# Patient Record
Sex: Male | Born: 1960
Health system: Southern US, Community
[De-identification: ages and names within clinical notes are randomized; demographics above are authoritative.]

## PROBLEM LIST (undated history)

## (undated) DIAGNOSIS — I509 Heart failure, unspecified: Secondary | ICD-10-CM

## (undated) DIAGNOSIS — K219 Gastro-esophageal reflux disease without esophagitis: Secondary | ICD-10-CM

## (undated) DIAGNOSIS — F17200 Nicotine dependence, unspecified, uncomplicated: Secondary | ICD-10-CM

## (undated) DIAGNOSIS — E8881 Metabolic syndrome: Secondary | ICD-10-CM

## (undated) DIAGNOSIS — G4730 Sleep apnea, unspecified: Secondary | ICD-10-CM

## (undated) DIAGNOSIS — Z87442 Personal history of urinary calculi: Secondary | ICD-10-CM

## (undated) DIAGNOSIS — I219 Acute myocardial infarction, unspecified: Secondary | ICD-10-CM

## (undated) DIAGNOSIS — E785 Hyperlipidemia, unspecified: Secondary | ICD-10-CM

## (undated) DIAGNOSIS — Z95810 Presence of automatic (implantable) cardiac defibrillator: Secondary | ICD-10-CM

## (undated) DIAGNOSIS — I2510 Atherosclerotic heart disease of native coronary artery without angina pectoris: Secondary | ICD-10-CM

## (undated) DIAGNOSIS — Z972 Presence of dental prosthetic device (complete) (partial): Secondary | ICD-10-CM

## (undated) HISTORY — PX: CARDIAC CATHETERIZATION: SHX172

---

## 2013-12-28 ENCOUNTER — Emergency Department (HOSPITAL_COMMUNITY): Payer: BC Managed Care – PPO

## 2013-12-28 ENCOUNTER — Inpatient Hospital Stay (HOSPITAL_COMMUNITY)
Admission: EM | Admit: 2013-12-28 | Discharge: 2013-12-30 | DRG: 247 | Disposition: A | Discharge status: Home / Self Care | Payer: BC Managed Care – PPO | Attending: Internal Medicine | Admitting: Internal Medicine

## 2013-12-28 ENCOUNTER — Encounter (HOSPITAL_COMMUNITY): Payer: Self-pay | Admitting: Emergency Medicine

## 2013-12-28 DIAGNOSIS — K219 Gastro-esophageal reflux disease without esophagitis: Secondary | ICD-10-CM | POA: Diagnosis present

## 2013-12-28 DIAGNOSIS — R9431 Abnormal electrocardiogram [ECG] [EKG]: Secondary | ICD-10-CM | POA: Diagnosis present

## 2013-12-28 DIAGNOSIS — R079 Chest pain, unspecified: Secondary | ICD-10-CM

## 2013-12-28 DIAGNOSIS — Z955 Presence of coronary angioplasty implant and graft: Secondary | ICD-10-CM

## 2013-12-28 DIAGNOSIS — I200 Unstable angina: Secondary | ICD-10-CM | POA: Diagnosis present

## 2013-12-28 DIAGNOSIS — Z833 Family history of diabetes mellitus: Secondary | ICD-10-CM

## 2013-12-28 DIAGNOSIS — F17200 Nicotine dependence, unspecified, uncomplicated: Secondary | ICD-10-CM | POA: Diagnosis present

## 2013-12-28 DIAGNOSIS — I2510 Atherosclerotic heart disease of native coronary artery without angina pectoris: Principal | ICD-10-CM | POA: Diagnosis not present

## 2013-12-28 DIAGNOSIS — I519 Heart disease, unspecified: Secondary | ICD-10-CM | POA: Diagnosis present

## 2013-12-28 DIAGNOSIS — E8881 Metabolic syndrome: Secondary | ICD-10-CM | POA: Diagnosis present

## 2013-12-28 DIAGNOSIS — Z7982 Long term (current) use of aspirin: Secondary | ICD-10-CM

## 2013-12-28 DIAGNOSIS — Z9861 Coronary angioplasty status: Secondary | ICD-10-CM | POA: Diagnosis not present

## 2013-12-28 DIAGNOSIS — D72829 Elevated white blood cell count, unspecified: Secondary | ICD-10-CM | POA: Diagnosis present

## 2013-12-28 DIAGNOSIS — Z8249 Family history of ischemic heart disease and other diseases of the circulatory system: Secondary | ICD-10-CM

## 2013-12-28 DIAGNOSIS — Z720 Tobacco use: Secondary | ICD-10-CM | POA: Diagnosis present

## 2013-12-28 DIAGNOSIS — Z823 Family history of stroke: Secondary | ICD-10-CM

## 2013-12-28 DIAGNOSIS — E785 Hyperlipidemia, unspecified: Secondary | ICD-10-CM | POA: Diagnosis present

## 2013-12-28 HISTORY — DX: Gastro-esophageal reflux disease without esophagitis: K21.9

## 2013-12-28 HISTORY — DX: Metabolic syndrome: E88.81

## 2013-12-28 HISTORY — DX: Atherosclerotic heart disease of native coronary artery without angina pectoris: I25.10

## 2013-12-28 HISTORY — DX: Hyperlipidemia, unspecified: E78.5

## 2013-12-28 LAB — BASIC METABOLIC PANEL
BUN: 11 mg/dL (ref 6–23)
CALCIUM: 9.2 mg/dL (ref 8.4–10.5)
CO2: 23 mEq/L (ref 19–32)
Chloride: 105 mEq/L (ref 96–112)
Creatinine, Ser: 0.97 mg/dL (ref 0.50–1.35)
GFR calc Af Amer: >90 mL/min (ref >90)
Glucose, Bld: 104 mg/dL — ABNORMAL HIGH (ref 70–99)
POTASSIUM: 3.7 meq/L (ref 3.7–5.3)
SODIUM: 142 meq/L (ref 137–147)

## 2013-12-28 LAB — TROPONIN I
Troponin I: <0.3 ng/mL (ref <0.30)
Troponin I: <0.3 ng/mL (ref <0.30)

## 2013-12-28 LAB — DIFFERENTIAL
BASOS ABS: 0 10*3/uL (ref 0.0–0.1)
BASOS PCT: 0 % (ref 0–1)
EOS ABS: 0.3 10*3/uL (ref 0.0–0.7)
EOS PCT: 2 % (ref 0–5)
Lymphocytes Relative: 38 % (ref 12–46)
Lymphs Abs: 5 10*3/uL — ABNORMAL HIGH (ref 0.7–4.0)
MONO ABS: 0.9 10*3/uL (ref 0.1–1.0)
MONOS PCT: 7 % (ref 3–12)
Neutro Abs: 6.8 10*3/uL (ref 1.7–7.7)
Neutrophils Relative %: 53 % (ref 43–77)

## 2013-12-28 LAB — CK TOTAL AND CKMB (NOT AT ARMC)
CK TOTAL: 43 U/L (ref 7–232)
CK, MB: 1.7 ng/mL (ref 0.3–4.0)
Relative Index: INVALID (ref 0.0–2.5)

## 2013-12-28 LAB — I-STAT TROPONIN, ED: TROPONIN I, POC: 0.11 ng/mL — AB (ref 0.00–0.08)

## 2013-12-28 LAB — CBC
HCT: 45.5 % (ref 39.0–52.0)
Hemoglobin: 15.9 g/dL (ref 13.0–17.0)
MCH: 31.5 pg (ref 26.0–34.0)
MCHC: 34.9 g/dL (ref 30.0–36.0)
MCV: 90.1 fL (ref 78.0–100.0)
Platelets: 224 10*3/uL (ref 150–400)
RBC: 5.05 MIL/uL (ref 4.22–5.81)
RDW: 13.7 % (ref 11.5–15.5)
WBC: 11.9 10*3/uL — ABNORMAL HIGH (ref 4.0–10.5)

## 2013-12-28 LAB — PRO B NATRIURETIC PEPTIDE: Pro B Natriuretic peptide (BNP): 961.1 pg/mL — ABNORMAL HIGH (ref 0–125)

## 2013-12-28 LAB — APTT: APTT: 31 s (ref 24–37)

## 2013-12-28 LAB — HEPARIN LEVEL (UNFRACTIONATED): HEPARIN UNFRACTIONATED: 0.28 [IU]/mL — AB (ref 0.30–0.70)

## 2013-12-28 LAB — PROTIME-INR
INR: 1.02 (ref 0.00–1.49)
PROTHROMBIN TIME: 13.2 s (ref 11.6–15.2)

## 2013-12-28 MED ORDER — ASPIRIN EC 325 MG PO TBEC
325.0000 mg | DELAYED_RELEASE_TABLET | Freq: Once | ORAL | Status: AC
  Administered 2013-12-28: 325 mg via ORAL
  Filled 2013-12-28: qty 1

## 2013-12-28 MED ORDER — ACETAMINOPHEN 325 MG PO TABS: 650.0000 mg | ORAL_TABLET | ORAL | Status: DC | PRN

## 2013-12-28 MED ORDER — ALPRAZOLAM 0.25 MG PO TABS: 0.2500 mg | ORAL_TABLET | Freq: Two times a day (BID) | ORAL | Status: DC | PRN

## 2013-12-28 MED ORDER — SODIUM CHLORIDE 0.9 % IJ SOLN: 3.0000 mL | Freq: Two times a day (BID) | INTRAMUSCULAR | Status: DC

## 2013-12-28 MED ORDER — HEPARIN SODIUM (PORCINE) 5000 UNIT/ML IJ SOLN
4000.0000 [IU] | Freq: Once | INTRAMUSCULAR | Status: DC
  Filled 2013-12-28: qty 1

## 2013-12-28 MED ORDER — NITROGLYCERIN 0.4 MG SL SUBL
0.4000 mg | SUBLINGUAL_TABLET | SUBLINGUAL | Status: DC | PRN
Start: 2013-12-28 — End: 2013-12-30

## 2013-12-28 MED ORDER — NITROGLYCERIN 0.4 MG SL SUBL
0.4000 mg | SUBLINGUAL_TABLET | SUBLINGUAL | Status: DC | PRN
  Administered 2013-12-28 (×2): 0.4 mg via SUBLINGUAL
  Filled 2013-12-28: qty 1

## 2013-12-28 MED ORDER — SODIUM CHLORIDE 0.9 % IJ SOLN: 3.0000 mL | INTRAMUSCULAR | Status: DC | PRN

## 2013-12-28 MED ORDER — ONDANSETRON HCL 4 MG/2ML IJ SOLN: 4.0000 mg | Freq: Four times a day (QID) | INTRAMUSCULAR | Status: DC | PRN

## 2013-12-28 MED ORDER — PANTOPRAZOLE SODIUM 40 MG PO TBEC
80.0000 mg | DELAYED_RELEASE_TABLET | Freq: Two times a day (BID) | ORAL | Status: DC
  Administered 2013-12-28 – 2013-12-30 (×4): 80 mg via ORAL
  Filled 2013-12-28 (×4): qty 2

## 2013-12-28 MED ORDER — ZOLPIDEM TARTRATE 5 MG PO TABS: 5.0000 mg | ORAL_TABLET | Freq: Every evening | ORAL | Status: DC | PRN

## 2013-12-28 MED ORDER — HYDROCODONE-ACETAMINOPHEN 5-325 MG PO TABS
1.0000 | ORAL_TABLET | ORAL | Status: DC | PRN
  Administered 2013-12-28: 1 via ORAL
  Filled 2013-12-28: qty 1

## 2013-12-28 MED ORDER — HEPARIN (PORCINE) IN NACL 100-0.45 UNIT/ML-% IJ SOLN
1400.0000 [IU]/h | INTRAMUSCULAR | Status: DC
  Administered 2013-12-28: 1200 [IU]/h via INTRAVENOUS
  Administered 2013-12-29: 1400 [IU]/h via INTRAVENOUS
  Filled 2013-12-28 (×4): qty 250

## 2013-12-28 MED ORDER — SODIUM CHLORIDE 0.9 % IV SOLN: 250.0000 mL | INTRAVENOUS | Status: DC | PRN

## 2013-12-28 MED ORDER — CARVEDILOL 3.125 MG PO TABS
3.1250 mg | ORAL_TABLET | Freq: Two times a day (BID) | ORAL | Status: DC
  Administered 2013-12-29 – 2013-12-30 (×2): 3.125 mg via ORAL
  Filled 2013-12-28 (×5): qty 1

## 2013-12-28 MED ORDER — NICOTINE 14 MG/24HR TD PT24
14.0000 mg | MEDICATED_PATCH | Freq: Every day | TRANSDERMAL | Status: DC
  Administered 2013-12-28 – 2013-12-30 (×3): 14 mg via TRANSDERMAL
  Filled 2013-12-28 (×3): qty 1

## 2013-12-28 MED ORDER — ATORVASTATIN CALCIUM 40 MG PO TABS
40.0000 mg | ORAL_TABLET | Freq: Every day | ORAL | Status: DC
  Filled 2013-12-28 (×2): qty 1

## 2013-12-28 MED ORDER — HEPARIN BOLUS VIA INFUSION
4000.0000 [IU] | Freq: Once | INTRAVENOUS | Status: AC
  Administered 2013-12-28: 4000 [IU] via INTRAVENOUS
  Filled 2013-12-28: qty 4000

## 2013-12-28 MED ORDER — ASPIRIN EC 81 MG PO TBEC
81.0000 mg | DELAYED_RELEASE_TABLET | Freq: Every day | ORAL | Status: DC
  Filled 2013-12-28: qty 1

## 2013-12-28 MED ORDER — HEPARIN (PORCINE) IN NACL 100-0.45 UNIT/ML-% IJ SOLN: 12.0000 [IU]/kg/h | INTRAMUSCULAR | Status: DC

## 2013-12-28 NOTE — ED Provider Notes (Signed)
CSN: [id number]     Arrival date & time 12/28/13  1250 History   None    Chief Complaint  Patient presents with  . Chest Pain     (Consider location/radiation/quality/duration/timing/severity/associated sxs/prior Treatment) Patient is a 53 y.o. male presenting with chest pain. The history is provided by the patient.  Chest Pain Pain location:  Substernal area Pain quality: pressure   Pain radiates to:  Does not radiate Pain radiates to the back: no   Pain severity:  Mild Onset quality:  Gradual Duration:  3 days Timing:  Intermittent Progression:  Worsening Chronicity:  New Context: movement and at rest   Context: not breathing, no stress and no trauma   Relieved by:  Nothing Worsened by:  Exertion Ineffective treatments:  None tried Associated symptoms: shortness of breath   Associated symptoms: no abdominal pain, no back pain, no cough, no dizziness, no fever, no lower extremity edema, no nausea, no numbness, no syncope, not vomiting and no weakness   Risk factors: male sex, obesity and smoking   Risk factors: no aortic disease, no coronary artery disease, no diabetes mellitus and no prior DVT/PE     Past Medical History  Diagnosis Date  . GERD (gastroesophageal reflux disease)    Past Surgical History  Procedure Laterality Date  . None     Family History  Problem Relation Age of Onset  . Heart attack Brother     Deceased  . Heart attack Brother   . Stroke Sister   . Diabetes Father   . Diabetes Mother   . Cancer Father     Deceased  . Cancer Mother     Deceased   History  Substance Use Topics  . Smoking status: Current Every Day Smoker -- 1.00 packs/day for 40 years    Types: Cigarettes  . Smokeless tobacco: Never Used  . Alcohol Use: Yes     Comment: "Once in a blue moon"    Review of Systems  Constitutional: Negative for fever, activity change and appetite change.  HENT: Negative for congestion and rhinorrhea.   Eyes: Negative for discharge and  itching.  Respiratory: Positive for shortness of breath. Negative for cough and wheezing.   Cardiovascular: Positive for chest pain. Negative for syncope.  Gastrointestinal: Negative for nausea, vomiting, abdominal pain, diarrhea and constipation.  Genitourinary: Negative for hematuria, decreased urine volume and difficulty urinating.  Musculoskeletal: Negative for back pain.  Skin: Negative for rash and wound.  Neurological: Negative for dizziness, syncope, weakness and numbness.  All other systems reviewed and are negative.      Allergies  Review of patient's allergies indicates no known allergies.  Home Medications   Current Outpatient Rx  Name  Route  Sig  Dispense  Refill  . aspirin EC 325 MG tablet   Oral   Take 325 mg by mouth daily.         Marland Kitchen esomeprazole (NEXIUM) 20 MG capsule   Oral   Take 20 mg by mouth daily at 12 noon.         Marland Kitchen ibuprofen (ADVIL,MOTRIN) 200 MG tablet   Oral   Take 400 mg by mouth every 6 (six) hours as needed for mild pain.          BP 122/77  Pulse 76  Temp(Src) 98.3 F (36.8 C) (Oral)  Resp 15  Ht 6' (1.829 m)  Wt 212 lb (96.163 kg)  BMI 28.75 kg/m2  SpO2 99% Physical Exam  Vitals reviewed. Constitutional:  He is oriented to person, place, and time. He appears well-developed and well-nourished. No distress.  HENT:  Head: Normocephalic and atraumatic.  Mouth/Throat: Oropharynx is clear and moist. No oropharyngeal exudate.  Eyes: Conjunctivae and EOM are normal. Pupils are equal, round, and reactive to light. Right eye exhibits no discharge. Left eye exhibits no discharge. No scleral icterus.  Neck: Normal range of motion. Neck supple.  Cardiovascular: Normal rate, regular rhythm, normal heart sounds and intact distal pulses.  Exam reveals no gallop and no friction rub.   No murmur heard. Pulmonary/Chest: Effort normal and breath sounds normal. No respiratory distress. He has no wheezes. He has no rales.  Abdominal: Soft. He  exhibits no distension and no mass. There is no tenderness.  Musculoskeletal: Normal range of motion.  Neurological: He is alert and oriented to person, place, and time. No cranial nerve deficit. He exhibits normal muscle tone. Coordination normal.  Skin: Skin is warm. No rash noted. He is not diaphoretic.    ED Course  Procedures (including critical care time) Labs Review Labs Reviewed  CBC - Abnormal; Notable for the following:    WBC 11.9 (*)    All other components within normal limits  BASIC METABOLIC PANEL - Abnormal; Notable for the following:    Glucose, Bld 104 (*)    All other components within normal limits  PRO B NATRIURETIC PEPTIDE - Abnormal; Notable for the following:    Pro B Natriuretic peptide (BNP) 961.1 (*)    All other components within normal limits  DIFFERENTIAL - Abnormal; Notable for the following:    Lymphs Abs 5.0 (*)    All other components within normal limits  I-STAT TROPOININ, ED - Abnormal; Notable for the following:    Troponin i, poc 0.11 (*)    All other components within normal limits  TROPONIN I  APTT  PROTIME-INR  HEPARIN LEVEL (UNFRACTIONATED)  HEPARIN LEVEL (UNFRACTIONATED)  CBC  CK TOTAL AND CKMB  SEDIMENTATION RATE   Imaging Review No results found.   EKG Interpretation   Date/Time:  Monday December 28 2013 13:02:29 EDT Ventricular Rate:  79 PR Interval:  184 QRS Duration: 98 QT Interval:  386 QTC Calculation: 442 R Axis:   68 Text Interpretation:  Normal sinus rhythm ST \\T \ T wave abnormality,  consider anterolateral ischemia Abnormal ECG Sinus rhythm ST-t wave  abnormality Abnormal ekg Confirmed by Gerhard MunchLOCKWOOD, ROBERT  MD [id number]) on  12/28/2013 3:41:10 PM      MDM   MDM: 53 y.o. WM w/ no PMHx, but doesn't see doctor with chest pain. 4 days off and on, substernal pressure. SOB, no n/v or diaphoresis. States worse with a ctivity. No hx of similar. AFVSS, well appearing, having pain. No signs or sxs of DVT, no hx of PE, no  hemoptysis. EKG with ST depressions in V2-V4 and TWI. Trop elevated on Istat. NSTEMI. Will give ASA, NTG, and heparin. Admit to cards. Pt remained HDS while in ED. Care of case d/w my attending.  Final diagnoses:  Chest pain    Admit to Cardiology  Pilar Jarvisoug Sussan Meter, MD 12/28/13 2032

## 2013-12-28 NOTE — Progress Notes (Signed)
ANTICOAGULATION CONSULT NOTE - Initial Consult  Pharmacy Consult for heparin Indication: chest pain/ACS  No Known Allergies  Patient Measurements: Height: 6' (182.9 cm) Weight: 212 lb (96.163 kg) IBW/kg (Calculated) : 77.6 Heparin Dosing Weight: 96kg  Vital Signs: Temp: 98.3 F (36.8 C) (03/30 1536) Temp src: Oral (03/30 1536) BP: 117/87 mmHg (03/30 1536) Pulse Rate: 74 (03/30 1536)  Labs:  Recent Labs  12/28/13 1302  HGB 15.9  HCT 45.5  PLT 224  CREATININE 0.97    Estimated Creatinine Clearance: 104.7 ml/min (by C-G formula based on Cr of 0.97).   Medical History: History reviewed. No pertinent past medical history.  Assessment: 68 YOM who has been having midsternal chest pain and heaviness in his arms since Friday. No cardiac history. Patient has never been on an anticoagulant. No bleeding or bruising recently. Baseline Hgb 15.9, plts 224.  Goal of Therapy:  Heparin level 0.3-0.7 units/ml Monitor platelets by anticoagulation protocol: Yes   Plan:  1. Stat aPTT and INR for baseline values per protocol 2. Heparin bolus with 4000 units IV x1 3. Start heparin drip at 1200 units/hr 4. Heparin level in 6 hours 5. Daily heparin level and CBC 6. Follow for s/s bleeding, cath plans  Stasia Somero D. Ty Buntrock, PharmD, BCPS Clinical Pharmacist Pager: [phone] 12/28/2013 4:02 PM

## 2013-12-28 NOTE — ED Notes (Signed)
Pt reports that he started having midsternal chest pain and heaviness in his arms on Friday. States that he has feel SOB, denies any n/v. Denies any cardiac hx.

## 2013-12-28 NOTE — H&P (Signed)
Pt. Seen and examined. Agree with the NP/PA-C note as written.  Pleasant 53 yo male with significant family history of premature CAD and long-standing smoking history. He does not have a primary care doctor.  He has a poor diet and works 7 days a week in a fairly sedentary job. He presents with several days of intermittent chest pressure, band-like across the chest, heaviness in both arms, fatigue and lack of energy. He also describes DOE. His symptoms are worse when laying down and improve when sitting up.  He was given aspirin a few days ago which helped his pain, but it recurred. He took the day off and asked his wife to take him to the ER.  He was found to have mild anterolateral ST depression and TWI's. Troponins have been negative. He also reports chest pain on palpation, that is somewhat worse with deep breathing. He said that when examining his cardiac apex, lifting the left breast up relieved some of his pain. I do not appreciate mass or infection in the left breast on exam.  He does have subcutaneous mass on his right back, under the scapula, which is likely a lipoma and has been there for a while. He did receive 2 sublingual nitro in the ER with some improvement in his chest pressure.  CXR show hyperinflation with no acute findings. BNP is elevated at 961 and there is a mild leukocytosis.  Impression: 1.  Chest pain - typical and atypical features, DDX includes: unstable angina, musculoskeletal chest pain, pericarditis, pleurisy, chostochondritis 2.  Abnormal EKG, suggestive of ischemia  Recommend: 1.  I would start with a 2D echo to evaluate for structural abnormality or pericardial fluid - heart size is normal, therefore less suspicious of a large effusion.  Would give aspirin to see if he has some symptomatic relief, which could suggest pericarditis. Check ESR and CBC with diff tomorrow. 2.  Treat for possible unstable angina as well, heparin and nitroglycerin.  Keep NPO p MN for cath  tomorrow. 3.  If echo is unrevealing, then left heart cath would be the next recommendation.  Pixie Casino, MD, [facility] Attending Cardiologist [place]

## 2013-12-28 NOTE — Progress Notes (Signed)
ANTICOAGULATION CONSULT NOTE   Pharmacy Consult for heparin Indication: chest pain/ACS  No Known Allergies  Patient Measurements: Height: 6' (182.9 cm) Weight: 209 lb 3.2 oz (94.892 kg) IBW/kg (Calculated) : 77.6 Heparin Dosing Weight: 96kg  Vital Signs: Temp: 97.7 F (36.5 C) (03/30 2120) Temp src: Oral (03/30 2120) BP: 120/80 mmHg (03/30 2120) Pulse Rate: 70 (03/30 2120)  Labs:  Recent Labs  12/28/13 1302 12/28/13 1617 12/28/13 1643 12/28/13 1822 12/28/13 2210  HGB 15.9  --   --   --   --   HCT 45.5  --   --   --   --   PLT 224  --   --   --   --   APTT  --   --  31  --   --   LABPROT  --   --  13.2  --   --   INR  --   --  1.02  --   --   HEPARINUNFRC  --   --   --   --  0.28*  CREATININE 0.97  --   --   --   --   CKTOTAL  --   --   --  43  --   CKMB  --   --   --  1.7  --   TROPONINI  --  <0.30  --   --   --     Estimated Creatinine Clearance: 104.1 ml/min (by C-G formula based on Cr of 0.97).   Medical History: Past Medical History  Diagnosis Date  . GERD (gastroesophageal reflux disease)     Assessment: 3 YOM who has been having midsternal chest pain and heaviness in his arms since Friday. No cardiac history. Patient has never been on an anticoagulant. No bleeding or bruising recently. Baseline Hgb 15.9, plts 224. Initial heparin level is slightly subtherapeutic at 0.28 units/ml  Goal of Therapy:  Heparin level 0.3-0.7 units/ml Monitor platelets by anticoagulation protocol: Yes   Plan:  1. Increase heparin drip to 1400 units/hr 2. Heparin level in 6 hours  Talbert Cage, PharmD Clinical Pharmacist Pager: [phone] 12/28/2013 10:55 PM

## 2013-12-28 NOTE — H&P (Signed)
History and Physical   Patient ID: Alan White MRN: [id number], DOB/AGE: 02/03/1961 53 y.o. 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 male with no history of CAD. CRFs are tobacco, FH. No known history of HTN, HL, DM.   He presents today with a 4 day history of intermittent chest pressure. It is lef/substernal, radiating to both arms. It was 8/10 at its worst. It has occurred at rest and with exertion. He tried OTC PPI and Tums, without relief. One time, he took ASA which helped. It seems to happen more often in the afternoon/evening. It has also woken him from sleep. He had SOB with it it if occurred with exertion, but also describes DOE. He has had no N&V or diaphoresis. He told his wife about it 3 days ago, has had multiple episodes. He took a day off from work and came to the ER because it was scaring him.  It the ER, he has rec'd SL NTG x 2, ASA 325 mg and heparin. He is currently having some pressure, 3/10, but the pain has improved.   Past Medical History  Diagnosis Date  . GERD (gastroesophageal reflux disease)    Past Surgical History  Procedure Laterality Date  . None     I have reviewed the patient's current medications. Prior to Admission medications   Medication Sig Start Date End Date Taking? Authorizing Provider  aspirin EC 325 MG tablet Take 325 mg by mouth daily.   Yes Historical Provider, MD  esomeprazole (NEXIUM) 20 MG capsule Take 20 mg by mouth daily at 12 noon.   Yes Historical Provider, MD  ibuprofen (ADVIL,MOTRIN) 200 MG tablet Take 400 mg by mouth every 6 (six) hours as needed for mild pain.   Yes Historical Provider, MD   Scheduled Meds:  Continuous Infusions: . heparin 1,200 Units/hr (12/28/13 1640)   PRN Meds:.nitroGLYCERIN  Allergies: No Known Allergies  History   Social History  .  Marital Status: Married    Spouse Name:      Number of Children: N/A  . Years of Education: N/A   Occupational History  . Manager [employer]   Social History Main Topics  . Smoking status: Current Every Day Smoker -- 1.00 packs/day for 40 years    Types: Cigarettes  . Smokeless tobacco: Never Used  . Alcohol Use: Yes     Comment: "Once in a blue moon"  . Drug Use: No  . Sexual Activity: Not on file   Other Topics Concern  . Not on file   Social History Narrative   Pt lives with wife.    Family History  Problem Relation Age of Onset  . Heart attack Brother     Deceased  . Heart attack Brother   . Stroke Sister   . Diabetes Father   . Diabetes Mother   . Cancer Father     Deceased  . Cancer Mother     Deceased    Review of Systems: Has trouble with his teeth, has had infections and some teeth pulled.  Full 14-point review of systems otherwise negative except as noted above.  Physical Exam: Blood pressure 104/69, pulse 71, temperature 98.3 F (36.8 C), temperature source Oral, resp. rate 15, height 6' (1.829 m), weight 212 lb (96.163 kg), SpO2 99.00%. General: Well developed,  well nourished,male in no acute distress. Head: Normocephalic, atraumatic, sclera non-icteric, no xanthomas, nares are without discharge. Dentition: poor Neck: No carotid bruits. JVD not elevated. No thyromegally Lungs: Good expansion bilaterally. without wheezes or rhonchi.  Heart: Regular rate and rhythm with S1 S2.  No S3 or S4.  No murmur, no rubs, or gallops appreciated. Abdomen: Soft, non-tender, non-distended with normoactive bowel sounds. No hepatomegaly. No rebound/guarding. No obvious abdominal masses. Msk:  Strength and tone appear normal for age. No joint deformities or effusions, no spine or costo-vertebral angle tenderness. Extremities: No clubbing or cyanosis. No edema.  Distal pedal pulses are 2+ in 4 extrem Neuro: Alert and oriented X 3. Moves all extremities  spontaneously. No focal deficits noted. Psych:  Responds to questions appropriately with a normal affect. Skin: No rashes or lesions noted  Labs: Lab Results  Component Value Date   WBC 11.9* 12/28/2013   HGB 15.9 12/28/2013   HCT 45.5 12/28/2013   MCV 90.1 12/28/2013   PLT 224 12/28/2013    Recent Labs  12/28/13 1643  INR 1.02     Recent Labs Lab 12/28/13 1302  NA 142  K 3.7  CL 105  CO2 23  BUN 11  CREATININE 0.97  CALCIUM 9.2  GLUCOSE 104*    Recent Labs  12/28/13 1617  TROPONINI <0.30    Recent Labs  12/28/13 1326  TROPIPOC 0.11*    Pro B Natriuretic peptide (BNP)  Date/Time Value Ref Range Status  12/28/2013  1:02 PM 961.1* 0 - 125 pg/mL Final   Radiology/Studies:  No results found.    ECG: SR, 79, lateral ST changes, No old in system  ASSESSMENT AND PLAN:  Principal Problem:   Intermediate coronary syndrome - admit, continue heparin, nitrates and ASA. Add empiric statin, low-dose BB (SBP 154 before NTG). ECG is abnl, no old to compare. Consider cath to evaluate pain as he has multiple CRFs and they are uncontrolled.  Active Problems:   Tobacco use - discussed cessation, add nicotine patch    GERD - at BID PPI.    Caffeine use - he drinks 48 cans of Mtn Dew per week, will use PRN Vicodin while in hospital to help with w-d symptoms.   Melida QuitterSigned, Rhonda Barrett, PA-C 12/28/2013 6:09 PM Beeper [phone]

## 2013-12-29 ENCOUNTER — Other Ambulatory Visit: Payer: Self-pay

## 2013-12-29 ENCOUNTER — Encounter (HOSPITAL_COMMUNITY)
Admission: EM | Disposition: A | Discharge status: Home / Self Care | Payer: BC Managed Care – PPO | Source: Home / Self Care | Attending: Internal Medicine

## 2013-12-29 DIAGNOSIS — I200 Unstable angina: Secondary | ICD-10-CM

## 2013-12-29 DIAGNOSIS — I2510 Atherosclerotic heart disease of native coronary artery without angina pectoris: Principal | ICD-10-CM

## 2013-12-29 DIAGNOSIS — I517 Cardiomegaly: Secondary | ICD-10-CM

## 2013-12-29 HISTORY — PX: LEFT HEART CATHETERIZATION WITH CORONARY ANGIOGRAM: SHX5451

## 2013-12-29 HISTORY — PX: PERCUTANEOUS CORONARY STENT INTERVENTION (PCI-S): SHX5485

## 2013-12-29 HISTORY — PX: CORONARY ANGIOPLASTY WITH STENT PLACEMENT: SHX49

## 2013-12-29 LAB — COMPREHENSIVE METABOLIC PANEL
ALBUMIN: 3 g/dL — AB (ref 3.5–5.2)
ALT: 11 U/L (ref 0–53)
AST: 12 U/L (ref 0–37)
Alkaline Phosphatase: 100 U/L (ref 39–117)
BUN: 15 mg/dL (ref 6–23)
CALCIUM: 8.8 mg/dL (ref 8.4–10.5)
CO2: 24 mEq/L (ref 19–32)
CREATININE: 1.23 mg/dL (ref 0.50–1.35)
Chloride: 105 mEq/L (ref 96–112)
GFR calc Af Amer: 75 mL/min — ABNORMAL LOW (ref >90)
GFR, EST NON AFRICAN AMERICAN: 65 mL/min — AB (ref >90)
Glucose, Bld: 98 mg/dL (ref 70–99)
Potassium: 3.8 mEq/L (ref 3.7–5.3)
Sodium: 141 mEq/L (ref 137–147)
Total Bilirubin: 0.4 mg/dL (ref 0.3–1.2)
Total Protein: 6 g/dL (ref 6.0–8.3)

## 2013-12-29 LAB — SEDIMENTATION RATE
Sed Rate: 1 mm/hr (ref 0–16)
Sed Rate: 5 mm/h (ref 0–16)

## 2013-12-29 LAB — LIPID PANEL
Cholesterol: 143 mg/dL (ref 0–200)
HDL: 21 mg/dL — AB (ref >39)
LDL Cholesterol: 95 mg/dL (ref 0–99)
Total CHOL/HDL Ratio: 6.8 RATIO
Triglycerides: 136 mg/dL (ref <150)
VLDL: 27 mg/dL (ref 0–40)

## 2013-12-29 LAB — CBC
HCT: 40.6 % (ref 39.0–52.0)
Hemoglobin: 14.2 g/dL (ref 13.0–17.0)
MCH: 31.6 pg (ref 26.0–34.0)
MCHC: 35 g/dL (ref 30.0–36.0)
MCV: 90.4 fL (ref 78.0–100.0)
Platelets: 218 10*3/uL (ref 150–400)
RBC: 4.49 MIL/uL (ref 4.22–5.81)
RDW: 13.5 % (ref 11.5–15.5)
WBC: 12.9 10*3/uL — ABNORMAL HIGH (ref 4.0–10.5)

## 2013-12-29 LAB — TROPONIN I

## 2013-12-29 LAB — HEMOGLOBIN A1C
Hgb A1c MFr Bld: 6 % — ABNORMAL HIGH (ref <5.7)
MEAN PLASMA GLUCOSE: 126 mg/dL — AB (ref <117)

## 2013-12-29 LAB — C-REACTIVE PROTEIN: CRP: 0.7 mg/dL — ABNORMAL HIGH (ref <0.60)

## 2013-12-29 LAB — POCT ACTIVATED CLOTTING TIME: ACTIVATED CLOTTING TIME: 764 s

## 2013-12-29 LAB — TSH: TSH: 1.653 u[IU]/mL (ref 0.350–4.500)

## 2013-12-29 LAB — HEPARIN LEVEL (UNFRACTIONATED)
HEPARIN UNFRACTIONATED: 0.33 [IU]/mL (ref 0.30–0.70)
Heparin Unfractionated: 0.42 IU/mL (ref 0.30–0.70)

## 2013-12-29 SURGERY — LEFT HEART CATHETERIZATION WITH CORONARY ANGIOGRAM
Anesthesia: LOCAL

## 2013-12-29 MED ORDER — PENICILLIN V POTASSIUM 250 MG/5ML PO SOLR
125.0000 mg | Freq: Two times a day (BID) | ORAL | Status: DC
  Filled 2013-12-29 (×2): qty 2.5

## 2013-12-29 MED ORDER — NITROGLYCERIN 0.2 MG/ML ON CALL CATH LAB
INTRAVENOUS | Status: AC
Start: 2013-12-29 — End: 2013-12-29
  Filled 2013-12-29: qty 1

## 2013-12-29 MED ORDER — SODIUM CHLORIDE 0.9 % IV SOLN
1.0000 mL/kg/h | INTRAVENOUS | Status: DC
  Administered 2013-12-29: 1 mL/kg/h via INTRAVENOUS

## 2013-12-29 MED ORDER — SODIUM CHLORIDE 0.9 % IJ SOLN
3.0000 mL | Freq: Two times a day (BID) | INTRAMUSCULAR | Status: DC
Start: 2013-12-29 — End: 2013-12-29

## 2013-12-29 MED ORDER — TICAGRELOR 90 MG PO TABS
ORAL_TABLET | ORAL | Status: AC
Start: 2013-12-29 — End: 2013-12-29
  Filled 2013-12-29: qty 1

## 2013-12-29 MED ORDER — HEPARIN SODIUM (PORCINE) 1000 UNIT/ML IJ SOLN
INTRAMUSCULAR | Status: AC
  Filled 2013-12-29: qty 1

## 2013-12-29 MED ORDER — MIDAZOLAM HCL 2 MG/2ML IJ SOLN
INTRAMUSCULAR | Status: AC
  Filled 2013-12-29: qty 2

## 2013-12-29 MED ORDER — ASPIRIN 81 MG PO CHEW
CHEWABLE_TABLET | ORAL | Status: AC
  Administered 2013-12-29: 81 mg via ORAL
  Filled 2013-12-29: qty 1

## 2013-12-29 MED ORDER — BIVALIRUDIN 250 MG IV SOLR
0.2500 mg/kg/h | INTRAVENOUS | Status: AC
  Filled 2013-12-29: qty 250

## 2013-12-29 MED ORDER — SODIUM CHLORIDE 0.9 % IV SOLN
0.2500 mg/kg/h | INTRAVENOUS | Status: DC
  Filled 2013-12-29 (×2): qty 250

## 2013-12-29 MED ORDER — SODIUM CHLORIDE 0.9 % IV SOLN: 250.0000 mL | INTRAVENOUS | Status: DC | PRN

## 2013-12-29 MED ORDER — SODIUM CHLORIDE 0.9 % IV SOLN
INTRAVENOUS | Status: AC
Start: 2013-12-29 — End: 2013-12-29
  Administered 2013-12-29: 18:00:00 via INTRAVENOUS

## 2013-12-29 MED ORDER — ASPIRIN 81 MG PO CHEW
81.0000 mg | CHEWABLE_TABLET | ORAL | Status: AC
  Administered 2013-12-29: 81 mg via ORAL

## 2013-12-29 MED ORDER — HEPARIN (PORCINE) IN NACL 2-0.9 UNIT/ML-% IJ SOLN
INTRAMUSCULAR | Status: AC
  Filled 2013-12-29: qty 1000

## 2013-12-29 MED ORDER — BIVALIRUDIN 250 MG IV SOLR
INTRAVENOUS | Status: AC
  Filled 2013-12-29: qty 250

## 2013-12-29 MED ORDER — FENTANYL CITRATE 0.05 MG/ML IJ SOLN
INTRAMUSCULAR | Status: AC
  Filled 2013-12-29: qty 2

## 2013-12-29 MED ORDER — SODIUM CHLORIDE 0.9 % IJ SOLN: 3.0000 mL | INTRAMUSCULAR | Status: DC | PRN

## 2013-12-29 MED ORDER — TICAGRELOR 90 MG PO TABS
90.0000 mg | ORAL_TABLET | Freq: Two times a day (BID) | ORAL | Status: DC
Start: 2013-12-29 — End: 2013-12-30
  Administered 2013-12-29 – 2013-12-30 (×2): 90 mg via ORAL
  Filled 2013-12-29 (×3): qty 1

## 2013-12-29 MED ORDER — TICAGRELOR 90 MG PO TABS
ORAL_TABLET | ORAL | Status: AC
  Filled 2013-12-29: qty 1

## 2013-12-29 MED ORDER — LIDOCAINE HCL (PF) 1 % IJ SOLN
INTRAMUSCULAR | Status: AC
  Filled 2013-12-29: qty 30

## 2013-12-29 NOTE — Care Management Note (Signed)
    Page 1 of 1   12/30/2013     10:10:40 AM   CARE MANAGEMENT NOTE 12/30/2013  Patient:  Alan White, Alan White   Account Number:  [id number]  Date Initiated:  12/29/2013  Documentation initiated by:  GRAVES-BIGELOW,BRENDA  Subjective/Objective Assessment:   Pt admitted for cp. Plan for cath today.     Action/Plan:   CM will continue to monitor for disposition needs.//Benefits check for BRILINTA   Anticipated DC Date:  12/30/2013   Anticipated DC Plan:  HOME/SELF CARE      DC Planning Services  CM consult      Choice offered to / List presented to:             Status of service:  In process, will continue to follow Medicare Important Message given?   (If response is "NO", the following Medicare IM given date fields will be blank) Date Medicare IM given:   Date Additional Medicare IM given:    Discharge Disposition:    Per UR Regulation:  Reviewed for med. necessity/level of care/duration of stay  If discussed at Long Length of Stay Meetings, dates discussed:    Comments:  12/30/13 0845 Oletta Cohn, RN, BSN, NCM [phone] Spoke with pt at bedside regarding benefits check for Brilinta.  Pt has brochure with 30 day free card and refill assistance card intact.  Pt utilizes [facility] on [address] for prescription needs.  NCM called pharmacy to confirm availability of medication.  Information relayed to pt.  Pt verbalizes importance of filling medication upon discharge.

## 2013-12-29 NOTE — Progress Notes (Signed)
  DAILY PROGRESS NOTE  Subjective:  Chest pain overnight .. Some belching with minor relief. Chest still feels sore - worse lying down. ESR is 1.  Pending echocardiogram. Leukocytosis is somewhat worse, but no fever, essentially normal differential on CBC.  Cardiac enzymes have been negative.  Objective:  Temp:  [97.7 F (36.5 C)-98.3 F (36.8 C)] 97.9 F (36.6 C) (03/31 0614) Pulse Rate:  [65-86] 84 (03/31 0742) Resp:  [15-29] 18 (03/31 0614) BP: (104-154)/(68-98) 110/68 mmHg (03/31 0742) SpO2:  [94 %-100 %] 98 % (03/31 0614) Weight:  [209 lb 3.2 oz (94.892 kg)-212 lb (96.163 kg)] 209 lb 6.6 oz (94.989 kg) (03/31 0614) Weight change:   Intake/Output from previous day:    Intake/Output from this shift:    Medications: Current Facility-Administered Medications  Medication Dose Route Frequency Provider Last Rate Last Dose  . 0.9 %  sodium chloride infusion  250 mL Intravenous PRN Rhonda G Barrett, PA-C      . acetaminophen (TYLENOL) tablet 650 mg  650 mg Oral Q4H PRN Rhonda G Barrett, PA-C      . ALPRAZolam (XANAX) tablet 0.25 mg  0.25 mg Oral BID PRN Rhonda G Barrett, PA-C      . aspirin EC tablet 81 mg  81 mg Oral Daily Rhonda G Barrett, PA-C      . atorvastatin (LIPITOR) tablet 40 mg  40 mg Oral q1800 Rhonda G Barrett, PA-C      . carvedilol (COREG) tablet 3.125 mg  3.125 mg Oral BID WC Rhonda G Barrett, PA-C   3.125 mg at 12/29/13 0742  . heparin ADULT infusion 100 units/mL (25000 units/250 mL)  1,400 Units/hr Intravenous Continuous Yancey Pedley C. Rett Stehlik, MD 14 mL/hr at 12/29/13 0748 1,400 Units/hr at 12/29/13 0748  . HYDROcodone-acetaminophen (NORCO/VICODIN) 5-325 MG per tablet 1-2 tablet  1-2 tablet Oral Q4H PRN Rhonda G Barrett, PA-C   1 tablet at 12/28/13 1832  . nicotine (NICODERM CQ - dosed in mg/24 hours) patch 14 mg  14 mg Transdermal Daily Rhonda G Barrett, PA-C   14 mg at 12/28/13 1843  . nitroGLYCERIN (NITROSTAT) SL tablet 0.4 mg  0.4 mg Sublingual Q5 Min x 3 PRN  Rhonda G Barrett, PA-C      . ondansetron (ZOFRAN) injection 4 mg  4 mg Intravenous Q6H PRN Rhonda G Barrett, PA-C      . pantoprazole (PROTONIX) EC tablet 80 mg  80 mg Oral BID Rhonda G Barrett, PA-C   80 mg at 12/28/13 1832  . sodium chloride 0.9 % injection 3 mL  3 mL Intravenous Q12H Rhonda G Barrett, PA-C      . sodium chloride 0.9 % injection 3 mL  3 mL Intravenous PRN Rhonda G Barrett, PA-C      . zolpidem (AMBIEN) tablet 5 mg  5 mg Oral QHS PRN,MR X 1 Rhonda G Barrett, PA-C        Physical Exam: General appearance: alert and no distress Neck: no carotid bruit and no JVD Lungs: clear to auscultation bilaterally Heart: regular rate and rhythm Abdomen: soft, non-tender; bowel sounds normal; no masses,  no organomegaly Extremities: extremities normal, atraumatic, no cyanosis or edema and POP over left anterior chest Pulses: 2+ and symmetric Skin: Skin color, texture, turgor normal. No rashes or lesions Neurologic: Grossly normal Psych: Does not appear anxious  Lab Results: Results for orders placed during the hospital encounter of 12/28/13 (from the past 48 hour(s))  CBC     Status: Abnormal   Collection   Time    12/28/13  1:02 PM      Result Value Ref Range   WBC 11.9 (*) 4.0 - 10.5 K/uL   RBC 5.05  4.22 - 5.81 MIL/uL   Hemoglobin 15.9  13.0 - 17.0 g/dL   HCT 45.5  39.0 - 52.0 %   MCV 90.1  78.0 - 100.0 fL   MCH 31.5  26.0 - 34.0 pg   MCHC 34.9  30.0 - 36.0 g/dL   RDW 13.7  11.5 - 15.5 %   Platelets 224  150 - 400 K/uL  BASIC METABOLIC PANEL     Status: Abnormal   Collection Time    12/28/13  1:02 PM      Result Value Ref Range   Sodium 142  137 - 147 mEq/L   Potassium 3.7  3.7 - 5.3 mEq/L   Chloride 105  96 - 112 mEq/L   CO2 23  19 - 32 mEq/L   Glucose, Bld 104 (*) 70 - 99 mg/dL   BUN 11  6 - 23 mg/dL   Creatinine, Ser 0.97  0.50 - 1.35 mg/dL   Calcium 9.2  8.4 - 10.5 mg/dL   GFR calc non Af Amer >90  >90 mL/min   GFR calc Af Amer >90  >90 mL/min   Comment:  (NOTE)     The eGFR has been calculated using the CKD EPI equation.     This calculation has not been validated in all clinical situations.     eGFR's persistently <90 mL/min signify possible Chronic Kidney     Disease.  PRO B NATRIURETIC PEPTIDE     Status: Abnormal   Collection Time    12/28/13  1:02 PM      Result Value Ref Range   Pro B Natriuretic peptide (BNP) 961.1 (*) 0 - 125 pg/mL  DIFFERENTIAL     Status: Abnormal   Collection Time    12/28/13  1:02 PM      Result Value Ref Range   Neutrophils Relative % 53  43 - 77 %   Neutro Abs 6.8  1.7 - 7.7 K/uL   Lymphocytes Relative 38  12 - 46 %   Lymphs Abs 5.0 (*) 0.7 - 4.0 K/uL   Monocytes Relative 7  3 - 12 %   Monocytes Absolute 0.9  0.1 - 1.0 K/uL   Eosinophils Relative 2  0 - 5 %   Eosinophils Absolute 0.3  0.0 - 0.7 K/uL   Basophils Relative 0  0 - 1 %   Basophils Absolute 0.0  0.0 - 0.1 K/uL  I-STAT TROPOININ, ED     Status: Abnormal   Collection Time    12/28/13  1:26 PM      Result Value Ref Range   Troponin i, poc 0.11 (*) 0.00 - 0.08 ng/mL   Comment NOTIFIED PHYSICIAN     Comment 3            Comment: Due to the release kinetics of cTnI,     a negative result within the first hours     of the onset of symptoms does not rule out     myocardial infarction with certainty.     If myocardial infarction is still suspected,     repeat the test at appropriate intervals.  TROPONIN I     Status: None   Collection Time    12/28/13  4:17 PM      Result Value Ref Range     Troponin I <0.30  <0.30 ng/mL   Comment:            Due to the release kinetics of cTnI,     a negative result within the first hours     of the onset of symptoms does not rule out     myocardial infarction with certainty.     If myocardial infarction is still suspected,     repeat the test at appropriate intervals.  APTT     Status: None   Collection Time    12/28/13  4:43 PM      Result Value Ref Range   aPTT 31  24 - 37 seconds  PROTIME-INR      Status: None   Collection Time    12/28/13  4:43 PM      Result Value Ref Range   Prothrombin Time 13.2  11.6 - 15.2 seconds   INR 1.02  0.00 - 1.49  CK TOTAL AND CKMB     Status: None   Collection Time    12/28/13  6:22 PM      Result Value Ref Range   Total CK 43  7 - 232 U/L   CK, MB 1.7  0.3 - 4.0 ng/mL   Relative Index RELATIVE INDEX IS INVALID  0.0 - 2.5   Comment: WHEN CK < 100 U/L             SEDIMENTATION RATE     Status: None   Collection Time    12/28/13  6:40 PM      Result Value Ref Range   Sed Rate 1  0 - 16 mm/hr  HEPARIN LEVEL (UNFRACTIONATED)     Status: Abnormal   Collection Time    12/28/13 10:10 PM      Result Value Ref Range   Heparin Unfractionated 0.28 (*) 0.30 - 0.70 IU/mL   Comment:            IF HEPARIN RESULTS ARE BELOW     EXPECTED VALUES, AND PATIENT     DOSAGE HAS BEEN CONFIRMED,     SUGGEST FOLLOW UP TESTING     OF ANTITHROMBIN III LEVELS.  TROPONIN I     Status: None   Collection Time    12/28/13 10:10 PM      Result Value Ref Range   Troponin I <0.30  <0.30 ng/mL   Comment:            Due to the release kinetics of cTnI,     a negative result within the first hours     of the onset of symptoms does not rule out     myocardial infarction with certainty.     If myocardial infarction is still suspected,     repeat the test at appropriate intervals.  TSH     Status: None   Collection Time    12/28/13 10:10 PM      Result Value Ref Range   TSH 1.653  0.350 - 4.500 uIU/mL   Comment: Performed at [place] [facility]     Status: Abnormal   Collection Time    12/28/13 10:10 PM      Result Value Ref Range   Hemoglobin A1C 6.0 (*) <5.7 %   Comment: (NOTE)  According to the ADA Clinical Practice Recommendations for 2011, when     HbA1c is used as a screening test:      >=6.5%   Diagnostic of Diabetes Mellitus               (if abnormal result  is confirmed)     5.7-6.4%   Increased risk of developing Diabetes Mellitus     References:Diagnosis and Classification of Diabetes Mellitus,Diabetes     VEHM,0947,09(GGEZM 1):S62-S69 and Standards of Medical Care in             Diabetes - 2011,Diabetes OQHU,7654,65 (Suppl 1):S11-S61.   Mean Plasma Glucose 126 (*) <117 mg/dL   Comment: Performed at [place] (UNFRACTIONATED)     Status: None   Collection Time    12/29/13  2:34 AM      Result Value Ref Range   Heparin Unfractionated 0.33  0.30 - 0.70 IU/mL   Comment:            IF HEPARIN RESULTS ARE BELOW     EXPECTED VALUES, AND PATIENT     DOSAGE HAS BEEN CONFIRMED,     SUGGEST FOLLOW UP TESTING     OF ANTITHROMBIN III LEVELS.  CBC     Status: Abnormal   Collection Time    12/29/13  2:34 AM      Result Value Ref Range   WBC 12.9 (*) 4.0 - 10.5 K/uL   RBC 4.49  4.22 - 5.81 MIL/uL   Hemoglobin 14.2  13.0 - 17.0 g/dL   HCT 40.6  39.0 - 52.0 %   MCV 90.4  78.0 - 100.0 fL   MCH 31.6  26.0 - 34.0 pg   MCHC 35.0  30.0 - 36.0 g/dL   RDW 13.5  11.5 - 15.5 %   Platelets 218  150 - 400 K/uL  TROPONIN I     Status: None   Collection Time    12/29/13  2:34 AM      Result Value Ref Range   Troponin I <0.30  <0.30 ng/mL   Comment:            Due to the release kinetics of cTnI,     a negative result within the first hours     of the onset of symptoms does not rule out     myocardial infarction with certainty.     If myocardial infarction is still suspected,     repeat the test at appropriate intervals.  COMPREHENSIVE METABOLIC PANEL     Status: Abnormal   Collection Time    12/29/13  2:34 AM      Result Value Ref Range   Sodium 141  137 - 147 mEq/L   Potassium 3.8  3.7 - 5.3 mEq/L   Chloride 105  96 - 112 mEq/L   CO2 24  19 - 32 mEq/L   Glucose, Bld 98  70 - 99 mg/dL   BUN 15  6 - 23 mg/dL   Creatinine, Ser 1.23  0.50 - 1.35 mg/dL   Calcium 8.8  8.4 - 10.5 mg/dL   Total Protein 6.0  6.0 - 8.3 g/dL     Albumin 3.0 (*) 3.5 - 5.2 g/dL   AST 12  0 - 37 U/L   ALT 11  0 - 53 U/L   Alkaline Phosphatase 100  39 - 117 U/L   Total Bilirubin 0.4  0.3 - 1.2 mg/dL   GFR calc  non Af Amer 65 (*) >90 mL/min   GFR calc Af Amer 75 (*) >90 mL/min   Comment: (NOTE)     The eGFR has been calculated using the CKD EPI equation.     This calculation has not been validated in all clinical situations.     eGFR's persistently <90 mL/min signify possible Chronic Kidney     Disease.  LIPID PANEL     Status: Abnormal   Collection Time    12/29/13  2:34 AM      Result Value Ref Range   Cholesterol 143  0 - 200 mg/dL   Triglycerides 136  <150 mg/dL   HDL 21 (*) >39 mg/dL   Total CHOL/HDL Ratio 6.8     VLDL 27  0 - 40 mg/dL   LDL Cholesterol 95  0 - 99 mg/dL   Comment:            Total Cholesterol/HDL:CHD Risk     Coronary Heart Disease Risk Table                         Men   Women      1/2 Average Risk   3.4   3.3      Average Risk       5.0   4.4      2 X Average Risk   9.6   7.1      3 X Average Risk  23.4   11.0                Use the calculated Patient Ratio     above and the CHD Risk Table     to determine the patient's CHD Risk.                ATP III CLASSIFICATION (LDL):      <100     mg/dL   Optimal      100-129  mg/dL   Near or Above                        Optimal      130-159  mg/dL   Borderline      160-189  mg/dL   High      >190     mg/dL   Very High    Imaging: No results found.  Assessment:  1. Principal Problem: 2.   Chest pain 3. Active Problems: 4.   Intermediate coronary syndrome 5.   Tobacco use 6.   Plan:  1. Persistent chest pain with typical and atypical symptoms. Continued chest wall ache, but somewhat improved. Will obtain echo this morning to evaluate for pericarditis changes, however, EKG, normal cardiac silhouette and low ESR do not support this. EKG still concerning for ischemia, despite positional change in his symptoms. I cannot explain his left breast  tenderness - no mass is appreciated. I would recommend LHC to exclude CAD based on his risk factors.  If negative, a chest CT may be helpful.  Low likelihood of PE based on pioped criteria.  Time Spent Directly with Patient:  15 minutes  Length of Stay:  LOS: 1 day   Alan Casino, MD, [facility] Attending Cardiologist [facility]  Alesia Oshields C 12/29/2013, 8:23 AM

## 2013-12-29 NOTE — H&P (View-Only) (Signed)
DAILY PROGRESS NOTE  Subjective:  Chest pain overnight .Marland Kitchen Some belching with minor relief. Chest still feels sore - worse lying down. ESR is 1.  Pending echocardiogram. Leukocytosis is somewhat worse, but no fever, essentially normal differential on CBC.  Cardiac enzymes have been negative.  Objective:  Temp:  [97.7 F (36.5 C)-98.3 F (36.8 C)] 97.9 F (36.6 C) (03/31 0614) Pulse Rate:  [65-86] 84 (03/31 0742) Resp:  [15-29] 18 (03/31 0614) BP: (104-154)/(68-98) 110/68 mmHg (03/31 0742) SpO2:  [94 %-100 %] 98 % (03/31 0614) Weight:  [209 lb 3.2 oz (94.892 kg)-212 lb (96.163 kg)] 209 lb 6.6 oz (94.989 kg) (03/31 0614) Weight change:   Intake/Output from previous day:    Intake/Output from this shift:    Medications: Current Facility-Administered Medications  Medication Dose Route Frequency Provider Last Rate Last Dose  . 0.9 %  sodium chloride infusion  250 mL Intravenous PRN Rhonda G Barrett, PA-C      . acetaminophen (TYLENOL) tablet 650 mg  650 mg Oral Q4H PRN Rhonda G Barrett, PA-C      . ALPRAZolam Duanne Moron) tablet 0.25 mg  0.25 mg Oral BID PRN Evelene Croon Barrett, PA-C      . aspirin EC tablet 81 mg  81 mg Oral Daily Rhonda G Barrett, PA-C      . atorvastatin (LIPITOR) tablet 40 mg  40 mg Oral q1800 Rhonda G Barrett, PA-C      . carvedilol (COREG) tablet 3.125 mg  3.125 mg Oral BID WC Rhonda G Barrett, PA-C   3.125 mg at 12/29/13 0742  . heparin ADULT infusion 100 units/mL (25000 units/250 mL)  1,400 Units/hr Intravenous Continuous Pixie Casino, MD 14 mL/hr at 12/29/13 0748 1,400 Units/hr at 12/29/13 0748  . HYDROcodone-acetaminophen (NORCO/VICODIN) 5-325 MG per tablet 1-2 tablet  1-2 tablet Oral Q4H PRN Lonn Georgia, PA-C   1 tablet at 12/28/13 1832  . nicotine (NICODERM CQ - dosed in mg/24 hours) patch 14 mg  14 mg Transdermal Daily Rhonda G Barrett, PA-C   14 mg at 12/28/13 1843  . nitroGLYCERIN (NITROSTAT) SL tablet 0.4 mg  0.4 mg Sublingual Q5 Min x 3 PRN  Rhonda G Barrett, PA-C      . ondansetron (ZOFRAN) injection 4 mg  4 mg Intravenous Q6H PRN Rhonda G Barrett, PA-C      . pantoprazole (PROTONIX) EC tablet 80 mg  80 mg Oral BID Evelene Croon Barrett, PA-C   80 mg at 12/28/13 1832  . sodium chloride 0.9 % injection 3 mL  3 mL Intravenous Q12H Rhonda G Barrett, PA-C      . sodium chloride 0.9 % injection 3 mL  3 mL Intravenous PRN Rhonda G Barrett, PA-C      . zolpidem (AMBIEN) tablet 5 mg  5 mg Oral QHS PRN,MR X 1 Rhonda G Barrett, PA-C        Physical Exam: General appearance: alert and no distress Neck: no carotid bruit and no JVD Lungs: clear to auscultation bilaterally Heart: regular rate and rhythm Abdomen: soft, non-tender; bowel sounds normal; no masses,  no organomegaly Extremities: extremities normal, atraumatic, no cyanosis or edema and POP over left anterior chest Pulses: 2+ and symmetric Skin: Skin color, texture, turgor normal. No rashes or lesions Neurologic: Grossly normal Psych: Does not appear anxious  Lab Results: Results for orders placed during the hospital encounter of 12/28/13 (from the past 48 hour(s))  CBC     Status: Abnormal   Collection  Time    12/28/13  1:02 PM      Result Value Ref Range   WBC 11.9 (*) 4.0 - 10.5 K/uL   RBC 5.05  4.22 - 5.81 MIL/uL   Hemoglobin 15.9  13.0 - 17.0 g/dL   HCT 45.5  39.0 - 52.0 %   MCV 90.1  78.0 - 100.0 fL   MCH 31.5  26.0 - 34.0 pg   MCHC 34.9  30.0 - 36.0 g/dL   RDW 13.7  11.5 - 15.5 %   Platelets 224  150 - 400 K/uL  BASIC METABOLIC PANEL     Status: Abnormal   Collection Time    12/28/13  1:02 PM      Result Value Ref Range   Sodium 142  137 - 147 mEq/L   Potassium 3.7  3.7 - 5.3 mEq/L   Chloride 105  96 - 112 mEq/L   CO2 23  19 - 32 mEq/L   Glucose, Bld 104 (*) 70 - 99 mg/dL   BUN 11  6 - 23 mg/dL   Creatinine, Ser 0.97  0.50 - 1.35 mg/dL   Calcium 9.2  8.4 - 10.5 mg/dL   GFR calc non Af Amer >90  >90 mL/min   GFR calc Af Amer >90  >90 mL/min   Comment:  (NOTE)     The eGFR has been calculated using the CKD EPI equation.     This calculation has not been validated in all clinical situations.     eGFR's persistently <90 mL/min signify possible Chronic Kidney     Disease.  PRO B NATRIURETIC PEPTIDE     Status: Abnormal   Collection Time    12/28/13  1:02 PM      Result Value Ref Range   Pro B Natriuretic peptide (BNP) 961.1 (*) 0 - 125 pg/mL  DIFFERENTIAL     Status: Abnormal   Collection Time    12/28/13  1:02 PM      Result Value Ref Range   Neutrophils Relative % 53  43 - 77 %   Neutro Abs 6.8  1.7 - 7.7 K/uL   Lymphocytes Relative 38  12 - 46 %   Lymphs Abs 5.0 (*) 0.7 - 4.0 K/uL   Monocytes Relative 7  3 - 12 %   Monocytes Absolute 0.9  0.1 - 1.0 K/uL   Eosinophils Relative 2  0 - 5 %   Eosinophils Absolute 0.3  0.0 - 0.7 K/uL   Basophils Relative 0  0 - 1 %   Basophils Absolute 0.0  0.0 - 0.1 K/uL  I-STAT TROPOININ, ED     Status: Abnormal   Collection Time    12/28/13  1:26 PM      Result Value Ref Range   Troponin i, poc 0.11 (*) 0.00 - 0.08 ng/mL   Comment NOTIFIED PHYSICIAN     Comment 3            Comment: Due to the release kinetics of cTnI,     a negative result within the first hours     of the onset of symptoms does not rule out     myocardial infarction with certainty.     If myocardial infarction is still suspected,     repeat the test at appropriate intervals.  TROPONIN I     Status: None   Collection Time    12/28/13  4:17 PM      Result Value Ref Range  Troponin I <0.30  <0.30 ng/mL   Comment:            Due to the release kinetics of cTnI,     a negative result within the first hours     of the onset of symptoms does not rule out     myocardial infarction with certainty.     If myocardial infarction is still suspected,     repeat the test at appropriate intervals.  APTT     Status: None   Collection Time    12/28/13  4:43 PM      Result Value Ref Range   aPTT 31  24 - 37 seconds  PROTIME-INR      Status: None   Collection Time    12/28/13  4:43 PM      Result Value Ref Range   Prothrombin Time 13.2  11.6 - 15.2 seconds   INR 1.02  0.00 - 1.49  CK TOTAL AND CKMB     Status: None   Collection Time    12/28/13  6:22 PM      Result Value Ref Range   Total CK 43  7 - 232 U/L   CK, MB 1.7  0.3 - 4.0 ng/mL   Relative Index RELATIVE INDEX IS INVALID  0.0 - 2.5   Comment: WHEN CK < 100 U/L             SEDIMENTATION RATE     Status: None   Collection Time    12/28/13  6:40 PM      Result Value Ref Range   Sed Rate 1  0 - 16 mm/hr  HEPARIN LEVEL (UNFRACTIONATED)     Status: Abnormal   Collection Time    12/28/13 10:10 PM      Result Value Ref Range   Heparin Unfractionated 0.28 (*) 0.30 - 0.70 IU/mL   Comment:            IF HEPARIN RESULTS ARE BELOW     EXPECTED VALUES, AND PATIENT     DOSAGE HAS BEEN CONFIRMED,     SUGGEST FOLLOW UP TESTING     OF ANTITHROMBIN III LEVELS.  TROPONIN I     Status: None   Collection Time    12/28/13 10:10 PM      Result Value Ref Range   Troponin I <0.30  <0.30 ng/mL   Comment:            Due to the release kinetics of cTnI,     a negative result within the first hours     of the onset of symptoms does not rule out     myocardial infarction with certainty.     If myocardial infarction is still suspected,     repeat the test at appropriate intervals.  TSH     Status: None   Collection Time    12/28/13 10:10 PM      Result Value Ref Range   TSH 1.653  0.350 - 4.500 uIU/mL   Comment: Performed at [place] [facility]     Status: Abnormal   Collection Time    12/28/13 10:10 PM      Result Value Ref Range   Hemoglobin A1C 6.0 (*) <5.7 %   Comment: (NOTE)  According to the ADA Clinical Practice Recommendations for 2011, when     HbA1c is used as a screening test:      >=6.5%   Diagnostic of Diabetes Mellitus               (if abnormal result  is confirmed)     5.7-6.4%   Increased risk of developing Diabetes Mellitus     References:Diagnosis and Classification of Diabetes Mellitus,Diabetes     VEHM,0947,09(GGEZM 1):S62-S69 and Standards of Medical Care in             Diabetes - 2011,Diabetes OQHU,7654,65 (Suppl 1):S11-S61.   Mean Plasma Glucose 126 (*) <117 mg/dL   Comment: Performed at [place] (UNFRACTIONATED)     Status: None   Collection Time    12/29/13  2:34 AM      Result Value Ref Range   Heparin Unfractionated 0.33  0.30 - 0.70 IU/mL   Comment:            IF HEPARIN RESULTS ARE BELOW     EXPECTED VALUES, AND PATIENT     DOSAGE HAS BEEN CONFIRMED,     SUGGEST FOLLOW UP TESTING     OF ANTITHROMBIN III LEVELS.  CBC     Status: Abnormal   Collection Time    12/29/13  2:34 AM      Result Value Ref Range   WBC 12.9 (*) 4.0 - 10.5 K/uL   RBC 4.49  4.22 - 5.81 MIL/uL   Hemoglobin 14.2  13.0 - 17.0 g/dL   HCT 40.6  39.0 - 52.0 %   MCV 90.4  78.0 - 100.0 fL   MCH 31.6  26.0 - 34.0 pg   MCHC 35.0  30.0 - 36.0 g/dL   RDW 13.5  11.5 - 15.5 %   Platelets 218  150 - 400 K/uL  TROPONIN I     Status: None   Collection Time    12/29/13  2:34 AM      Result Value Ref Range   Troponin I <0.30  <0.30 ng/mL   Comment:            Due to the release kinetics of cTnI,     a negative result within the first hours     of the onset of symptoms does not rule out     myocardial infarction with certainty.     If myocardial infarction is still suspected,     repeat the test at appropriate intervals.  COMPREHENSIVE METABOLIC PANEL     Status: Abnormal   Collection Time    12/29/13  2:34 AM      Result Value Ref Range   Sodium 141  137 - 147 mEq/L   Potassium 3.8  3.7 - 5.3 mEq/L   Chloride 105  96 - 112 mEq/L   CO2 24  19 - 32 mEq/L   Glucose, Bld 98  70 - 99 mg/dL   BUN 15  6 - 23 mg/dL   Creatinine, Ser 1.23  0.50 - 1.35 mg/dL   Calcium 8.8  8.4 - 10.5 mg/dL   Total Protein 6.0  6.0 - 8.3 g/dL     Albumin 3.0 (*) 3.5 - 5.2 g/dL   AST 12  0 - 37 U/L   ALT 11  0 - 53 U/L   Alkaline Phosphatase 100  39 - 117 U/L   Total Bilirubin 0.4  0.3 - 1.2 mg/dL   GFR calc  non Af Amer 65 (*) >90 mL/min   GFR calc Af Amer 75 (*) >90 mL/min   Comment: (NOTE)     The eGFR has been calculated using the CKD EPI equation.     This calculation has not been validated in all clinical situations.     eGFR's persistently <90 mL/min signify possible Chronic Kidney     Disease.  LIPID PANEL     Status: Abnormal   Collection Time    12/29/13  2:34 AM      Result Value Ref Range   Cholesterol 143  0 - 200 mg/dL   Triglycerides 136  <150 mg/dL   HDL 21 (*) >39 mg/dL   Total CHOL/HDL Ratio 6.8     VLDL 27  0 - 40 mg/dL   LDL Cholesterol 95  0 - 99 mg/dL   Comment:            Total Cholesterol/HDL:CHD Risk     Coronary Heart Disease Risk Table                         Men   Women      1/2 Average Risk   3.4   3.3      Average Risk       5.0   4.4      2 X Average Risk   9.6   7.1      3 X Average Risk  23.4   11.0                Use the calculated Patient Ratio     above and the CHD Risk Table     to determine the patient's CHD Risk.                ATP III CLASSIFICATION (LDL):      <100     mg/dL   Optimal      100-129  mg/dL   Near or Above                        Optimal      130-159  mg/dL   Borderline      160-189  mg/dL   High      >190     mg/dL   Very High    Imaging: No results found.  Assessment:  1. Principal Problem: 2.   Chest pain 3. Active Problems: 4.   Intermediate coronary syndrome 5.   Tobacco use 6.   Plan:  1. Persistent chest pain with typical and atypical symptoms. Continued chest wall ache, but somewhat improved. Will obtain echo this morning to evaluate for pericarditis changes, however, EKG, normal cardiac silhouette and low ESR do not support this. EKG still concerning for ischemia, despite positional change in his symptoms. I cannot explain his left breast  tenderness - no mass is appreciated. I would recommend LHC to exclude CAD based on his risk factors.  If negative, a chest CT may be helpful.  Low likelihood of PE based on pioped criteria.  Time Spent Directly with Patient:  15 minutes  Length of Stay:  LOS: 1 day   Pixie Casino, MD, [facility] Attending Cardiologist [facility]  Brunilda Eble C 12/29/2013, 8:23 AM

## 2013-12-29 NOTE — Plan of Care (Signed)
Problem: Phase I Progression Outcomes Goal: MD aware of Cardiac Marker results Outcome: Progressing Enzymes have been negative times two.

## 2013-12-29 NOTE — ED Provider Notes (Signed)
This patient was seen in conjunction with the resident physician, Dr. Marcha Solders.  The documentation accurately reflects the patient's ED course.  On my exam the patient complained of chest pain.,  The patient has had worsening pain for several days, no prior cardiac evaluation.     The patient's EKG on arrival, and he was notably abnormal, with T-wave inversions.   Given the abnormalities, his ongoing chest pain, the absence of prior evaluation, and his initial troponin was positive patient received heparin drip. Subsequently discussed the patient's case with our cardiology team.  Given that the chest pain, abnormal EKG, or drip he required admission for further evaluation and management.  On re-exam the patient's pain improved, but with continued pain, he continued to receive heparin.  CRITICAL CARE Performed by: Gerhard Munch Total critical care time: 35 Critical care time was exclusive of separately billable procedures and treating other patients. Critical care was necessary to treat or prevent imminent or life-threatening deterioration. Critical care was time spent personally by me on the following activities: development of treatment plan with patient and/or surrogate as well as nursing, discussions with consultants, evaluation of patient's response to treatment, examination of patient, obtaining history from patient or surrogate, ordering and performing treatments and interventions, ordering and review of laboratory studies, ordering and review of radiographic studies, pulse oximetry and re-evaluation of patient's condition.   Gerhard Munch, MD 12/29/13 [phone]

## 2013-12-29 NOTE — Progress Notes (Signed)
I reviewed Alan White's echo - he has an LAD territory wall motion abnormality with EF 45-50%. No pericardial effusion. Would recommend left heart catheterization today.  He is agreeable to this.  Chrystie Nose, MD, [facility] Attending Cardiologist [facility]

## 2013-12-29 NOTE — CV Procedure (Signed)
Left Heart Catheterization with Coronary Angiography and PCI Report  Alan White  53 y.o.  male 07/06/1961  Procedure Date: 12/29/2013  Referring Physician: Rennis Golden, MD Primary Cardiologist: [place] Hilty, MD  INDICATIONS: ACS  PROCEDURE: 1. Left heart catheterization; 2. Coronary angiography; 3. Left ventriculography; 4. Drug-eluting stent implantation LAD; 5. Drug-eluting stent circumflex  CONSENT:  The risks, benefits, and details of the procedure were explained in detail to the patient. Risks including death, stroke, heart attack, kidney injury, allergy, limb ischemia, bleeding and radiation injury were discussed.  The patient verbalized understanding and wanted to proceed.  Informed written consent was obtained.  PROCEDURE TECHNIQUE:  After Xylocaine anesthesia a 5 French Slender sheath was placed in the right radial artery with an Angiocath and modified Seldinger technique stick.  Coronary angiography was done using a 5 [place] JR 4 and JL 3.5 cm diagnostic catheter.  Left ventriculography was done using the 5 [place] JR 4 catheter and hand injection.  The left main coronary artery is very short resulting in essentially a double ostial left system. A severe lesion in the proximal to mid LAD and in the mid circumflex were noted to be angiographically significant. After consideration we decided to proceed with PCI given the focal nature of the lesions.  We used an XB LAD 6 French 3.5 cm catheter which selectively engage the circumflex artery. A bivalirudin infusion and bolus was started. ACT was documented greater than 300. A BMW wire was used. We predilated the circumflex with a 3.0 12 mm long balloon. We then positioned and deployed a 3.5 x 12 mm long Promus Premier stent. Postdilatation was was with a 3.75 x 8 mm Hartley Euphora. TIMI grade 3 flow was noted. The treated segment remains stable we move to the LAD.  We removed the 3.5 XB LAD over a guidewire and inserted a 3.0 cm XB LAD  which selectively engage the LAD. The BMW guidewire was used. We predilated with a 3.0 x 12 mm Sprinter. We then positioned and deployed a 3.0 x 16 mm long Promus Premier. This stent was deployed at 12 atmospheres. Post deployment we noted a distal margin dissection. A second Promus Premier stent, 2.75 x 16 mm, was then positioned to overlap with the initial stent and deployed at 12 atmospheres. The postal balloon was then used to dilate at high pressure throughout the entire stent region to 14 atmospheres. Angiographic assessment of the stented region suggested that the stent was well opposed.  TIMI grade 3 flow was noted in both LAD and circumflex post procedure.  The right radial sheath was removed and hemostasis achieved with a wristband.  MEDICATIONS: Versed 2 mg IV; fentanyl 100 mcg IV    CONTRAST:  Total of 275 cc.  COMPLICATIONS:  None   HEMODYNAMICS:  Aortic pressure 125/76 mmHg; LV pressure 125/12 mmHg; LVEDP 23 mmHg  ANGIOGRAPHIC DATA:   The left main coronary artery is very short presenting a situation that is essentially a double deal left coronary system. No obstructions noted.  The left anterior descending artery is large vessel that contains a mid to proximal eccentric focal 95% stenosis..  The left circumflex artery is large and contains an eccentric/focal 85-90% mid vessel stenosis.  The right coronary artery is widely patent, and dominant.   PCI RESULTS: 1. The circumflex PCI was performed using a 12 mm long by 3.5 mm Promus Premier post dilated to 3.75 mm.  2. The LAD was treated with a 3.0 x 16  overlapped distally with a 2.75 x 16 Promus Premier. The LAD procedure was complicated by the development of a distal dissection at the stent margin. Postdilatation pressure was 14 atmospheres postdilatation was 23 mm in diameter. Good stent apposition was felt to be present at the completion of the case. TIMI grade 3 flow was noted.  LEFT VENTRICULOGRAM:  Left ventricular  angiogram was done in the 30 RAO projection and revealed inferoapical moderate to severe hypokinesis. Ejection fraction is 50%   IMPRESSIONS:  1. Severe proximal to mid LAD and mid circumflex stenoses as outlined above.   2. Normal right coronary artery.  3. Wall motion abnormality involving the inferoapical wall. Preserved LVEF at 50-55%  4. Successful circumflex DES implantation post dilated to 3.75 mm in diameter and an aneurysmal portion of the vessel. 0% stenosis was noted post deployment.  5. Successful deployment of overlapping stents in the proximal to mid LAD from 99% to 0% with TIMI grade 3 flow. Postdilatation diameter was 3.0 mm.   RECOMMENDATION:  Dual antiplatelet therapy for greater than 12 months. Brilinta can be switched to Plavix at 6 months.   Eligible for discharge in a.m. if no complications.

## 2013-12-29 NOTE — Progress Notes (Signed)
UR Completed Rubena Roseman Graves-Bigelow, RN,BSN [phone]  

## 2013-12-29 NOTE — Interval H&P Note (Signed)
Cath Lab Visit (complete for each Cath Lab visit)  Clinical Evaluation Leading to the Procedure:   ACS: no  Non-ACS:    Anginal Classification: CCS III  Anti-ischemic medical therapy: Minimal Therapy (1 class of medications)  Non-Invasive Test Results: Intermediate-risk stress test findings: cardiac mortality 1-3%/year  Prior CABG: No previous CABG      History and Physical Interval Note:  12/29/2013 3:00 PM  Alan White  has presented today for surgery, with the diagnosis of CHEST PAIN  The various methods of treatment have been discussed with the patient and family. After consideration of risks, benefits and other options for treatment, the patient has consented to  Procedure(s): LEFT HEART CATHETERIZATION WITH CORONARY ANGIOGRAM (N/A) as a surgical intervention .  The patient's history has been reviewed, patient examined, no change in status, stable for surgery.  I have reviewed the patient's chart and labs.  Questions were answered to the patient's satisfaction.     Alan White

## 2013-12-29 NOTE — Plan of Care (Signed)
Problem: Phase I Progression Outcomes Goal: Aspirin unless contraindicated Outcome: Completed/Met Date Met:  12/29/13 Pt given 324 mg of ASA in ED.

## 2013-12-29 NOTE — Progress Notes (Signed)
ANTICOAGULATION CONSULT NOTE - Follow Up Consult  Pharmacy Consult for heparin Indication: chest pain/ACS  Labs:  Recent Labs  12/28/13 1302 12/28/13 1617 12/28/13 1643 12/28/13 1822 12/28/13 2210 12/29/13 0234  HGB 15.9  --   --   --   --  14.2  HCT 45.5  --   --   --   --  40.6  PLT 224  --   --   --   --  218  APTT  --   --  31  --   --   --   LABPROT  --   --  13.2  --   --   --   INR  --   --  1.02  --   --   --   HEPARINUNFRC  --   --   --   --  0.28* 0.33  CREATININE 0.97  --   --   --   --   --   CKTOTAL  --   --   --  43  --   --   CKMB  --   --   --  1.7  --   --   TROPONINI  --  <0.30  --   --  <0.30  --     Assessment/Plan:  53yo male now therapeutic on heparin after rate increase; note that lab was drawn only 3.5hr after rate change. Will continue gtt at current rate and confirm stable with additional level.   Vernard Gambles, PharmD, BCPS  12/29/2013,3:12 AM

## 2013-12-29 NOTE — Progress Notes (Signed)
  Echocardiogram 2D Echocardiogram has been performed.  Mylynn Dinh FRANCES 12/29/2013, 10:53 AM

## 2013-12-29 NOTE — Progress Notes (Signed)
ANTICOAGULATION CONSULT NOTE - Follow Up Consult  Pharmacy Consult for Heparin  Indication: chest pain/ACS  No Known Allergies  Patient Measurements: Height: 6' (182.9 cm) Weight: 209 lb 6.6 oz (94.989 kg) IBW/kg (Calculated) : 77.6 Heparin Dosing Weight: 95kg  Vital Signs: Temp: 97.9 F (36.6 C) (03/31 0614) Temp src: Oral (03/31 0614) BP: 110/68 mmHg (03/31 0742) Pulse Rate: 84 (03/31 0742)  Labs:  Recent Labs  12/28/13 1302  12/28/13 1617 12/28/13 1643 12/28/13 1822 12/28/13 2210 12/29/13 0234 12/29/13 0935  HGB 15.9  --   --   --   --   --  14.2  --   HCT 45.5  --   --   --   --   --  40.6  --   PLT 224  --   --   --   --   --  218  --   APTT  --   --   --  31  --   --   --   --   LABPROT  --   --   --  13.2  --   --   --   --   INR  --   --   --  1.02  --   --   --   --   HEPARINUNFRC  --   --   --   --   --  0.28* 0.33 0.42  CREATININE 0.97  --   --   --   --   --  1.23  --   CKTOTAL  --   --   --   --  43  --   --   --   CKMB  --   --   --   --  1.7  --   --   --   TROPONINI  --   < > <0.30  --   --  <0.30 <0.30 <0.30  < > = values in this interval not displayed.  Estimated Creatinine Clearance: 82.2 ml/min (by C-G formula based on Cr of 1.23).   Medications:  Heparin @ 1400 units/hr  Assessment: 52yom continues on heparin for chest pain. Troponins negative thus far. Confirmatory heparin level is therapeutic. Patient will have ECHO today and if unrevealing then she will go for cath.   CBC stable. No bleeding reported.  Goal of Therapy:  Heparin level 0.3-0.7 units/ml Monitor platelets by anticoagulation protocol: Yes   Plan:  1) Continue heparin at 1400 units/hr 2) Follow up plans for possible cath  Fredrik Rigger 12/29/2013,10:33 AM

## 2013-12-30 ENCOUNTER — Encounter (HOSPITAL_COMMUNITY): Payer: Self-pay | Admitting: Cardiology

## 2013-12-30 DIAGNOSIS — I519 Heart disease, unspecified: Secondary | ICD-10-CM | POA: Diagnosis present

## 2013-12-30 DIAGNOSIS — I2510 Atherosclerotic heart disease of native coronary artery without angina pectoris: Secondary | ICD-10-CM

## 2013-12-30 DIAGNOSIS — R9431 Abnormal electrocardiogram [ECG] [EKG]: Secondary | ICD-10-CM | POA: Diagnosis present

## 2013-12-30 DIAGNOSIS — E8881 Metabolic syndrome: Secondary | ICD-10-CM | POA: Diagnosis present

## 2013-12-30 DIAGNOSIS — Z9861 Coronary angioplasty status: Secondary | ICD-10-CM | POA: Diagnosis not present

## 2013-12-30 DIAGNOSIS — E785 Hyperlipidemia, unspecified: Secondary | ICD-10-CM

## 2013-12-30 DIAGNOSIS — F17200 Nicotine dependence, unspecified, uncomplicated: Secondary | ICD-10-CM

## 2013-12-30 HISTORY — DX: Metabolic syndrome: E88.81

## 2013-12-30 HISTORY — DX: Metabolic syndrome: E88.810

## 2013-12-30 HISTORY — DX: Atherosclerotic heart disease of native coronary artery without angina pectoris: I25.10

## 2013-12-30 HISTORY — DX: Hyperlipidemia, unspecified: E78.5

## 2013-12-30 LAB — BASIC METABOLIC PANEL
BUN: 13 mg/dL (ref 6–23)
CALCIUM: 9 mg/dL (ref 8.4–10.5)
CO2: 21 meq/L (ref 19–32)
CREATININE: 1.07 mg/dL (ref 0.50–1.35)
Chloride: 105 mEq/L (ref 96–112)
GFR calc Af Amer: 89 mL/min — ABNORMAL LOW (ref >90)
GFR calc non Af Amer: 77 mL/min — ABNORMAL LOW (ref >90)
Glucose, Bld: 86 mg/dL (ref 70–99)
Potassium: 3.8 mEq/L (ref 3.7–5.3)
Sodium: 140 mEq/L (ref 137–147)

## 2013-12-30 LAB — CBC
HCT: 40.8 % (ref 39.0–52.0)
Hemoglobin: 13.9 g/dL (ref 13.0–17.0)
MCH: 30.5 pg (ref 26.0–34.0)
MCHC: 34.1 g/dL (ref 30.0–36.0)
MCV: 89.7 fL (ref 78.0–100.0)
PLATELETS: 207 10*3/uL (ref 150–400)
RBC: 4.55 MIL/uL (ref 4.22–5.81)
RDW: 13.4 % (ref 11.5–15.5)
WBC: 10.3 10*3/uL (ref 4.0–10.5)

## 2013-12-30 MED ORDER — LIVING WELL WITH DIABETES BOOK
Freq: Once | Status: AC
  Administered 2013-12-30: 11:00:00
  Filled 2013-12-30: qty 1

## 2013-12-30 MED ORDER — NITROGLYCERIN 0.4 MG SL SUBL: 0.4000 mg | SUBLINGUAL_TABLET | SUBLINGUAL | Status: DC | PRN

## 2013-12-30 MED ORDER — ASPIRIN 81 MG PO CHEW: 81.0000 mg | CHEWABLE_TABLET | Freq: Every day | ORAL | Status: DC

## 2013-12-30 MED ORDER — NICOTINE 14 MG/24HR TD PT24: 14.0000 mg | MEDICATED_PATCH | Freq: Every day | TRANSDERMAL | Status: DC

## 2013-12-30 MED ORDER — TICAGRELOR 90 MG PO TABS: 90.0000 mg | ORAL_TABLET | Freq: Two times a day (BID) | ORAL | Status: DC

## 2013-12-30 MED ORDER — ATORVASTATIN CALCIUM 40 MG PO TABS: 40.0000 mg | ORAL_TABLET | Freq: Every day | ORAL | Status: DC

## 2013-12-30 MED ORDER — CARVEDILOL 3.125 MG PO TABS: 3.1250 mg | ORAL_TABLET | Freq: Two times a day (BID) | ORAL | Status: DC

## 2013-12-30 MED FILL — Sodium Chloride IV Soln 0.9%: INTRAVENOUS | Qty: 50 | Status: AC

## 2013-12-30 NOTE — Plan of Care (Signed)
Problem: Food- and Nutrition-Related Knowledge Deficit (NB-1.1) Goal: Nutrition education Formal process to instruct or train a patient/client in a skill or to impart knowledge to help patients/clients voluntarily manage or modify food choices and eating behavior to maintain or improve health. Outcome: Completed/Met Date Met:  12/30/13  RD consulted for nutrition education regarding diabetes.   Per NP pt is a borderline diabetic. Pt admits to drinking about 7 Mt Dews per day and honey buns for Breakfast. Pt usually skips lunch and eats dinner and goes straight to bed. Pt seemed willing to make changes but admits that he will likely not go cold [place] with soda or cigarettes.     Lab Results  Component Value Date    HGBA1C 6.0* 12/28/2013    RD provided "Carbohydrate Counting for People with Diabetes" handout from the Academy of Nutrition and Dietetics. Discussed different food groups and their effects on blood sugar, emphasizing carbohydrate-containing foods. Provided list of carbohydrates and recommended serving sizes of common foods.  Discussed importance of controlled and consistent carbohydrate intake throughout the day. Provided examples of ways to balance meals/snacks and encouraged intake of high-fiber, whole grain complex carbohydrates. Teach back method used.  Expect fair compliance.  Body mass index is 28.4 kg/(m^2). Pt meets criteria for overweight based on current BMI.  Pt being discharged after education.   [place], [place], [place] Pager [phone] After Hours Pager

## 2013-12-30 NOTE — Discharge Summary (Signed)
Physician Discharge Summary       Patient ID: Alan White MRN: [id number] DOB/AGE: Jun 13, 1961 53 y.o.  Admit date: 12/28/2013 Discharge date: 12/30/2013  Discharge Diagnoses:  Principal Problem:   Unstable angina, negative MI Active Problems:   CAD (coronary artery disease), 12/29/13 PCI/DES LCX and PCI/DES LAD with overlapping DES    Abnormal EKG   LV dysfunction, EF by cath 50-55%, by Echo 45-50%   Intermediate coronary syndrome   Tobacco use   Dyslipidemia, goal LDL below 70   Metabolic syndrome, HgbA1C 6.0    Discharged Condition: good  Primary cardiologist:  New Dr. Rennis GoldenHilty PCP: none  Procedures: 12/29/13 cardiac cath by Dr. Katrinka BlazingSmith 12/29/13 PCI stent to LCX and 2 DES to LAD with Promus Premier stents by Dr. Alicia AmelSmith  Hospital Course: Alan White is a 53 y.o. male with no history of CAD. CRFs are tobacco, FH. No known history of HTN, HL, DM.  He presented 12/28/13 with a 4 day history of intermittent chest pressure. It was left substernal, radiating to both arms. It was 8/10 at its worst. It had occurred at rest and with exertion. He tried OTC PPI and Tums, without relief. One time, he took ASA which helped. It seems to happen more often in the afternoon/evening. It has also woken him from sleep. He had SOB with it it if occurred with exertion, but also describes DOE. He has had no N&V or diaphoresis. He told his wife about it 3 days ago, has had multiple episodes. He took a day off from work and came to the ER because it was scaring him.  He was admitted by Dr. Rennis GoldenHilty with addition of IV heparin and NTG.  Cardiac enzymes were negative- exceptfor 0.11 of troponin marker.  Pt underwent cardiac cath with findings of: Severe proximal to mid LAD and mid circumflex stenoses as outlined above.  2. Normal right coronary artery.  3. Wall motion abnormality involving the inferoapical wall. Preserved LVEF at 50-55%  4. Successful circumflex DES implantation post dilated to 3.75 mm in diameter  and an aneurysmal portion of the vessel. 0% stenosis was noted post deployment.  5. Successful deployment of overlapping stents in the proximal to mid LAD from 99% to 0% with TIMI grade 3 flow. Postdilatation diameter was 3.0  Dual antiplatelet therapy for greater than 12 months. Brilinta can be switched to Plavix at 6 months.   Pt did well post procedure.  EKG with incomplete RBBB, t wave inversions in ant lat leads.  Was seen by Dr. [place] and found stable for discharge. Will ambulate with cardiac rehab first.  Have called [facility] to arrange new pt appointment, they are to call pt.   Other issues borderline diabetes will have dietician see before discharge.  Pt on statin, BB and asa, Brilinta.    BP is borderline, ACE will be added as outpatient.  No work until 01/04/14.    Consults: None  Significant Diagnostic Studies:  BMET    Component Value Date/Time   NA 140 12/30/2013 0250   K 3.8 12/30/2013 0250   CL 105 12/30/2013 0250   CO2 21 12/30/2013 0250   GLUCOSE 86 12/30/2013 0250   BUN 13 12/30/2013 0250   CREATININE 1.07 12/30/2013 0250   CALCIUM 9.0 12/30/2013 0250   GFRNONAA 77* 12/30/2013 0250   GFRAA 89* 12/30/2013 0250    CBC    Component Value Date/Time   WBC 10.3 12/30/2013 0250   RBC 4.55 12/30/2013 0250   HGB  13.9 12/30/2013 0250   HCT 40.8 12/30/2013 0250   PLT 207 12/30/2013 0250   MCV 89.7 12/30/2013 0250   MCH 30.5 12/30/2013 0250   MCHC 34.1 12/30/2013 0250   RDW 13.4 12/30/2013 0250   LYMPHSABS 5.0* 12/28/2013 1302   MONOABS 0.9 12/28/2013 1302   EOSABS 0.3 12/28/2013 1302   BASOSABS 0.0 12/28/2013 1302    Troponin 0.11 as POC otherwise all negative <0.30 CRP 0.7 HgbA1C  6.0 TSH 1.653 Total CHol 143, TG 136, HDL 21, LDL 95  2D Echo:  Left ventricle: The cavity size was normal. There was mild concentric hypertrophy. Systolic function was mildly reduced. The estimated ejection fraction was in the range of 45% to 50%. Mid to distal anteroapical and inferoapical hypokinesis,  with mid-distal inferior "hinge" point. Prominent, thickened LV apical false tendon. Wall motion was normal; there were no regional wall motion abnormalities. Doppler parameters are consistent with abnormal left ventricular relaxation (grade 1 diastolic dysfunction). The E/e' ratio is <10, suggesting normal LV filling pressure. - Aortic valve: Trileaflet. Sclerosis without stenosis. No regurgitation. - Mitral valve: Mildly thickened leaflets . Trivial regurgitation. - Left atrium: The atrium was normal in size. - Inferior vena cava: The vessel was normal in size; the respirophasic diameter changes were in the normal range (= 50%); findings are consistent with normal central venous pressure. - Pericardium, extracardiac: There was no pericardial effusion.    Discharge Exam: Blood pressure 110/72, pulse 72, temperature 97.7 F (36.5 C), temperature source Oral, resp. rate 18, height 6' (1.829 m), weight 209 lb 6.6 oz (94.989 kg), SpO2 97.00%.  Disposition: Home     Discharge Orders   Future Appointments Provider Department Dept Phone   01/08/2014 9:30 AM Chrystie Nose, MD [facility] [phone]   Future Orders Complete By Expires   Amb Referral to Cardiac Rehabilitation  As directed        Medication List    STOP taking these medications       aspirin EC 325 MG tablet  Replaced by:  aspirin 81 MG chewable tablet     ibuprofen 200 MG tablet  Commonly known as:  ADVIL,MOTRIN      TAKE these medications       aspirin 81 MG chewable tablet  Chew 1 tablet (81 mg total) by mouth daily.     atorvastatin 40 MG tablet  Commonly known as:  LIPITOR  Take 1 tablet (40 mg total) by mouth daily at 6 PM.     carvedilol 3.125 MG tablet  Commonly known as:  COREG  Take 1 tablet (3.125 mg total) by mouth 2 (two) times daily with a meal.     esomeprazole 20 MG capsule  Commonly known as:  NEXIUM  Take 20 mg by mouth daily at 12 noon.     nicotine 14 mg/24hr  patch  Commonly known as:  NICODERM CQ - dosed in mg/24 hours  Place 1 patch (14 mg total) onto the skin daily.     nitroGLYCERIN 0.4 MG SL tablet  Commonly known as:  NITROSTAT  Place 1 tablet (0.4 mg total) under the tongue every 5 (five) minutes x 3 doses as needed for chest pain.     Ticagrelor 90 MG Tabs tablet  Commonly known as:  BRILINTA  Take 1 tablet (90 mg total) by mouth 2 (two) times daily.       Follow-up Information   Follow up with Chrystie Nose, MD On 01/08/2014. (at 09:30 AM)  Specialty:  Cardiology   Contact information:   [address] [place] [address] [phone]        Discharge Instructions: [facility] should call you for an appointment for Primary Care Physician.  Your glucose is running high, you need to decrease/stop sweets, use brown rice instead of white, same for bread.  Important to control/prevent diabetes.  Dietician should see you today before discharge.  Call [facility] at [phone] if any bleeding, swelling or drainage at cath site.  May shower, no tub baths for 48 hours for groin sticks.   Take 1 NTG, under your tongue, while sitting.  If no relief of pain may repeat NTG, one tab every 5 minutes up to 3 tablets total over 15 minutes.  If no relief CALL 911.  If you have dizziness/lightheadness  while taking NTG, stop taking and call 911.        Important to stop smoking.  Use Nicoderm patches.  But do not smoke with patches in place, could cause a heart attack.    Do not stop Brilinta, it is to keep your stents open.  Stopping could cause a heart attack.  Signed: Leone Brand Nurse Practitioner-Certified [facility]: [facility] 12/30/2013, 9:14 AM  Time spent on discharge :40 minutes.   Patient seen and examined and history reviewed. Agree with above findings and plan. See prior rounding note.  Theron Arista [facility] 12/30/2013 10:48 AM

## 2013-12-30 NOTE — Discharge Instructions (Signed)
[facility] should call you for an appointment for Primary Care Physician.  Your glucose is running high, you need to decrease/stop sweets, use brown rice instead of white, same for bread.  Important to control/prevent diabetes.  Dietician should see you today before discharge.  Call [facility] at [phone] if any bleeding, swelling or drainage at cath site.  May shower, no tub baths for 48 hours for groin sticks.   Take 1 NTG, under your tongue, while sitting.  If no relief of pain may repeat NTG, one tab every 5 minutes up to 3 tablets total over 15 minutes.  If no relief CALL 911.  If you have dizziness/lightheadness  while taking NTG, stop taking and call 911.        Important to stop smoking.  Use Nicoderm patches.  But do not smoke with patches in place, could cause a heart attack.    Do not stop Brilinta, it is to keep your stents open.  Stopping could cause a heart attack.

## 2013-12-30 NOTE — Progress Notes (Signed)
TELEMETRY: Reviewed telemetry pt in NSR: Filed Vitals:   12/29/13 2320 12/30/13 0318 12/30/13 0615 12/30/13 0814  BP: 114/76  120/75 110/72  Pulse: 72  66 72  Temp: 98 F (36.7 C)  97.5 F (36.4 C) 97.7 F (36.5 C)  TempSrc: Oral  Oral Oral  Resp: 17  20 18   Height:      Weight:  209 lb 6.6 oz (94.989 kg)    SpO2: 94%  97% 97%    Intake/Output Summary (Last 24 hours) at 12/30/13 1049 Last data filed at 12/29/13 2300  Gross per 24 hour  Intake 783.33 ml  Output      0 ml  Net 783.33 ml    SUBJECTIVE No complaints. Felt immediate relief of chest pain with stent procedure.   LABS: Basic Metabolic Panel:  Recent Labs  16/07/9602/31/15 0234 12/30/13 0250  NA 141 140  K 3.8 3.8  CL 105 105  CO2 24 21  GLUCOSE 98 86  BUN 15 13  CREATININE 1.23 1.07  CALCIUM 8.8 9.0   Liver Function Tests:  Recent Labs  12/29/13 0234  AST 12  ALT 11  ALKPHOS 100  BILITOT 0.4  PROT 6.0  ALBUMIN 3.0*   No results found for this basename: LIPASE, AMYLASE,  in the last 72 hours CBC:  Recent Labs  12/28/13 1302 12/29/13 0234 12/30/13 0250  WBC 11.9* 12.9* 10.3  NEUTROABS 6.8  --   --   HGB 15.9 14.2 13.9  HCT 45.5 40.6 40.8  MCV 90.1 90.4 89.7  PLT 224 218 207   Cardiac Enzymes:  Recent Labs  12/28/13 1617 12/28/13 1822 12/28/13 2210 12/29/13 0234 12/29/13 0935  CKTOTAL  --  43  --   --   --   CKMB  --  1.7  --   --   --   TROPONINI <0.30  --  <0.30 <0.30 <0.30   BNP: No components found with this basename: POCBNP,  D-Dimer: No results found for this basename: DDIMER,  in the last 72 hours Hemoglobin A1C:  Recent Labs  12/28/13 2210  HGBA1C 6.0*   Fasting Lipid Panel:  Recent Labs  12/29/13 0234  CHOL 143  HDL 21*  LDLCALC 95  TRIG [id number]  CHOLHDL 6.8   Thyroid Function Tests:  Recent Labs  12/28/13 2210  TSH 1.653   Anemia Panel: No results found for this basename: VITAMINB12, FOLATE, FERRITIN, TIBC, IRON, RETICCTPCT,  in the last 72  hours  Radiology/Studies:  No results found.  Ecg: NSR with anterior T wave abnormality.  PHYSICAL EXAM General: Well developed, well nourished, in no acute distress. Head: Normocephalic, atraumatic, sclera non-icteric, no xanthomas, nares are without discharge. Neck: Negative for carotid bruits. JVD not elevated. Lungs: Clear bilaterally to auscultation without wheezes, rales, or rhonchi. Breathing is unlabored. Heart: RRR S1 S2 without murmurs, rubs, or gallops.  Abdomen: Soft, non-tender, non-distended with normoactive bowel sounds. No hepatomegaly. No rebound/guarding. No obvious abdominal masses. Msk:  Strength and tone appears normal for age. Extremities: No clubbing, cyanosis or edema.  Distal pedal pulses are 2+ and equal bilaterally. Right wrist without hematoma. Neuro: Alert and oriented X 3. Moves all extremities spontaneously. Psych:  Responds to questions appropriately with a normal affect.  ASSESSMENT AND PLAN: 1. Unstable angina. S/p DES of the LCX and LAD. Continue dual antiplatelet therapy for one year. 2. Tobacco abuse. Counseled on importance of smoking cessation. 3. Dyslipidemia. On high dose statin.  Plan DC home today.  Follow up with Dr. Rennis Golden in 2 weeks.  Principal Problem:   Unstable angina, negative MI Active Problems:   Intermediate coronary syndrome   Tobacco use   CAD (coronary artery disease), 12/29/13 PCI/DES LCX and PCI/DES LAD with overlapping DES    Abnormal EKG   LV dysfunction, EF by cath 50-55%, by Echo 45-50%   Dyslipidemia, goal LDL below 70   Metabolic syndrome, HgbA1C 6.0     Signed, Peter Swaziland MD,FACC 12/30/2013 10:52 AM

## 2013-12-30 NOTE — Progress Notes (Signed)
CARDIAC REHAB PHASE I   PRE:  Rate/Rhythm: 64SR  BP:  Supine: 110/72  Sitting:   Standing:    SaO2:   MODE:  Ambulation: 700 ft   POST:  Rate/Rhythm: 86SR  BP:  Supine:   Sitting: 123/82  Standing:    SaO2:  0810-0920 Pt walked 700 ft with steady gait. Tolerated well. No CP. Stated he felt so much better since PCI. Education completed with pt and wife who voiced understanding. Discussed smoking cessation with pt who is not sure how he will quit. He has a nicotine patch on now. Pt stated he had a lot on his mind and he was not sure how he would quit. Handouts given. Discussed CRP 2 and pt gave permission to refer to [place]. Stated he would consider program. Wife would like pt to attend. Pt drinks 6 or 7 regular mountain dews daily. Discussed with pt trying to cut back. He stated giving up his soda would be harder than the smoking.    Luetta Nutting, RN BSN  12/30/2013 9:14 AM

## 2013-12-30 NOTE — Progress Notes (Signed)
TR BAND REMOVAL  LOCATION:    right radial  DEFLATED PER PROTOCOL:    yes  TIME BAND OFF / DRESSING APPLIED:    21:30   SITE UPON ARRIVAL:    Level 0  SITE AFTER BAND REMOVAL:    Level 0  REVERSE ALLEN'S TEST:     positive  CIRCULATION SENSATION AND MOVEMENT:    Within Normal Limits   yes  COMMENTS:   Pt tolerated removal of TR band without complications, will continue to monitor patient.

## 2014-01-05 ENCOUNTER — Telehealth: Payer: Self-pay | Admitting: *Deleted

## 2014-01-05 NOTE — Telephone Encounter (Signed)
Faxed to [facility] orders for phase 2 cardiac rehab 

## 2014-01-08 ENCOUNTER — Encounter: Payer: Self-pay | Admitting: Internal Medicine

## 2014-01-08 ENCOUNTER — Ambulatory Visit (INDEPENDENT_AMBULATORY_CARE_PROVIDER_SITE_OTHER): Payer: BC Managed Care – PPO | Admitting: Internal Medicine

## 2014-01-08 VITALS — BP 118/70 | HR 65 | Ht 72.0 in | Wt 210.3 lb

## 2014-01-08 DIAGNOSIS — E785 Hyperlipidemia, unspecified: Secondary | ICD-10-CM

## 2014-01-08 DIAGNOSIS — I2510 Atherosclerotic heart disease of native coronary artery without angina pectoris: Secondary | ICD-10-CM

## 2014-01-08 DIAGNOSIS — R9431 Abnormal electrocardiogram [ECG] [EKG]: Secondary | ICD-10-CM

## 2014-01-08 DIAGNOSIS — I519 Heart disease, unspecified: Secondary | ICD-10-CM

## 2014-01-08 DIAGNOSIS — Z720 Tobacco use: Secondary | ICD-10-CM

## 2014-01-08 DIAGNOSIS — F17200 Nicotine dependence, unspecified, uncomplicated: Secondary | ICD-10-CM

## 2014-01-08 MED ORDER — NICOTINE 7 MG/24HR TD PT24: 7.0000 mg | MEDICATED_PATCH | Freq: Every day | TRANSDERMAL | Status: DC

## 2014-01-08 MED ORDER — BUPROPION HCL ER (SMOKING DET) 150 MG PO TB12: ORAL_TABLET | ORAL | Status: DC

## 2014-01-08 NOTE — Progress Notes (Signed)
OFFICE NOTE  Chief Complaint:  Hospital follow-up  Primary Care Physician: Pcp Not In System  HPI:  Alan White is a 53 y.o. male with no history of CAD. CRFs are tobacco, FH. No known history of HTN, HL, DM. He presented 12/28/13 with a 4 day history of intermittent chest pressure. It was left substernal, radiating to both arms. It was 8/10 at its worst. It had occurred at rest and with exertion. He tried OTC PPI and Tums, without relief. One time, he took ASA which helped. It seems to happen more often in the afternoon/evening. It has also woken him from sleep. He had SOB with it it if occurred with exertion, but also describes DOE. He has had no N&V or diaphoresis. He told his wife about it 3 days ago, has had multiple episodes. He took a day off from work and came to the ER because it was scaring him. He was admitted by Dr. Rennis Golden with addition of IV heparin and NTG. Cardiac enzymes were negative- exceptfor 0.11 of troponin marker. Pt underwent cardiac cath with findings of:   1. Severe proximal to mid LAD and mid circumflex stenoses as outlined above.  2. Normal right coronary artery.  3. Wall motion abnormality involving the inferoapical wall. Preserved LVEF at 50-55%  4. Successful circumflex DES implantation post dilated to 3.75 mm in diameter and an aneurysmal portion of the vessel. 0% stenosis was noted post deployment.  5. Successful deployment of overlapping stents in the proximal to mid LAD from 99% to 0% with TIMI grade 3 flow. Postdilatation diameter was 3.0   Dual antiplatelet therapy for greater than 12 months. Brilinta can be switched to Plavix at 6 months.   Pt did well post procedure. EKG with incomplete RBBB, t wave inversions in ant lat leads. Was seen by Dr. Swaziland and found stable for discharge. Will ambulate with cardiac rehab first. Have called [facility] to arrange new pt appointment, they are to call pt.   Other issues borderline diabetes will have  dietician see before discharge. Pt on statin, BB and asa, Brilinta. BP is borderline, ACE will be added as outpatient. No work until  01/04/14.  Upon followup today Alan White is feeling great. He continues to take his medications without any difficulty. He is working on improving his diet and exercise. He is interested in cardiac rehabilitation. Finally he is trying to quit smoking, but is having great difficulty with this. He is using a 14 mg nicotine patch. He really wants to quit.   PMHx:  Past Medical History  Diagnosis Date  . GERD (gastroesophageal reflux disease)   . CAD (coronary artery disease), 12/29/13 PCI/DES LCX and PCI/DES LAD with overlapping DES  12/30/2013  . Abnormal EKG 12/30/2013  . LV dysfunction, EF by cath 50-55% 12/30/2013  . Dyslipidemia, goal LDL below 70 12/30/2013  . Metabolic syndrome, HgbA1C 6.0  12/30/2013    Past Surgical History  Procedure Laterality Date  . Coronary angioplasty with stent placement  12/29/13    DES to LCX and overlapping stents DES mid LAD    FAMHx:  Family History  Problem Relation Age of Onset  . Heart attack Brother     Deceased  . Heart attack Brother   . Stroke Sister   . Diabetes Father     also heart disease  . Diabetes Mother   . Cancer Father     Deceased  . Cancer Mother     Deceased  SOCHx:   reports that he has been smoking Cigarettes.  He has a 30 pack-year smoking history. He has never used smokeless tobacco. He reports that he drinks alcohol. He reports that he does not use illicit drugs.  ALLERGIES:  No Known Allergies  ROS: A comprehensive review of systems was negative.  HOME MEDS: Current Outpatient Prescriptions  Medication Sig Dispense Refill  . aspirin 81 MG chewable tablet Chew 1 tablet (81 mg total) by mouth daily.      Marland Kitchen. atorvastatin (LIPITOR) 40 MG tablet Take 1 tablet (40 mg total) by mouth daily at 6 PM.  30 tablet  6  . carvedilol (COREG) 3.125 MG tablet Take 1 tablet (3.125 mg total) by mouth 2  (two) times daily with a meal.  60 tablet  6  . esomeprazole (NEXIUM) 20 MG capsule Take 20 mg by mouth daily at 12 noon.      . nitroGLYCERIN (NITROSTAT) 0.4 MG SL tablet Place 1 tablet (0.4 mg total) under the tongue every 5 (five) minutes x 3 doses as needed for chest pain.  25 tablet  4  . Ticagrelor (BRILINTA) 90 MG TABS tablet Take 1 tablet (90 mg total) by mouth 2 (two) times daily.  60 tablet  11  . buPROPion (ZYBAN) 150 MG 12 hr tablet Take 1 tablet daily for 3 days then increase to 2 tablets daily (every 12 hours). Set a quit date, at least 2 weeks after starting medicine.  34 tablet  2  . nicotine (EQL NICOTINE) 7 mg/24hr patch Place 1 patch (7 mg total) onto the skin daily.  28 patch  0   No current facility-administered medications for this visit.    LABS/IMAGING: No results found for this or any previous visit (from the past 48 hour(s)). No results found.  VITALS: BP 118/70  Pulse 65  Ht 6' (1.829 m)  Wt 210 lb 4.8 oz (95.391 kg)  BMI 28.52 kg/m2  EXAM: General appearance: alert and no distress Neck: no carotid bruit and no JVD Lungs: clear to auscultation bilaterally Heart: regular rate and rhythm, S1, S2 normal, no murmur, click, rub or gallop Abdomen: soft, non-tender; bowel sounds normal; no masses,  no organomegaly Extremities: extremities normal, atraumatic, no cyanosis or edema Pulses: 2+ and symmetric Skin: Skin color, texture, turgor normal. No rashes or lesions Neurologic: Grossly normal Psych: Mood, affect normal  EKG: Normal sinus rhythm at 65 with marked ST and T-wave inversions anterolaterally  ASSESSMENT: 1. Coronary artery disease status post two-vessel PCI to the left circumflex and LAD 2. Dyslipidemia on statin 3. Tobacco abuse 4. Mildly reduced LV function with EF 50-55%  PLAN: 1.   Alan White is feeling significantly better after coronary stenting. In fact he says it is feeling better every day. He continues to work on dietary changes and  is doing some more walking. I feel it is okay to go ahead and start cardiac rehabilitation. He is currently reduced some of his smoking and is using a nicotine patch. He does not feel that he can quit on his own. I think is a good candidate for Welbutrin.  I would recommend starting that in addition to his nicotine patches. I will prescribe them for the 7 mg patches to help him wean off. He was advised to set a quit date and keep with that.  Plan to see him back in 6 months. He will need to stay on dual antiplatelet therapy, uninterrupted, for one year.  Chrystie NoseKenneth C. Keegen Heffern,  MD, [facility] Attending Cardiologist [facility]  Chrystie Nose 01/08/2014, 4:24 PM

## 2014-01-08 NOTE — Patient Instructions (Addendum)
Your physician has recommended you make the following change in your medication.Alan White  START Zyban 150mg  once daily for 3 days, then increase to twice daily.  Dr. Rennis Golden has lowered your dose of nicotine patch.   You have been referred to cardiac rehab at [facility].   Your physician wants you to follow-up in: 6 months. You will receive a reminder letter in the mail two months in advance. If you don't receive a letter, please call our office to schedule the follow-up appointment.

## 2014-01-12 ENCOUNTER — Other Ambulatory Visit: Payer: Self-pay | Admitting: *Deleted

## 2014-01-12 MED ORDER — BUPROPION HCL ER (XL) 150 MG PO TB24: ORAL_TABLET | ORAL | Status: DC

## 2014-01-12 NOTE — Telephone Encounter (Signed)
Rx was sent to pharmacy electronically. Zyban d/c'ed and generic wellbutrin (bupropion) ordered - per pharmacist same medication just generic

## 2014-01-26 ENCOUNTER — Other Ambulatory Visit: Payer: Self-pay | Admitting: Cardiology

## 2014-01-27 NOTE — Telephone Encounter (Signed)
Rx was sent to pharmacy electronically. 

## 2014-02-04 ENCOUNTER — Ambulatory Visit (HOSPITAL_COMMUNITY): Payer: BC Managed Care – PPO

## 2014-02-08 ENCOUNTER — Ambulatory Visit (HOSPITAL_COMMUNITY): Payer: BC Managed Care – PPO

## 2014-02-10 ENCOUNTER — Ambulatory Visit (HOSPITAL_COMMUNITY): Payer: BC Managed Care – PPO

## 2014-02-12 ENCOUNTER — Ambulatory Visit (HOSPITAL_COMMUNITY): Payer: BC Managed Care – PPO

## 2014-02-15 ENCOUNTER — Ambulatory Visit (HOSPITAL_COMMUNITY): Payer: BC Managed Care – PPO

## 2014-02-17 ENCOUNTER — Ambulatory Visit (HOSPITAL_COMMUNITY): Payer: BC Managed Care – PPO

## 2014-02-19 ENCOUNTER — Ambulatory Visit (HOSPITAL_COMMUNITY): Payer: BC Managed Care – PPO

## 2014-02-22 ENCOUNTER — Ambulatory Visit (HOSPITAL_COMMUNITY): Payer: BC Managed Care – PPO

## 2014-02-24 ENCOUNTER — Ambulatory Visit (HOSPITAL_COMMUNITY): Payer: BC Managed Care – PPO

## 2014-02-26 ENCOUNTER — Ambulatory Visit (HOSPITAL_COMMUNITY): Payer: BC Managed Care – PPO

## 2014-03-01 ENCOUNTER — Ambulatory Visit (HOSPITAL_COMMUNITY): Payer: BC Managed Care – PPO

## 2014-03-03 ENCOUNTER — Ambulatory Visit (HOSPITAL_COMMUNITY): Payer: BC Managed Care – PPO

## 2014-03-04 ENCOUNTER — Telehealth (HOSPITAL_COMMUNITY): Payer: Self-pay | Admitting: *Deleted

## 2014-03-04 NOTE — Telephone Encounter (Signed)
Left message regarding participating in cardiac rehab.  Pt is a no show for 01/2014.  Calling to reschedule or remove from calling list if he no longer interested in participating.  Contact information given.

## 2014-03-05 ENCOUNTER — Ambulatory Visit (HOSPITAL_COMMUNITY): Payer: BC Managed Care – PPO

## 2014-03-08 ENCOUNTER — Ambulatory Visit (HOSPITAL_COMMUNITY): Payer: BC Managed Care – PPO

## 2014-03-10 ENCOUNTER — Ambulatory Visit (HOSPITAL_COMMUNITY): Payer: BC Managed Care – PPO

## 2014-03-12 ENCOUNTER — Ambulatory Visit (HOSPITAL_COMMUNITY): Payer: BC Managed Care – PPO

## 2014-03-15 ENCOUNTER — Ambulatory Visit (HOSPITAL_COMMUNITY): Payer: BC Managed Care – PPO

## 2014-03-17 ENCOUNTER — Ambulatory Visit (HOSPITAL_COMMUNITY): Payer: BC Managed Care – PPO

## 2014-03-18 ENCOUNTER — Encounter (HOSPITAL_COMMUNITY)
Admission: RE | Admit: 2014-03-18 | Discharge: 2014-03-18 | Disposition: A | Discharge status: Home / Self Care | Payer: BC Managed Care – PPO | Source: Ambulatory Visit | Attending: Internal Medicine | Admitting: Internal Medicine

## 2014-03-18 DIAGNOSIS — I2510 Atherosclerotic heart disease of native coronary artery without angina pectoris: Secondary | ICD-10-CM | POA: Insufficient documentation

## 2014-03-18 DIAGNOSIS — E785 Hyperlipidemia, unspecified: Secondary | ICD-10-CM | POA: Insufficient documentation

## 2014-03-18 DIAGNOSIS — I519 Heart disease, unspecified: Secondary | ICD-10-CM | POA: Insufficient documentation

## 2014-03-18 DIAGNOSIS — Z5189 Encounter for other specified aftercare: Secondary | ICD-10-CM | POA: Insufficient documentation

## 2014-03-18 NOTE — Progress Notes (Signed)
Cardiac Rehab Medication Review by a Pharmacist  Does the patient  feel that his/her medications are working for him/her?  yes  Has the patient been experiencing any side effects to the medications prescribed?  no  Does the patient measure his/her own blood pressure or blood glucose at home?  no   Does the patient have any problems obtaining medications due to transportation or finances?   no  Understanding of regimen: excellent Understanding of indications: excellent Potential of compliance: excellent  Pharmacist comments:  Alan White is a pleasant 53 yo M presents today for cardiac rehab orientation. Patient has a good understanding of all his medication and has great compliance. However, it seems Alan White is not ready to quit smoking just yet, and has stopped taking both wellbutrin xl and nicotine patch. Patient attributes a 1 pack/day use. He expresses feeling weird while on wellbutrin xl, and continued smoking while using the patch. He understands the importance of quitting with his recent cardiac event. He will attempt to wean himself down when he is ready. Recommended setting a plan for future plans to quit and maybe decreasing use by 2-3 cig every week.    Alan White M 03/18/2014 8:17 AM

## 2014-03-19 ENCOUNTER — Ambulatory Visit (HOSPITAL_COMMUNITY): Payer: BC Managed Care – PPO

## 2014-03-22 ENCOUNTER — Encounter (HOSPITAL_COMMUNITY)
Admission: RE | Admit: 2014-03-22 | Discharge: 2014-03-22 | Disposition: A | Discharge status: Home / Self Care | Payer: BC Managed Care – PPO | Source: Ambulatory Visit | Attending: Internal Medicine | Admitting: Internal Medicine

## 2014-03-22 ENCOUNTER — Ambulatory Visit (HOSPITAL_COMMUNITY): Payer: BC Managed Care – PPO

## 2014-03-22 DIAGNOSIS — I2510 Atherosclerotic heart disease of native coronary artery without angina pectoris: Secondary | ICD-10-CM | POA: Diagnosis present

## 2014-03-22 DIAGNOSIS — E785 Hyperlipidemia, unspecified: Secondary | ICD-10-CM | POA: Diagnosis not present

## 2014-03-22 DIAGNOSIS — I519 Heart disease, unspecified: Secondary | ICD-10-CM | POA: Diagnosis not present

## 2014-03-22 DIAGNOSIS — Z5189 Encounter for other specified aftercare: Secondary | ICD-10-CM | POA: Diagnosis not present

## 2014-03-22 NOTE — Progress Notes (Signed)
Pt in today for his first day of exercise in cardiac rehab phase II program.  Pt tolerated light exercise with no complaints or shortness of breath.  Medications reviewed.  Pt remarked that he no longer uses the nicotine patch because he continues to smoke.  Pt admits he is not ready to quit. Suggested decreasing the amount, pt did not respond.  Will continue to encourage and support pt with tobacco cessation. Monitor showed sr with no noted ectopy.  PHQ2 score 0.  Pt feels "ok " about his recovery and is back to work. Short term goal get better. Consistent attendance and adherences to home exercise will be key.  Long term goal keep getting stronger.  Continue to monitor his progress and met levels. Monitor showed SR with no noted ectopy. Cherre Huger, BSN

## 2014-03-24 ENCOUNTER — Ambulatory Visit (HOSPITAL_COMMUNITY): Payer: BC Managed Care – PPO

## 2014-03-24 ENCOUNTER — Encounter (HOSPITAL_COMMUNITY)
Admission: RE | Admit: 2014-03-24 | Discharge: 2014-03-24 | Disposition: A | Discharge status: Home / Self Care | Payer: BC Managed Care – PPO | Source: Ambulatory Visit | Attending: Internal Medicine | Admitting: Internal Medicine

## 2014-03-24 DIAGNOSIS — Z5189 Encounter for other specified aftercare: Secondary | ICD-10-CM | POA: Diagnosis not present

## 2014-03-26 ENCOUNTER — Encounter (HOSPITAL_COMMUNITY)
Admission: RE | Admit: 2014-03-26 | Discharge: 2014-03-26 | Disposition: A | Discharge status: Home / Self Care | Payer: BC Managed Care – PPO | Source: Ambulatory Visit | Attending: Internal Medicine | Admitting: Internal Medicine

## 2014-03-26 ENCOUNTER — Ambulatory Visit (HOSPITAL_COMMUNITY): Payer: BC Managed Care – PPO

## 2014-03-26 DIAGNOSIS — Z5189 Encounter for other specified aftercare: Secondary | ICD-10-CM | POA: Diagnosis not present

## 2014-03-29 ENCOUNTER — Ambulatory Visit (HOSPITAL_COMMUNITY): Payer: BC Managed Care – PPO

## 2014-03-29 ENCOUNTER — Encounter (HOSPITAL_COMMUNITY)
Admission: RE | Admit: 2014-03-29 | Discharge: 2014-03-29 | Disposition: A | Discharge status: Home / Self Care | Payer: BC Managed Care – PPO | Source: Ambulatory Visit | Attending: Internal Medicine | Admitting: Internal Medicine

## 2014-03-29 DIAGNOSIS — Z5189 Encounter for other specified aftercare: Secondary | ICD-10-CM | POA: Diagnosis not present

## 2014-03-31 ENCOUNTER — Encounter (HOSPITAL_COMMUNITY)
Admission: RE | Admit: 2014-03-31 | Discharge: 2014-03-31 | Disposition: A | Discharge status: Home / Self Care | Payer: BC Managed Care – PPO | Source: Ambulatory Visit | Attending: Internal Medicine | Admitting: Internal Medicine

## 2014-03-31 ENCOUNTER — Ambulatory Visit (HOSPITAL_COMMUNITY): Payer: BC Managed Care – PPO

## 2014-03-31 DIAGNOSIS — I2510 Atherosclerotic heart disease of native coronary artery without angina pectoris: Secondary | ICD-10-CM | POA: Diagnosis present

## 2014-03-31 DIAGNOSIS — I519 Heart disease, unspecified: Secondary | ICD-10-CM | POA: Insufficient documentation

## 2014-03-31 DIAGNOSIS — E785 Hyperlipidemia, unspecified: Secondary | ICD-10-CM | POA: Insufficient documentation

## 2014-03-31 DIAGNOSIS — Z5189 Encounter for other specified aftercare: Secondary | ICD-10-CM | POA: Insufficient documentation

## 2014-04-02 ENCOUNTER — Ambulatory Visit (HOSPITAL_COMMUNITY): Payer: BC Managed Care – PPO

## 2014-04-05 ENCOUNTER — Ambulatory Visit (HOSPITAL_COMMUNITY): Payer: BC Managed Care – PPO

## 2014-04-05 ENCOUNTER — Encounter (HOSPITAL_COMMUNITY): Payer: BC Managed Care – PPO

## 2014-04-07 ENCOUNTER — Ambulatory Visit (HOSPITAL_COMMUNITY): Payer: BC Managed Care – PPO

## 2014-04-07 ENCOUNTER — Encounter (HOSPITAL_COMMUNITY): Payer: BC Managed Care – PPO

## 2014-04-09 ENCOUNTER — Encounter (HOSPITAL_COMMUNITY): Payer: BC Managed Care – PPO

## 2014-04-09 ENCOUNTER — Ambulatory Visit (HOSPITAL_COMMUNITY): Payer: BC Managed Care – PPO

## 2014-04-12 ENCOUNTER — Encounter (HOSPITAL_COMMUNITY)
Admission: RE | Admit: 2014-04-12 | Discharge: 2014-04-12 | Disposition: A | Discharge status: Home / Self Care | Payer: BC Managed Care – PPO | Source: Ambulatory Visit | Attending: Internal Medicine | Admitting: Internal Medicine

## 2014-04-12 ENCOUNTER — Ambulatory Visit (HOSPITAL_COMMUNITY): Payer: BC Managed Care – PPO

## 2014-04-12 DIAGNOSIS — Z5189 Encounter for other specified aftercare: Secondary | ICD-10-CM | POA: Diagnosis not present

## 2014-04-14 ENCOUNTER — Encounter (HOSPITAL_COMMUNITY)
Admission: RE | Admit: 2014-04-14 | Discharge: 2014-04-14 | Disposition: A | Discharge status: Home / Self Care | Payer: BC Managed Care – PPO | Source: Ambulatory Visit | Attending: Internal Medicine | Admitting: Internal Medicine

## 2014-04-14 ENCOUNTER — Ambulatory Visit (HOSPITAL_COMMUNITY): Payer: BC Managed Care – PPO

## 2014-04-14 DIAGNOSIS — Z5189 Encounter for other specified aftercare: Secondary | ICD-10-CM | POA: Diagnosis not present

## 2014-04-16 ENCOUNTER — Encounter (HOSPITAL_COMMUNITY)
Admission: RE | Admit: 2014-04-16 | Discharge: 2014-04-16 | Disposition: A | Discharge status: Home / Self Care | Payer: BC Managed Care – PPO | Source: Ambulatory Visit | Attending: Internal Medicine | Admitting: Internal Medicine

## 2014-04-16 ENCOUNTER — Ambulatory Visit (HOSPITAL_COMMUNITY): Payer: BC Managed Care – PPO

## 2014-04-16 DIAGNOSIS — Z5189 Encounter for other specified aftercare: Secondary | ICD-10-CM | POA: Diagnosis not present

## 2014-04-19 ENCOUNTER — Ambulatory Visit (HOSPITAL_COMMUNITY): Payer: BC Managed Care – PPO

## 2014-04-19 ENCOUNTER — Encounter (HOSPITAL_COMMUNITY)
Admission: RE | Admit: 2014-04-19 | Discharge: 2014-04-19 | Disposition: A | Discharge status: Home / Self Care | Payer: BC Managed Care – PPO | Source: Ambulatory Visit | Attending: Internal Medicine | Admitting: Internal Medicine

## 2014-04-19 DIAGNOSIS — Z5189 Encounter for other specified aftercare: Secondary | ICD-10-CM | POA: Diagnosis not present

## 2014-04-19 NOTE — Progress Notes (Addendum)
I have reviewed home exercise with Chrissie Noa. The pt is currently completing home exercise.  The patient was advised to walk 2-4 days per week outside of CRP II for 30 minutes continuously.  Pt will also complete one additional day of hand weights outside of CRP II.  Progression of exercise prescription was discussed.  Reviewed THR, pulse, RPE, sign and symptoms, NTG use and when to call 911 or MD.  Also discussed smoking cessation with pt.  Pt voiced understanding.  Alexia Freestone, MS, ACSM RCEP 04/19/2014 11:50 AM

## 2014-04-21 ENCOUNTER — Ambulatory Visit (HOSPITAL_COMMUNITY): Payer: BC Managed Care – PPO

## 2014-04-21 ENCOUNTER — Encounter (HOSPITAL_COMMUNITY)
Admission: RE | Admit: 2014-04-21 | Discharge: 2014-04-21 | Disposition: A | Discharge status: Home / Self Care | Payer: BC Managed Care – PPO | Source: Ambulatory Visit | Attending: Internal Medicine | Admitting: Internal Medicine

## 2014-04-21 DIAGNOSIS — Z5189 Encounter for other specified aftercare: Secondary | ICD-10-CM | POA: Diagnosis not present

## 2014-04-23 ENCOUNTER — Ambulatory Visit (HOSPITAL_COMMUNITY): Payer: BC Managed Care – PPO

## 2014-04-23 ENCOUNTER — Encounter (HOSPITAL_COMMUNITY)
Admission: RE | Admit: 2014-04-23 | Discharge: 2014-04-23 | Disposition: A | Discharge status: Home / Self Care | Payer: BC Managed Care – PPO | Source: Ambulatory Visit | Attending: Internal Medicine | Admitting: Internal Medicine

## 2014-04-23 DIAGNOSIS — Z5189 Encounter for other specified aftercare: Secondary | ICD-10-CM | POA: Diagnosis not present

## 2014-04-26 ENCOUNTER — Ambulatory Visit (HOSPITAL_COMMUNITY): Payer: BC Managed Care – PPO

## 2014-04-26 ENCOUNTER — Encounter (HOSPITAL_COMMUNITY)
Admission: RE | Admit: 2014-04-26 | Discharge: 2014-04-26 | Disposition: A | Discharge status: Home / Self Care | Payer: BC Managed Care – PPO | Source: Ambulatory Visit | Attending: Internal Medicine | Admitting: Internal Medicine

## 2014-04-26 DIAGNOSIS — Z5189 Encounter for other specified aftercare: Secondary | ICD-10-CM | POA: Diagnosis not present

## 2014-04-28 ENCOUNTER — Encounter (HOSPITAL_COMMUNITY): Payer: BC Managed Care – PPO

## 2014-04-28 ENCOUNTER — Ambulatory Visit (HOSPITAL_COMMUNITY): Payer: BC Managed Care – PPO

## 2014-04-30 ENCOUNTER — Ambulatory Visit (HOSPITAL_COMMUNITY): Payer: BC Managed Care – PPO

## 2014-04-30 ENCOUNTER — Encounter (HOSPITAL_COMMUNITY): Payer: BC Managed Care – PPO

## 2014-05-03 ENCOUNTER — Ambulatory Visit (HOSPITAL_COMMUNITY): Payer: BC Managed Care – PPO

## 2014-05-03 ENCOUNTER — Encounter (HOSPITAL_COMMUNITY)
Admission: RE | Admit: 2014-05-03 | Discharge: 2014-05-03 | Disposition: A | Discharge status: Home / Self Care | Payer: BC Managed Care – PPO | Source: Ambulatory Visit | Attending: Internal Medicine | Admitting: Internal Medicine

## 2014-05-03 DIAGNOSIS — I2510 Atherosclerotic heart disease of native coronary artery without angina pectoris: Secondary | ICD-10-CM | POA: Insufficient documentation

## 2014-05-03 DIAGNOSIS — I519 Heart disease, unspecified: Secondary | ICD-10-CM | POA: Diagnosis not present

## 2014-05-03 DIAGNOSIS — Z5189 Encounter for other specified aftercare: Secondary | ICD-10-CM | POA: Insufficient documentation

## 2014-05-03 DIAGNOSIS — E785 Hyperlipidemia, unspecified: Secondary | ICD-10-CM | POA: Diagnosis not present

## 2014-05-03 NOTE — Progress Notes (Signed)
Alease Medina 53 y.o. male Nutrition Note Spoke with pt.  Nutrition Plan and Nutrition Survey goals reviewed with pt. Pt is following Step 1 of the Therapeutic Lifestyle Changes diet. Pt reports he has made several changes to his eating habits. Pt is now eating more frequently during the day. Per pt, "I'm eating several mini meals and 1 large meal a day." Pt used to eat a honey bun, peanut butter crackers, and 7 [employer] regular sodas daily. Pt is now eating more fruits and vegetables. Pt has decreased Mountain Dew consumption to 2 per day. Pt is not ready to decrease [facility] consumption further at this time. Pt has increased water consumption to "4 bottles a day, which is more than I've ever drunk." Pt wants to lose wt. Pt has not been actively trying to lose wt at this time. Pt has been focusing on tobacco cessation. Pt has decreased cigarette use by 1/2. Pt is pre-diabetic according to his last A1c. Pt was aware of pre-diabetes diagnosis. Pre-diabetes and diet discussed. Pt expressed understanding of the information reviewed. Pt aware of nutrition education classes offered.  Nutrition Diagnosis   Food-and nutrition-related knowledge deficit related to lack of exposure to information as related to diagnosis of: ? CVD ? Pre-DM (A1c 6.0)    Overweight related to excessive energy intake as evidenced by a BMI of 28.1  Nutrition Intervention   Pt's individual nutrition plan reviewed with pt.   Benefits of adopting Therapeutic Lifestyle Changes discussed when Medficts reviewed.   Pt to attend the Portion Distortion class   Pt given handouts for: ? Nutrition I class ? Nutrition II class    Continue client-centered nutrition education by RD, as part of interdisciplinary care. Goal(s)   Pt to identify and limit food sources of saturated fat, trans fat, and cholesterol   Pt to identify food quantities necessary to achieve: ? wt loss to a goal wt of 184-202 lb (83.7-91.9 kg) at graduation from  cardiac rehab.  Monitor and Evaluate progress toward nutrition goal with team. Nutrition Risk: Change to Moderate Mickle Plumb, M.Ed, RD, LDN, CDE 05/03/2014 7:44 AM

## 2014-05-05 ENCOUNTER — Encounter (HOSPITAL_COMMUNITY)
Admission: RE | Admit: 2014-05-05 | Discharge: 2014-05-05 | Disposition: A | Discharge status: Home / Self Care | Payer: BC Managed Care – PPO | Source: Ambulatory Visit | Attending: Internal Medicine | Admitting: Internal Medicine

## 2014-05-05 ENCOUNTER — Ambulatory Visit (HOSPITAL_COMMUNITY): Payer: BC Managed Care – PPO

## 2014-05-05 DIAGNOSIS — Z5189 Encounter for other specified aftercare: Secondary | ICD-10-CM | POA: Diagnosis not present

## 2014-05-07 ENCOUNTER — Encounter (HOSPITAL_COMMUNITY)
Admission: RE | Admit: 2014-05-07 | Discharge: 2014-05-07 | Disposition: A | Discharge status: Home / Self Care | Payer: BC Managed Care – PPO | Source: Ambulatory Visit | Attending: Internal Medicine | Admitting: Internal Medicine

## 2014-05-07 ENCOUNTER — Ambulatory Visit (HOSPITAL_COMMUNITY): Payer: BC Managed Care – PPO

## 2014-05-07 DIAGNOSIS — Z5189 Encounter for other specified aftercare: Secondary | ICD-10-CM | POA: Diagnosis not present

## 2014-05-10 ENCOUNTER — Encounter (HOSPITAL_COMMUNITY): Payer: BC Managed Care – PPO

## 2014-05-10 ENCOUNTER — Ambulatory Visit (HOSPITAL_COMMUNITY): Payer: BC Managed Care – PPO

## 2014-05-12 ENCOUNTER — Ambulatory Visit (HOSPITAL_COMMUNITY): Payer: BC Managed Care – PPO

## 2014-05-12 ENCOUNTER — Encounter (HOSPITAL_COMMUNITY): Payer: BC Managed Care – PPO

## 2014-05-14 ENCOUNTER — Encounter (HOSPITAL_COMMUNITY): Payer: BC Managed Care – PPO

## 2014-05-14 ENCOUNTER — Ambulatory Visit (HOSPITAL_COMMUNITY): Payer: BC Managed Care – PPO

## 2014-05-17 ENCOUNTER — Encounter (HOSPITAL_COMMUNITY): Payer: BC Managed Care – PPO

## 2014-05-19 ENCOUNTER — Encounter (HOSPITAL_COMMUNITY): Payer: BC Managed Care – PPO

## 2014-05-21 ENCOUNTER — Encounter (HOSPITAL_COMMUNITY): Payer: BC Managed Care – PPO

## 2014-05-24 ENCOUNTER — Encounter (HOSPITAL_COMMUNITY): Payer: BC Managed Care – PPO

## 2014-05-26 ENCOUNTER — Encounter (HOSPITAL_COMMUNITY): Admission: RE | Admit: 2014-05-26 | Payer: BC Managed Care – PPO | Source: Ambulatory Visit

## 2014-05-28 ENCOUNTER — Encounter (HOSPITAL_COMMUNITY): Payer: BC Managed Care – PPO

## 2014-05-31 ENCOUNTER — Encounter (HOSPITAL_COMMUNITY): Payer: BC Managed Care – PPO

## 2014-06-02 ENCOUNTER — Encounter (HOSPITAL_COMMUNITY): Payer: BC Managed Care – PPO

## 2014-06-03 ENCOUNTER — Telehealth (HOSPITAL_COMMUNITY): Payer: Self-pay | Admitting: *Deleted

## 2014-06-03 NOTE — Telephone Encounter (Signed)
Pt absent for several weeks from rehab.  Message left regarding when he would return or if he would like to be discharged from the program.  Contact information left.

## 2014-06-04 ENCOUNTER — Encounter (HOSPITAL_COMMUNITY): Admission: RE | Admit: 2014-06-04 | Payer: BC Managed Care – PPO | Source: Ambulatory Visit

## 2014-06-09 ENCOUNTER — Encounter (HOSPITAL_COMMUNITY): Payer: BC Managed Care – PPO

## 2014-06-11 ENCOUNTER — Encounter (HOSPITAL_COMMUNITY): Admission: RE | Admit: 2014-06-11 | Payer: BC Managed Care – PPO | Source: Ambulatory Visit

## 2014-06-14 ENCOUNTER — Encounter (HOSPITAL_COMMUNITY): Payer: BC Managed Care – PPO

## 2014-06-16 ENCOUNTER — Encounter (HOSPITAL_COMMUNITY): Payer: BC Managed Care – PPO

## 2014-06-18 ENCOUNTER — Encounter (HOSPITAL_COMMUNITY): Payer: BC Managed Care – PPO

## 2014-06-21 ENCOUNTER — Encounter (HOSPITAL_COMMUNITY): Payer: BC Managed Care – PPO

## 2014-06-22 ENCOUNTER — Telehealth: Payer: Self-pay | Admitting: Internal Medicine

## 2014-06-22 NOTE — Telephone Encounter (Signed)
Closed encounter °

## 2014-06-23 ENCOUNTER — Encounter (HOSPITAL_COMMUNITY): Payer: BC Managed Care – PPO

## 2014-06-25 ENCOUNTER — Encounter (HOSPITAL_COMMUNITY): Payer: BC Managed Care – PPO

## 2014-06-28 ENCOUNTER — Encounter (HOSPITAL_COMMUNITY): Payer: BC Managed Care – PPO

## 2014-06-30 ENCOUNTER — Encounter (HOSPITAL_COMMUNITY): Payer: BC Managed Care – PPO

## 2014-07-02 ENCOUNTER — Encounter (HOSPITAL_COMMUNITY): Payer: BC Managed Care – PPO

## 2014-07-03 ENCOUNTER — Encounter (HOSPITAL_COMMUNITY): Payer: Self-pay | Admitting: Emergency Medicine

## 2014-07-03 ENCOUNTER — Emergency Department (HOSPITAL_COMMUNITY): Payer: BC Managed Care – PPO

## 2014-07-03 ENCOUNTER — Inpatient Hospital Stay (HOSPITAL_COMMUNITY)
Admission: EM | Admit: 2014-07-03 | Discharge: 2014-07-06 | DRG: 287 | Disposition: A | Discharge status: Home / Self Care | Payer: BC Managed Care – PPO | Attending: Cardiovascular Disease | Admitting: Cardiovascular Disease

## 2014-07-03 DIAGNOSIS — E785 Hyperlipidemia, unspecified: Secondary | ICD-10-CM | POA: Diagnosis present

## 2014-07-03 DIAGNOSIS — F17210 Nicotine dependence, cigarettes, uncomplicated: Secondary | ICD-10-CM | POA: Diagnosis present

## 2014-07-03 DIAGNOSIS — R079 Chest pain, unspecified: Secondary | ICD-10-CM | POA: Diagnosis present

## 2014-07-03 DIAGNOSIS — G4730 Sleep apnea, unspecified: Secondary | ICD-10-CM | POA: Diagnosis present

## 2014-07-03 DIAGNOSIS — I2510 Atherosclerotic heart disease of native coronary artery without angina pectoris: Secondary | ICD-10-CM | POA: Diagnosis present

## 2014-07-03 DIAGNOSIS — I519 Heart disease, unspecified: Secondary | ICD-10-CM

## 2014-07-03 DIAGNOSIS — I25110 Atherosclerotic heart disease of native coronary artery with unstable angina pectoris: Secondary | ICD-10-CM

## 2014-07-03 DIAGNOSIS — K219 Gastro-esophageal reflux disease without esophagitis: Secondary | ICD-10-CM | POA: Diagnosis present

## 2014-07-03 DIAGNOSIS — Z7982 Long term (current) use of aspirin: Secondary | ICD-10-CM | POA: Diagnosis not present

## 2014-07-03 DIAGNOSIS — E8881 Metabolic syndrome: Secondary | ICD-10-CM | POA: Diagnosis present

## 2014-07-03 DIAGNOSIS — Z955 Presence of coronary angioplasty implant and graft: Secondary | ICD-10-CM

## 2014-07-03 DIAGNOSIS — R0789 Other chest pain: Principal | ICD-10-CM | POA: Diagnosis present

## 2014-07-03 DIAGNOSIS — Z9861 Coronary angioplasty status: Secondary | ICD-10-CM | POA: Diagnosis present

## 2014-07-03 DIAGNOSIS — Z8249 Family history of ischemic heart disease and other diseases of the circulatory system: Secondary | ICD-10-CM | POA: Diagnosis not present

## 2014-07-03 DIAGNOSIS — I255 Ischemic cardiomyopathy: Secondary | ICD-10-CM

## 2014-07-03 DIAGNOSIS — Z79899 Other long term (current) drug therapy: Secondary | ICD-10-CM | POA: Diagnosis not present

## 2014-07-03 DIAGNOSIS — Z720 Tobacco use: Secondary | ICD-10-CM | POA: Diagnosis present

## 2014-07-03 HISTORY — DX: Sleep apnea, unspecified: G47.30

## 2014-07-03 HISTORY — DX: Nicotine dependence, unspecified, uncomplicated: F17.200

## 2014-07-03 LAB — BASIC METABOLIC PANEL
ANION GAP: 13 (ref 5–15)
BUN: 12 mg/dL (ref 6–23)
CHLORIDE: 103 meq/L (ref 96–112)
CO2: 24 mEq/L (ref 19–32)
Calcium: 9.2 mg/dL (ref 8.4–10.5)
Creatinine, Ser: 0.98 mg/dL (ref 0.50–1.35)
GFR calc Af Amer: >90 mL/min (ref >90)
GFR calc non Af Amer: >90 mL/min (ref >90)
GLUCOSE: 124 mg/dL — AB (ref 70–99)
POTASSIUM: 4 meq/L (ref 3.7–5.3)
SODIUM: 140 meq/L (ref 137–147)

## 2014-07-03 LAB — I-STAT TROPONIN, ED: TROPONIN I, POC: 0.03 ng/mL (ref 0.00–0.08)

## 2014-07-03 LAB — CBC
HCT: 42.4 % (ref 39.0–52.0)
HEMOGLOBIN: 14.5 g/dL (ref 13.0–17.0)
MCH: 30.9 pg (ref 26.0–34.0)
MCHC: 34.2 g/dL (ref 30.0–36.0)
MCV: 90.4 fL (ref 78.0–100.0)
Platelets: 237 10*3/uL (ref 150–400)
RBC: 4.69 MIL/uL (ref 4.22–5.81)
RDW: 13.4 % (ref 11.5–15.5)
WBC: 11.8 10*3/uL — ABNORMAL HIGH (ref 4.0–10.5)

## 2014-07-03 LAB — HEPARIN LEVEL (UNFRACTIONATED): Heparin Unfractionated: 0.62 IU/mL (ref 0.30–0.70)

## 2014-07-03 LAB — TROPONIN I

## 2014-07-03 MED ORDER — MORPHINE SULFATE 4 MG/ML IJ SOLN
6.0000 mg | Freq: Once | INTRAMUSCULAR | Status: AC
  Administered 2014-07-03: 6 mg via INTRAVENOUS
  Filled 2014-07-03: qty 2

## 2014-07-03 MED ORDER — ALPRAZOLAM 0.25 MG PO TABS
0.2500 mg | ORAL_TABLET | Freq: Three times a day (TID) | ORAL | Status: DC
  Administered 2014-07-03 – 2014-07-05 (×7): 0.25 mg via ORAL
  Filled 2014-07-03 (×10): qty 1

## 2014-07-03 MED ORDER — NITROGLYCERIN 2 % TD OINT
0.5000 [in_us] | TOPICAL_OINTMENT | Freq: Four times a day (QID) | TRANSDERMAL | Status: DC
  Administered 2014-07-03 – 2014-07-05 (×9): 0.5 [in_us] via TOPICAL
  Filled 2014-07-03: qty 30

## 2014-07-03 MED ORDER — SODIUM CHLORIDE 0.9 % IJ SOLN
3.0000 mL | Freq: Two times a day (BID) | INTRAMUSCULAR | Status: DC
  Administered 2014-07-03 – 2014-07-05 (×3): 3 mL via INTRAVENOUS

## 2014-07-03 MED ORDER — ASPIRIN 81 MG PO CHEW
324.0000 mg | CHEWABLE_TABLET | Freq: Once | ORAL | Status: AC
  Administered 2014-07-03: 324 mg via ORAL
  Filled 2014-07-03: qty 4

## 2014-07-03 MED ORDER — ONDANSETRON HCL 4 MG/2ML IJ SOLN: 4.0000 mg | Freq: Four times a day (QID) | INTRAMUSCULAR | Status: DC | PRN

## 2014-07-03 MED ORDER — ATORVASTATIN CALCIUM 40 MG PO TABS
40.0000 mg | ORAL_TABLET | Freq: Every day | ORAL | Status: DC
  Administered 2014-07-03 – 2014-07-05 (×3): 40 mg via ORAL
  Filled 2014-07-03 (×4): qty 1

## 2014-07-03 MED ORDER — ACETAMINOPHEN 325 MG PO TABS: 650.0000 mg | ORAL_TABLET | ORAL | Status: DC | PRN

## 2014-07-03 MED ORDER — ASPIRIN 81 MG PO CHEW
81.0000 mg | CHEWABLE_TABLET | Freq: Every day | ORAL | Status: DC
  Administered 2014-07-04: 81 mg via ORAL
  Filled 2014-07-03: qty 1

## 2014-07-03 MED ORDER — NICOTINE 21 MG/24HR TD PT24
21.0000 mg | MEDICATED_PATCH | Freq: Every day | TRANSDERMAL | Status: DC
  Administered 2014-07-03 – 2014-07-06 (×4): 21 mg via TRANSDERMAL
  Filled 2014-07-03 (×4): qty 1

## 2014-07-03 MED ORDER — ACETAMINOPHEN 500 MG PO TABS
500.0000 mg | ORAL_TABLET | Freq: Every day | ORAL | Status: DC
  Administered 2014-07-03 – 2014-07-06 (×4): 500 mg via ORAL
  Filled 2014-07-03 (×4): qty 1

## 2014-07-03 MED ORDER — ZOLPIDEM TARTRATE 5 MG PO TABS
5.0000 mg | ORAL_TABLET | Freq: Every evening | ORAL | Status: DC | PRN
  Administered 2014-07-04: 5 mg via ORAL
  Filled 2014-07-03: qty 1

## 2014-07-03 MED ORDER — HEPARIN BOLUS VIA INFUSION
4000.0000 [IU] | Freq: Once | INTRAVENOUS | Status: AC
  Administered 2014-07-03: 4000 [IU] via INTRAVENOUS
  Filled 2014-07-03: qty 4000

## 2014-07-03 MED ORDER — PANTOPRAZOLE SODIUM 40 MG PO TBEC
40.0000 mg | DELAYED_RELEASE_TABLET | Freq: Every day | ORAL | Status: DC
  Administered 2014-07-03 – 2014-07-06 (×4): 40 mg via ORAL
  Filled 2014-07-03 (×4): qty 1

## 2014-07-03 MED ORDER — TICAGRELOR 90 MG PO TABS
90.0000 mg | ORAL_TABLET | Freq: Two times a day (BID) | ORAL | Status: DC
  Administered 2014-07-03 – 2014-07-06 (×7): 90 mg via ORAL
  Filled 2014-07-03 (×8): qty 1

## 2014-07-03 MED ORDER — MORPHINE SULFATE 4 MG/ML IJ SOLN: 4.0000 mg | INTRAMUSCULAR | Status: DC | PRN

## 2014-07-03 MED ORDER — HEPARIN (PORCINE) IN NACL 100-0.45 UNIT/ML-% IJ SOLN
1300.0000 [IU]/h | INTRAMUSCULAR | Status: DC
  Administered 2014-07-03 – 2014-07-04 (×2): 1400 [IU]/h via INTRAVENOUS
  Administered 2014-07-04: 1300 [IU]/h via INTRAVENOUS
  Filled 2014-07-03 (×5): qty 250

## 2014-07-03 MED ORDER — NITROGLYCERIN 0.4 MG SL SUBL: 0.4000 mg | SUBLINGUAL_TABLET | SUBLINGUAL | Status: DC | PRN

## 2014-07-03 MED ORDER — CARVEDILOL 3.125 MG PO TABS
3.1250 mg | ORAL_TABLET | Freq: Two times a day (BID) | ORAL | Status: DC
  Administered 2014-07-03 – 2014-07-04 (×3): 3.125 mg via ORAL
  Filled 2014-07-03 (×8): qty 1

## 2014-07-03 MED ORDER — OMEPRAZOLE MAGNESIUM 20 MG PO TBEC: 20.0000 mg | DELAYED_RELEASE_TABLET | Freq: Every day | ORAL | Status: DC

## 2014-07-03 NOTE — ED Notes (Addendum)
Pt reports pain unchanged but feels more relaxed

## 2014-07-03 NOTE — H&P (Signed)
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 He smokes a pack a day. He presented in March with [place] and had cath and subsequent LAD and CFX stenting. His EF was 50-55% with inferior/apical HK. He continues to smoke a pk/day. He has been complaint with his medications. He has been having Lt chest discomfort for the past 5-6 days but says he has been having L:t chest, Lt neck, Lt arm constant discomfort for 2 days. He says his symptoms are very similar to his pre PCI symptoms. He denies any exertional compnentt. He says he took NTG x 2this am with no relief and decided to come to the ER. In the ER he tells me MSO4 shot helped but he says he is still having chest pain.  He admits he is very anxious, worried his pain is from his heart. He says he has been compliant with all his medications. Troponin negative so far, EKG with no acute changes.    Problem List: Past Medical History  Diagnosis Date  . GERD (gastroesophageal reflux disease)   . CAD (coronary artery disease), 12/29/13 PCI/DES LCX and PCI/DES LAD with overlapping DES  12/30/2013  . Abnormal EKG 12/30/2013  . LV dysfunction, EF by cath 50-55% 12/30/2013  . Dyslipidemia, goal LDL below 70 12/30/2013  . Metabolic syndrome, HgbA1C 6.0  12/30/2013    Past Surgical History  Procedure Laterality Date  . Coronary angioplasty with stent placement  12/29/13    DES to LCX and overlapping stents DES mid LAD     Allergies: No Known Allergies   Home Medications No current facility-administered medications for this encounter.   Current Outpatient Prescriptions  Medication Sig Dispense Refill  . acetaminophen (TYLENOL) 500 MG tablet Take 500 mg by mouth daily.      Marland Kitchen. aspirin 81 MG chewable tablet Chew 1 tablet (81 mg total) by mouth daily.      Marland Kitchen.  atorvastatin (LIPITOR) 40 MG tablet Take 1 tablet (40 mg total) by mouth daily at 6 PM.  30 tablet  6  . carvedilol (COREG) 3.125 MG tablet Take 1 tablet (3.125 mg total) by mouth 2 (two) times daily with a meal.  60 tablet  6  . nitroGLYCERIN (NITROSTAT) 0.4 MG SL tablet Place 1 tablet (0.4 mg total) under the tongue every 5 (five) minutes x 3 doses as needed for chest pain.  25 tablet  4  . omeprazole (PRILOSEC OTC) 20 MG tablet Take 20 mg by mouth daily.      . ticagrelor (BRILINTA) 90 MG TABS tablet Take 90 mg by mouth 2 (two) times daily.         Family History  Problem Relation Age of Onset  . Heart attack Brother     Deceased  . Heart attack Brother   . Stroke Sister   . Diabetes Father     also heart disease  . Diabetes Mother   . Cancer Father     Deceased  . Cancer Mother     Deceased     History   Social History  . Marital Status: Married    Spouse Name:      Number of Children: N/A  . Years of Education: N/A   Occupational History  . Web designerManager [employer]   Social History Main Topics  . Smoking status: Current Every Day Smoker -- 1.00 packs/day for 30 years    Types: Cigarettes  . Smokeless tobacco: Never Used     Comment: down to 7 cigarettes/daily - uses nicotine patch QD (01/08/14)  . Alcohol Use: Yes     Comment: "Once in a blue moon"  . Drug Use: No  . Sexual Activity: Not on file   Other Topics Concern  . Not on file   Social History Narrative   Pt lives with wife.     Review of Systems: General: negative for chills, fever, night sweats or weight changes.  Cardiovascular: negative for dyspnea on exertion, edema, orthopnea, paroxysmal nocturnal dyspnea or shortness of breath He has had short runs of palpitations- 15 secs Dermatological: negative for rash Respiratory: negative for cough or wheezing Urologic: negative for hematuria Abdominal: negative for nausea, vomiting, diarrhea, bright red blood per rectum, melena, or  hematemesis Neurologic: negative for visual changes, syncope, or dizziness All other systems reviewed and are otherwise negative except as noted above.  Physical Exam: Blood pressure 110/70, pulse 58, temperature 97.3 F (36.3 C), temperature source Oral, resp. rate 11, height 6' (1.829 m), weight 215 lb (97.523 kg), SpO2 97.00%.  General appearance: alert, cooperative and no distress Neck: no carotid bruit and no JVD Lungs: clear to auscultation bilaterally Heart: regular rate and rhythm Abdomen: soft, non-tender; bowel sounds normal; no masses,  no organomegaly Extremities: extremities normal, atraumatic, no cyanosis or edema Pulses: 2+ and symmetric Skin: Skin color, texture, turgor normal. No rashes or lesions Neurologic: Grossly normal    Labs:   Results for orders placed during the hospital encounter of 07/03/14 (from the past 24 hour(s))  CBC     Status: Abnormal   Collection Time    07/03/14  8:26 AM      Result Value Ref Range   WBC 11.8 (*) 4.0 - 10.5 K/uL   RBC 4.69  4.22 - 5.81 MIL/uL   Hemoglobin 14.5  13.0 - 17.0 g/dL   HCT 19.7  58.8 - 32.5 %   MCV 90.4  78.0 - 100.0 fL   MCH 30.9  26.0 - 34.0 pg   MCHC 34.2  30.0 - 36.0 g/dL   RDW 49.8  26.4 - 15.8 %   Platelets 237  150 - 400 K/uL  BASIC METABOLIC PANEL     Status: Abnormal   Collection Time    07/03/14  8:26 AM      Result Value Ref Range   Sodium 140  137 - 147 mEq/L   Potassium 4.0  3.7 - 5.3 mEq/L   Chloride 103  96 - 112 mEq/L   CO2 24  19 - 32 mEq/L   Glucose, Bld 124 (*) 70 - 99 mg/dL   BUN 12  6 - 23 mg/dL   Creatinine, Ser 3.09  0.50 - 1.35 mg/dL   Calcium 9.2  8.4 - 40.7 mg/dL   GFR calc non Af Amer >90  >90 mL/min   GFR calc Af Amer >90  >90 mL/min   Anion gap 13  5 - 15  TROPONIN I     Status: None   Collection Time    07/03/14  8:38 AM      Result Value Ref Range   Troponin I <0.30  <0.30 ng/mL  I-STAT TROPOININ, ED     Status: None   Collection Time    07/03/14  8:39 AM  Result Value Ref Range   Troponin i, poc 0.03  0.00 - 0.08 ng/mL   Comment 3              Radiology/Studies: No results found.  EKG:NSR, no acute changes  ASSESSMENT AND PLAN:  Principal Problem:   Chest pain with moderate risk of acute coronary syndrome Active Problems:   CAD (coronary artery disease), 12/29/13 PCI/DES LCX and PCI/DES LAD with overlapping DES    LV dysfunction, EF by cath 50-55%, by Echo 45-50%   Tobacco use   Dyslipidemia, goal LDL below 70   Metabolic syndrome, HgbA1C 6.0    PLAN: Admit, Heparin, topical nitrates, r/o MI. Add alprazolam.  Consider cath vs Myoview depending on symptoms and Troponin. Will keep him NPO after midnight in case we want to proceed with a Myoview in am.    Signed, Abelino Derrick, PA-C 07/03/2014, 10:09 AM  I have seen and examined the patient along with Abelino Derrick, PA-C.  I have reviewed the chart, notes and new data.  I agree with PA's note.  Key new complaints: mild residual discomfort at rest, prominent increase in dyspnea with exertion Key examination changes: no clinical sign of HF Key new findings / data: low risk enzymes so far, ECG shows minor T wave changes V3-V4, but is substantially better than at time of PCI in April  PLAN: Low risk biochemical and ECG parameters, but symptoms suggest unstable angina, very similar to April presentation and he is in the time zone for in stent restenosis. Cath on Monday. Smoking cessation. He asks for nicotine replacement with a patch.  Thurmon Fair, MD, [facility] [phone] 07/03/2014, 10:48 AM

## 2014-07-03 NOTE — ED Provider Notes (Signed)
CSN: [id number]     Arrival date & time 07/03/14  0803 History   First MD Initiated Contact with Patient 07/03/14 [phone]     Chief Complaint  Patient presents with  . Chest Pain     (Consider location/radiation/quality/duration/timing/severity/associated sxs/prior Treatment) HPI Comments: [age] year old male with history of CAD, 3 cardiac stents, mild decreased ejection fraction, lipids, tobacco use presents with left-sided chest discomfort and pressure for the past 2 days with intermittent exertional dyspnea. This is similar to his previous admission for coronary artery disease and stents. Patient has not come in earlier per his wife he does not like to come in the hospital. Patient has tried GI symptoms and nitroglycerin symptomatic treatment with minimal relief. Patient denies blood clot history, leg swelling or leg pain, active cancer, hemoptysis. Chest discomfort radiates down left arm and neck.  Patient is a 53 y.o. male presenting with chest pain. The history is provided by the patient.  Chest Pain Associated symptoms: cough and shortness of breath   Associated symptoms: no abdominal pain, no back pain, no fever, no headache, no palpitations and not vomiting     Past Medical History  Diagnosis Date  . GERD (gastroesophageal reflux disease)   . CAD (coronary artery disease), 12/29/13 PCI/DES LCX and PCI/DES LAD with overlapping DES  12/30/2013  . Abnormal EKG 12/30/2013  . LV dysfunction, EF by cath 50-55% 12/30/2013  . Dyslipidemia, goal LDL below 70 12/30/2013  . Metabolic syndrome, HgbA1C 6.0  12/30/2013   Past Surgical History  Procedure Laterality Date  . Coronary angioplasty with stent placement  12/29/13    DES to LCX and overlapping stents DES mid LAD   Family History  Problem Relation Age of Onset  . Heart attack Brother     Deceased  . Heart attack Brother   . Stroke Sister   . Diabetes Father     also heart disease  . Diabetes Mother   . Cancer Father     Deceased  . Cancer  Mother     Deceased   History  Substance Use Topics  . Smoking status: Current Every Day Smoker -- 1.00 packs/day for 30 years    Types: Cigarettes  . Smokeless tobacco: Never Used     Comment: down to 7 cigarettes/daily - uses nicotine patch QD (01/08/14)  . Alcohol Use: Yes     Comment: "Once in a blue moon"    Review of Systems  Constitutional: Positive for appetite change. Negative for fever and chills.  HENT: Negative for congestion.   Eyes: Negative for visual disturbance.  Respiratory: Positive for cough and shortness of breath.   Cardiovascular: Positive for chest pain. Negative for palpitations and leg swelling.  Gastrointestinal: Negative for vomiting and abdominal pain.  Genitourinary: Negative for dysuria and flank pain.  Musculoskeletal: Negative for back pain, neck pain and neck stiffness.  Skin: Negative for rash.  Neurological: Negative for light-headedness and headaches.      Allergies  Review of patient's allergies indicates no known allergies.  Home Medications   Prior to Admission medications   Medication Sig Start Date End Date Taking? Authorizing Provider  acetaminophen (TYLENOL) 500 MG tablet Take 500 mg by mouth daily.   Yes Historical Provider, MD  aspirin 81 MG chewable tablet Chew 1 tablet (81 mg total) by mouth daily. 12/30/13  Yes Leone BrandLaura R Ingold, NP  atorvastatin (LIPITOR) 40 MG tablet Take 1 tablet (40 mg total) by mouth daily at 6 PM. 12/30/13  Yes Vernona RiegerLaura  Ebony Hail, NP  carvedilol (COREG) 3.125 MG tablet Take 1 tablet (3.125 mg total) by mouth 2 (two) times daily with a meal. 12/30/13  Yes Leone Brand, NP  nitroGLYCERIN (NITROSTAT) 0.4 MG SL tablet Place 1 tablet (0.4 mg total) under the tongue every 5 (five) minutes x 3 doses as needed for chest pain. 12/30/13  Yes Leone Brand, NP  omeprazole (PRILOSEC OTC) 20 MG tablet Take 20 mg by mouth daily.   Yes Historical Provider, MD  ticagrelor (BRILINTA) 90 MG TABS tablet Take 90 mg by mouth 2 (two) times  daily.   Yes Historical Provider, MD   BP 113/66  Pulse 67  Temp(Src) 97.3 F (36.3 C) (Oral)  Resp 18  Ht 6' (1.829 m)  Wt 215 lb (97.523 kg)  BMI 29.15 kg/m2  SpO2 99% Physical Exam  Nursing note and vitals reviewed. Constitutional: He is oriented to person, place, and time. He appears well-developed and well-nourished.  HENT:  Head: Normocephalic and atraumatic.  Eyes: Conjunctivae are normal. Right eye exhibits no discharge. Left eye exhibits no discharge.  Neck: Normal range of motion. Neck supple. No tracheal deviation present.  Cardiovascular: Normal rate, regular rhythm and intact distal pulses.   Pulmonary/Chest: Effort normal and breath sounds normal.  Abdominal: Soft. He exhibits no distension. There is no tenderness. There is no guarding.  Musculoskeletal: He exhibits no edema.  Neurological: He is alert and oriented to person, place, and time.  Skin: Skin is warm. No rash noted.  Psychiatric: He has a normal mood and affect.    ED Course  Procedures (including critical care time) Labs Review Labs Reviewed  CBC - Abnormal; Notable for the following:    WBC 11.8 (*)    All other components within normal limits  BASIC METABOLIC PANEL - Abnormal; Notable for the following:    Glucose, Bld 124 (*)    All other components within normal limits  TROPONIN I  HEPARIN LEVEL (UNFRACTIONATED)  BASIC METABOLIC PANEL  LIPID PANEL  CBC  PROTIME-INR  HEPARIN LEVEL (UNFRACTIONATED)  CBC  I-STAT TROPOININ, ED    Imaging Review No results found.   EKG Interpretation   Date/Time:  Saturday July 03 2014 08:06:17 EDT Ventricular Rate:  78 PR Interval:  186 QRS Duration: 94 QT Interval:  414 QTC Calculation: 471 R Axis:   29 Text Interpretation:  Normal sinus rhythm Normal ECG improved since  previous, ST/twave flattening V2/3 Confirmed by Hisayo Delossantos  MD, Kamdyn Covel ([id number])  on 07/03/2014 8:40:36 AM      MDM   Final diagnoses:  Chest pain with moderate risk of  acute coronary syndrome  Coronary artery disease involving native coronary artery of native heart with unstable angina pectoris  LV dysfunction  Tobacco use   With patient's medical history and recent stent placement concern for cardiac as etiology for chest pain and exertional dyspnea. Concern for angina/unstable angina. Aspirin ordered and cardiology consult at early on in his care. Discuss smoking cessation with the patient with his wife in the room who has tried to help motivate him. Initial troponin negative, formal sent, morphine for pain. EKG improved since previous. Cardiology will see and likely admit telemetry.  The patients results and plan were reviewed and discussed.   Any x-rays performed were personally reviewed by myself.   Differential diagnosis were considered with the presenting HPI.  Medications  morphine 4 MG/ML injection 6 mg (6 mg Intravenous Given 07/03/14 0846)  aspirin chewable tablet 324 mg (324  mg Oral Given 07/03/14 0845)    Filed Vitals:   07/03/14 7829 07/03/14 0816 07/03/14 0851  BP:  117/74 113/66  Pulse:  73 67  Temp:  97.3 F (36.3 C)   TempSrc:  Oral   Resp:  20 18  Height: 6' (1.829 m)    Weight: 215 lb (97.523 kg)    SpO2:  100% 99%    Final diagnoses:  Chest pain with moderate risk of acute coronary syndrome  Coronary artery disease involving native coronary artery of native heart with unstable angina pectoris  LV dysfunction  Tobacco use    Admission/ observation were discussed with the admitting physician, patient and/or family and they are comfortable with the plan.      Enid Skeens, MD 07/03/14 [phone]

## 2014-07-03 NOTE — Progress Notes (Signed)
ANTICOAGULATION CONSULT NOTE - Initial Consult  Pharmacy Consult for heparin Indication: chest pain/ACS  No Known Allergies  Patient Measurements: Height: 6' (182.9 cm) Weight: 215 lb (97.523 kg) IBW/kg (Calculated) : 77.6 Heparin Dosing Weight:   Vital Signs: Temp: 97.3 F (36.3 C) (10/03 0901) Temp Source: Oral (10/03 0901) BP: 110/70 mmHg (10/03 0915) Pulse Rate: 58 (10/03 0915)  Labs:  Recent Labs  07/03/14 0826 07/03/14 0838  HGB 14.5  --   HCT 42.4  --   PLT 237  --   CREATININE 0.98  --   TROPONINI  --  <0.30    Estimated Creatinine Clearance: 104.3 ml/min (by C-G formula based on Cr of 0.98).   Medical History: Past Medical History  Diagnosis Date  . GERD (gastroesophageal reflux disease)   . CAD (coronary artery disease), 12/29/13 PCI/DES LCX and PCI/DES LAD with overlapping DES  12/30/2013  . Abnormal EKG 12/30/2013  . LV dysfunction, EF by cath 50-55% 12/30/2013  . Dyslipidemia, goal LDL below 70 12/30/2013  . Metabolic syndrome, HgbA1C 6.0  12/30/2013    Medications:  Infusions:  . heparin    . heparin      Assessment: Alan White presented to the hospital with chest pain. Baseline CBC is WNL. He is not on any anticoagulation PTA. To start IV heparin for anticoagulation.   Goal of Therapy:  Heparin level 0.3-0.7 units/ml Monitor platelets by anticoagulation protocol: Yes   Plan:  1. Heparin bolus 4000 units IV x 1 2. Heparin gtt 1400 units/hr 3. Check a 6 hour HL 4. Daily HL and CBC  Shemica Meath, Drake Leach 07/03/2014,10:39 AM

## 2014-07-03 NOTE — ED Notes (Signed)
MD at bedside. 

## 2014-07-03 NOTE — ED Notes (Signed)
Patient transported to X-ray 

## 2014-07-03 NOTE — Progress Notes (Signed)
Patient admitted via ER. Patient alert and oriented x4, patient has family by bed side. Patient oriented to room and made comfortable

## 2014-07-03 NOTE — Progress Notes (Signed)
ANTICOAGULATION CONSULT NOTE  Pharmacy Consult for heparin Indication: chest pain/ACS  No Known Allergies  Labs:  Recent Labs  07/03/14 0826 07/03/14 0838 07/03/14 1710  HGB 14.5  --   --   HCT 42.4  --   --   PLT 237  --   --   HEPARINUNFRC  --   --  0.62  CREATININE 0.98  --   --   TROPONINI  --  <0.30  --     Estimated Creatinine Clearance: 102.7 ml/min (by C-G formula based on Cr of 0.98).  Assessment: 52 yom presented to the hospital with chest pain. Baseline CBC is WNL. He is not on any anticoagulation PTA. To start IV heparin for anticoagulation.   Heparin level therapeutic  Goal of Therapy:  Heparin level 0.3-0.7 units/ml Monitor platelets by anticoagulation protocol: Yes   Plan:  Continue heparin at 1400 units / hr Follow up AM labs  Thank you. Okey Regal, PharmD [phone] 07/03/2014,5:40 PM

## 2014-07-03 NOTE — ED Notes (Signed)
Pt c/o left sided chest pain radiating to left shoulder and down left arm x 2 days. Pt reports pain constant and sharp at times. Pt had stents placed 12/28/2013.

## 2014-07-04 DIAGNOSIS — E785 Hyperlipidemia, unspecified: Secondary | ICD-10-CM

## 2014-07-04 LAB — BASIC METABOLIC PANEL WITH GFR
Anion gap: 11 (ref 5–15)
BUN: 18 mg/dL (ref 6–23)
CO2: 25 meq/L (ref 19–32)
Calcium: 9 mg/dL (ref 8.4–10.5)
Chloride: 105 meq/L (ref 96–112)
Creatinine, Ser: 1.17 mg/dL (ref 0.50–1.35)
GFR calc Af Amer: 80 mL/min — ABNORMAL LOW
GFR calc non Af Amer: 69 mL/min — ABNORMAL LOW
Glucose, Bld: 107 mg/dL — ABNORMAL HIGH (ref 70–99)
Potassium: 3.7 meq/L (ref 3.7–5.3)
Sodium: 141 meq/L (ref 137–147)

## 2014-07-04 LAB — CBC
HCT: 37.9 % — ABNORMAL LOW (ref 39.0–52.0)
Hemoglobin: 13.1 g/dL (ref 13.0–17.0)
MCH: 31.2 pg (ref 26.0–34.0)
MCHC: 34.6 g/dL (ref 30.0–36.0)
MCV: 90.2 fL (ref 78.0–100.0)
Platelets: 202 10*3/uL (ref 150–400)
RBC: 4.2 MIL/uL — ABNORMAL LOW (ref 4.22–5.81)
RDW: 13.5 % (ref 11.5–15.5)
WBC: 10.1 10*3/uL (ref 4.0–10.5)

## 2014-07-04 LAB — LIPID PANEL
Cholesterol: 100 mg/dL (ref 0–200)
HDL: 26 mg/dL — ABNORMAL LOW
LDL Cholesterol: 49 mg/dL (ref 0–99)
Total CHOL/HDL Ratio: 3.8 ratio
Triglycerides: 125 mg/dL
VLDL: 25 mg/dL (ref 0–40)

## 2014-07-04 LAB — HEPARIN LEVEL (UNFRACTIONATED)
HEPARIN UNFRACTIONATED: 0.66 [IU]/mL (ref 0.30–0.70)
Heparin Unfractionated: 0.71 [IU]/mL — ABNORMAL HIGH (ref 0.30–0.70)

## 2014-07-04 LAB — PROTIME-INR
INR: 1.06 (ref 0.00–1.49)
Prothrombin Time: 13.8 seconds (ref 11.6–15.2)

## 2014-07-04 MED ORDER — SODIUM CHLORIDE 0.9 % IV SOLN
INTRAVENOUS | Status: DC
  Administered 2014-07-05 (×2): via INTRAVENOUS

## 2014-07-04 NOTE — Progress Notes (Signed)
ANTICOAGULATION CONSULT NOTE - Follow Up Consult  Pharmacy Consult for heparin Indication: chest pain/ACS  No Known Allergies  Patient Measurements: Height: 6' (182.9 cm) Weight: 208 lb (94.348 kg) IBW/kg (Calculated) : 77.6 Heparin Dosing Weight: 97 kg  Vital Signs: Temp: 97.6 F (36.4 C) (10/04 0437) Temp Source: Oral (10/04 0437) BP: 107/66 mmHg (10/04 0848) Pulse Rate: 68 (10/04 0848)  Labs:  Recent Labs  07/03/14 0826 07/03/14 0838 07/03/14 1710 07/04/14 0357 07/04/14 1230  HGB 14.5  --   --  13.1  --   HCT 42.4  --   --  37.9*  --   PLT 237  --   --  202  --   LABPROT  --   --   --  13.8  --   INR  --   --   --  1.06  --   HEPARINUNFRC  --   --  0.62 0.71* 0.66  CREATININE 0.98  --   --  1.17  --   TROPONINI  --  <0.30  --   --   --     Estimated Creatinine Clearance: 86.1 ml/min (by C-G formula based on Cr of 1.17).  Assessment: Patient is a 53 y.o M on heparin for CP with plan for cath on 10/05.  Heparin level now therapeutic at 0.66 with rate decreased to 1300 units/hr.  No bleeding documented  Goal of Therapy:  Heparin level 0.3-0.7 units/ml Monitor platelets by anticoagulation protocol: Yes   Plan:  1) continue heparin rate at 1300 units/hr  Jimy Gates P 07/04/2014,1:38 PM

## 2014-07-04 NOTE — Progress Notes (Signed)
ANTICOAGULATION CONSULT NOTE - Follow Up Consult  Pharmacy Consult for Heparin  Indication: chest pain/ACS  No Known Allergies  Patient Measurements: Height: 6' (182.9 cm) Weight: 208 lb (94.348 kg) IBW/kg (Calculated) : 77.6  Vital Signs: Temp: 97.6 F (36.4 C) (10/04 0437) Temp Source: Oral (10/04 0437) BP: 101/67 mmHg (10/04 0437) Pulse Rate: 64 (10/04 0437)  Labs:  Recent Labs  07/03/14 0826 07/03/14 0838 07/03/14 1710 07/04/14 0357  HGB 14.5  --   --  13.1  HCT 42.4  --   --  37.9*  PLT 237  --   --  202  LABPROT  --   --   --  13.8  INR  --   --   --  1.06  HEPARINUNFRC  --   --  0.62 0.71*  CREATININE 0.98  --   --   --   TROPONINI  --  <0.30  --   --     Estimated Creatinine Clearance: 102.7 ml/min (by C-G formula based on Cr of 0.98).  Assessment: 53 y/o M on heparin for CP. HL is slightly elevated this AM. Other labs as above. No issues per RN.   Goal of Therapy:  Heparin level 0.3-0.7 units/ml Monitor platelets by anticoagulation protocol: Yes   Plan:  -Decrease heparin to 1300 units/hr -1200 HL -Daily CBC/HL -Monitor for bleeding  Alan White 07/04/2014,4:39 AM

## 2014-07-04 NOTE — Progress Notes (Signed)
Subjective: No further CP.  Chest feels sore to touch left of sternum  Objective: Vital signs in last 24 hours: Temp:  [97.6 F (36.4 C)-97.8 F (36.6 C)] 97.6 F (36.4 C) (10/04 0437) Pulse Rate:  [55-68] 68 (10/04 0848) Resp:  [13-20] 18 (10/04 0848) BP: (99-125)/(61-78) 107/66 mmHg (10/04 0848) SpO2:  [95 %-98 %] 96 % (10/04 0848) Weight:  [208 lb (94.348 kg)] 208 lb (94.348 kg) (10/03 1148) Last BM Date: 07/03/14  Intake/Output from previous day: 10/03 0701 - 10/04 0700 In: 3 [I.V.:3] Out: -  Intake/Output this shift: Total I/O In: 120 [P.O.:120] Out: -   Medications Current Facility-Administered Medications  Medication Dose Route Frequency Provider Last Rate Last Dose  . acetaminophen (TYLENOL) tablet 500 mg  500 mg Oral Daily Abelino Derrick, PA-C   500 mg at 07/03/14 1115  . acetaminophen (TYLENOL) tablet 650 mg  650 mg Oral Q4H PRN Abelino Derrick, PA-C      . ALPRAZolam Prudy Feeler) tablet 0.25 mg  0.25 mg Oral TID Abelino Derrick, PA-C   0.25 mg at 07/03/14 1630  . aspirin chewable tablet 81 mg  81 mg Oral Daily Luke K Kilroy, PA-C      . atorvastatin (LIPITOR) tablet 40 mg  40 mg Oral q1800 Eda Paschal Kilroy, PA-C   40 mg at 07/03/14 1634  . carvedilol (COREG) tablet 3.125 mg  3.125 mg Oral BID WC Eda Paschal Kilroy, PA-C   3.125 mg at 07/04/14 0850  . heparin ADULT infusion 100 units/mL (25000 units/250 mL)  1,300 Units/hr Intravenous Continuous Abran Duke, RPH 13 mL/hr at 07/04/14 0441 1,300 Units/hr at 07/04/14 0441  . morphine 4 MG/ML injection 4 mg  4 mg Intravenous Q4H PRN Abelino Derrick, PA-C      . nicotine (NICODERM CQ - dosed in mg/24 hours) patch 21 mg  21 mg Transdermal Daily Abelino Derrick, PA-C   21 mg at 07/03/14 1115  . nitroGLYCERIN (NITROGLYN) 2 % ointment 0.5 inch  0.5 inch Topical 4 times per day Abelino Derrick, PA-C   0.5 inch at 07/04/14 0555  . nitroGLYCERIN (NITROSTAT) SL tablet 0.4 mg  0.4 mg Sublingual Q5 Min x 3 PRN Eda Paschal Kilroy, PA-C      . ondansetron  [facility]) injection 4 mg  4 mg Intravenous Q6H PRN Eda Paschal Kilroy, PA-C      . pantoprazole (PROTONIX) EC tablet 40 mg  40 mg Oral Daily Abelino Derrick, PA-C   40 mg at 07/03/14 1315  . sodium chloride 0.9 % injection 3 mL  3 mL Intravenous Q12H Abelino Derrick, PA-C   3 mL at 07/03/14 1107  . ticagrelor (BRILINTA) tablet 90 mg  90 mg Oral BID Abelino Derrick, PA-C   90 mg at 07/03/14 2117  . zolpidem (AMBIEN) tablet 5 mg  5 mg Oral QHS PRN,MR X 1 Abelino Derrick, PA-C        PE: General appearance: alert, cooperative and no distress Lungs: minimal crackles bilaterally. No wheeze Chest:  Mildly tender. Left of sternum Heart: regular rate and rhythm, S1, S2 normal, no murmur, click, rub or gallop Extremities: No LEE Pulses: 2+ and symmetric Skin: Warm and dry. Neurologic: Grossly normal  Lab Results:   Recent Labs  07/03/14 0826 07/04/14 0357  WBC 11.8* 10.1  HGB 14.5 13.1  HCT 42.4 37.9*  PLT 237 202   BMET  Recent Labs  07/03/14 0826 07/04/14 0357  NA  140 141  K 4.0 3.7  CL 103 105  CO2 24 25  GLUCOSE 124* 107*  BUN 12 18  CREATININE 0.98 1.17  CALCIUM 9.2 9.0   PT/INR  Recent Labs  07/04/14 0357  LABPROT 13.8  INR 1.06   Cholesterol  Recent Labs  07/04/14 0357  CHOL 100   Lipid Panel     Component Value Date/Time   CHOL 100 07/04/2014 0357   TRIG 125 07/04/2014 0357   HDL 26* 07/04/2014 0357   CHOLHDL 3.8 07/04/2014 0357   VLDL 25 07/04/2014 0357   LDLCALC 49 07/04/2014 0357   Cardiac Panel (last 3 results)  Recent Labs  07/03/14 0838  TROPONINI <0.30     Assessment/Plan  Principal Problem:   Chest pain with moderate risk of acute coronary syndrome Active Problems:   Tobacco use   CAD (coronary artery disease), 12/29/13 PCI/DES LCX and PCI/DES LAD with overlapping DES    LV dysfunction, EF by cath 50-55%, by Echo 45-50%   Dyslipidemia, goal LDL below 70   Metabolic syndrome, HgbA1C 6.0    Family history of coronary artery disease  Plan:  On  IV heparin and topical nitrates.  Cath on Monday.  Bp and HR controlled.  ASA, Brilinta, coreg 3.125mg BID, lipitor.  LDL 49.   2.76 and 2.5 second pauses on tele between 0300 and 0400hrs.  Probably Sleep apnea.  He said his wife has said he stops breathing when he sleeps.  Will need sleep study after DC.       LOS: 1 day    HAGER, BRYAN PA-C 07/04/2014 9:24 AM  Personally seen and examined. Agree with above. Cath Monday. Watch pauses. ?OSA SKAINS, MARK, MD  

## 2014-07-04 NOTE — Progress Notes (Signed)
Patient will have Cath done tomorrow, per MD.  Will continue to monitor patient.

## 2014-07-05 ENCOUNTER — Encounter (HOSPITAL_COMMUNITY): Payer: BC Managed Care – PPO

## 2014-07-05 ENCOUNTER — Encounter (HOSPITAL_COMMUNITY)
Admission: EM | Disposition: A | Discharge status: Home / Self Care | Payer: Self-pay | Source: Home / Self Care | Attending: Cardiovascular Disease

## 2014-07-05 DIAGNOSIS — I2510 Atherosclerotic heart disease of native coronary artery without angina pectoris: Secondary | ICD-10-CM

## 2014-07-05 HISTORY — PX: CARDIAC CATHETERIZATION: SHX172

## 2014-07-05 HISTORY — PX: LEFT HEART CATHETERIZATION WITH CORONARY ANGIOGRAM: SHX5451

## 2014-07-05 LAB — CBC
HCT: 37.3 % — ABNORMAL LOW (ref 39.0–52.0)
Hemoglobin: 12.8 g/dL — ABNORMAL LOW (ref 13.0–17.0)
MCH: 30.8 pg (ref 26.0–34.0)
MCHC: 34.3 g/dL (ref 30.0–36.0)
MCV: 89.7 fL (ref 78.0–100.0)
Platelets: 205 10*3/uL (ref 150–400)
RBC: 4.16 MIL/uL — ABNORMAL LOW (ref 4.22–5.81)
RDW: 13.4 % (ref 11.5–15.5)
WBC: 9.3 10*3/uL (ref 4.0–10.5)

## 2014-07-05 LAB — HEPARIN LEVEL (UNFRACTIONATED): Heparin Unfractionated: 0.39 IU/mL (ref 0.30–0.70)

## 2014-07-05 SURGERY — LEFT HEART CATHETERIZATION WITH CORONARY ANGIOGRAM
Anesthesia: LOCAL

## 2014-07-05 MED ORDER — HEPARIN (PORCINE) IN NACL 2-0.9 UNIT/ML-% IJ SOLN
INTRAMUSCULAR | Status: AC
  Filled 2014-07-05: qty 1500

## 2014-07-05 MED ORDER — SODIUM CHLORIDE 0.9 % IV SOLN
1.0000 mL/kg/h | INTRAVENOUS | Status: AC
  Administered 2014-07-05: 1 mL/kg/h via INTRAVENOUS

## 2014-07-05 MED ORDER — ASPIRIN 81 MG PO CHEW
81.0000 mg | CHEWABLE_TABLET | ORAL | Status: AC
  Administered 2014-07-05: 81 mg via ORAL
  Filled 2014-07-05: qty 1

## 2014-07-05 MED ORDER — ASPIRIN 81 MG PO CHEW
81.0000 mg | CHEWABLE_TABLET | Freq: Every day | ORAL | Status: DC
  Administered 2014-07-05: 81 mg via ORAL
  Filled 2014-07-05: qty 1

## 2014-07-05 MED ORDER — VERAPAMIL HCL 2.5 MG/ML IV SOLN
INTRAVENOUS | Status: AC
  Filled 2014-07-05: qty 2

## 2014-07-05 MED ORDER — FENTANYL CITRATE 0.05 MG/ML IJ SOLN
INTRAMUSCULAR | Status: AC
  Filled 2014-07-05: qty 2

## 2014-07-05 MED ORDER — SODIUM CHLORIDE 0.9 % IV SOLN
250.0000 mL | INTRAVENOUS | Status: DC | PRN
Start: 2014-07-05 — End: 2014-07-05

## 2014-07-05 MED ORDER — SODIUM CHLORIDE 0.9 % IJ SOLN: 3.0000 mL | INTRAMUSCULAR | Status: DC | PRN

## 2014-07-05 MED ORDER — NITROGLYCERIN 1 MG/10 ML FOR IR/CATH LAB
INTRA_ARTERIAL | Status: AC
  Filled 2014-07-05: qty 10

## 2014-07-05 MED ORDER — LIDOCAINE HCL (PF) 1 % IJ SOLN
INTRAMUSCULAR | Status: AC
  Filled 2014-07-05: qty 30

## 2014-07-05 MED ORDER — MIDAZOLAM HCL 2 MG/2ML IJ SOLN
INTRAMUSCULAR | Status: AC
  Filled 2014-07-05: qty 2

## 2014-07-05 MED ORDER — HEPARIN SODIUM (PORCINE) 1000 UNIT/ML IJ SOLN
INTRAMUSCULAR | Status: AC
  Filled 2014-07-05: qty 1

## 2014-07-05 MED ORDER — SODIUM CHLORIDE 0.9 % IJ SOLN: 3.0000 mL | Freq: Two times a day (BID) | INTRAMUSCULAR | Status: DC

## 2014-07-05 NOTE — Interval H&P Note (Signed)
History and Physical Interval Note:  07/05/2014 4:53 PM  Alease Medina  has presented today for surgery, with the diagnosis of unstable angina  The various methods of treatment have been discussed with the patient and family. After consideration of risks, benefits and other options for treatment, the patient has consented to  Procedure(s): LEFT HEART CATHETERIZATION WITH CORONARY ANGIOGRAM (N/A) as a surgical intervention .  The patient's history has been reviewed, patient examined, no change in status, stable for surgery.  I have reviewed the patient's chart and labs.  Questions were answered to the patient's satisfaction.   Cath Lab Visit (complete for each Cath Lab visit)  Clinical Evaluation Leading to the Procedure:   ACS: Yes.    Non-ACS:    Anginal Classification: CCS III  Anti-ischemic medical therapy: Minimal Therapy (1 class of medications)  Non-Invasive Test Results: No non-invasive testing performed  Prior CABG: No previous CABG        Theron Arista [facility] 07/05/2014 4:53 PM

## 2014-07-05 NOTE — Progress Notes (Signed)
Utilization Review Completed.Oval Moralez T10/01/2014  

## 2014-07-05 NOTE — Progress Notes (Signed)
Patient had a 4.68 second pause visible on Tele monitor at 0445 - I attempted to reach MD at answering service and was given a number to call of [phone] - I attempted to call this number twice with no answer. Patients vitals were normal. Patient was sleeping soundly. Cath scheduler made aware when she made round on the floor. Patient prepped for cath lab. Stephenie Acres

## 2014-07-05 NOTE — H&P (View-Only) (Signed)
Subjective: No further CP.  Chest feels sore to touch left of sternum  Objective: Vital signs in last 24 hours: Temp:  [97.6 F (36.4 C)-97.8 F (36.6 C)] 97.6 F (36.4 C) (10/04 0437) Pulse Rate:  [55-68] 68 (10/04 0848) Resp:  [13-20] 18 (10/04 0848) BP: (99-125)/(61-78) 107/66 mmHg (10/04 0848) SpO2:  [95 %-98 %] 96 % (10/04 0848) Weight:  [208 lb (94.348 kg)] 208 lb (94.348 kg) (10/03 1148) Last BM Date: 07/03/14  Intake/Output from previous day: 10/03 0701 - 10/04 0700 In: 3 [I.V.:3] Out: -  Intake/Output this shift: Total I/O In: 120 [P.O.:120] Out: -   Medications Current Facility-Administered Medications  Medication Dose Route Frequency Provider Last Rate Last Dose  . acetaminophen (TYLENOL) tablet 500 mg  500 mg Oral Daily Abelino Derrick, PA-C   500 mg at 07/03/14 1115  . acetaminophen (TYLENOL) tablet 650 mg  650 mg Oral Q4H PRN Abelino Derrick, PA-C      . ALPRAZolam Prudy Feeler) tablet 0.25 mg  0.25 mg Oral TID Abelino Derrick, PA-C   0.25 mg at 07/03/14 1630  . aspirin chewable tablet 81 mg  81 mg Oral Daily Luke K Kilroy, PA-C      . atorvastatin (LIPITOR) tablet 40 mg  40 mg Oral q1800 Eda Paschal Kilroy, PA-C   40 mg at 07/03/14 1634  . carvedilol (COREG) tablet 3.125 mg  3.125 mg Oral BID WC Eda Paschal Kilroy, PA-C   3.125 mg at 07/04/14 0850  . heparin ADULT infusion 100 units/mL (25000 units/250 mL)  1,300 Units/hr Intravenous Continuous Abran Duke, RPH 13 mL/hr at 07/04/14 0441 1,300 Units/hr at 07/04/14 0441  . morphine 4 MG/ML injection 4 mg  4 mg Intravenous Q4H PRN Abelino Derrick, PA-C      . nicotine (NICODERM CQ - dosed in mg/24 hours) patch 21 mg  21 mg Transdermal Daily Abelino Derrick, PA-C   21 mg at 07/03/14 1115  . nitroGLYCERIN (NITROGLYN) 2 % ointment 0.5 inch  0.5 inch Topical 4 times per day Abelino Derrick, PA-C   0.5 inch at 07/04/14 0555  . nitroGLYCERIN (NITROSTAT) SL tablet 0.4 mg  0.4 mg Sublingual Q5 Min x 3 PRN Eda Paschal Kilroy, PA-C      . ondansetron  [facility]) injection 4 mg  4 mg Intravenous Q6H PRN Eda Paschal Kilroy, PA-C      . pantoprazole (PROTONIX) EC tablet 40 mg  40 mg Oral Daily Abelino Derrick, PA-C   40 mg at 07/03/14 1315  . sodium chloride 0.9 % injection 3 mL  3 mL Intravenous Q12H Abelino Derrick, PA-C   3 mL at 07/03/14 1107  . ticagrelor (BRILINTA) tablet 90 mg  90 mg Oral BID Abelino Derrick, PA-C   90 mg at 07/03/14 2117  . zolpidem (AMBIEN) tablet 5 mg  5 mg Oral QHS PRN,MR X 1 Abelino Derrick, PA-C        PE: General appearance: alert, cooperative and no distress Lungs: minimal crackles bilaterally. No wheeze Chest:  Mildly tender. Left of sternum Heart: regular rate and rhythm, S1, S2 normal, no murmur, click, rub or gallop Extremities: No LEE Pulses: 2+ and symmetric Skin: Warm and dry. Neurologic: Grossly normal  Lab Results:   Recent Labs  07/03/14 0826 07/04/14 0357  WBC 11.8* 10.1  HGB 14.5 13.1  HCT 42.4 37.9*  PLT 237 202   BMET  Recent Labs  07/03/14 0826 07/04/14 0357  NA  140 141  K 4.0 3.7  CL 103 105  CO2 24 25  GLUCOSE 124* 107*  BUN 12 18  CREATININE 0.98 1.17  CALCIUM 9.2 9.0   PT/INR  Recent Labs  07/04/14 0357  LABPROT 13.8  INR 1.06   Cholesterol  Recent Labs  07/04/14 0357  CHOL 100   Lipid Panel     Component Value Date/Time   CHOL 100 07/04/2014 0357   TRIG 125 07/04/2014 0357   HDL 26* 07/04/2014 0357   CHOLHDL 3.8 07/04/2014 0357   VLDL 25 07/04/2014 0357   LDLCALC 49 07/04/2014 0357   Cardiac Panel (last 3 results)  Recent Labs  07/03/14 0838  TROPONINI <0.30     Assessment/Plan  Principal Problem:   Chest pain with moderate risk of acute coronary syndrome Active Problems:   Tobacco use   CAD (coronary artery disease), 12/29/13 PCI/DES LCX and PCI/DES LAD with overlapping DES    LV dysfunction, EF by cath 50-55%, by Echo 45-50%   Dyslipidemia, goal LDL below 70   Metabolic syndrome, HgbA1C 6.0    Family history of coronary artery disease  Plan:  On  IV heparin and topical nitrates.  Cath on Monday.  Bp and HR controlled.  ASA, Brilinta, coreg 3.125mg  BID, lipitor.  LDL 49.   2.76 and 2.5 second pauses on tele between 0300 and 0400hrs.  Probably Sleep apnea.  He said his wife has said he stops breathing when he sleeps.  Will need sleep study after DC.       LOS: 1 day    HAGER, BRYAN PA-C 07/04/2014 9:24 AM  Personally seen and examined. Agree with above. Cath Monday. Watch pauses. ?OSA SKAINS, MARK, MD

## 2014-07-05 NOTE — CV Procedure (Signed)
    Cardiac Catheterization Procedure Note  Name: Burke Hazzard MRN: [id number] DOB: Jan 30, 1961  Procedure: Left Heart Cath, Selective Coronary Angiography, LV angiography  Indication: 53 yo WM with history of CAD s/p stenting of the LCx and LAD in 3/15 presents with similar chest pain.   Procedural Details: The right wrist was prepped, draped, and anesthetized with 1% lidocaine. Using the modified Seldinger technique, a 6 French slender sheath was introduced into the right radial artery. 3 mg of verapamil was administered through the sheath, weight-based unfractionated heparin was administered intravenously. Standard Judkins catheters were used for selective coronary angiography and left ventriculography. Catheter exchanges were performed over an exchange length guidewire. There were no immediate procedural complications. A TR band was used for radial hemostasis at the completion of the procedure.  The patient was transferred to the post catheterization recovery area for further monitoring.  Procedural Findings: Hemodynamics: AO 125/70 mean 94 mm Hg LV 121/15 mm Hg  Coronary angiography: Coronary dominance: right  Left mainstem: Short, Normal.   Left anterior descending (LAD): 20-30% proximal LAD disease. The stents in the mid LAD are widely patent. There is 20% focal disease distal to the stent.  Left circumflex (LCx): The stents in the mid LCx are widely patent.   Right coronary artery (RCA): Mild disease in the proximal to mid vessel up to 20%.   Left ventriculography: Left ventricular systolic function is normal, LVEF is estimated at 55-65%, there is no significant mitral regurgitation   Final Conclusions:   1. Nonobstructive CAD. Continued patency of stents in the LAD and LCx. 2. Normal LV function.  Recommendations: Continue medical therapy. Consider noncardiac causes of chest pain.  Peter [place], MDFACC  07/05/2014, 5:18 PM

## 2014-07-05 NOTE — Progress Notes (Signed)
ANTICOAGULATION CONSULT NOTE - Follow Up Consult  Pharmacy Consult for heparin Indication: chest pain/ACS  No Known Allergies  Patient Measurements: Height: 6' (182.9 cm) Weight: 207 lb 0.2 oz (93.9 kg) IBW/kg (Calculated) : 77.6 Heparin Dosing Weight: 97 kg  Vital Signs: Temp: 98 F (36.7 C) (10/05 0457) Temp Source: Oral (10/05 0457) BP: 106/69 mmHg (10/05 0457) Pulse Rate: 65 (10/05 0457)  Labs:  Recent Labs  07/03/14 0826 07/03/14 0838  07/04/14 0357 07/04/14 1230 07/05/14 0350  HGB 14.5  --   --  13.1  --  12.8*  HCT 42.4  --   --  37.9*  --  37.3*  PLT 237  --   --  202  --  205  LABPROT  --   --   --  13.8  --   --   INR  --   --   --  1.06  --   --   HEPARINUNFRC  --   --   < > 0.71* 0.66 0.39  CREATININE 0.98  --   --  1.17  --   --   TROPONINI  --  <0.30  --   --   --   --   < > = values in this interval not displayed.  Estimated Creatinine Clearance: 85.9 ml/min (by C-G formula based on Cr of 1.17).  Assessment: Patient is a 53 y.o M on heparin for CP with plan for cath on 10/05.  Heparin level therapeutic at 0.39 this morning on 1300 units/hr. No bleeding documented  Goal of Therapy:  Heparin level 0.3-0.7 units/ml Monitor platelets by anticoagulation protocol: Yes   Plan:  1) continue heparin rate at 1300 units/hr 2) will f/u after cath.  Bayard Hugger, PharmD, BCPS  Clinical Pharmacist  Pager: [phone]   07/05/2014,9:29 AM

## 2014-07-05 NOTE — Progress Notes (Signed)
Heparin drip stopped for cardiac cath. Darrel Hoover 4:25 PM

## 2014-07-06 ENCOUNTER — Encounter (HOSPITAL_COMMUNITY): Payer: Self-pay | Admitting: Cardiology

## 2014-07-06 ENCOUNTER — Other Ambulatory Visit: Payer: Self-pay | Admitting: *Deleted

## 2014-07-06 DIAGNOSIS — R0681 Apnea, not elsewhere classified: Secondary | ICD-10-CM

## 2014-07-06 DIAGNOSIS — R001 Bradycardia, unspecified: Secondary | ICD-10-CM

## 2014-07-06 DIAGNOSIS — R9431 Abnormal electrocardiogram [ECG] [EKG]: Secondary | ICD-10-CM

## 2014-07-06 DIAGNOSIS — I255 Ischemic cardiomyopathy: Secondary | ICD-10-CM

## 2014-07-06 DIAGNOSIS — G4730 Sleep apnea, unspecified: Secondary | ICD-10-CM | POA: Diagnosis present

## 2014-07-06 LAB — CBC
HCT: 39.4 % (ref 39.0–52.0)
HEMOGLOBIN: 13.5 g/dL (ref 13.0–17.0)
MCH: 30.8 pg (ref 26.0–34.0)
MCHC: 34.3 g/dL (ref 30.0–36.0)
MCV: 89.7 fL (ref 78.0–100.0)
Platelets: 210 10*3/uL (ref 150–400)
RBC: 4.39 MIL/uL (ref 4.22–5.81)
RDW: 13.3 % (ref 11.5–15.5)
WBC: 9.3 10*3/uL (ref 4.0–10.5)

## 2014-07-06 MED ORDER — NICOTINE 14 MG/24HR TD PT24: 14.0000 mg | MEDICATED_PATCH | Freq: Every day | TRANSDERMAL | Status: DC

## 2014-07-06 MED ORDER — NICOTINE 21 MG/24HR TD PT24: 21.0000 mg | MEDICATED_PATCH | Freq: Every day | TRANSDERMAL | Status: AC

## 2014-07-06 MED ORDER — ASPIRIN 81 MG PO CHEW
81.0000 mg | CHEWABLE_TABLET | Freq: Every day | ORAL | Status: DC
  Filled 2014-07-06: qty 1

## 2014-07-06 NOTE — Progress Notes (Signed)
    Subjective:  No chest pain  Objective:  Vital Signs in the last 24 hours: Temp:  [97.4 F (36.3 C)-98.4 F (36.9 C)] 98.1 F (36.7 C) (10/06 0448) Pulse Rate:  [56-70] 69 (10/06 0448) Resp:  [18-20] 18 (10/06 0448) BP: (99-133)/(63-93) 127/67 mmHg (10/06 0448) SpO2:  [95 %-98 %] 95 % (10/06 0448)  Intake/Output from previous day:  Intake/Output Summary (Last 24 hours) at 07/06/14 0938 Last data filed at 07/06/14 0907  Gross per 24 hour  Intake    360 ml  Output      0 ml  Net    360 ml    Physical Exam: General appearance: alert, cooperative and no distress Lungs: clear to auscultation bilaterally Heart: regular rate and rhythm Extremities: Rt radial site without hematoma   Rate: 68  Rhythm: normal sinus rhythm and sinus bradycardia  Lab Results:  Recent Labs  07/05/14 0350 07/06/14 0420  WBC 9.3 9.3  HGB 12.8* 13.5  PLT 205 210    Recent Labs  07/04/14 0357  NA 141  K 3.7  CL 105  CO2 25  GLUCOSE 107*  BUN 18  CREATININE 1.17   No results found for this basename: TROPONINI, CK, MB,  in the last 72 hours  Recent Labs  07/04/14 0357  INR 1.06    Imaging: Imaging results have been reviewed  Cardiac Studies:  Assessment/Plan:  53 y/o male, 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 He smokes a pack a day. He presented in March with [place] and had cath and subsequent LAD and CFX stenting. He presented to the ER with chest pain worrisome for [place], similar to his pre PCI symptoms. He was admitted, started on Heparin and nitrates. He ruled out for an MI. While on telemetry he had some nighttime bradycardia and pauses up to 2.5 seconds. Apprenetly his wife reported he has periods of apnea at night. He will need a sleep study as an OP.  Cath was done 07/05/14 and revealed patent coronaries. He has had no further pain. We discussed smoking cessation.     Principal Problem:   Chest pain with moderate risk of acute coronary  syndrome Active Problems:   CAD- 12/29/13 PCI/DES CFX and LAD- patent 07/05/14   LV dysfunction, EF by cath 50-55%, by Echo 45-50%   Tobacco use   Dyslipidemia, goal LDL below 70   Metabolic syndrome, HgbA1C 6.0    Family history of coronary artery disease   Sleep apnea by history    PLAN:  Discharge- keep apt with Dr Rennis Golden Oct 30th. OP sleep study. Nicoderm patch.   Corine Shelter PA-C Beeper [phone] 07/06/2014, 9:38 AM   I have seen and examined the patient along with Corine Shelter PA-C.  I have reviewed the chart, notes and new data.  I agree with PA's note.  PLAN: Asymptomatic after PCI. Nicotine patch working well for him. Discussed mandatory dual antiplatelet therapy and need to stop smoking permanently. Outpatient sleep study.  Thurmon Fair, MD, [facility] [phone] 07/06/2014, 11:15 AM

## 2014-07-06 NOTE — Discharge Summary (Signed)
Patient ID: Alan MedinaWilliam Lichter,  MRN: [id number], DOB/AGE: Jan 28, 1961 53 y.o.  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 He smokes a pack a day. He presented in March 2015 with unstable angina and had cath and subsequent LAD and CFX stenting. He presented to the ER 07/03/14 with chest pain worrisome for [place]. His symptoms were similar to his pre PCI symptoms. He was admitted, started on Heparin and nitrates. He ruled out for an MI. While on telemetry he had some nighttime bradycardia and pauses up to 2.5 seconds. Apprenetly his wife reported he has periods of apnea at night. He will need a sleep study as an OP. Cath was done 07/05/14 and revealed patent coronaries. He has had no further pain. We discussed smoking cessation at discharge. He is stable for discharge 07/06/14.    Discharge Vitals:  Blood pressure 127/67, pulse 69, temperature 98.1 F (36.7 C), temperature source Oral, resp. rate 18, height 6' (1.829 m), weight 207 lb 0.2 oz (93.9 kg), SpO2 95.00%.    Labs: Results for orders placed during the hospital encounter of 07/03/14 (from the past 24 hour(s))  CBC     Status: None   Collection Time    07/06/14  4:20 AM      Result Value Ref Range   WBC 9.3  4.0 - 10.5 K/uL   RBC 4.39  4.22 - 5.81 MIL/uL   Hemoglobin 13.5  13.0 - 17.0 g/dL   HCT 62.139.4  30.839.0 - 65.752.0 %   MCV 89.7  78.0 - 100.0  fL   MCH 30.8  26.0 - 34.0 pg   MCHC 34.3  30.0 - 36.0 g/dL   RDW 84.613.3  96.211.5 - 95.215.5 %   Platelets 210  150 - 400 K/uL    Disposition:  Follow-up Information   Follow up with Chrystie NoseHILTY,Kenneth C, MD On 07/30/2014. (8:00 )    Specialty:  Cardiology   Contact information:   [address] [place] [address] [phone]       Discharge Medications:    Medication List         acetaminophen 500 MG tablet  Commonly known as:  TYLENOL  Take 500 mg by mouth daily.     aspirin 81 MG chewable tablet  Chew 1 tablet (81 mg total) by mouth daily.     atorvastatin 40 MG tablet  Commonly known as:  LIPITOR  Take 1 tablet (40 mg total) by mouth daily at 6 PM.     carvedilol 3.125 MG tablet  Commonly known as:  COREG  Take 1 tablet (3.125 mg total) by mouth 2 (two) times daily with a meal.     nicotine 21 mg/24hr patch  Commonly known as:  NICODERM CQ - dosed in mg/24 hours  Place 1 patch (21 mg total) onto the skin daily.  nicotine 14 mg/24hr patch  Commonly known as:  NICODERM CQ  Place 1 patch (14 mg total) onto the skin daily.  Start taking on:  07/21/2014     nitroGLYCERIN 0.4 MG SL tablet  Commonly known as:  NITROSTAT  Place 1 tablet (0.4 mg total) under the tongue every 5 (five) minutes x 3 doses as needed for chest pain.     omeprazole 20 MG tablet  Commonly known as:  PRILOSEC OTC  Take 20 mg by mouth daily.     ticagrelor 90 MG Tabs tablet  Commonly known as:  BRILINTA  Take 90 mg by mouth 2 (two) times daily.         Duration of Discharge Encounter: Greater than 30 minutes including physician time.  Jolene Provost PA-C 07/06/2014 9:55 AM

## 2014-07-06 NOTE — Discharge Instructions (Signed)
Smoking Cessation Quitting smoking is important to your health and has many advantages. However, it is not always easy to quit since nicotine is a very addictive drug. Oftentimes, people try 3 times or more before being able to quit. This document explains the best ways for you to prepare to quit smoking. Quitting takes hard work and a lot of effort, but you can do it. ADVANTAGES OF QUITTING SMOKING  You will live longer, feel better, and live better.  Your body will feel the impact of quitting smoking almost immediately.  Within 20 minutes, blood pressure decreases. Your pulse returns to its normal level.  After 8 hours, carbon monoxide levels in the blood return to normal. Your oxygen level increases.  After 24 hours, the chance of having a heart attack starts to decrease. Your breath, hair, and body stop smelling like smoke.  After 48 hours, damaged nerve endings begin to recover. Your sense of taste and smell improve.  After 72 hours, the body is virtually free of nicotine. Your bronchial tubes relax and breathing becomes easier.  After 2 to 12 weeks, lungs can hold more air. Exercise becomes easier and circulation improves.  The risk of having a heart attack, stroke, cancer, or lung disease is greatly reduced.  After 1 year, the risk of coronary heart disease is cut in half.  After 5 years, the risk of stroke falls to the same as a nonsmoker.  After 10 years, the risk of lung cancer is cut in half and the risk of other cancers decreases significantly.  After 15 years, the risk of coronary heart disease drops, usually to the level of a nonsmoker.  If you are pregnant, quitting smoking will improve your chances of having a healthy baby.  The people you live with, especially any children, will be healthier.  You will have extra money to spend on things other than cigarettes. QUESTIONS TO THINK ABOUT BEFORE ATTEMPTING TO QUIT You may want to talk about your answers with your  health care provider.  Why do you want to quit?  If you tried to quit in the past, what helped and what did not?  What will be the most difficult situations for you after you quit? How will you plan to handle them?  Who can help you through the tough times? Your family? Friends? A health care provider?  What pleasures do you get from smoking? What ways can you still get pleasure if you quit? Here are some questions to ask your health care provider:  How can you help me to be successful at quitting?  What medicine do you think would be best for me and how should I take it?  What should I do if I need more help?  What is smoking withdrawal like? How can I get information on withdrawal? GET READY  Set a quit date.  Change your environment by getting rid of all cigarettes, ashtrays, matches, and lighters in your home, car, or work. Do not let people smoke in your home.  Review your past attempts to quit. Think about what worked and what did not. GET SUPPORT AND ENCOURAGEMENT You have a better chance of being successful if you have help. You can get support in many ways.  Tell your family, friends, and coworkers that you are going to quit and need their support. Ask them not to smoke around you.  Get individual, group, or telephone counseling and support. Programs are available at [employer] and health centers. Call  your local health department for information about programs in your area.  Spiritual beliefs and practices may help some smokers quit.  Download a "quit meter" on your computer to keep track of quit statistics, such as how long you have gone without smoking, cigarettes not smoked, and money saved.  Get a self-help book about quitting smoking and staying off tobacco. LEARN NEW SKILLS AND BEHAVIORS  Distract yourself from urges to smoke. Talk to someone, go for a walk, or occupy your time with a task.  Change your normal routine. Take a different route to work.  Drink tea instead of coffee. Eat breakfast in a different place.  Reduce your stress. Take a hot bath, exercise, or read a book.  Plan something enjoyable to do every day. Reward yourself for not smoking.  Explore interactive web-based programs that specialize in helping you quit. GET MEDICINE AND USE IT CORRECTLY Medicines can help you stop smoking and decrease the urge to smoke. Combining medicine with the above behavioral methods and support can greatly increase your chances of successfully quitting smoking.  Nicotine replacement therapy helps deliver nicotine to your body without the negative effects and risks of smoking. Nicotine replacement therapy includes nicotine gum, lozenges, inhalers, nasal sprays, and skin patches. Some may be available over-the-counter and others require a prescription.  Antidepressant medicine helps people abstain from smoking, but how this works is unknown. This medicine is available by prescription.  Nicotinic receptor partial agonist medicine simulates the effect of nicotine in your brain. This medicine is available by prescription. Ask your health care provider for advice about which medicines to use and how to use them based on your health history. Your health care provider will tell you what side effects to look out for if you choose to be on a medicine or therapy. Carefully read the information on the package. Do not use any other product containing nicotine while using a nicotine replacement product.  RELAPSE OR DIFFICULT SITUATIONS Most relapses occur within the first 3 months after quitting. Do not be discouraged if you start smoking again. Remember, most people try several times before finally quitting. You may have symptoms of withdrawal because your body is used to nicotine. You may crave cigarettes, be irritable, feel very hungry, cough often, get headaches, or have difficulty concentrating. The withdrawal symptoms are only temporary. They are strongest  when you first quit, but they will go away within 10-14 days. To reduce the chances of relapse, try to:  Avoid drinking alcohol. Drinking lowers your chances of successfully quitting.  Reduce the amount of caffeine you consume. Once you quit smoking, the amount of caffeine in your body increases and can give you symptoms, such as a rapid heartbeat, sweating, and anxiety.  Avoid smokers because they can make you want to smoke.  Do not let weight gain distract you. Many smokers will gain weight when they quit, usually less than 10 pounds. Eat a healthy diet and stay active. You can always lose the weight gained after you quit.  Find ways to improve your mood other than smoking. FOR MORE INFORMATION  www.smokefree.gov  Document Released: 09/11/2001 Document Revised: 02/01/2014 Document Reviewed: 12/27/2011 [facility] Patient Information 2015 [place], [place]. This information is not intended to replace advice given to you by your health care provider. Make sure you discuss any questions you have with your health care provider. Radial Site Care Refer to this sheet in the next few weeks. These instructions provide you with information on caring for yourself  after your procedure. Your caregiver may also give you more specific instructions. Your treatment has been planned according to current medical practices, but problems sometimes occur. Call your caregiver if you have any problems or questions after your procedure. HOME CARE INSTRUCTIONS  You may shower the day after the procedure.Remove the bandage (dressing) and gently wash the site with plain soap and water.Gently pat the site dry.  Do not apply powder or lotion to the site.  Do not submerge the affected site in water for 3 to 5 days.  Inspect the site at least twice daily.  Do not flex or bend the affected arm for 24 hours.  No lifting over 5 pounds (2.3 kg) for 5 days after your procedure.  Do not drive home if you are discharged the  same day of the procedure. Have someone else drive you.  You may drive 24 hours after the procedure unless otherwise instructed by your caregiver.  Do not operate machinery or power tools for 24 hours.  A responsible adult should be with you for the first 24 hours after you arrive home. What to expect:  Any bruising will usually fade within 1 to 2 weeks.  Blood that collects in the tissue (hematoma) may be painful to the touch. It should usually decrease in size and tenderness within 1 to 2 weeks. SEEK IMMEDIATE MEDICAL CARE IF:  You have unusual pain at the radial site.  You have redness, warmth, swelling, or pain at the radial site.  You have drainage (other than a small amount of blood on the dressing).  You have chills.  You have a fever or persistent symptoms for more than 72 hours.  You have a fever and your symptoms suddenly get worse.  Your arm becomes pale, cool, tingly, or numb.  You have heavy bleeding from the site. Hold pressure on the site. Document Released: 10/20/2010 Document Revised: 12/10/2011 Document Reviewed: 10/20/2010 [facility] Patient Information 2015 [place], [place]. This information is not intended to replace advice given to you by your health care provider. Make sure you discuss any questions you have with your health care provider.

## 2014-07-06 NOTE — Care Management Note (Signed)
    Page 1 of 1   07/06/2014     12:01:48 PM CARE MANAGEMENT NOTE 07/06/2014  Patient:  Alan White, Alan White   Account Number:  [id number]  Date Initiated:  07/06/2014  Documentation initiated by:  Pegge Cumberledge  Subjective/Objective Assessment:   Pt adm on 07/03/14 with chest pain.  PTA, pt independent of ADLS.     Action/Plan:   Cath yesterday negative for disease.  Pt for dc today; no dc needs identified.   Anticipated DC Date:  07/06/2014   Anticipated DC Plan:  HOME/SELF CARE      DC Planning Services  CM consult      Choice offered to / List presented to:             Status of service:  Completed, signed off Medicare Important Message given?   (If response is "NO", the following Medicare IM given date fields will be blank) Date Medicare IM given:   Medicare IM given by:   Date Additional Medicare IM given:   Additional Medicare IM given by:    Discharge Disposition:  HOME/SELF CARE  Per UR Regulation:  Reviewed for med. necessity/level of care/duration of stay  If discussed at Long Length of Stay Meetings, dates discussed:    Comments:

## 2014-07-06 NOTE — Progress Notes (Signed)
Pt discharged home with wife Discharge instructions given & reviewed Eduction discussed  IV dc'd  Tele dc'd  Pt discharged via wheelchair with volunteer services.   Alan White

## 2014-07-06 NOTE — Progress Notes (Signed)
Reviewed student nurses's assessment and agrees with findings. Louie Bun S 11:24 AM

## 2014-07-07 ENCOUNTER — Encounter (HOSPITAL_COMMUNITY): Payer: BC Managed Care – PPO

## 2014-07-09 ENCOUNTER — Encounter (HOSPITAL_COMMUNITY): Payer: BC Managed Care – PPO

## 2014-07-12 ENCOUNTER — Encounter (HOSPITAL_COMMUNITY): Payer: BC Managed Care – PPO

## 2014-07-14 ENCOUNTER — Encounter (HOSPITAL_COMMUNITY): Payer: BC Managed Care – PPO

## 2014-07-16 ENCOUNTER — Encounter (HOSPITAL_COMMUNITY): Payer: BC Managed Care – PPO

## 2014-07-19 ENCOUNTER — Encounter (HOSPITAL_COMMUNITY): Payer: BC Managed Care – PPO

## 2014-07-21 ENCOUNTER — Encounter (HOSPITAL_COMMUNITY): Payer: BC Managed Care – PPO

## 2014-07-22 ENCOUNTER — Other Ambulatory Visit: Payer: Self-pay | Admitting: Cardiology

## 2014-07-22 NOTE — Telephone Encounter (Signed)
Rx was sent to pharmacy electronically. 

## 2014-07-23 ENCOUNTER — Encounter (HOSPITAL_COMMUNITY): Payer: BC Managed Care – PPO

## 2014-07-30 ENCOUNTER — Ambulatory Visit (INDEPENDENT_AMBULATORY_CARE_PROVIDER_SITE_OTHER): Payer: BC Managed Care – PPO | Admitting: Internal Medicine

## 2014-07-30 ENCOUNTER — Encounter: Payer: Self-pay | Admitting: Internal Medicine

## 2014-07-30 VITALS — BP 138/80 | HR 73 | Ht 72.0 in | Wt 208.0 lb

## 2014-07-30 DIAGNOSIS — I2510 Atherosclerotic heart disease of native coronary artery without angina pectoris: Secondary | ICD-10-CM

## 2014-07-30 DIAGNOSIS — E785 Hyperlipidemia, unspecified: Secondary | ICD-10-CM

## 2014-07-30 DIAGNOSIS — I2583 Coronary atherosclerosis due to lipid rich plaque: Secondary | ICD-10-CM

## 2014-07-30 DIAGNOSIS — R079 Chest pain, unspecified: Secondary | ICD-10-CM

## 2014-07-30 DIAGNOSIS — Z720 Tobacco use: Secondary | ICD-10-CM

## 2014-07-30 DIAGNOSIS — Z79899 Other long term (current) drug therapy: Secondary | ICD-10-CM

## 2014-07-30 MED ORDER — CLOPIDOGREL BISULFATE 75 MG PO TABS: 75.0000 mg | ORAL_TABLET | Freq: Every day | ORAL | Status: DC

## 2014-07-30 MED ORDER — ISOSORBIDE MONONITRATE ER 30 MG PO TB24: 30.0000 mg | ORAL_TABLET | Freq: Every day | ORAL | Status: DC

## 2014-07-30 NOTE — Progress Notes (Signed)
OFFICE NOTE  Chief Complaint:  Chest pain  Primary Care Physician: Pcp Not In System  HPI:  Alan White is a 53 y.o. male with no history of CAD. CRFs are tobacco, FH. No known history of HTN, HL, DM. He presented 12/28/13 with a 4 day history of intermittent chest pressure. It was left substernal, radiating to both arms. It was 8/10 at its worst. It had occurred at rest and with exertion. He tried OTC PPI and Tums, without relief. One time, he took ASA which helped. It seems to happen more often in the afternoon/evening. It has also woken him from sleep. He had SOB with it it if occurred with exertion, but also describes DOE. He has had no N&V or diaphoresis. He told his wife about it 3 days ago, has had multiple episodes. He took a day off from work and came to the ER because it was scaring him. He was admitted by Dr. Rennis Golden with addition of IV heparin and NTG. Cardiac enzymes were negative- exceptfor 0.11 of troponin marker. Pt underwent cardiac cath with findings of:   1. Severe proximal to mid LAD and mid circumflex stenoses as outlined above.  2. Normal right coronary artery.  3. Wall motion abnormality involving the inferoapical wall. Preserved LVEF at 50-55%  4. Successful circumflex DES implantation post dilated to 3.75 mm in diameter and an aneurysmal portion of the vessel. 0% stenosis was noted post deployment.  5. Successful deployment of overlapping stents in the proximal to mid LAD from 99% to 0% with TIMI grade 3 flow. Postdilatation diameter was 3.0   Dual antiplatelet therapy for greater than 12 months. Brilinta can be switched to Plavix at 6 months.   Pt did well post procedure. EKG with incomplete RBBB, t wave inversions in ant lat leads. Was seen by Dr. [place] and found stable for discharge. Will ambulate with cardiac rehab first. Have called [facility] to arrange new pt appointment, they are to call pt.   Other issues borderline diabetes will have dietician see  before discharge. Pt on statin, BB and asa, Brilinta. BP is borderline, ACE will be added as outpatient. No work until  01/04/14.  Alan White returns today and is reporting very similar left chest discomfort that he had prior to his original stents. He reported a soreness under the left breast which improved with elevating the left breast. This is very atypical however seem to improve significantly after having his stents placed. He said that he felt great for about 3 months after having stents placed but is since become more short of breath and continues to have chest discomfort. He presented the hospital with similar complaints in early October and underwent a repeat cardiac catheterization which showed widely patent stents. He was discharged without changes in his medication. He comes back today with again similar complaints in the left chest. Unclear whether this is angina or not. He may have had some improvement with a nitrate. Again he is describing some shortness of breath.   PMHx:  Past Medical History  Diagnosis Date  . GERD (gastroesophageal reflux disease)   . CAD (coronary artery disease), 12/29/13 PCI/DES LCX and PCI/DES LAD with overlapping DES  12/30/2013    cath 07/05/14 [place]  . Dyslipidemia, goal LDL below 70 12/30/2013  . Metabolic syndrome, HgbA1C 6.0  12/30/2013  . Smoker   . Sleep apnea     by history    Past Surgical History  Procedure Laterality Date  .  Coronary angioplasty with stent placement  12/29/13    DES to LCX and overlapping stents DES mid LAD  . Cardiac catheterization  07/05/14    patent stents    FAMHx:  Family History  Problem Relation Age of Onset  . Heart attack Brother     Deceased  . Heart attack Brother   . Stroke Sister   . Diabetes Father     also heart disease  . Diabetes Mother   . Cancer Father     Deceased  . Cancer Mother     Deceased    SOCHx:   reports that he has been smoking Cigarettes.  He has a 30 pack-year smoking history. He has  never used smokeless tobacco. He reports that he does not drink alcohol or use illicit drugs.  ALLERGIES:  No Known Allergies  ROS: A comprehensive review of systems was negative except for: Respiratory: positive for dyspnea on exertion Cardiovascular: positive for chest pain  HOME MEDS: Current Outpatient Prescriptions  Medication Sig Dispense Refill  . acetaminophen (TYLENOL) 500 MG tablet Take 500 mg by mouth daily.      Marland Kitchen aspirin 81 MG chewable tablet Chew 1 tablet (81 mg total) by mouth daily.      Marland Kitchen atorvastatin (LIPITOR) 40 MG tablet Take 1 tablet (40 mg total) by mouth daily at 6 PM.  30 tablet  6  . carvedilol (COREG) 3.125 MG tablet TAKE 1 TABLET BY MOUTH TWICE A DAY WITH A MEAL  60 tablet  6  . esomeprazole (NEXIUM 24HR) 20 MG capsule Take 20 mg by mouth daily at 12 noon.      . nitroGLYCERIN (NITROSTAT) 0.4 MG SL tablet Place 1 tablet (0.4 mg total) under the tongue every 5 (five) minutes x 3 doses as needed for chest pain.  25 tablet  4  . clopidogrel (PLAVIX) 75 MG tablet Take 1 tablet (75 mg total) by mouth daily.  30 tablet  6  . isosorbide mononitrate (IMDUR) 30 MG 24 hr tablet Take 1 tablet (30 mg total) by mouth daily.  30 tablet  6   No current facility-administered medications for this visit.    LABS/IMAGING: No results found for this or any previous visit (from the past 48 hour(s)). No results found.  VITALS: BP 138/80  Pulse 73  Ht 6' (1.829 m)  Wt 208 lb (94.348 kg)  BMI 28.20 kg/m2  EXAM: General appearance: alert and no distress Neck: no carotid bruit and no JVD Lungs: clear to auscultation bilaterally Heart: regular rate and rhythm, S1, S2 normal, no murmur, click, rub or gallop Abdomen: soft, non-tender; bowel sounds normal; no masses,  no organomegaly Extremities: extremities normal, atraumatic, no cyanosis or edema Pulses: 2+ and symmetric Skin: Skin color, texture, turgor normal. No rashes or lesions Neurologic: Grossly normal Psych: Mood,  affect normal  EKG: Normal sinus rhythm at 73 with occasional PVC's and PAC's  ASSESSMENT: 1. Coronary artery disease status post two-vessel PCI to the left circumflex and LAD 2. Dyslipidemia on statin 3. Tobacco abuse 4. Mildly reduced LV function with EF 50-55% 5. Persistent left chest wall pain  PLAN: 1.   Alan White has recurrent left chest wall pain symptoms which are reminiscent of his angina however his recent repeat cardiac catheterization showed patent stents within the last month. This could represent small vessel disease, coronary spasm or be true true unrelated. I would recommend starting low-dose Imdur were 30 mg daily to see if we can improve some  of his anginal symptoms. In addition, I would switch him today [place] Plav[place]. Hopefully this may help with some of the shortness of breath. I would like to obtain a P2Y12 study in at least one week from now as well as a repeat lipid profile. Plan to see him back in 1 month to see if his symptoms have improved. He is self reporting significant anxiety. He does not have a primary care provider and I've encouraged him to get one as he may need treatment for this as well.  Chrystie NoseKenneth C. Hilty, MD, Atlantic General HospitalFACC Attending Cardiologist [facility]  HILTY,Kenneth C 07/30/2014, 8:53 AM

## 2014-07-30 NOTE — Patient Instructions (Signed)
Your physician has recommended you make the following change in your medication...  1. STOP Brilinta 2. START Plavix 75mg  once daily 3. START Isosorbide (Imdur) 30mg  once daily  Your physician recommends that you return for lab work in ONE WEEKS - fasting - at [facility] & ask for outpatient lab  Your physician recommends that you schedule a follow-up appointment in 1 month with Dr. Rennis Golden

## 2014-08-10 ENCOUNTER — Ambulatory Visit (HOSPITAL_COMMUNITY)
Admission: AD | Admit: 2014-08-10 | Discharge: 2014-08-10 | Disposition: A | Discharge status: Home / Self Care | Payer: BC Managed Care – PPO | Source: Ambulatory Visit | Attending: Internal Medicine | Admitting: Internal Medicine

## 2014-08-10 DIAGNOSIS — F17200 Nicotine dependence, unspecified, uncomplicated: Secondary | ICD-10-CM | POA: Diagnosis not present

## 2014-08-10 DIAGNOSIS — I2510 Atherosclerotic heart disease of native coronary artery without angina pectoris: Secondary | ICD-10-CM | POA: Insufficient documentation

## 2014-08-10 DIAGNOSIS — Z7901 Long term (current) use of anticoagulants: Secondary | ICD-10-CM | POA: Diagnosis present

## 2014-08-10 DIAGNOSIS — E785 Hyperlipidemia, unspecified: Secondary | ICD-10-CM | POA: Diagnosis present

## 2014-08-10 LAB — PLATELET INHIBITION P2Y12: PLATELET FUNCTION P2Y12: 221 [PRU] (ref 194–418)

## 2014-08-10 LAB — LIPID PANEL
Cholesterol: 119 mg/dL (ref 0–200)
HDL: 28 mg/dL — ABNORMAL LOW (ref >39)
LDL CALC: 64 mg/dL (ref 0–99)
Total CHOL/HDL Ratio: 4.3 RATIO
Triglycerides: 134 mg/dL (ref <150)
VLDL: 27 mg/dL (ref 0–40)

## 2014-08-17 ENCOUNTER — Other Ambulatory Visit: Payer: Self-pay | Admitting: Cardiology

## 2014-08-17 NOTE — Telephone Encounter (Signed)
Rx has been sent to the pharmacy electronically. ° °

## 2014-09-03 ENCOUNTER — Encounter: Payer: Self-pay | Admitting: Internal Medicine

## 2014-09-03 ENCOUNTER — Ambulatory Visit (INDEPENDENT_AMBULATORY_CARE_PROVIDER_SITE_OTHER): Payer: BC Managed Care – PPO | Admitting: Internal Medicine

## 2014-09-03 VITALS — BP 140/90 | HR 66 | Ht 72.0 in | Wt 212.9 lb

## 2014-09-03 DIAGNOSIS — E8881 Metabolic syndrome: Secondary | ICD-10-CM

## 2014-09-03 DIAGNOSIS — I519 Heart disease, unspecified: Secondary | ICD-10-CM

## 2014-09-03 DIAGNOSIS — I2583 Coronary atherosclerosis due to lipid rich plaque: Secondary | ICD-10-CM

## 2014-09-03 DIAGNOSIS — I2510 Atherosclerotic heart disease of native coronary artery without angina pectoris: Secondary | ICD-10-CM

## 2014-09-03 DIAGNOSIS — E785 Hyperlipidemia, unspecified: Secondary | ICD-10-CM

## 2014-09-03 NOTE — Progress Notes (Signed)
OFFICE NOTE  Chief Complaint:  "Feel the best I have in years"  Primary Care Physician: Pcp Not In System  HPI:  Friend Vanderpoel is a 53 y.o. male with no history of CAD. CRFs are tobacco, FH. No known history of HTN, HL, DM. He presented 12/28/13 with a 4 day history of intermittent chest pressure. It was left substernal, radiating to both arms. It was 8/10 at its worst. It had occurred at rest and with exertion. He tried OTC PPI and Tums, without relief. One time, he took ASA which helped. It seems to happen more often in the afternoon/evening. It has also woken him from sleep. He had SOB with it it if occurred with exertion, but also describes DOE. He has had no N&V or diaphoresis. He told his wife about it 3 days ago, has had multiple episodes. He took a day off from work and came to the ER because it was scaring him. He was admitted by Dr. Rennis Golden with addition of IV heparin and NTG. Cardiac enzymes were negative- exceptfor 0.11 of troponin marker. Pt underwent cardiac cath with findings of:   1. Severe proximal to mid LAD and mid circumflex stenoses as outlined above.  2. Normal right coronary artery.  3. Wall motion abnormality involving the inferoapical wall. Preserved LVEF at 50-55%  4. Successful circumflex DES implantation post dilated to 3.75 mm in diameter and an aneurysmal portion of the vessel. 0% stenosis was noted post deployment.  5. Successful deployment of overlapping stents in the proximal to mid LAD from 99% to 0% with TIMI grade 3 flow. Postdilatation diameter was 3.0   Dual antiplatelet therapy for greater than 12 months. Brilinta can be switched to Plavix at 6 months.   Pt did well post procedure. EKG with incomplete RBBB, t wave inversions in ant lat leads. Was seen by Dr. Swaziland and found stable for discharge. Will ambulate with cardiac rehab first. Have called [facility] to arrange new pt appointment, they are to call pt.   Other issues borderline diabetes  will have dietician see before discharge. Pt on statin, BB and asa, Brilinta. BP is borderline, ACE will be added as outpatient. No work until  01/04/14.  Mr. Fadler had been reporting very similar left chest discomfort that he had prior to his original stents. He reported a soreness under the left breast which improved with elevating the left breast. This is very atypical however seem to improve significantly after having his stents placed. He said that he felt great for about 3 months after having stents placed but is since become more short of breath and continues to have chest discomfort. He presented the hospital with similar complaints in early October and underwent a repeat cardiac catheterization which showed widely patent stents. He was discharged without changes in his medication. He comes back today with again similar complaints in the left chest. Unclear whether this is angina or not. He may have had some improvement with a nitrate. Again he is describing some shortness of breath.  Mr. Koss returns today and reports she is feeling the best he has in years. He took his isosorbide for about a week and then had awful headaches and discontinued it. He did notice a change during that week and improvement in his chest pain symptoms, however. He also was switched from Brilinta to Plavix and seems to be tolerating this well. His P2 Y 12 assay indicated 221,which is adequate platelet suppression.   PMHx:  Past Medical History  Diagnosis Date  . GERD (gastroesophageal reflux disease)   . CAD (coronary artery disease), 12/29/13 PCI/DES LCX and PCI/DES LAD with overlapping DES  12/30/2013    [place] 07/05/14 [place]  . Dyslipidemia, goal LDL below 70 12/30/2013  . Metabolic syndrome, HgbA1C 6.0  12/30/2013  . Smoker   . Sleep apnea     by history    Past Surgical History  Procedure Laterality Date  . Coronary angioplasty with stent placement  12/29/13    DES to LCX and overlapping stents DES mid LAD  .  Cardiac catheterization  07/05/14    patent stents    FAMHx:  Family History  Problem Relation Age of Onset  . Heart attack Brother     Deceased  . Heart attack Brother   . Stroke Sister   . Diabetes Father     also heart disease  . Diabetes Mother   . Cancer Father     Deceased  . Cancer Mother     Deceased    SOCHx:   reports that he has been smoking Cigarettes.  He has a 30 pack-year smoking history. He has never used smokeless tobacco. He reports that he does not drink alcohol or use illicit drugs.  ALLERGIES:  No Known Allergies  ROS: A comprehensive review of systems was negative.  HOME MEDS: Current Outpatient Prescriptions  Medication Sig Dispense Refill  . acetaminophen (TYLENOL) 500 MG tablet Take 500 mg by mouth daily.    Marland Kitchen. aspirin 81 MG chewable tablet Chew 1 tablet (81 mg total) by mouth daily.    Marland Kitchen. atorvastatin (LIPITOR) 40 MG tablet TAKE 1 TABLET BY MOUTH EVERY DAY AT 6PM 30 tablet 6  . carvedilol (COREG) 3.125 MG tablet TAKE 1 TABLET BY MOUTH TWICE A DAY WITH A MEAL 60 tablet 6  . clopidogrel (PLAVIX) 75 MG tablet Take 1 tablet (75 mg total) by mouth daily. 30 tablet 6  . esomeprazole (NEXIUM 24HR) 20 MG capsule Take 20 mg by mouth daily at 12 noon.    . nitroGLYCERIN (NITROSTAT) 0.4 MG SL tablet Place 1 tablet (0.4 mg total) under the tongue every 5 (five) minutes x 3 doses as needed for chest pain. 25 tablet 4   No current facility-administered medications for this visit.    LABS/IMAGING: No results found for this or any previous visit (from the past 48 hour(s)). No results found.  VITALS: BP 140/90 mmHg  Pulse 66  Ht 6' (1.829 m)  Wt 212 lb 14.4 oz (96.571 kg)  BMI 28.87 kg/m2  EXAM: deferred  EKG: deferred  ASSESSMENT: 1. Coronary artery disease status post two-vessel PCI to the left circumflex and LAD 2. Dyslipidemia on statin 3. Tobacco abuse 4. Mildly reduced LV function with EF 50-55%  PLAN: 1.   [name] reports his chest  pain and shortness of breath is resolved. He's been switched to Plavix which she needs to stay on for at least a total of one year. His repeat lipid profile was markedly improved. He says he feels the best he has in years. We'll continue his current medications and plan to see him back annually or sooner as necessary.  Chrystie NoseKenneth C. Hilty, MD, [facility] Attending Cardiologist [facility]  HILTY,Kenneth C 09/03/2014, 8:25 AM

## 2014-09-03 NOTE — Patient Instructions (Signed)
Return in 6 months

## 2014-09-09 ENCOUNTER — Encounter (HOSPITAL_COMMUNITY): Payer: Self-pay | Admitting: Interventional Cardiology

## 2015-02-04 ENCOUNTER — Encounter (HOSPITAL_COMMUNITY): Payer: Self-pay

## 2015-02-04 ENCOUNTER — Emergency Department (HOSPITAL_COMMUNITY)
Admission: EM | Admit: 2015-02-04 | Discharge: 2015-02-05 | Disposition: A | Discharge status: Home / Self Care | Payer: BLUE CROSS/BLUE SHIELD | Attending: Emergency Medicine | Admitting: Emergency Medicine

## 2015-02-04 ENCOUNTER — Emergency Department (HOSPITAL_COMMUNITY): Payer: BLUE CROSS/BLUE SHIELD

## 2015-02-04 DIAGNOSIS — K219 Gastro-esophageal reflux disease without esophagitis: Secondary | ICD-10-CM | POA: Diagnosis not present

## 2015-02-04 DIAGNOSIS — I2510 Atherosclerotic heart disease of native coronary artery without angina pectoris: Secondary | ICD-10-CM | POA: Insufficient documentation

## 2015-02-04 DIAGNOSIS — Z9889 Other specified postprocedural states: Secondary | ICD-10-CM | POA: Insufficient documentation

## 2015-02-04 DIAGNOSIS — E785 Hyperlipidemia, unspecified: Secondary | ICD-10-CM | POA: Diagnosis not present

## 2015-02-04 DIAGNOSIS — Z7982 Long term (current) use of aspirin: Secondary | ICD-10-CM | POA: Diagnosis not present

## 2015-02-04 DIAGNOSIS — Z8669 Personal history of other diseases of the nervous system and sense organs: Secondary | ICD-10-CM | POA: Diagnosis not present

## 2015-02-04 DIAGNOSIS — Z79899 Other long term (current) drug therapy: Secondary | ICD-10-CM | POA: Diagnosis not present

## 2015-02-04 DIAGNOSIS — R109 Unspecified abdominal pain: Secondary | ICD-10-CM | POA: Diagnosis present

## 2015-02-04 DIAGNOSIS — Z720 Tobacco use: Secondary | ICD-10-CM | POA: Insufficient documentation

## 2015-02-04 DIAGNOSIS — Z9861 Coronary angioplasty status: Secondary | ICD-10-CM | POA: Diagnosis not present

## 2015-02-04 DIAGNOSIS — N201 Calculus of ureter: Secondary | ICD-10-CM | POA: Diagnosis not present

## 2015-02-04 DIAGNOSIS — Z7902 Long term (current) use of antithrombotics/antiplatelets: Secondary | ICD-10-CM | POA: Diagnosis not present

## 2015-02-04 LAB — URINALYSIS, ROUTINE W REFLEX MICROSCOPIC
Bilirubin Urine: NEGATIVE
Glucose, UA: NEGATIVE mg/dL
KETONES UR: NEGATIVE mg/dL
NITRITE: NEGATIVE
PROTEIN: 30 mg/dL — AB
Specific Gravity, Urine: 1.023 (ref 1.005–1.030)
UROBILINOGEN UA: 0.2 mg/dL (ref 0.0–1.0)
pH: 6 (ref 5.0–8.0)

## 2015-02-04 LAB — I-STAT CHEM 8, ED
BUN: 17 mg/dL (ref 6–20)
CALCIUM ION: 1.19 mmol/L (ref 1.12–1.23)
CHLORIDE: 105 mmol/L (ref 101–111)
Creatinine, Ser: 1.3 mg/dL — ABNORMAL HIGH (ref 0.61–1.24)
Glucose, Bld: 116 mg/dL — ABNORMAL HIGH (ref 70–99)
HEMATOCRIT: 43 % (ref 39.0–52.0)
Hemoglobin: 14.6 g/dL (ref 13.0–17.0)
Potassium: 3.6 mmol/L (ref 3.5–5.1)
SODIUM: 141 mmol/L (ref 135–145)
TCO2: 21 mmol/L (ref 0–100)

## 2015-02-04 LAB — URINE MICROSCOPIC-ADD ON

## 2015-02-04 MED ORDER — OXYCODONE-ACETAMINOPHEN 5-325 MG PO TABS: 1.0000 | ORAL_TABLET | ORAL | Status: DC | PRN

## 2015-02-04 MED ORDER — MORPHINE SULFATE 2 MG/ML IJ SOLN
2.0000 mg | Freq: Once | INTRAMUSCULAR | Status: AC
  Administered 2015-02-04: 2 mg via INTRAVENOUS
  Filled 2015-02-04: qty 1

## 2015-02-04 MED ORDER — ONDANSETRON 8 MG PO TBDP: 8.0000 mg | ORAL_TABLET | Freq: Three times a day (TID) | ORAL | Status: DC | PRN

## 2015-02-04 MED ORDER — KETOROLAC TROMETHAMINE 30 MG/ML IJ SOLN
30.0000 mg | Freq: Once | INTRAMUSCULAR | Status: AC
  Administered 2015-02-04: 30 mg via INTRAVENOUS
  Filled 2015-02-04: qty 1

## 2015-02-04 MED ORDER — SODIUM CHLORIDE 0.9 % IV BOLUS (SEPSIS)
500.0000 mL | Freq: Once | INTRAVENOUS | Status: AC
  Administered 2015-02-04: 500 mL via INTRAVENOUS

## 2015-02-04 NOTE — ED Notes (Signed)
Bed: WA19 Expected date:  Expected time:  Means of arrival:  Comments: Triage 2 

## 2015-02-04 NOTE — ED Provider Notes (Signed)
CSN: [id number]     Arrival date & time 02/04/15  2052 History   First MD Initiated Contact with Patient 02/04/15 2118     Chief Complaint  Patient presents with  . Urinary Retention  . Abdominal Pain  . Flank Pain     (Consider location/radiation/quality/duration/timing/severity/associated sxs/prior Treatment) HPI   54 year old man with onset of left flank pain approximately 5 PM this evening. It is severe and radiating from the mid left back to the left flank. He has had difficulty voiding since that time. He has not noted any blood in his urine. He denies any prior history of kidney stones.  Past Medical History  Diagnosis Date  . GERD (gastroesophageal reflux disease)   . CAD (coronary artery disease), 12/29/13 PCI/DES LCX and PCI/DES LAD with overlapping DES  12/30/2013    [place]ath 07/05/14 [place]  . Dyslipidemia, goal LDL below 70 12/30/2013  . Metabolic syndrome, HgbA1C 6.0  12/30/2013  . Smoker   . Sleep apnea     by history   Past Surgical History  Procedure Laterality Date  . Coronary angioplasty with stent placement  12/29/13    DES to LCX and overlapping stents DES mid LAD  . Cardiac catheterization  07/05/14    patent stents  . Left heart catheterization with coronary angiogram N/A 12/29/2013    Procedure: LEFT HEART CATHETERIZATION WITH CORONARY ANGIOGRAM;  Surgeon: [name], MD;  Location: [facility] CATH LAB;  Service: Cardiovascular;  Laterality: N/A;  . Percutaneous coronary stent intervention (pci-s)  12/29/2013    Procedure: PERCUTANEOUS CORONARY STENT INTERVENTION (PCI-S);  Surgeon: [name], MD;  Location: [facility] CATH LAB;  Service: Cardiovascular;;  . Left heart catheterization with coronary angiogram N/A 07/05/2014    Procedure: LEFT HEART CATHETERIZATION WITH CORONARY ANGIOGRAM;  Surgeon: Peter M Swaziland, MD;  Location: [facility] CATH LAB;  Service: Cardiovascular;  Laterality: N/A;   Family History  Problem Relation Age of Onset  . Heart attack Brother     Deceased  .  Heart attack Brother   . Stroke Sister   . Diabetes Father     also heart disease  . Diabetes Mother   . Cancer Father     Deceased  . Cancer Mother     Deceased   History  Substance Use Topics  . Smoking status: Current Every Day Smoker -- 1.00 packs/day for 30 years    Types: Cigarettes  . Smokeless tobacco: Never Used  . Alcohol Use: No     Comment: "Once in a blue moon"    Review of Systems  All other systems reviewed and are negative.     Allergies  Review of patient's allergies indicates no known allergies.  Home Medications   Prior to Admission medications   Medication Sig Start Date End Date Taking? Authorizing Provider  aspirin 81 MG chewable tablet Chew 1 tablet (81 mg total) by mouth daily. 12/30/13  Yes Leone Brand, NP  atorvastatin (LIPITOR) 40 MG tablet TAKE 1 TABLET BY MOUTH EVERY DAY AT [facility] 08/17/14  Yes Chrystie Nose, MD  carvedilol (COREG) 3.125 MG tablet TAKE 1 TABLET BY MOUTH TWICE A DAY WITH A MEAL 07/22/14  Yes Chrystie Nose, MD  clopidogrel (PLAVIX) 75 MG tablet Take 1 tablet (75 mg total) by mouth daily. 07/30/14  Yes Chrystie Nose, MD  esomeprazole (NEXIUM 24HR) 20 MG capsule Take 20 mg by mouth daily at 12 noon.   Yes Historical Provider, MD  acetaminophen (  TYLENOL) 500 MG tablet Take 500 mg by mouth every 8 (eight) hours as needed for mild pain.     Historical Provider, MD  nitroGLYCERIN (NITROSTAT) 0.4 MG SL tablet Place 1 tablet (0.4 mg total) under the tongue every 5 (five) minutes x 3 doses as needed for chest pain. 12/30/13   Leone Brand, NP   BP 117/96 mmHg  Pulse 76  Temp(Src) 97.6 F (36.4 C) (Oral)  Resp 18  SpO2 99% Physical Exam  Constitutional: He is oriented to person, place, and time. He appears well-developed and well-nourished. He appears distressed.  HENT:  Head: Normocephalic and atraumatic.  Right Ear: External ear normal.  Left Ear: External ear normal.  Nose: Nose normal.  Mouth/Throat: Oropharynx is clear  and moist.  Eyes: Conjunctivae and EOM are normal. Pupils are equal, round, and reactive to light.  Neck: Normal range of motion. Neck supple.  Cardiovascular: Normal rate, regular rhythm, normal heart sounds and intact distal pulses.   Pulmonary/Chest: Effort normal and breath sounds normal. No respiratory distress. He has no wheezes. He exhibits no tenderness.  Abdominal: Soft. Bowel sounds are normal. He exhibits no distension and no mass. There is no tenderness. There is no guarding.  Musculoskeletal: Normal range of motion.  Neurological: He is alert and oriented to person, place, and time. He has normal reflexes. He exhibits normal muscle tone. Coordination normal.  Skin: Skin is warm and dry.  Psychiatric: He has a normal mood and affect. His behavior is normal. Judgment and thought content normal.  Nursing note and vitals reviewed.   ED Course  Procedures (including critical care time) Labs Review Labs Reviewed  URINALYSIS, ROUTINE W REFLEX MICROSCOPIC - Abnormal; Notable for the following:    Color, Urine AMBER (*)    APPearance CLOUDY (*)    Hgb urine dipstick LARGE (*)    Protein, ur 30 (*)    Leukocytes, UA SMALL (*)    All other components within normal limits  URINE MICROSCOPIC-ADD ON - Abnormal; Notable for the following:    Bacteria, UA FEW (*)    All other components within normal limits  I-STAT CHEM 8, ED - Abnormal; Notable for the following:    Creatinine, Ser 1.30 (*)    Glucose, Bld 116 (*)    All other components within normal limits    Imaging Review No results found.   EKG Interpretation None      MDM   Final diagnoses:  Left ureteral stone    Patient with left flank pain. Pain free here after pain medicine and Toradol. CT is pending. Presentation consistent with kidney stone. Hematuria present on urinalysis    Margarita Grizzle, MD 02/05/15 1610

## 2015-02-04 NOTE — ED Notes (Signed)
Pt  Provided warm blankets for comfort

## 2015-02-04 NOTE — ED Notes (Signed)
Pt complains of severe 10/10 left flank pain since 5pm today.  He has been unable to urinate except for "2 teaspoons" earlier in the day.  Denies blood in urine.  No prior history of kidney stones.  Smokes 1 pk/day of cigarettes, no alcohol use.  No fevers.  Pt reports diaphoresis since 7pm.

## 2015-02-04 NOTE — ED Notes (Signed)
Pt provided with urinyl, he states he can't go but will attempt

## 2015-02-04 NOTE — Discharge Instructions (Signed)

## 2015-02-04 NOTE — ED Notes (Addendum)
Pt presents with c/o abdominal pain and urinary retention. Pt reports he has urinated "2 teaspoons" since he woke up this morning. Pt denies any N/V/D. Pt reports the pain radiates to his left flank area as well.

## 2015-02-05 NOTE — ED Notes (Signed)
Error on fall assessment this is on incorrect pt,  Pt is ambulatory without difficulty

## 2015-02-22 ENCOUNTER — Other Ambulatory Visit: Payer: Self-pay | Admitting: Internal Medicine

## 2015-02-22 NOTE — Telephone Encounter (Signed)
Rx(s) sent to pharmacy electronically.  

## 2015-03-04 ENCOUNTER — Ambulatory Visit (INDEPENDENT_AMBULATORY_CARE_PROVIDER_SITE_OTHER): Payer: BLUE CROSS/BLUE SHIELD | Admitting: Internal Medicine

## 2015-03-04 ENCOUNTER — Encounter: Payer: Self-pay | Admitting: Internal Medicine

## 2015-03-04 VITALS — BP 130/80 | HR 54 | Ht 72.0 in | Wt 210.1 lb

## 2015-03-04 DIAGNOSIS — I2510 Atherosclerotic heart disease of native coronary artery without angina pectoris: Secondary | ICD-10-CM

## 2015-03-04 DIAGNOSIS — Z720 Tobacco use: Secondary | ICD-10-CM | POA: Diagnosis not present

## 2015-03-04 DIAGNOSIS — E8881 Metabolic syndrome: Secondary | ICD-10-CM

## 2015-03-04 DIAGNOSIS — F419 Anxiety disorder, unspecified: Secondary | ICD-10-CM | POA: Diagnosis not present

## 2015-03-04 DIAGNOSIS — I519 Heart disease, unspecified: Secondary | ICD-10-CM | POA: Diagnosis not present

## 2015-03-04 DIAGNOSIS — I2583 Coronary atherosclerosis due to lipid rich plaque: Secondary | ICD-10-CM

## 2015-03-04 MED ORDER — BUPROPION HCL ER (SMOKING DET) 150 MG PO TB12: ORAL_TABLET | ORAL | Status: DC

## 2015-03-04 NOTE — Patient Instructions (Signed)
Dr. Rennis Golden has prescribed ZYBAN - to aid in smoking cessation  You will take 1 tablet daily for 3 days and increase to 1 tablet twice daily for 2 months You should pick a date to stop smoking within this 2 month time frame   Your physician recommends that you schedule a follow-up appointment in: 3 months with Dr. Rennis Golden

## 2015-03-04 NOTE — Progress Notes (Signed)
Marland Kitchen    OFFICE NOTE  Chief Complaint:  No complaints  Primary Care Physician: Pcp Not In System  HPI:  Alan White is a 54 y.o. male with no history of CAD. CRFs are tobacco, FH. No known history of HTN, HL, DM. He presented 12/28/13 with a 4 day history of intermittent chest pressure. It was left substernal, radiating to both arms. It was 8/10 at its worst. It had occurred at rest and with exertion. He tried OTC PPI and Tums, without relief. One time, he took ASA which helped. It seems to happen more often in the afternoon/evening. It has also woken him from sleep. He had SOB with it it if occurred with exertion, but also describes DOE. He has had no N&V or diaphoresis. He told his wife about it 3 days ago, has had multiple episodes. He took a day off from work and came to the ER because it was scaring him. He was admitted by Dr. Rennis Golden with addition of IV heparin and NTG. Cardiac enzymes were negative- exceptfor 0.11 of troponin marker. Pt underwent cardiac cath with findings of:   1. Severe proximal to mid LAD and mid circumflex stenoses as outlined above.  2. Normal right coronary artery.  3. Wall motion abnormality involving the inferoapical wall. Preserved LVEF at 50-55%  4. Successful circumflex DES implantation post dilated to 3.75 mm in diameter and an aneurysmal portion of the vessel. 0% stenosis was noted post deployment.  5. Successful deployment of overlapping stents in the proximal to mid LAD from 99% to 0% with TIMI grade 3 flow. Postdilatation diameter was 3.0   Dual antiplatelet therapy for greater than 12 months. Brilinta can be switched to Plavix at 6 months.   Pt did well post procedure. EKG with incomplete RBBB, t wave inversions in ant lat leads. Was seen by Dr. Swaziland and found stable for discharge. Will ambulate with cardiac rehab first. Have called [facility] to arrange new pt appointment, they are to call pt.   Other issues borderline diabetes will have  dietician see before discharge. Pt on statin, BB and asa, Brilinta. BP is borderline, ACE will be added as outpatient. No work until  01/04/14.  Alan White had been reporting very similar left chest discomfort that he had prior to his original stents. He reported a soreness under the left breast which improved with elevating the left breast. This is very atypical however seem to improve significantly after having his stents placed. He said that he felt great for about 3 months after having stents placed but is since become more short of breath and continues to have chest discomfort. He presented the hospital with similar complaints in early October and underwent a repeat cardiac catheterization which showed widely patent stents. He was discharged without changes in his medication. He comes back today with again similar complaints in the left chest. Unclear whether this is angina or not. He may have had some improvement with a nitrate. Again he is describing some shortness of breath.  Alan White returns today and reports she is feeling the best he has in years. He took his isosorbide for about a week and then had awful headaches and discontinued it. He did notice a change during that week and improvement in his chest pain symptoms, however. He also was switched from Brilinta to Plavix and seems to be tolerating this well. His P2 Y 12 assay indicated 221,which is adequate platelet suppression.  I saw Alan White back in  the office today. Overall he tends to feel fatigued at times. He said initially it felt the best that he had in years after his PCI but now seems to tired fairly easily. He was ordered for sleep study however that has not yet been performed. He also has some significant anxiety and continues to smoke. He's been very difficult for him to quit smoking. I do believe anxiety is playing a role in this. He denies anymore cardiac chest pain.  PMHx:  Past Medical History  Diagnosis Date  . GERD  (gastroesophageal reflux disease)   . CAD (coronary artery disease), 12/29/13 PCI/DES LCX and PCI/DES LAD with overlapping DES  12/30/2013    [place]ath 07/05/14 [place]  . Dyslipidemia, goal LDL below 70 12/30/2013  . Metabolic syndrome, HgbA1C 6.0  12/30/2013  . Smoker   . Sleep apnea     by history    Past Surgical History  Procedure Laterality Date  . Coronary angioplasty with stent placement  12/29/13    DES to LCX and overlapping stents DES mid LAD  . Cardiac catheterization  07/05/14    patent stents  . Left heart catheterization with coronary angiogram N/A 12/29/2013    Procedure: LEFT HEART CATHETERIZATION WITH CORONARY ANGIOGRAM;  Surgeon: [name], MD;  Location: [facility] CATH LAB;  Service: Cardiovascular;  Laterality: N/A;  . Percutaneous coronary stent intervention (pci-s)  12/29/2013    Procedure: PERCUTANEOUS CORONARY STENT INTERVENTION (PCI-S);  Surgeon: [name], MD;  Location: [facility] CATH LAB;  Service: Cardiovascular;;  . Left heart catheterization with coronary angiogram N/A 07/05/2014    Procedure: LEFT HEART CATHETERIZATION WITH CORONARY ANGIOGRAM;  Surgeon: Peter M Swaziland, MD;  Location: [facility] CATH LAB;  Service: Cardiovascular;  Laterality: N/A;    FAMHx:  Family History  Problem Relation Age of Onset  . Heart attack Brother     Deceased  . Heart attack Brother   . Stroke Sister   . Diabetes Father     also heart disease  . Diabetes Mother   . Cancer Father     Deceased  . Cancer Mother     Deceased    SOCHx:   reports that he has been smoking Cigarettes.  He has a 30 pack-year smoking history. He has never used smokeless tobacco. He reports that he does not drink alcohol or use illicit drugs.  ALLERGIES:  No Known Allergies  ROS: A comprehensive review of systems was negative except for: Constitutional: positive for fatigue Behavioral/Psych: positive for anxiety  HOME MEDS: Current Outpatient Prescriptions  Medication Sig Dispense Refill  . acetaminophen  (TYLENOL) 500 MG tablet Take 500 mg by mouth every 8 (eight) hours as needed for mild pain.     Marland Kitchen aspirin 81 MG chewable tablet Chew 1 tablet (81 mg total) by mouth daily.    Marland Kitchen atorvastatin (LIPITOR) 40 MG tablet TAKE 1 TABLET BY MOUTH EVERY DAY AT 6PM 30 tablet 6  . carvedilol (COREG) 3.125 MG tablet TAKE 1 TABLET BY MOUTH TWICE A DAY WITH A MEAL 60 tablet 6  . clopidogrel (PLAVIX) 75 MG tablet Take 1 tablet (75 mg total) by mouth daily. 30 tablet 7  . esomeprazole (NEXIUM 24HR) 20 MG capsule Take 20 mg by mouth daily at 12 noon.    . nitroGLYCERIN (NITROSTAT) 0.4 MG SL tablet Place 1 tablet (0.4 mg total) under the tongue every 5 (five) minutes x 3 doses as needed for chest pain. 25 tablet 4  . buPROPion (  ZYBAN) 150 MG 12 hr tablet Take 1 tablet by mouth daily for 3 days. Increase to 1 tablet twice daily for 2 months. 63 tablet 1   No current facility-administered medications for this visit.    LABS/IMAGING: No results found for this or any previous visit (from the past 48 hour(s)). No results found.  VITALS: BP 130/80 mmHg  Pulse 54  Ht 6' (1.829 m)  Wt 210 lb 1.6 oz (95.301 kg)  BMI 28.49 kg/m2  EXAM: General appearance: alert and no distress Neck: no carotid bruit and no JVD Lungs: clear to auscultation bilaterally Heart: regular rate and rhythm, S1, S2 normal, no murmur, click, rub or gallop Abdomen: soft, non-tender; bowel sounds normal; no masses,  no organomegaly Extremities: extremities normal, atraumatic, no cyanosis or edema Pulses: 2+ and symmetric Skin: Skin color, texture, turgor normal. No rashes or lesions Neurologic: Grossly normal Psych: Pleasant  EKG: Sinus bradycardia with first-degree AV block at 54  ASSESSMENT: 1. Coronary artery disease status post two-vessel PCI to the left circumflex and LAD 2. Dyslipidemia on statin 3. Tobacco abuse 4. Anxiety 5. Mildly reduced LV function with EF 50-55%  PLAN: 1.   Alan White has had no further chest pain. He  does have some mild anxiety. He is asking about possibly coming off of one of his blood thinners. I think he can stop the aspirin and stay on Plavix. This is what he would prefer. In addition, he does continue to smoke and says it's been very difficult for him to stop smoking. He's tried nicotine patches which have failed. I think he be a good candidate for treatment of his anxiety and smoking cessation with an agent like Wellbutrin. We'll go ahead and start him on that 150 mg of sustained release daily for 3 days and increase it to 150 mg twice daily. He should stay on this dose for 4-8 weeks and set a quit date to stop smoking during that time. Plan to see him back in a few months to see if he is improved. Hopefully can get off of smoking altogether.  Chrystie Nose, MD, [facility] Attending Cardiologist [facility]  Lisette Abu Fleet [place]am 03/04/2015, 2:01 PM

## 2015-03-07 ENCOUNTER — Telehealth: Payer: Self-pay | Admitting: *Deleted

## 2015-03-07 NOTE — Telephone Encounter (Signed)
LM for patient to call back  Need to know if patient was able to pick up Rx for bupropion 150mg  (zyban) - for smoking cessation   Pharmacy sent notification that med needed PA but also provided number for "quit for life" with message that patient can be Rx for free if for smoking cessation (phone number: [phone])

## 2015-03-10 NOTE — Telephone Encounter (Signed)
Contacted [facility] per PA notification @ [id number]. Inquired about PA  Was notified that if medication is for smoking cessation, patient can obtain for free thru [facility]uit [facility] Life, as denoted on PA notification.   LM for patient to call back so this information can be communicated

## 2015-03-16 ENCOUNTER — Encounter: Payer: Self-pay | Admitting: *Deleted

## 2015-03-16 NOTE — Telephone Encounter (Signed)
Letter with information regarding [facility]uit For Life mailed to patient.

## 2015-03-31 ENCOUNTER — Other Ambulatory Visit: Payer: Self-pay | Admitting: Internal Medicine

## 2015-03-31 NOTE — Telephone Encounter (Signed)
REFILL 

## 2015-06-24 ENCOUNTER — Encounter: Payer: Self-pay | Admitting: Internal Medicine

## 2015-06-24 ENCOUNTER — Ambulatory Visit (INDEPENDENT_AMBULATORY_CARE_PROVIDER_SITE_OTHER): Payer: BLUE CROSS/BLUE SHIELD | Admitting: Internal Medicine

## 2015-06-24 VITALS — BP 130/70 | HR 64 | Ht 72.0 in | Wt 207.3 lb

## 2015-06-24 DIAGNOSIS — I2583 Coronary atherosclerosis due to lipid rich plaque: Secondary | ICD-10-CM

## 2015-06-24 DIAGNOSIS — R079 Chest pain, unspecified: Secondary | ICD-10-CM

## 2015-06-24 DIAGNOSIS — Z720 Tobacco use: Secondary | ICD-10-CM | POA: Diagnosis not present

## 2015-06-24 DIAGNOSIS — I2510 Atherosclerotic heart disease of native coronary artery without angina pectoris: Secondary | ICD-10-CM | POA: Diagnosis not present

## 2015-06-24 NOTE — Patient Instructions (Signed)
Return in 6 months

## 2015-06-24 NOTE — Progress Notes (Signed)
Marland Kitchen    OFFICE NOTE  Chief Complaint:  No complaints  Primary Care Physician: Pcp Not In System  HPI:  Alan White is a 54 y.o. male with no history of CAD. CRFs are tobacco, FH. No known history of HTN, HL, DM. He presented 12/28/13 with a 4 day history of intermittent chest pressure. It was left substernal, radiating to both arms. It was 8/10 at its worst. It had occurred at rest and with exertion. He tried OTC PPI and Tums, without relief. One time, he took ASA which helped. It seems to happen more often in the afternoon/evening. It has also woken him from sleep. He had SOB with it it if occurred with exertion, but also describes DOE. He has had no N&V or diaphoresis. He told his wife about it 3 days ago, has had multiple episodes. He took a day off from work and came to the ER because it was scaring him. He was admitted by Dr. Rennis Golden with addition of IV heparin and NTG. Cardiac enzymes were negative- exceptfor 0.11 of troponin marker. Pt underwent cardiac cath with findings of:   1. Severe proximal to mid LAD and mid circumflex stenoses as outlined above.  2. Normal right coronary artery.  3. Wall motion abnormality involving the inferoapical wall. Preserved LVEF at 50-55%  4. Successful circumflex DES implantation post dilated to 3.75 mm in diameter and an aneurysmal portion of the vessel. 0% stenosis was noted post deployment.  5. Successful deployment of overlapping stents in the proximal to mid LAD from 99% to 0% with TIMI grade 3 flow. Postdilatation diameter was 3.0   Dual antiplatelet therapy for greater than 12 months. Brilinta can be switched to Plavix at 6 months.   Pt did well post procedure. EKG with incomplete RBBB, t wave inversions in ant lat leads. Was seen by Dr. Swaziland and found stable for discharge. Will ambulate with cardiac rehab first. Have called [facility] to arrange new pt appointment, they are to call pt.   Other issues borderline diabetes will have  dietician see before discharge. Pt on statin, BB and asa, Brilinta. BP is borderline, ACE will be added as outpatient. No work until  01/04/14.  Alan White had been reporting very similar left chest discomfort that he had prior to his original stents. He reported a soreness under the left breast which improved with elevating the left breast. This is very atypical however seem to improve significantly after having his stents placed. He said that he felt great for about 3 months after having stents placed but is since become more short of breath and continues to have chest discomfort. He presented the hospital with similar complaints in early October and underwent a repeat cardiac catheterization which showed widely patent stents. He was discharged without changes in his medication. He comes back today with again similar complaints in the left chest. Unclear whether this is angina or not. He may have had some improvement with a nitrate. Again he is describing some shortness of breath.  Alan White returns today and reports she is feeling the best he has in years. He took his isosorbide for about a week and then had awful headaches and discontinued it. He did notice a change during that week and improvement in his chest pain symptoms, however. He also was switched from Brilinta to Plavix and seems to be tolerating this well. His P2 Y 12 assay indicated 221,which is adequate platelet suppression.  I saw Alan White back in  the office today. Overall he tends to feel fatigued at times. He said initially it felt the best that he had in years after his PCI but now seems to tired fairly easily. He was ordered for sleep study however that has not yet been performed. He also has some significant anxiety and continues to smoke. He's been very difficult for him to quit smoking. I do believe anxiety is playing a role in this. He denies anymore cardiac chest pain.  Alan White returns today for follow-up. Overall he is feeling  excellent today. He denies any chest pain or shortness of breath. He's been dealing with a kidney stone but he seeing a urologist for this. He has significantly cut back his smoking down to 5 cigarettes a day. He is not started Zyban, but he has filled the prescription and said that he would start the medicine if he cannot stop smoking on his own.  PMHx:  Past Medical History  Diagnosis Date  . GERD (gastroesophageal reflux disease)   . CAD (coronary artery disease), 12/29/13 PCI/DES LCX and PCI/DES LAD with overlapping DES  12/30/2013    cath 07/05/14 [place]  . Dyslipidemia, goal LDL below 70 12/30/2013  . Metabolic syndrome, HgbA1C 6.0  12/30/2013  . Smoker   . Sleep apnea     by history    Past Surgical History  Procedure Laterality Date  . Coronary angioplasty with stent placement  12/29/13    DES to LCX and overlapping stents DES mid LAD  . Cardiac catheterization  07/05/14    patent stents  . Left heart catheterization with coronary angiogram N/A 12/29/2013    Procedure: LEFT HEART CATHETERIZATION WITH CORONARY ANGIOGRAM;  Surgeon: [name], MD;  Location: [facility] CATH LAB;  Service: Cardiovascular;  Laterality: N/A;  . Percutaneous coronary stent intervention (pci-s)  12/29/2013    Procedure: PERCUTANEOUS CORONARY STENT INTERVENTION (PCI-S);  Surgeon: [name], MD;  Location: [facility] CATH LAB;  Service: Cardiovascular;;  . Left heart catheterization with coronary angiogram N/A 07/05/2014    Procedure: LEFT HEART CATHETERIZATION WITH CORONARY ANGIOGRAM;  Surgeon: Peter M Swaziland, MD;  Location: [facility] CATH LAB;  Service: Cardiovascular;  Laterality: N/A;    FAMHx:  Family History  Problem Relation Age of Onset  . Heart attack Brother     Deceased  . Heart attack Brother   . Stroke Sister   . Diabetes Father     also heart disease  . Diabetes Mother   . Cancer Father     Deceased  . Cancer Mother     Deceased    SOCHx:   reports that he has been smoking Cigarettes.  He has a 30  pack-year smoking history. He has never used smokeless tobacco. He reports that he does not drink alcohol or use illicit drugs.  ALLERGIES:  No Known Allergies  ROS: A comprehensive review of systems was negative.  HOME MEDS: Current Outpatient Prescriptions  Medication Sig Dispense Refill  . acetaminophen (TYLENOL) 500 MG tablet Take 500 mg by mouth every 8 (eight) hours as needed for mild pain.     Marland Kitchen aspirin 81 MG chewable tablet Chew 1 tablet (81 mg total) by mouth daily.    Marland Kitchen atorvastatin (LIPITOR) 40 MG tablet TAKE 1 TABLET BY MOUTH EVERY DAY AT 6PM 30 tablet 2  . carvedilol (COREG) 3.125 MG tablet TAKE 1 TABLET BY MOUTH TWICE A DAY WITH A MEAL 60 tablet 2  . clopidogrel (PLAVIX) 75 MG tablet  Take 1 tablet (75 mg total) by mouth daily. 30 tablet 7  . esomeprazole (NEXIUM 24HR) 20 MG capsule Take 20 mg by mouth daily at 12 noon.    . nitroGLYCERIN (NITROSTAT) 0.4 MG SL tablet Place 1 tablet (0.4 mg total) under the tongue every 5 (five) minutes x 3 doses as needed for chest pain. 25 tablet 4  . oxyCODONE-acetaminophen (PERCOCET/ROXICET) 5-325 MG per tablet Take 1 tablet by mouth 2 (two) times daily as needed for moderate pain or severe pain.    Marland Kitchen buPROPion (ZYBAN) 150 MG 12 hr tablet Take 1 tablet by mouth daily for 3 days. Increase to 1 tablet twice daily for 2 months. (Patient not taking: Reported on 06/24/2015) 63 tablet 1   No current facility-administered medications for this visit.    LABS/IMAGING: No results found for this or any previous visit (from the past 48 hour(s)). No results found.  VITALS: BP 130/70 mmHg  Pulse 64  Ht 6' (1.829 m)  Wt 207 lb 4.8 oz (94.031 kg)  BMI 28.11 kg/m2  EXAM: General appearance: alert and no distress Lungs: clear to auscultation bilaterally Heart: regular rate and rhythm, S1, S2 normal, no murmur, click, rub or gallop Skin: Skin color, texture, turgor normal. No rashes or lesions Neurologic: Grossly normal  EKG: Normal sinus  rhythm at 64, nonspecific T wave changes  ASSESSMENT: 1. Coronary artery disease status post two-vessel PCI to the left circumflex and LAD 2. Dyslipidemia on statin 3. Tobacco abuse - working on quitting 4. Anxiety 5. Mildly reduced LV function with EF 50-55%  PLAN: 1.   Mr. Estepa feels excellent today. His symptoms have generally resolved. He has cut down his cigarettes down to 5 a day. He is intent on quitting without the help of bupropion. I've encouraged him to continue to work on this and he is set a goal to be completely abstinent from cigarettes by Christmas. I'll plan to see him back after the New Year's.  Chrystie Nose, MD, [facility] Attending Cardiologist [facility]  Chrystie Nose 06/24/2015, 8:19 AM

## 2015-07-21 ENCOUNTER — Other Ambulatory Visit: Payer: Self-pay | Admitting: Internal Medicine

## 2016-02-01 ENCOUNTER — Other Ambulatory Visit: Payer: Self-pay | Admitting: Internal Medicine

## 2016-02-01 NOTE — Telephone Encounter (Signed)
REFILL 

## 2016-02-02 ENCOUNTER — Other Ambulatory Visit: Payer: Self-pay | Admitting: Internal Medicine

## 2016-02-02 NOTE — Telephone Encounter (Signed)
Rx(s) sent to pharmacy electronically.  

## 2016-03-08 ENCOUNTER — Other Ambulatory Visit: Payer: Self-pay | Admitting: Internal Medicine

## 2016-03-08 NOTE — Telephone Encounter (Signed)
Rx(s) sent to pharmacy electronically.  

## 2016-04-18 ENCOUNTER — Other Ambulatory Visit: Payer: Self-pay

## 2016-04-18 NOTE — Telephone Encounter (Signed)
Refused refill on CLOPIDOGREL. Pt needs OV.

## 2016-04-19 ENCOUNTER — Other Ambulatory Visit: Payer: Self-pay | Admitting: *Deleted

## 2016-08-08 ENCOUNTER — Ambulatory Visit (INDEPENDENT_AMBULATORY_CARE_PROVIDER_SITE_OTHER): Payer: BLUE CROSS/BLUE SHIELD | Admitting: Physician Assistant

## 2016-08-08 ENCOUNTER — Encounter: Payer: Self-pay | Admitting: Physician Assistant

## 2016-08-08 VITALS — BP 140/86 | HR 65 | Ht 72.0 in | Wt 209.6 lb

## 2016-08-08 DIAGNOSIS — R0600 Dyspnea, unspecified: Secondary | ICD-10-CM

## 2016-08-08 DIAGNOSIS — I2510 Atherosclerotic heart disease of native coronary artery without angina pectoris: Secondary | ICD-10-CM | POA: Diagnosis not present

## 2016-08-08 DIAGNOSIS — E785 Hyperlipidemia, unspecified: Secondary | ICD-10-CM

## 2016-08-08 DIAGNOSIS — R7303 Prediabetes: Secondary | ICD-10-CM | POA: Diagnosis not present

## 2016-08-08 DIAGNOSIS — R5383 Other fatigue: Secondary | ICD-10-CM

## 2016-08-08 MED ORDER — CARVEDILOL 3.125 MG PO TABS: 3.1250 mg | ORAL_TABLET | Freq: Two times a day (BID) | ORAL | 6 refills | Status: DC

## 2016-08-08 MED ORDER — CLOPIDOGREL BISULFATE 75 MG PO TABS: 75.0000 mg | ORAL_TABLET | Freq: Every day | ORAL | 6 refills | Status: DC

## 2016-08-08 MED ORDER — ATORVASTATIN CALCIUM 40 MG PO TABS: 40.0000 mg | ORAL_TABLET | Freq: Every day | ORAL | 6 refills | Status: DC

## 2016-08-08 NOTE — Patient Instructions (Addendum)
Medication Instructions:  Your physician recommends that you continue on your current medications as directed. Please refer to the Current Medication list given to you today.  Labwork: COMPLETE THESE FASTING LABS AT DR MEYERS OFFICE: TSH, LIPID, CBC, A1C, CMP-FAX OVER A COPY OF RESULTS TO OUR OFFICE  Testing/Procedures: NONE   Follow-Up: Your physician recommends that you schedule a follow-up appointment in: 1 MONTH WITH DR HILTY OR HAO MENG, PA ON A DAY DR HILTY IS IN THE OFFICE  Any Other Special Instructions Will Be Listed Below (If Applicable).  RX's sent over to pharmacy   If you need a refill on your cardiac medications before your next appointment, please call your pharmacy.

## 2016-08-08 NOTE — Progress Notes (Signed)
Cardiology Office Note    Date:  08/08/2016   ID:  [name], DOB 10/25/1960, MRN [id number]  PCP:  Alan Rua, MD  Cardiologist:  Dr. Rennis Golden  Chief Complaint  Patient presents with  . Annual Exam    seen for Dr. Rennis Golden, annual followup    History of Present Illness:  Alan White is a 55 y.o. male with PMH of prediabetes, likely OSA, tobacco abuse, HLD, and CAD. Last hemoglobin A1c 6.0 on 12/28/2013. He was admitted for NSTEMI and underwent cath on 12/29/2013 which showed EF 50-55%, normal RCA, 85-90% mid LCx treated 3.5 x 12 mm Promus premiere DES postdilated to 3.75 mm, 90% proximal to mid LAD stenosis treated with a 3.0 x 16 mm Promus primary DES overlapped with a 2.75 x 16 mm Promus DES. Echocardiogram obtained 12/29/2048 showed EF 45-50%, mid to distal anteroapical and inferoapical hypokinesis, mild LVH. He had a recurrent chest discomfort and underwent relook cath in October 2015 that showed nonobstructive CAD, EF 55-65%.   According to the patient, he has not seen his PCP for over a year now. He is dizzy Dr. Joycelyn White in [facility]. Six month ago, he ran his medication and has not taken them since. He has chronic fatigue, he says he does wake up multiple times. Despite this, he did not have his sleep study done that was ordered in 2015. He says his wife mentions he snores only a little bit. In the last year, he has been noticing episodic shortness of breath. Sometimes it occurs with exertion, other times he can mow the yard for 45 minutes without any issue. He did not have the same heartburn sensation he had prior to the previous stent placement. He denies any significant lower extremity edema.   Past Medical History:  Diagnosis Date  . CAD (coronary artery disease), 12/29/13 PCI/DES LCX and PCI/DES LAD with overlapping DES  12/30/2013   cath 07/05/14 [place]  . Dyslipidemia, goal LDL below 70 12/30/2013  . GERD (gastroesophageal reflux disease)   .  Metabolic syndrome, HgbA1C 6.0  12/30/2013  . Sleep apnea    by history  . Smoker     Past Surgical History:  Procedure Laterality Date  . CARDIAC CATHETERIZATION  07/05/14   patent stents  . CORONARY ANGIOPLASTY WITH STENT PLACEMENT  12/29/13   DES to LCX and overlapping stents DES mid LAD  . LEFT HEART CATHETERIZATION WITH CORONARY ANGIOGRAM N/A 12/29/2013   Procedure: LEFT HEART CATHETERIZATION WITH CORONARY ANGIOGRAM;  Surgeon: [name], MD;  Location: [facility] CATH LAB;  Service: Cardiovascular;  Laterality: N/A;  . LEFT HEART CATHETERIZATION WITH CORONARY ANGIOGRAM N/A 07/05/2014   Procedure: LEFT HEART CATHETERIZATION WITH CORONARY ANGIOGRAM;  Surgeon: Peter M Swaziland, MD;  Location: [facility] CATH LAB;  Service: Cardiovascular;  Laterality: N/A;  . PERCUTANEOUS CORONARY STENT INTERVENTION (PCI-S)  12/29/2013   Procedure: PERCUTANEOUS CORONARY STENT INTERVENTION (PCI-S);  Surgeon: [name], MD;  Location: [facility] CATH LAB;  Service: Cardiovascular;;    Current Medications: Outpatient Medications Prior to Visit  Medication Sig Dispense Refill  . esomeprazole (NEXIUM 24HR) 20 MG capsule Take 20 mg by mouth daily at 12 noon.    . nitroGLYCERIN (NITROSTAT) 0.4 MG SL tablet Place 1 tablet (0.4 mg total) under the tongue every 5 (five) minutes x 3 doses as needed for chest pain. 25 tablet 4  . acetaminophen (TYLENOL) 500 MG tablet Take 500 mg by mouth every 8 (eight) hours as  needed for mild pain.     Marland Kitchen aspirin 81 MG chewable tablet Chew 1 tablet (81 mg total) by mouth daily.    Marland Kitchen atorvastatin (LIPITOR) 40 MG tablet TAKE 1 TABLET BY MOUTH EVERY DAY AT 6PM 30 tablet 10  . buPROPion (ZYBAN) 150 MG 12 hr tablet Take 1 tablet by mouth daily for 3 days. Increase to 1 tablet twice daily for 2 months. (Patient not taking: Reported on 06/24/2015) 63 tablet 1  . carvedilol (COREG) 3.125 MG tablet TAKE 1 TABLET BY MOUTH TWICE A DAY WITH A MEAL 60 tablet 10  . clopidogrel (PLAVIX) 75 MG tablet Take 1  tablet (75 mg total) by mouth daily. NEED OV. 30 tablet O  . clopidogrel (PLAVIX) 75 MG tablet TAKE 1 TABLET (75 MG TOTAL) BY MOUTH DAILY. <PLEASE MAKE APPOINTMENT FOR REFILLS> 15 tablet 0  . oxyCODONE-acetaminophen (PERCOCET/ROXICET) 5-325 MG per tablet Take 1 tablet by mouth 2 (two) times daily as needed for moderate pain or severe pain.     No facility-administered medications prior to visit.      Allergies:   Patient has no known allergies.   Social History   Social History  . Marital status: Married    Spouse name:    . Number of children: N/A  . Years of education: N/A   Occupational History  . Manager [employer]   Social History Main Topics  . Smoking status: Current Every Day Smoker    Packs/day: 1.00    Years: 30.00    Types: Cigarettes  . Smokeless tobacco: Never Used  . Alcohol use No     Comment: "Once in a blue moon"  . Drug use: No  . Sexual activity: Not Asked   Other Topics Concern  . None   Social History Narrative   Pt lives with wife.     Family History:  The patient's family history includes Cancer in his father and mother; Diabetes in his father and mother; Heart attack in his brother and brother; Stroke in his sister.   ROS:   Please see the history of present illness.    ROS All other systems reviewed and are negative.   PHYSICAL EXAM:   VS:  BP 140/86   Pulse 65   Ht 6' (1.829 m)   Wt 209 lb 9.6 oz (95.1 kg)   BMI 28.43 kg/m    GEN: Well nourished, well developed, in no acute distress  HEENT: normal  Neck: no JVD, carotid bruits, or masses Cardiac: RRR; no murmurs, rubs, or gallops,no edema  Respiratory:  clear to auscultation bilaterally, normal work of breathing GI: soft, nontender, nondistended, + BS MS: no deformity or atrophy  Skin: warm and dry, no rash Neuro:  Alert and Oriented x 3, Strength and sensation are intact Psych: euthymic mood, full affect  Wt Readings from Last 3 Encounters:  08/08/16 209 lb 9.6 oz  (95.1 kg)  06/24/15 207 lb 4.8 oz (94 kg)  03/04/15 210 lb 1.6 oz (95.3 kg)      Studies/Labs Reviewed:   EKG:  EKG is ordered today.  The ekg ordered today demonstrates Normal sinus rhythm without significant ST-T wave changes.  Recent Labs: No results found for requested labs within last 8760 hours.   Lipid Panel    Component Value Date/Time   CHOL 119 08/10/2014 0810   TRIG 134 08/10/2014 0810   HDL 28 (L) 08/10/2014 0810   CHOLHDL 4.3 08/10/2014 0810   VLDL  27 08/10/2014 0810   LDLCALC 64 08/10/2014 0810    Additional studies/ records that were reviewed today include:   Cath 12/30/2011 PCI RESULTS: 1. The circumflex PCI was performed using a 12 mm long by 3.5 mm Promus Premier post dilated to 3.75 mm.  2. The LAD was treated with a 3.0 x 16 overlapped distally with a 2.75 x 16 Promus Premier. The LAD procedure was complicated by the development of a distal dissection at the stent margin. Postdilatation pressure was 14 atmospheres postdilatation was 23 mm in diameter. Good stent apposition was felt to be present at the completion of the case. TIMI grade 3 flow was noted.  LEFT VENTRICULOGRAM:  Left ventricular angiogram was done in the 30 RAO projection and revealed inferoapical moderate to severe hypokinesis. Ejection fraction is 50%   IMPRESSIONS:  1. Severe proximal to mid LAD and mid circumflex stenoses as outlined above.   2. Normal right coronary artery.  3. Wall motion abnormality involving the inferoapical wall. Preserved LVEF at 50-55%  4. Successful circumflex DES implantation post dilated to 3.75 mm in diameter and an aneurysmal portion of the vessel. 0% stenosis was noted post deployment.  5. Successful deployment of overlapping stents in the proximal to mid LAD from 99% to 0% with TIMI grade 3 flow. Postdilatation diameter was 3.0 mm.    Echo 12/29/2013 LV EF: 45% -  50%  - Left ventricle: The cavity size was normal. There was  mild concentric hypertrophy. Systolic function was mildly reduced. The estimated ejection fraction was in the range of 45% to 50%. Mid to distal anteroapical and inferoapical hypokinesis, with mid-distal inferior "hinge" point. Prominent, thickened LV apical false tendon. Wall motion was normal; there were no regional wall motion abnormalities. Doppler parameters are consistent with abnormal left ventricular relaxation (grade 1 diastolic dysfunction). The E/e' ratio is <10, suggesting normal LV filling pressure. - Aortic valve: Trileaflet. Sclerosis without stenosis. No regurgitation. - Mitral valve: Mildly thickened leaflets . Trivial regurgitation. - Left atrium: The atrium was normal in size. - Inferior vena cava: The vessel was normal in size; the respirophasic diameter changes were in the normal range (= 50%); findings are consistent with normal central venous pressure. - Pericardium, extracardiac: There was no pericardial effusion.   Cath 07/05/2014 Procedural Findings: Hemodynamics: AO 125/70 mean 94 mm Hg LV 121/15 mm Hg  Coronary angiography: Coronary dominance: right  Left mainstem: Short, Normal.   Left anterior descending (LAD): 20-30% proximal LAD disease. The stents in the mid LAD are widely patent. There is 20% focal disease distal to the stent.  Left circumflex (LCx): The stents in the mid LCx are widely patent.   Right coronary artery (RCA): Mild disease in the proximal to mid vessel up to 20%.   Left ventriculography: Left ventricular systolic function is normal, LVEF is estimated at 55-65%, there is no significant mitral regurgitation   Final Conclusions:   1. Nonobstructive CAD. Continued patency of stents in the LAD and LCx. 2. Normal LV function.  ASSESSMENT:    1. Dyspnea, unspecified type   2. Coronary artery disease involving native coronary artery of native heart without angina pectoris   3.  Hyperlipidemia, unspecified hyperlipidemia type   4. Prediabetes   5. Other fatigue      PLAN:  In order of problems listed above:  1. Dyspnea - Unclear cause of his dyspnea, he says it is not consistent with exertion, sometimes he would become short of breath after walking a few  feet, other times he can push the lawnmower for 45 minutes without any issues. He also denies any burning sensation like he felt prior to his cath in 2015. EKG today is normal. - I will restart his cardiac medications which he has self discontinued 6 months ago after running out. If he still have symptom on follow-up, I recommend outpatient sleep study and potentially consider a treadmill Myoview as well  2. Fatigue  - He says he was feeling better while previously on the medication, unfortunately he self discontinued them. He wished to give them another try, and I do agree. He is planning to see his PCP very soon, I have recommended he obtain most of his labs at his PCPs office. I recommended he obtain CMP, CBC, TSH, fasting lipid panel and hemoglobin A1C and have results faxed to [place] as well.  3. CAD  - 2 DES in LAD and 1 DES in left circumflex in March 2015. Had a relook cath in October 2015 that showed patent stents. He described his previous angina symptom has frequent acid reflux which he has not had since stent placement.  4. HLD: Need a fasting lipid panel  5. Prediabtes: Previous hemoglobin A1C in 2015 was 6.0, prediabetic level, he will need another repeat hemoglobin A1C when he see his PCP.    Medication Adjustments/Labs and Tests Ordered: Current medicines are reviewed at length with the patient today.  Concerns regarding medicines are outlined above.  Medication changes, Labs and Tests ordered today are listed in the Patient Instructions below. Patient Instructions  Medication Instructions:  Your physician recommends that you continue on your current medications as directed. Please refer to the  Current Medication list given to you today.  Labwork: COMPLETE THESE FASTING LABS AT DR MEYERS OFFICE: TSH, LIPID, CBC, A1C, CMP-FAX OVER A COPY OF RESULTS TO OUR OFFICE  Testing/Procedures: NONE   Follow-Up: Your physician recommends that you schedule a follow-up appointment in: 1 MONTH WITH DR HILTY OR Merton Wadlow, PA ON A DAY DR HILTY IS IN THE OFFICE  Any Other Special Instructions Will Be Listed Below (If Applicable).  RX's sent over to pharmacy   If you need a refill on your cardiac medications before your next appointment, please call your pharmacy.     Ramond Dial, [place]  08/08/2016 9:00 AM    [facility] [address] [place], [place], [place]  [address] Phone: [phone]; Fax: [phone]

## 2016-08-31 ENCOUNTER — Encounter: Payer: Self-pay | Admitting: Physician Assistant

## 2016-08-31 ENCOUNTER — Ambulatory Visit (INDEPENDENT_AMBULATORY_CARE_PROVIDER_SITE_OTHER): Payer: BLUE CROSS/BLUE SHIELD | Admitting: Physician Assistant

## 2016-08-31 VITALS — BP 118/80 | HR 66 | Ht 72.0 in | Wt 210.6 lb

## 2016-08-31 DIAGNOSIS — R0609 Other forms of dyspnea: Secondary | ICD-10-CM

## 2016-08-31 DIAGNOSIS — R0683 Snoring: Secondary | ICD-10-CM

## 2016-08-31 DIAGNOSIS — I2510 Atherosclerotic heart disease of native coronary artery without angina pectoris: Secondary | ICD-10-CM | POA: Diagnosis not present

## 2016-08-31 DIAGNOSIS — R5383 Other fatigue: Secondary | ICD-10-CM

## 2016-08-31 DIAGNOSIS — E785 Hyperlipidemia, unspecified: Secondary | ICD-10-CM

## 2016-08-31 NOTE — Patient Instructions (Signed)
Your physician has requested that you have en exercise stress myoview. For further information please visit [email or web address] Please follow instruction sheet, as given.   Your physician recommends that you schedule a follow-up appointment in: 3 months with Dr Rennis Golden. Sooner if Nuclear study is abnormal.

## 2016-08-31 NOTE — Progress Notes (Signed)
Cardiology Office Note    Date:  08/31/2016   ID:  Ashish Rossetti, DOB Oct 10, 1960, MRN [id number]  PCP:  Joycelyn Rua, MD  Cardiologist:  Dr. Rennis Golden  Chief Complaint  Patient presents with  . Follow-up    seen for Dr. Rennis Golden    History of Present Illness:  Alan White is a 55 y.o. male with PMH of prediabetes, likely OSA, tobacco abuse, HLD, and CAD. Last hemoglobin A1c 6.0 on 12/28/2013. He was admitted for NSTEMI and underwent cath on 12/29/2013 which showed EF 50-55%, normal RCA, 85-90% mid LCx treated 3.5 x 12 mm Promus premiere DES postdilated to 3.75 mm, 90% proximal to mid LAD stenosis treated with a 3.0 x 16 mm Promus primary DES overlapped with a 2.75 x 16 mm Promus DES. Echocardiogram obtained 12/29/2013 showed EF 45-50%, mid to distal anteroapical and inferoapical hypokinesis, mild LVH. He had a recurrent chest discomfort and underwent relook cath in October 2015 that showed nonobstructive CAD, EF 55-65%. His last cardiology follow-up was on 06/24/2015.  I last saw the patient on 08/08/2016, he was complaining of some dyspnea however no consistent with exertion. EKG was normal. He had not taken his cardiac medication for 6 months as he ran out of meds. I restarted his cardiac medications. He was also going to follow-up with his primary care physician soon, so I recommended him to obtain CMP, CBC, TSH, fasting lipid panel and a hemoglobin A1c and have results faxed to [place]. I have not received any results since then.   Since the last time I saw the patient, he continued to have symptom of dyspnea on exertion. He says he was cleaning out the gutters for 5 hours last week without any issue, however while carrying some groceries for his wife, he became very short of breath walking a short distances. His dyspnea on exertion is not really consistent with the duration of exertion, it does not occur at rest either. He denies any obvious chest pain. There is no heart failure symptom on  physical exam. She has upcoming sleep study ordered by [facility] physician. 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   Past Medical History:  Diagnosis Date  . CAD (coronary artery disease), 12/29/13 PCI/DES LCX and PCI/DES LAD with overlapping DES  12/30/2013   cath 07/05/14 [place]  . Dyslipidemia, goal LDL below 70 12/30/2013  . GERD (gastroesophageal reflux disease)   . Metabolic syndrome, HgbA1C 6.0  12/30/2013  . Sleep apnea    by history  . Smoker     Past Surgical History:  Procedure Laterality Date  . CARDIAC CATHETERIZATION  07/05/14   patent stents  . CORONARY ANGIOPLASTY WITH STENT PLACEMENT  12/29/13   DES to LCX and overlapping stents DES mid LAD  . LEFT HEART CATHETERIZATION WITH CORONARY ANGIOGRAM N/A 12/29/2013   Procedure: LEFT HEART CATHETERIZATION WITH CORONARY ANGIOGRAM;  Surgeon: [name], MD;  Location: [facility] CATH LAB;  Service: Cardiovascular;  Laterality: N/A;  . LEFT HEART CATHETERIZATION WITH CORONARY ANGIOGRAM N/A 07/05/2014   Procedure: LEFT HEART CATHETERIZATION WITH CORONARY ANGIOGRAM;  Surgeon: Peter M Swaziland, MD;  Location: [facility] CATH LAB;  Service: Cardiovascular;  Laterality: N/A;  . PERCUTANEOUS CORONARY STENT INTERVENTION (PCI-S)  12/29/2013   Procedure: PERCUTANEOUS CORONARY STENT INTERVENTION (PCI-S);  Surgeon: [name], MD;  Location: [facility] CATH LAB;  Service: Cardiovascular;;  Current Medications: Outpatient Medications Prior to Visit  Medication Sig Dispense Refill  . atorvastatin (LIPITOR) 40 MG tablet Take 1 tablet (40 mg total) by mouth daily. 30 tablet 6  . carvedilol (COREG) 3.125 MG tablet Take 1 tablet (3.125 mg total) by mouth 2 (two) times daily with a meal. 60 tablet 6  . clopidogrel (PLAVIX) 75 MG tablet Take 1 tablet (75 mg total) by mouth daily. 30 tablet 6  . esomeprazole (NEXIUM 24HR) 20 MG capsule Take 20 mg by mouth daily at 12 noon.     . nitroGLYCERIN (NITROSTAT) 0.4 MG SL tablet Place 1 tablet (0.4 mg total) under the tongue every 5 (five) minutes x 3 doses as needed for chest pain. 25 tablet 4   No facility-administered medications prior to visit.      Allergies:   Patient has no known allergies.   Social History   Social History  . Marital status: Married    Spouse name:    . Number of children: N/A  . Years of education: N/A   Occupational History  . Manager [employer]   Social History Main Topics  . Smoking status: Current Every Day Smoker    Packs/day: 1.00    Years: 30.00    Types: Cigarettes  . Smokeless tobacco: Never Used  . Alcohol use No     Comment: "Once in a blue moon"  . Drug use: No  . Sexual activity: Not Asked   Other Topics Concern  . None   Social History Narrative   Pt lives with wife.     Family History:  The patient's family history includes Cancer in his father and mother; Diabetes in his father and mother; Heart attack in his brother and brother; Stroke in his sister.   ROS:   Please see the history of present illness.    ROS All other systems reviewed and are negative.   PHYSICAL EXAM:   VS:  BP 118/80   Pulse 66   Ht 6' (1.829 m)   Wt 210 lb 9.6 oz (95.5 kg)   SpO2 98%   BMI 28.56 kg/m    GEN: Well nourished, well developed, in no acute distress  HEENT: normal  Neck: no JVD, carotid bruits, or masses Cardiac: RRR; no murmurs, rubs, or gallops,no edema  Respiratory:  clear to auscultation bilaterally, normal work of breathing GI: soft, nontender, nondistended, + BS MS: no deformity or atrophy  Skin: warm and dry, no rash Neuro:  Alert and Oriented x 3, Strength and sensation are intact Psych: euthymic mood, full affect  Wt Readings from Last 3 Encounters:  08/31/16 210 lb 9.6 oz (95.5 kg)  08/08/16 209 lb 9.6 oz (95.1 kg)  06/24/15 207 lb 4.8 oz (94 kg)      Studies/Labs Reviewed:   EKG:  EKG is not ordered today.   Recent Labs: No  results found for requested labs within last 8760 hours.   Lipid Panel    Component Value Date/Time   CHOL 119 08/10/2014 0810   TRIG 134 08/10/2014 0810   HDL 28 (L) 08/10/2014 0810   CHOLHDL 4.3 08/10/2014 0810   VLDL 27 08/10/2014 0810   LDLCALC 64 08/10/2014 0810    Additional studies/ records that were reviewed today include:   Cath 12/30/2011 PCI RESULTS:1. The circumflex PCI was performed using a 12 mm long by 3.5 mm Promus Premier post dilated to 3.75 mm.  2. The LAD was treated with a 3.0  x 16 overlapped distally with a 2.75 x 16 Promus Premier. The LAD procedure was complicated by the development of a distal dissection at the stent margin. Postdilatation pressure was 14 atmospheres postdilatation was 23 mm in diameter. Good stent apposition was felt to be present at the completion of the case. TIMI grade 3 flow was noted.  LEFT VENTRICULOGRAM:Left ventricular angiogram was done in the 30 RAO projection and revealed inferoapical moderate to severe hypokinesis. Ejection fraction is 50%   IMPRESSIONS:1.Severe proximal to mid LAD and mid circumflex stenoses as outlined above.   2. Normal right coronary artery.  3. Wall motion abnormality involving the inferoapical wall. Preserved LVEF at 50-55%  4. Successful circumflex DES implantation post dilated to 3.75 mm in diameter and an aneurysmal portion of the vessel. 0% stenosis was noted post deployment.  5. Successful deployment of overlapping stents in the proximal to mid LAD from 99% to 0% with TIMI grade 3 flow. Postdilatation diameter was 3.0 mm.    Echo 12/29/2013 LV EF: 45% -  50%  - Left ventricle: The cavity size was normal. There was mild concentric hypertrophy. Systolic function was mildly reduced. The estimated ejection fraction was in the range of 45% to 50%. Mid to distal anteroapical and inferoapical hypokinesis, with mid-distal inferior "hinge" point. Prominent, thickened LV  apical false tendon. Wall motion was normal; there were no regional wall motion abnormalities. Doppler parameters are consistent with abnormal left ventricular relaxation (grade 1 diastolic dysfunction). The E/e' ratio is <10, suggesting normal LV filling pressure. - Aortic valve: Trileaflet. Sclerosis without stenosis. No regurgitation. - Mitral valve: Mildly thickened leaflets . Trivial regurgitation. - Left atrium: The atrium was normal in size. - Inferior vena cava: The vessel was normal in size; the respirophasic diameter changes were in the normal range (= 50%); findings are consistent with normal central venous pressure. - Pericardium, extracardiac: There was no pericardial effusion.   Cath 07/05/2014 Procedural Findings: Hemodynamics: AO 125/70 mean 94 mm Hg LV 121/15 mm Hg  Coronary angiography: Coronary dominance:right  Left mainstem:Short, Normal.   Left anterior descending (LAD):20-30% proximal LAD disease. The stents in the mid LAD are widely patent. There is 20% focal disease distal to the stent.  Left circumflex (LCx):The stents in the mid LCx are widely patent.   Right coronary artery (RCA):Mild disease in the proximal to mid vessel up to 20%.   Left ventriculography: Left ventricular systolic function is normal, LVEF is estimated at 55-65%, there is no significant mitral regurgitation   Final Conclusions:  1. Nonobstructive CAD. Continued patency of stents in the LAD and LCx. 2. Normal LV function.    ASSESSMENT:    1. DOE (dyspnea on exertion)   2. Coronary artery disease involving native coronary artery of native heart without angina pectoris   3. Hyperlipidemia, unspecified hyperlipidemia type   4. Fatigue, unspecified type   5. Snoring      PLAN:  In order of problems listed above:  1. Dyspnea on exertion: No obvious chest pain, however symptom persisted despite putting him back on cardiac medication for  the past month. Lab sent over by PCPs office has been normal. He is still in the prediabetes range based on the lab. His dyspnea on exertion is not very consistent, sometimes he can walk 4 hours with no symptom, other times he will become very short of breath during object and walk a short distance. We'll proceed with treadmill Myoview, I have instructed the patient to hold his carvedilol and  avoid caffeinated drinks the night before and in the morning of the test. I have also instructed him to be nothing by mouth in the morning of the test. If he is unable to keep up with the treadmill, and I am ok with switching to lexiscan   2. CAD: 2 DES in LAD and 1 DES in left circumflex in March 2015. Had a relook cath in October 2015 that showed patent stents.   3. HLD: continue lipitor. Lipid panel recent obtained by PCP  4. Prediabetes: Hemoglobin A1c 6.0 in 2015 and recent lab  5. Other fatigue: unclear cause, r/o coronary ischemia, TSH normal.   6. Snoring: [facility]   Medication Adjustments/Labs and Tests Ordered: Current medicines are reviewed at length with the patient today.  Concerns regarding medicines are outlined above.  Medication changes, Labs and Tests ordered today are listed in the Patient Instructions below. Patient Instructions  Your physician has requested that you have en exercise stress myoview. For further information please visit [email or web address]. Please follow instruction sheet, as given.   Your physician recommends that you schedule a follow-up appointment in: 3 months with Dr Rennis GoldenHilty. Sooner if Nuclear study is abnormal.     Signed, Azalee CourseHao Olevia Westervelt, [place]  08/31/2016 6:01 PM    [facility] [address], [place], [place]  [address] Phone: [phone]; Fax: [phone]

## 2016-09-07 ENCOUNTER — Telehealth (HOSPITAL_COMMUNITY): Payer: Self-pay

## 2016-09-07 NOTE — Telephone Encounter (Signed)
Encounter complete. 

## 2016-09-12 ENCOUNTER — Ambulatory Visit (HOSPITAL_COMMUNITY)
Admission: RE | Admit: 2016-09-12 | Discharge: 2016-09-12 | Disposition: A | Discharge status: Home / Self Care | Payer: BLUE CROSS/BLUE SHIELD | Source: Ambulatory Visit | Attending: Cardiovascular Disease | Admitting: Cardiovascular Disease

## 2016-09-12 DIAGNOSIS — Z955 Presence of coronary angioplasty implant and graft: Secondary | ICD-10-CM | POA: Diagnosis not present

## 2016-09-12 DIAGNOSIS — E785 Hyperlipidemia, unspecified: Secondary | ICD-10-CM | POA: Insufficient documentation

## 2016-09-12 DIAGNOSIS — F17200 Nicotine dependence, unspecified, uncomplicated: Secondary | ICD-10-CM | POA: Diagnosis not present

## 2016-09-12 DIAGNOSIS — E669 Obesity, unspecified: Secondary | ICD-10-CM | POA: Insufficient documentation

## 2016-09-12 DIAGNOSIS — G4733 Obstructive sleep apnea (adult) (pediatric): Secondary | ICD-10-CM | POA: Diagnosis not present

## 2016-09-12 DIAGNOSIS — Z8249 Family history of ischemic heart disease and other diseases of the circulatory system: Secondary | ICD-10-CM | POA: Diagnosis not present

## 2016-09-12 DIAGNOSIS — I252 Old myocardial infarction: Secondary | ICD-10-CM | POA: Diagnosis not present

## 2016-09-12 DIAGNOSIS — I2510 Atherosclerotic heart disease of native coronary artery without angina pectoris: Secondary | ICD-10-CM | POA: Diagnosis not present

## 2016-09-12 DIAGNOSIS — R0609 Other forms of dyspnea: Secondary | ICD-10-CM | POA: Diagnosis present

## 2016-09-12 LAB — MYOCARDIAL PERFUSION IMAGING
CHL CUP MPHR: 163 {beats}/min
CHL CUP NUCLEAR SSS: 2
CHL CUP RESTING HR STRESS: 63 {beats}/min
CHL RATE OF PERCEIVED EXERTION: 17
CSEPED: 9 min
CSEPEW: 10.1 METS
CSEPHR: 87 %
Exercise duration (sec): 0 s
LV dias vol: 110 mL (ref 62–150)
LV sys vol: 43 mL
NUC STRESS TID: 0.99
Peak HR: 142 {beats}/min
SDS: 1
SRS: 1

## 2016-09-12 MED ORDER — TECHNETIUM TC 99M TETROFOSMIN IV KIT
30.8000 | PACK | Freq: Once | INTRAVENOUS | Status: AC | PRN
  Administered 2016-09-12: 30.8 via INTRAVENOUS
  Filled 2016-09-12: qty 31

## 2016-09-12 MED ORDER — TECHNETIUM TC 99M TETROFOSMIN IV KIT
10.6000 | PACK | Freq: Once | INTRAVENOUS | Status: AC | PRN
  Administered 2016-09-12: 10.6 via INTRAVENOUS
  Filled 2016-09-12: qty 11

## 2016-09-14 ENCOUNTER — Other Ambulatory Visit: Payer: Self-pay | Admitting: *Deleted

## 2016-09-14 ENCOUNTER — Telehealth: Payer: Self-pay | Admitting: Physician Assistant

## 2016-09-14 MED ORDER — ISOSORBIDE MONONITRATE ER 30 MG PO TB24: 30.0000 mg | ORAL_TABLET | Freq: Every day | ORAL | 5 refills | Status: DC

## 2016-09-14 NOTE — Telephone Encounter (Signed)
Spoke to patient regarding recommendations.   He's aware that he should start on this and plan to take, if he notes he's having new symptoms or problems or this is not helping w his current symptoms, to call. He's aware that nitrates may produce a drop in blood pressure or cause headaches, to let us know if this happens so that a dose reduction vs discontinuation of medication may be considered.  He voiced understanding and thanks, and will pick up Rx today.

## 2016-09-14 NOTE — Telephone Encounter (Signed)
Notes Recorded by Ladell Heads, RN on 09/14/2016 at 3:34 PM EST Have left patient a message to call. ------  Notes Recorded by Azalee Course, PA on 09/14/2016 at 3:23 PM EST I have tried to contact the patient twice without success since this morning to inform him the mildly abnormal stress test, per discussion with Dr. Rennis Golden and I, we opted for medical therapy. Plan to add 30mg  Imdur daily ------  Notes Recorded by Azalee Course, PA on 09/14/2016 at 8:49 AM EST I have discussed with patient's primary cardiologist Dr. Rennis Golden regarding this myoview, there is mild ischemia in the inferior portion of the heart, however it is very mild, we plan for medical management at this time with addition of Imdur ------  Notes Recorded by Azalee Course, PA on 09/12/2016 at 3:27 PM EST Can you take a look at this one. Main concern is dyspnea on exertion, this patient self discontinued his cardiac med, however despite resuming his medication, he continued to have intermittent DOE.

## 2016-09-14 NOTE — Telephone Encounter (Signed)
New message  ° ° °Pt verbalized that he is returning call for rn °

## 2017-02-15 ENCOUNTER — Other Ambulatory Visit: Payer: Self-pay | Admitting: Physician Assistant

## 2017-02-15 NOTE — Telephone Encounter (Signed)
Refill Request.  

## 2017-02-18 NOTE — Telephone Encounter (Signed)
Rx(s) sent to pharmacy electronically.  

## 2017-03-23 ENCOUNTER — Other Ambulatory Visit: Payer: Self-pay | Admitting: Internal Medicine

## 2017-04-18 ENCOUNTER — Other Ambulatory Visit: Payer: Self-pay | Admitting: Physician Assistant

## 2017-04-18 NOTE — Telephone Encounter (Signed)
Please review for refill, Thanks !  

## 2017-04-27 ENCOUNTER — Other Ambulatory Visit: Payer: Self-pay | Admitting: Physician Assistant

## 2017-04-29 NOTE — Telephone Encounter (Signed)
Please review for refill, Thanks !  

## 2017-05-30 ENCOUNTER — Other Ambulatory Visit: Payer: Self-pay | Admitting: *Deleted

## 2017-05-30 ENCOUNTER — Other Ambulatory Visit: Payer: Self-pay | Admitting: Internal Medicine

## 2017-05-30 MED ORDER — CLOPIDOGREL BISULFATE 75 MG PO TABS: 75.0000 mg | ORAL_TABLET | Freq: Every day | ORAL | 3 refills | Status: DC

## 2017-07-26 ENCOUNTER — Other Ambulatory Visit: Payer: Self-pay

## 2017-07-26 MED ORDER — ISOSORBIDE MONONITRATE ER 30 MG PO TB24: 30.0000 mg | ORAL_TABLET | Freq: Every day | ORAL | 0 refills | Status: DC

## 2017-09-01 ENCOUNTER — Other Ambulatory Visit: Payer: Self-pay | Admitting: Internal Medicine

## 2017-10-21 ENCOUNTER — Other Ambulatory Visit: Payer: Self-pay | Admitting: Internal Medicine

## 2017-11-18 ENCOUNTER — Encounter: Payer: Self-pay | Admitting: Physician Assistant

## 2017-11-18 ENCOUNTER — Other Ambulatory Visit: Payer: Self-pay | Admitting: Physician Assistant

## 2017-11-18 ENCOUNTER — Ambulatory Visit: Payer: BLUE CROSS/BLUE SHIELD | Admitting: Physician Assistant

## 2017-11-18 VITALS — BP 118/88 | Ht 72.0 in | Wt 221.0 lb

## 2017-11-18 DIAGNOSIS — R600 Localized edema: Secondary | ICD-10-CM | POA: Diagnosis not present

## 2017-11-18 DIAGNOSIS — R7303 Prediabetes: Secondary | ICD-10-CM | POA: Diagnosis not present

## 2017-11-18 DIAGNOSIS — E785 Hyperlipidemia, unspecified: Secondary | ICD-10-CM | POA: Diagnosis not present

## 2017-11-18 DIAGNOSIS — I2583 Coronary atherosclerosis due to lipid rich plaque: Secondary | ICD-10-CM

## 2017-11-18 DIAGNOSIS — M79604 Pain in right leg: Secondary | ICD-10-CM | POA: Diagnosis not present

## 2017-11-18 DIAGNOSIS — M79605 Pain in left leg: Principal | ICD-10-CM

## 2017-11-18 DIAGNOSIS — I2510 Atherosclerotic heart disease of native coronary artery without angina pectoris: Secondary | ICD-10-CM | POA: Diagnosis not present

## 2017-11-18 MED ORDER — CLOPIDOGREL BISULFATE 75 MG PO TABS: 75.0000 mg | ORAL_TABLET | Freq: Every day | ORAL | 7 refills | Status: DC

## 2017-11-18 MED ORDER — CARVEDILOL 3.125 MG PO TABS: 3.1250 mg | ORAL_TABLET | Freq: Two times a day (BID) | ORAL | 8 refills | Status: DC

## 2017-11-18 MED ORDER — ATORVASTATIN CALCIUM 40 MG PO TABS: 40.0000 mg | ORAL_TABLET | Freq: Every day | ORAL | 8 refills | Status: DC

## 2017-11-18 MED ORDER — ISOSORBIDE MONONITRATE ER 30 MG PO TB24: 30.0000 mg | ORAL_TABLET | Freq: Every day | ORAL | 8 refills | Status: DC

## 2017-11-18 NOTE — Patient Instructions (Signed)
Medication Instructions:  Your physician recommends that you continue on your current medications as directed. Please refer to the Current Medication list given to you today.  Labwork: None   Testing/Procedures: Your physician has requested that you have an ankle brachial index (ABI). During this test an ultrasound and blood pressure cuff are used to evaluate the arteries that supply the arms and legs with blood. Allow thirty minutes for this exam. There are no restrictions or special instructions.  Your physician has requested that you have a lower or extremity LEFT venous duplex. This test is an ultrasound of the veins in the legs. It looks at venous blood flow that carries blood from the heart to the legs. Allow one hour for a Lower Venous exam. There are no restrictions or special instructions.  Follow-Up: Your physician wants you to follow-up in: 6 months with Dr Rennis Golden. You will receive a reminder letter in the mail two months in advance. If you don't receive a letter, please call our office to schedule the follow-up appointment.  Any Other Special Instructions Will Be Listed Below (If Applicable). If you need a refill on your cardiac medications before your next appointment, please call your pharmacy.

## 2017-11-18 NOTE — Progress Notes (Signed)
Cardiology Office Note    Date:  11/19/2017   ID:  Alan White, DOB [birth year], MRN [id number]  PCP:  Joycelyn Rua, MD  Cardiologist:  Dr. Rennis Golden   Chief Complaint  Patient presents with  . Follow-up    seen for Dr. Rennis Golden  . Edema    Left leg     History of Present Illness:  Alan White is a 57 y.o. male with PMH of prediabetes, likely OSA, tobacco abuse, HLD, and CAD. Last hemoglobin A1c 6.0 on 12/28/2013. He was admitted for NSTEMI and underwent cath on 3/31/2015which showed EF 50-55%, normal RCA, 85-90% mid LCx treated 3.5 x 12 mm Promus premiere DES postdilated to 3.75 mm, 90% proximal to mid LAD stenosis treated with a 3.0 x 16 mm Promus primary DES overlapped with a 2.75 x 16 mm Promus DES. Echocardiogram obtained 12/29/2013 showed EF 45-50%, mid to distal anteroapical and inferoapical hypokinesis, mild LVH. He had a recurrent chest discomfort and underwent relook cath in October 2015 that showed nonobstructive CAD, EF 55-65%. His last cardiology follow-up was on 06/24/2015.  I saw the patient in December 2017 for some dyspnea, however not consistent with exertion.  He did not take his cardiac medication for 48-month after running out of medications.  I restarted his cardiac medications.  When we brought him back for follow-up, he continued to have dyspnea.  I ordered a Myoview which was performed on 09/12/2016, this showed EF 61%, horizontal ST segment depression in inferolateral leads, there were mild ischemia mainly in the inferior portion of the heart.  I discussed the case with Dr. Rennis Golden, we recommended a trial of medical therapy.  I added 30 mg Imdur to his medication regimen.  He was supposed to follow-up with Dr. Rennis Golden in 3 months, this never occurred.  Patient presents today for cardiology office visit.  He still has very mild dyspnea on exertion, this actually has improved compared to before.  He occasionally has some sharp stabbing chest pain at rest that only last a  few seconds each.  This does not occur with exertion.  This symptom seems to be quite rare and infrequent.  I will hold off on further ischemic workup.  I have refilled his medications.  His main complaint today is actually left lower extremity swelling and pain.  He says his upper thigh tend to be swollen quite a bit in the afternoon.  I will obtain a left-sided venous Doppler.  However he also says he has pain with ambulation on the left side they improved with rest.  On physical exam, he has at least 1+ dorsalis pedis and 1+ posterior tibial pulse.  I will obtain bilateral ABI, however there is some suspicion that his leg discomfort may be related to musculoskeletal issue, especially when his left foot arch is tender on palpation.    Past Medical History:  Diagnosis Date  . CAD (coronary artery disease), 12/29/13 PCI/DES LCX and PCI/DES LAD with overlapping DES  12/30/2013   cath 07/05/14 [place]  . Dyslipidemia, goal LDL below 70 12/30/2013  . GERD (gastroesophageal reflux disease)   . Metabolic syndrome, HgbA1C 6.0  12/30/2013  . Sleep apnea    by history  . Smoker     Past Surgical History:  Procedure Laterality Date  . CARDIAC CATHETERIZATION  07/05/14   patent stents  . CORONARY ANGIOPLASTY WITH STENT PLACEMENT  12/29/13   DES to LCX and overlapping stents DES mid LAD  . LEFT HEART CATHETERIZATION WITH CORONARY  ANGIOGRAM N/A 12/29/2013   Procedure: LEFT HEART CATHETERIZATION WITH CORONARY ANGIOGRAM;  Surgeon: [name], MD;  Location: [facility] CATH LAB;  Service: Cardiovascular;  Laterality: N/A;  . LEFT HEART CATHETERIZATION WITH CORONARY ANGIOGRAM N/A 07/05/2014   Procedure: LEFT HEART CATHETERIZATION WITH CORONARY ANGIOGRAM;  Surgeon: Peter M Swaziland, MD;  Location: [facility] CATH LAB;  Service: Cardiovascular;  Laterality: N/A;  . PERCUTANEOUS CORONARY STENT INTERVENTION (PCI-S)  12/29/2013   Procedure: PERCUTANEOUS CORONARY STENT INTERVENTION (PCI-S);  Surgeon: [name], MD;  Location: [facility]  CATH LAB;  Service: Cardiovascular;;    Current Medications: Outpatient Medications Prior to Visit  Medication Sig Dispense Refill  . esomeprazole (NEXIUM 24HR) 20 MG capsule Take 20 mg by mouth daily at 12 noon.    . nitroGLYCERIN (NITROSTAT) 0.4 MG SL tablet Place 1 tablet (0.4 mg total) under the tongue every 5 (five) minutes x 3 doses as needed for chest pain. 25 tablet 4  . atorvastatin (LIPITOR) 40 MG tablet TAKE 1 TABLET (40 MG TOTAL) BY MOUTH DAILY. 30 tablet 0  . carvedilol (COREG) 3.125 MG tablet TAKE 1 TABLET (3.125 MG TOTAL) BY MOUTH 2 (TWO) TIMES DAILY WITH A MEAL. 60 tablet 3  . clopidogrel (PLAVIX) 75 MG tablet TAKE 1 TABLET BY MOUTH EVERY DAY 90 tablet 0  . isosorbide mononitrate (IMDUR) 30 MG 24 hr tablet TAKE 1 TABLET (30 MG TOTAL) BY MOUTH DAILY. PLEASE CONTACT OFFICE FOR ADDITIONAL REFILLS 30 tablet 0   No facility-administered medications prior to visit.      Allergies:   Patient has no known allergies.   Social History   Socioeconomic History  . Marital status: Married    Spouse name:    . Number of children: None  . Years of education: None  . Highest education level: None  Social Needs  . Financial resource strain: None  . Food insecurity - worry: None  . Food insecurity - inability: None  . Transportation needs - medical: None  . Transportation needs - non-medical: None  Occupational History  . Occupation: Event organiser: [employer]  Tobacco Use  . Smoking status: Current Every Day Smoker    Packs/day: 1.00    Years: 30.00    Pack years: 30.00    Types: Cigarettes  . Smokeless tobacco: Never Used  Substance and Sexual Activity  . Alcohol use: No    Comment: "Once in a blue moon"  . Drug use: No  . Sexual activity: None  Other Topics Concern  . None  Social History Narrative   Pt lives with wife.     Family History:  The patient's family history includes Cancer in his father and mother; Diabetes in his father and mother; Heart  attack in his brother and brother; Stroke in his sister.   ROS:   Please see the history of present illness.    ROS All other systems reviewed and are negative.   PHYSICAL EXAM:   VS:  BP 118/88 (BP Location: Right Arm, Patient Position: Sitting, Cuff Size: Normal)   Ht 6' (1.829 m)   Wt 221 lb (100.2 kg)   BMI 29.97 kg/m    GEN: Well nourished, well developed, in no acute distress  HEENT: normal  Neck: no JVD, carotid bruits, or masses Cardiac: RRR; no murmurs, rubs, or gallops,no edema  Respiratory:  clear to auscultation bilaterally, normal work of breathing GI: soft, nontender, nondistended, + BS MS: no deformity or atrophy  Skin: warm and dry, no rash Neuro:  Alert and Oriented x 3, Strength and sensation are intact Psych: euthymic mood, full affect  Wt Readings from Last 3 Encounters:  11/18/17 221 lb (100.2 kg)  09/12/16 210 lb (95.3 kg)  08/31/16 210 lb 9.6 oz (95.5 kg)      Studies/Labs Reviewed:   EKG:  EKG is ordered today.  The ekg ordered today demonstrates normal sinus rhythm without significant ST-T wave changes  Recent Labs: No results found for requested labs within last 8760 hours.   Lipid Panel    Component Value Date/Time   CHOL 119 08/10/2014 0810   TRIG 134 08/10/2014 0810   HDL 28 (L) 08/10/2014 0810   CHOLHDL 4.3 08/10/2014 0810   VLDL 27 08/10/2014 0810   LDLCALC 64 08/10/2014 0810    Additional studies/ records that were reviewed today include:   Myoview 09/12/2016 Study Highlights     The left ventricular ejection fraction is normal (55-65%).  Nuclear stress EF: 61%.  Findings consistent with ischemia.  This is a low risk study.  Horizontal ST segment depression ST segment depression of 1 mm was noted during stress in the III, aVF, II, V4, V5 and V6 leads.   Positive ECG with exercise Mild inferior wall ischemia at mid and basal level EF 61%        ASSESSMENT:    1. Pain in both lower extremities   2. Leg edema     3. Coronary artery disease due to lipid rich plaque   4. Hyperlipidemia, unspecified hyperlipidemia type   5. Prediabetes      PLAN:  In order of problems listed above:  1. Left lower extremity pain: Worse with ambulation, will obtain ABI.  He does have 1+ pulse in the dorsalis pedis and posterior tibial artery, if ABI is negative, I suspect he likely has a component of musculoskeletal pain.  2. Left thigh edema: Unclear cause, will obtain venous Doppler.  3. CAD: He still have some baseline dyspnea, previous Myoview was slightly abnormal, however overall considered overall low risk.  His symptoms improved after I added 30 mg daily of Imdur.  Continue Plavix.  4. Prediabetes: Managed by primary care provider.  5. Hyperlipidemia: Continue Lipitor 40 mg daily.  Fasting lipid panel and LFT by PCP.    Medication Adjustments/Labs and Tests Ordered: Current medicines are reviewed at length with the patient today.  Concerns regarding medicines are outlined above.  Medication changes, Labs and Tests ordered today are listed in the Patient Instructions below. Patient Instructions  Medication Instructions:  Your physician recommends that you continue on your current medications as directed. Please refer to the Current Medication list given to you today.  Labwork: None   Testing/Procedures: Your physician has requested that you have an ankle brachial index (ABI). During this test an ultrasound and blood pressure cuff are used to evaluate the arteries that supply the arms and legs with blood. Allow thirty minutes for this exam. There are no restrictions or special instructions.  Your physician has requested that you have a lower or extremity LEFT venous duplex. This test is an ultrasound of the veins in the legs. It looks at venous blood flow that carries blood from the heart to the legs. Allow one hour for a Lower Venous exam. There are no restrictions or special  instructions.  Follow-Up: Your physician wants you to follow-up in: 6 months with Dr Rennis Golden. You will receive a reminder letter in the mail two  months in advance. If you don't receive a letter, please call our office to schedule the follow-up appointment.  Any Other Special Instructions Will Be Listed Below (If Applicable). If you need a refill on your cardiac medications before your next appointment, please call your pharmacy.     Ramond Dial, [place]  11/19/2017 11:45 PM    [facility] [address] [place], [place], [place]  [address] Phone: [phone]; Fax: [phone]

## 2017-11-19 ENCOUNTER — Encounter: Payer: Self-pay | Admitting: Physician Assistant

## 2017-11-19 ENCOUNTER — Ambulatory Visit (HOSPITAL_COMMUNITY)
Admission: RE | Admit: 2017-11-19 | Discharge: 2017-11-19 | Disposition: A | Discharge status: Home / Self Care | Payer: BLUE CROSS/BLUE SHIELD | Source: Ambulatory Visit | Attending: Cardiology | Admitting: Cardiology

## 2017-11-19 DIAGNOSIS — M79605 Pain in left leg: Secondary | ICD-10-CM | POA: Diagnosis not present

## 2017-11-19 DIAGNOSIS — M79604 Pain in right leg: Secondary | ICD-10-CM | POA: Diagnosis not present

## 2017-11-19 DIAGNOSIS — I2583 Coronary atherosclerosis due to lipid rich plaque: Secondary | ICD-10-CM | POA: Diagnosis not present

## 2017-11-19 DIAGNOSIS — I2510 Atherosclerotic heart disease of native coronary artery without angina pectoris: Secondary | ICD-10-CM | POA: Diagnosis not present

## 2017-11-19 DIAGNOSIS — R600 Localized edema: Secondary | ICD-10-CM | POA: Insufficient documentation

## 2017-11-19 NOTE — Progress Notes (Signed)
No DVT noted

## 2017-11-27 ENCOUNTER — Encounter (HOSPITAL_COMMUNITY): Payer: BLUE CROSS/BLUE SHIELD

## 2017-11-28 ENCOUNTER — Ambulatory Visit (HOSPITAL_COMMUNITY)
Admission: RE | Admit: 2017-11-28 | Payer: BLUE CROSS/BLUE SHIELD | Source: Ambulatory Visit | Attending: Physician Assistant | Admitting: Physician Assistant

## 2017-12-03 ENCOUNTER — Ambulatory Visit: Payer: BLUE CROSS/BLUE SHIELD | Admitting: Physician Assistant

## 2018-01-22 DIAGNOSIS — H6690 Otitis media, unspecified, unspecified ear: Secondary | ICD-10-CM | POA: Diagnosis not present

## 2018-02-28 ENCOUNTER — Telehealth: Payer: Self-pay | Admitting: Internal Medicine

## 2018-02-28 NOTE — Telephone Encounter (Signed)
Called patient and LVM to call back to schedule his 6 month ov with Dr. Rennis Golden.

## 2018-03-03 ENCOUNTER — Telehealth: Payer: Self-pay | Admitting: Internal Medicine

## 2018-03-03 NOTE — Telephone Encounter (Signed)
Called patient and LVM to call back to schedule the 6 month ov with Dr. Rennis Golden.

## 2018-03-03 NOTE — Telephone Encounter (Signed)
Called patient and LVM to call back to schedule 6 month ov with Dr. Rennis Golden.

## 2018-05-29 ENCOUNTER — Encounter: Payer: Self-pay | Admitting: Internal Medicine

## 2018-05-29 ENCOUNTER — Ambulatory Visit: Payer: BLUE CROSS/BLUE SHIELD | Admitting: Internal Medicine

## 2018-05-29 VITALS — BP 120/78 | Ht 72.0 in | Wt 215.0 lb

## 2018-05-29 DIAGNOSIS — E785 Hyperlipidemia, unspecified: Secondary | ICD-10-CM

## 2018-05-29 DIAGNOSIS — I2510 Atherosclerotic heart disease of native coronary artery without angina pectoris: Secondary | ICD-10-CM | POA: Diagnosis not present

## 2018-05-29 DIAGNOSIS — R5383 Other fatigue: Secondary | ICD-10-CM

## 2018-05-29 NOTE — Patient Instructions (Signed)
Your physician recommends that you return for lab work TODAY to check cholesterol & metabolic panel   Your physician wants you to follow-up in: ONE YEAR with Dr. Rennis Golden. You will receive a reminder letter in the mail two months in advance. If you don't receive a letter, please call our office to schedule the follow-up appointment.

## 2018-05-29 NOTE — Progress Notes (Signed)
[facility]    OFFICE NOTE  Chief Complaint:  No complaints  Primary Care Physician: Joycelyn Rua, MD  HPI:  Alan White is a 57 y.o. male with no history of CAD. CRFs are tobacco, FH. No known history of HTN, HL, DM. He presented 12/28/13 with a 4 day history of intermittent chest pressure. It was left substernal, radiating to both arms. It was 8/10 at its worst. It had occurred at rest and with exertion. He tried OTC PPI and Tums, without relief. One time, he took ASA which helped. It seems to happen more often in the afternoon/evening. It has also woken him from sleep. He had SOB with it it if occurred with exertion, but also describes DOE. He has had no N&V or diaphoresis. He told his wife about it 3 days ago, has had multiple episodes. He took a day off from work and came to the ER because it was scaring him. He was admitted by Dr. Rennis Golden with addition of IV heparin and NTG. Cardiac enzymes were negative- exceptfor 0.11 of troponin marker. Pt underwent cardiac cath with findings of:   1. Severe proximal to mid LAD and mid circumflex stenoses as outlined above.  2. Normal right coronary artery.  3. Wall motion abnormality involving the inferoapical wall. Preserved LVEF at 50-55%  4. Successful circumflex DES implantation post dilated to 3.75 mm in diameter and an aneurysmal portion of the vessel. 0% stenosis was noted post deployment.  5. Successful deployment of overlapping stents in the proximal to mid LAD from 99% to 0% with TIMI grade 3 flow. Postdilatation diameter was 3.0   Dual antiplatelet therapy for greater than 12 months. Brilinta can be switched to Plavix at 6 months.   Pt did well post procedure. EKG with incomplete RBBB, t wave inversions in ant lat leads. Was seen by Dr. Swaziland and found stable for discharge. Will ambulate with cardiac rehab first. Have called [facility] to arrange new pt appointment, they are to call pt.   Other issues borderline diabetes will have  dietician see before discharge. Pt on statin, BB and asa, Brilinta. BP is borderline, ACE will be added as outpatient. No work until  01/04/14.  Alan White had been reporting very similar left chest discomfort that he had prior to his original stents. He reported a soreness under the left breast which improved with elevating the left breast. This is very atypical however seem to improve significantly after having his stents placed. He said that he felt great for about 3 months after having stents placed but is since become more short of breath and continues to have chest discomfort. He presented the hospital with similar complaints in early October and underwent a repeat cardiac catheterization which showed widely patent stents. He was discharged without changes in his medication. He comes back today with again similar complaints in the left chest. Unclear whether this is angina or not. He may have had some improvement with a nitrate. Again he is describing some shortness of breath.  Alan White returns today and reports she is feeling the best he has in years. He took his isosorbide for about a week and then had awful headaches and discontinued it. He did notice a change during that week and improvement in his chest pain symptoms, however. He also was switched from Brilinta to Plavix and seems to be tolerating this well. His P2 Y 12 assay indicated 221,which is adequate platelet suppression.  I saw Alan White back in the  office today. Overall he tends to feel fatigued at times. He said initially it felt the best that he had in years after his PCI but now seems to tired fairly easily. He was ordered for sleep study however that has not yet been performed. He also has some significant anxiety and continues to smoke. He's been very difficult for him to quit smoking. I do believe anxiety is playing a role in this. He denies anymore cardiac chest pain.  Alan White returns today for follow-up. Overall he is feeling  excellent today. He denies any chest pain or shortness of breath. He's been dealing with a kidney stone but he seeing a urologist for this. He has significantly cut back his smoking down to 5 cigarettes a day. He is not started Zyban, but he has filled the prescription and said that he would start the medicine if he cannot stop smoking on his own.  05/29/2018  Alan White is seen today in follow-up.  He denies any chest pain or worsening shortness of breath.  He recently saw Azalee Course, PA-C in February 2019.  He is complaining of some leg pain and underwent venous Dopplers which were negative for DVT.  He had some associated edema.  That is resolved.  He denies any worsening chest pain.  He has not had any lab work in the past couple of years.  PMHx:  Past Medical History:  Diagnosis Date  . CAD (coronary artery disease), 12/29/13 PCI/DES LCX and PCI/DES LAD with overlapping DES  12/30/2013   cath 07/05/14 [place]  . Dyslipidemia, goal LDL below 70 12/30/2013  . GERD (gastroesophageal reflux disease)   . Metabolic syndrome, HgbA1C 6.0  12/30/2013  . Sleep apnea    by history  . Smoker     Past Surgical History:  Procedure Laterality Date  . CARDIAC CATHETERIZATION  07/05/14   patent stents  . CORONARY ANGIOPLASTY WITH STENT PLACEMENT  12/29/13   DES to LCX and overlapping stents DES mid LAD  . LEFT HEART CATHETERIZATION WITH CORONARY ANGIOGRAM N/A 12/29/2013   Procedure: LEFT HEART CATHETERIZATION WITH CORONARY ANGIOGRAM;  Surgeon: [name], MD;  Location: [facility] CATH LAB;  Service: Cardiovascular;  Laterality: N/A;  . LEFT HEART CATHETERIZATION WITH CORONARY ANGIOGRAM N/A 07/05/2014   Procedure: LEFT HEART CATHETERIZATION WITH CORONARY ANGIOGRAM;  Surgeon: Peter M Swaziland, MD;  Location: [facility] CATH LAB;  Service: Cardiovascular;  Laterality: N/A;  . PERCUTANEOUS CORONARY STENT INTERVENTION (PCI-S)  12/29/2013   Procedure: PERCUTANEOUS CORONARY STENT INTERVENTION (PCI-S);  Surgeon: [name], MD;   Location: [facility] CATH LAB;  Service: Cardiovascular;;    FAMHx:  Family History  Problem Relation Age of Onset  . Heart attack Brother        Deceased  . Heart attack Brother   . Stroke Sister   . Diabetes Father        also heart disease  . Cancer Father        Deceased  . Diabetes Mother   . Cancer Mother        Deceased    SOCHx:   reports that he has been smoking cigarettes. He has a 30.00 pack-year smoking history. He has never used smokeless tobacco. He reports that he does not drink alcohol or use drugs.  ALLERGIES:  No Known Allergies  ROS: A comprehensive review of systems was negative.  HOME MEDS: Current Outpatient Medications  Medication Sig Dispense Refill  . atorvastatin (LIPITOR) 40 MG tablet Take  1 tablet (40 mg total) by mouth daily. 30 tablet 8  . carvedilol (COREG) 3.125 MG tablet Take 1 tablet (3.125 mg total) by mouth 2 (two) times daily with a meal. 60 tablet 8  . clopidogrel (PLAVIX) 75 MG tablet Take 1 tablet (75 mg total) by mouth daily. 30 tablet 7  . esomeprazole (NEXIUM 24HR) 20 MG capsule Take 20 mg by mouth daily at 12 noon.    . nitroGLYCERIN (NITROSTAT) 0.4 MG SL tablet Place 1 tablet (0.4 mg total) under the tongue every 5 (five) minutes x 3 doses as needed for chest pain. 25 tablet 4  . isosorbide mononitrate (IMDUR) 30 MG 24 hr tablet Take 1 tablet (30 mg total) by mouth daily. 30 tablet 8   No current facility-administered medications for this visit.     LABS/IMAGING: No results found for this or any previous visit (from the past 48 hour(s)). No results found.  VITALS: BP 120/78 (BP Location: Right Arm, Patient Position: Sitting, Cuff Size: Large)   Ht 6' (1.829 m)   Wt 215 lb (97.5 kg)   BMI 29.16 kg/m   EXAM: General appearance: alert and no distress Neck: no carotid bruit, no JVD and thyroid not enlarged, symmetric, no tenderness/mass/nodules Lungs: clear to auscultation bilaterally Heart: regular rate and rhythm, S1, S2  normal, no murmur, click, rub or gallop Abdomen: soft, non-tender; bowel sounds normal; no masses,  no organomegaly Extremities: extremities normal, atraumatic, no cyanosis or edema Pulses: 2+ and symmetric Skin: Skin color, texture, turgor normal. No rashes or lesions Neurologic: Grossly normal Psych: Pleasant  EKG: Sinus rhythm 65, nonspecific ST changes-personally reviewed  ASSESSMENT: 1. Coronary artery disease status post two-vessel PCI to the left circumflex and LAD (2015) 2. Dyslipidemia on statin 3. Tobacco abuse - working on quitting 4. Anxiety 5. Mildly reduced LV function with EF 50-55%  PLAN: 1.   Alan White reports he is doing well.  He denies any chest pain or worsening shortness of breath.  He is now 4 years after PCI to the LAD.  He is overdue for repeat lipid profile which we will order today as well as a comprehensive metabolic profile.  He is still a 1 pack/day smoker.  He is not been able to quit despite being on bupropion.  He has not tried Chantix but is not ready to do that at this point.  Plan follow-up with me annually or sooner as necessary.  Chrystie Nose, MD, [facility], FACP  [facility]  [facility]  Medical Director of the [facility]  [facility] Diplomate of the American Board of Clinical Lipidology Attending Cardiologist  Direct Dial: [phone]  Fax: [phone]  Website:  [email or web address]   Lisette Abu Carlicia Leavens 05/29/2018, 8:25 AM

## 2018-05-30 LAB — COMPREHENSIVE METABOLIC PANEL
ALT: 14 IU/L (ref 0–44)
AST: 12 IU/L (ref 0–40)
Albumin/Globulin Ratio: 1.6 (ref 1.2–2.2)
Albumin: 4.2 g/dL (ref 3.5–5.5)
Alkaline Phosphatase: 129 IU/L — ABNORMAL HIGH (ref 39–117)
BUN/Creatinine Ratio: 15 (ref 9–20)
BUN: 17 mg/dL (ref 6–24)
Bilirubin Total: 0.3 mg/dL (ref 0.0–1.2)
CALCIUM: 9.5 mg/dL (ref 8.7–10.2)
CO2: 25 mmol/L (ref 20–29)
CREATININE: 1.15 mg/dL (ref 0.76–1.27)
Chloride: 104 mmol/L (ref 96–106)
GFR calc Af Amer: 81 mL/min/{1.73_m2} (ref >59)
GFR calc non Af Amer: 70 mL/min/{1.73_m2} (ref >59)
GLOBULIN, TOTAL: 2.7 g/dL (ref 1.5–4.5)
GLUCOSE: 92 mg/dL (ref 65–99)
Potassium: 4.6 mmol/L (ref 3.5–5.2)
SODIUM: 143 mmol/L (ref 134–144)
Total Protein: 6.9 g/dL (ref 6.0–8.5)

## 2018-05-30 LAB — LIPID PANEL
CHOLESTEROL TOTAL: 188 mg/dL (ref 100–199)
Chol/HDL Ratio: 7 ratio — ABNORMAL HIGH (ref 0.0–5.0)
HDL: 27 mg/dL — ABNORMAL LOW (ref >39)
LDL CALC: 132 mg/dL — AB (ref 0–99)
Triglycerides: 144 mg/dL (ref 0–149)
VLDL Cholesterol Cal: 29 mg/dL (ref 5–40)

## 2018-06-05 ENCOUNTER — Telehealth: Payer: Self-pay | Admitting: Internal Medicine

## 2018-06-05 DIAGNOSIS — E785 Hyperlipidemia, unspecified: Secondary | ICD-10-CM

## 2018-06-05 NOTE — Telephone Encounter (Signed)
Follow Up:   Patient returning call concerning lab result.

## 2018-06-05 NOTE — Telephone Encounter (Signed)
Notes recorded by Chrystie Nose, MD on 05/30/2018 at 8:31 AM EDT Cholesterol is too high - was well controlled 3 years ago. I think he may have been off his meds. If so, continue lipitor for 3 more months and recheck lipid profile with consistent dosing.  Dr. Rexene Edison   Patient reports he has been taking statin "hit and miss". Advised that he take this consistently every day and Dr. Rennis Golden would like his labs rechecked in 3 months.   Lab ordered & mailed to patient.

## 2018-09-17 DIAGNOSIS — J209 Acute bronchitis, unspecified: Secondary | ICD-10-CM | POA: Diagnosis not present

## 2019-01-31 ENCOUNTER — Other Ambulatory Visit: Payer: Self-pay

## 2019-01-31 ENCOUNTER — Encounter (HOSPITAL_COMMUNITY): Payer: Self-pay

## 2019-01-31 ENCOUNTER — Inpatient Hospital Stay (HOSPITAL_COMMUNITY)
Admission: EM | Admit: 2019-01-31 | Discharge: 2019-02-02 | DRG: 250 | Disposition: A | Discharge status: Home / Self Care | Payer: BLUE CROSS/BLUE SHIELD | Attending: Cardiovascular Disease | Admitting: Cardiovascular Disease

## 2019-01-31 ENCOUNTER — Encounter (HOSPITAL_COMMUNITY)
Admission: EM | Disposition: A | Discharge status: Home / Self Care | Payer: Self-pay | Source: Home / Self Care | Attending: Cardiovascular Disease

## 2019-01-31 DIAGNOSIS — Z8249 Family history of ischemic heart disease and other diseases of the circulatory system: Secondary | ICD-10-CM | POA: Diagnosis not present

## 2019-01-31 DIAGNOSIS — Z9861 Coronary angioplasty status: Secondary | ICD-10-CM

## 2019-01-31 DIAGNOSIS — I5181 Takotsubo syndrome: Secondary | ICD-10-CM | POA: Diagnosis present

## 2019-01-31 DIAGNOSIS — I5023 Acute on chronic systolic (congestive) heart failure: Secondary | ICD-10-CM | POA: Diagnosis present

## 2019-01-31 DIAGNOSIS — E785 Hyperlipidemia, unspecified: Secondary | ICD-10-CM | POA: Diagnosis not present

## 2019-01-31 DIAGNOSIS — Z955 Presence of coronary angioplasty implant and graft: Secondary | ICD-10-CM

## 2019-01-31 DIAGNOSIS — I2102 ST elevation (STEMI) myocardial infarction involving left anterior descending coronary artery: Secondary | ICD-10-CM | POA: Diagnosis not present

## 2019-01-31 DIAGNOSIS — I255 Ischemic cardiomyopathy: Secondary | ICD-10-CM | POA: Diagnosis not present

## 2019-01-31 DIAGNOSIS — I2510 Atherosclerotic heart disease of native coronary artery without angina pectoris: Secondary | ICD-10-CM | POA: Diagnosis present

## 2019-01-31 DIAGNOSIS — Z720 Tobacco use: Secondary | ICD-10-CM | POA: Diagnosis present

## 2019-01-31 DIAGNOSIS — G4730 Sleep apnea, unspecified: Secondary | ICD-10-CM | POA: Diagnosis not present

## 2019-01-31 DIAGNOSIS — I213 ST elevation (STEMI) myocardial infarction of unspecified site: Secondary | ICD-10-CM | POA: Diagnosis not present

## 2019-01-31 DIAGNOSIS — E8881 Metabolic syndrome: Secondary | ICD-10-CM | POA: Diagnosis not present

## 2019-01-31 DIAGNOSIS — Z79899 Other long term (current) drug therapy: Secondary | ICD-10-CM | POA: Diagnosis not present

## 2019-01-31 DIAGNOSIS — I499 Cardiac arrhythmia, unspecified: Secondary | ICD-10-CM | POA: Diagnosis not present

## 2019-01-31 DIAGNOSIS — K219 Gastro-esophageal reflux disease without esophagitis: Secondary | ICD-10-CM | POA: Diagnosis not present

## 2019-01-31 DIAGNOSIS — F17210 Nicotine dependence, cigarettes, uncomplicated: Secondary | ICD-10-CM | POA: Diagnosis not present

## 2019-01-31 DIAGNOSIS — Z9114 Patient's other noncompliance with medication regimen: Secondary | ICD-10-CM | POA: Diagnosis not present

## 2019-01-31 DIAGNOSIS — R079 Chest pain, unspecified: Secondary | ICD-10-CM | POA: Diagnosis not present

## 2019-01-31 DIAGNOSIS — I249 Acute ischemic heart disease, unspecified: Secondary | ICD-10-CM | POA: Diagnosis present

## 2019-01-31 DIAGNOSIS — R9431 Abnormal electrocardiogram [ECG] [EKG]: Secondary | ICD-10-CM | POA: Diagnosis not present

## 2019-01-31 HISTORY — PX: CORONARY/GRAFT ACUTE MI REVASCULARIZATION: CATH118305

## 2019-01-31 HISTORY — PX: LEFT HEART CATH AND CORONARY ANGIOGRAPHY: CATH118249

## 2019-01-31 LAB — MRSA PCR SCREENING: MRSA by PCR: NEGATIVE

## 2019-01-31 LAB — TSH: TSH: 1.793 u[IU]/mL (ref 0.350–4.500)

## 2019-01-31 LAB — T4, FREE: Free T4: 0.94 ng/dL (ref 0.82–1.77)

## 2019-01-31 SURGERY — CORONARY/GRAFT ACUTE MI REVASCULARIZATION
Anesthesia: LOCAL

## 2019-01-31 MED ORDER — PANTOPRAZOLE SODIUM 40 MG PO TBEC
40.0000 mg | DELAYED_RELEASE_TABLET | Freq: Every day | ORAL | Status: DC
  Administered 2019-01-31 – 2019-02-02 (×3): 40 mg via ORAL
  Filled 2019-01-31 (×3): qty 1

## 2019-01-31 MED ORDER — LIDOCAINE HCL (PF) 1 % IJ SOLN
INTRAMUSCULAR | Status: AC
  Filled 2019-01-31: qty 30

## 2019-01-31 MED ORDER — TICAGRELOR 90 MG PO TABS
ORAL_TABLET | ORAL | Status: DC | PRN
  Administered 2019-01-31: 180 mg via ORAL

## 2019-01-31 MED ORDER — ACETAMINOPHEN 325 MG PO TABS: 650.0000 mg | ORAL_TABLET | ORAL | Status: DC | PRN

## 2019-01-31 MED ORDER — VERAPAMIL HCL 2.5 MG/ML IV SOLN
INTRAVENOUS | Status: AC
  Filled 2019-01-31: qty 2

## 2019-01-31 MED ORDER — TICAGRELOR 90 MG PO TABS
ORAL_TABLET | ORAL | Status: AC
  Filled 2019-01-31: qty 2

## 2019-01-31 MED ORDER — HEPARIN (PORCINE) IN NACL 1000-0.9 UT/500ML-% IV SOLN
INTRAVENOUS | Status: DC | PRN
  Administered 2019-01-31 (×3): 500 mL

## 2019-01-31 MED ORDER — NITROGLYCERIN 1 MG/10 ML FOR IR/CATH LAB
INTRA_ARTERIAL | Status: AC
  Filled 2019-01-31: qty 10

## 2019-01-31 MED ORDER — VERAPAMIL HCL 2.5 MG/ML IV SOLN
INTRAVENOUS | Status: DC | PRN
  Administered 2019-01-31: 19:00:00 10 mL via INTRA_ARTERIAL

## 2019-01-31 MED ORDER — TIROFIBAN HCL IN NACL 5-0.9 MG/100ML-% IV SOLN
INTRAVENOUS | Status: AC
  Filled 2019-01-31: qty 100

## 2019-01-31 MED ORDER — ONDANSETRON HCL 4 MG/2ML IJ SOLN: 4.0000 mg | Freq: Four times a day (QID) | INTRAMUSCULAR | Status: DC | PRN

## 2019-01-31 MED ORDER — HEPARIN (PORCINE) IN NACL 1000-0.9 UT/500ML-% IV SOLN
INTRAVENOUS | Status: AC
  Filled 2019-01-31: qty 1000

## 2019-01-31 MED ORDER — TIROFIBAN HCL IN NACL 5-0.9 MG/100ML-% IV SOLN
INTRAVENOUS | Status: AC | PRN
  Administered 2019-01-31: 0.15 ug/kg/min via INTRAVENOUS

## 2019-01-31 MED ORDER — ASPIRIN 81 MG PO CHEW: 324.0000 mg | CHEWABLE_TABLET | Freq: Once | ORAL | Status: DC

## 2019-01-31 MED ORDER — LABETALOL HCL 5 MG/ML IV SOLN: 10.0000 mg | INTRAVENOUS | Status: AC | PRN

## 2019-01-31 MED ORDER — ISOSORBIDE MONONITRATE ER 30 MG PO TB24
30.0000 mg | ORAL_TABLET | Freq: Every day | ORAL | Status: DC
  Administered 2019-02-01 – 2019-02-02 (×2): 30 mg via ORAL
  Filled 2019-01-31 (×2): qty 1

## 2019-01-31 MED ORDER — SODIUM CHLORIDE 0.9% FLUSH: 3.0000 mL | INTRAVENOUS | Status: DC | PRN

## 2019-01-31 MED ORDER — TIROFIBAN (AGGRASTAT) BOLUS VIA INFUSION
INTRAVENOUS | Status: DC | PRN
  Administered 2019-01-31: 19:00:00 2495 ug via INTRAVENOUS

## 2019-01-31 MED ORDER — HEPARIN SODIUM (PORCINE) 1000 UNIT/ML IJ SOLN
INTRAMUSCULAR | Status: AC
  Filled 2019-01-31: qty 1

## 2019-01-31 MED ORDER — NITROGLYCERIN 1 MG/10 ML FOR IR/CATH LAB
INTRA_ARTERIAL | Status: DC | PRN
  Administered 2019-01-31: 100 ug via INTRACORONARY

## 2019-01-31 MED ORDER — SODIUM CHLORIDE 0.9 % IV SOLN
250.0000 mL | INTRAVENOUS | Status: DC | PRN
  Administered 2019-02-01: 250 mL via INTRAVENOUS

## 2019-01-31 MED ORDER — HEPARIN SODIUM (PORCINE) 1000 UNIT/ML IJ SOLN
INTRAMUSCULAR | Status: DC | PRN
  Administered 2019-01-31: 10000 [IU] via INTRAVENOUS

## 2019-01-31 MED ORDER — ASPIRIN 81 MG PO CHEW
CHEWABLE_TABLET | ORAL | Status: AC
  Filled 2019-01-31: qty 4

## 2019-01-31 MED ORDER — TICAGRELOR 90 MG PO TABS
90.0000 mg | ORAL_TABLET | Freq: Two times a day (BID) | ORAL | Status: DC
  Administered 2019-02-01 – 2019-02-02 (×3): 90 mg via ORAL
  Filled 2019-01-31 (×3): qty 1

## 2019-01-31 MED ORDER — ASPIRIN EC 81 MG PO TBEC
81.0000 mg | DELAYED_RELEASE_TABLET | Freq: Every day | ORAL | Status: DC
  Administered 2019-02-01 – 2019-02-02 (×2): 81 mg via ORAL
  Filled 2019-01-31 (×2): qty 1

## 2019-01-31 MED ORDER — TIROFIBAN HCL IN NACL 5-0.9 MG/100ML-% IV SOLN
0.1500 ug/kg/min | INTRAVENOUS | Status: AC
  Administered 2019-01-31 – 2019-02-01 (×3): 0.15 ug/kg/min via INTRAVENOUS
  Filled 2019-01-31 (×3): qty 100

## 2019-01-31 MED ORDER — NITROGLYCERIN 0.4 MG SL SUBL: 0.4000 mg | SUBLINGUAL_TABLET | SUBLINGUAL | Status: DC | PRN

## 2019-01-31 MED ORDER — LIDOCAINE HCL (PF) 1 % IJ SOLN
INTRAMUSCULAR | Status: DC | PRN
  Administered 2019-01-31: 2 mL

## 2019-01-31 MED ORDER — MIDAZOLAM HCL 2 MG/2ML IJ SOLN
INTRAMUSCULAR | Status: DC | PRN
  Administered 2019-01-31: 2 mg via INTRAVENOUS

## 2019-01-31 MED ORDER — ATORVASTATIN CALCIUM 40 MG PO TABS
40.0000 mg | ORAL_TABLET | Freq: Every day | ORAL | Status: DC
  Administered 2019-01-31 – 2019-02-02 (×3): 40 mg via ORAL
  Filled 2019-01-31 (×3): qty 1

## 2019-01-31 MED ORDER — FENTANYL CITRATE (PF) 100 MCG/2ML IJ SOLN
INTRAMUSCULAR | Status: AC
  Filled 2019-01-31: qty 2

## 2019-01-31 MED ORDER — SODIUM CHLORIDE 0.9% FLUSH
3.0000 mL | Freq: Two times a day (BID) | INTRAVENOUS | Status: DC
  Administered 2019-01-31 – 2019-02-02 (×3): 3 mL via INTRAVENOUS

## 2019-01-31 MED ORDER — MIDAZOLAM HCL 2 MG/2ML IJ SOLN
INTRAMUSCULAR | Status: AC
  Filled 2019-01-31: qty 2

## 2019-01-31 MED ORDER — CARVEDILOL 3.125 MG PO TABS
3.1250 mg | ORAL_TABLET | Freq: Two times a day (BID) | ORAL | Status: DC
  Administered 2019-02-01 – 2019-02-02 (×3): 3.125 mg via ORAL
  Filled 2019-01-31 (×3): qty 1

## 2019-01-31 MED ORDER — FENTANYL CITRATE (PF) 100 MCG/2ML IJ SOLN
INTRAMUSCULAR | Status: DC | PRN
  Administered 2019-01-31: 50 ug via INTRAVENOUS

## 2019-01-31 MED ORDER — SODIUM CHLORIDE 0.9 % IV SOLN
INTRAVENOUS | Status: AC
  Administered 2019-01-31: 21:00:00 via INTRAVENOUS

## 2019-01-31 MED ORDER — HYDRALAZINE HCL 20 MG/ML IJ SOLN: 10.0000 mg | INTRAMUSCULAR | Status: AC | PRN

## 2019-01-31 MED ORDER — IOHEXOL 350 MG/ML SOLN
INTRAVENOUS | Status: DC | PRN
  Administered 2019-01-31: 20:00:00 125 mL via INTRA_ARTERIAL

## 2019-01-31 SURGICAL SUPPLY — 17 items
BALLN SAPPHIRE 2.5X12 (BALLOONS) ×2
BALLN ~~LOC~~ EMERGE MR 3.25X20 (BALLOONS) ×2
BALLOON SAPPHIRE 2.5X12 (BALLOONS) ×1 IMPLANT
BALLOON ~~LOC~~ EMERGE MR 3.25X20 (BALLOONS) ×1 IMPLANT
CATH 5FR JL3.5 JR4 ANG PIG MP (CATHETERS) ×2 IMPLANT
CATH EXTRAC PRONTO 5.5F 138CM (CATHETERS) ×2 IMPLANT
CATH VISTA GUIDE 6FR XBLAD3.0 (CATHETERS) ×2 IMPLANT
DEVICE RAD COMP TR BAND LRG (VASCULAR PRODUCTS) ×2 IMPLANT
GLIDESHEATH SLEND SS 6F .021 (SHEATH) ×2 IMPLANT
GUIDEWIRE INQWIRE 1.5J.035X260 (WIRE) ×1 IMPLANT
INQWIRE 1.5J .035X260CM (WIRE) ×2
KIT ENCORE 26 ADVANTAGE (KITS) ×2 IMPLANT
KIT HEART LEFT (KITS) ×2 IMPLANT
PACK CARDIAC CATHETERIZATION (CUSTOM PROCEDURE TRAY) ×2 IMPLANT
TRANSDUCER W/STOPCOCK (MISCELLANEOUS) ×2 IMPLANT
TUBING CIL FLEX 10 FLL-RA (TUBING) ×2 IMPLANT
WIRE COUGAR XT STRL 190CM (WIRE) ×2 IMPLANT

## 2019-01-31 NOTE — H&P (Addendum)
Cardiology Consult    Patient ID: Alan White MRN: [id number], DOB/AGE: 60-24-1962   Admit date: 01/31/2019 Date of Consult: 01/31/2019  Primary Physician: Joycelyn Rua, MD Primary Cardiologist: H[place]  History of Present Illness   58 y o man with FH, CAD, s/p PCI to LAD 2013 and 2015. Saw Dr Rennis Golden in Fall. Came off plavix as decided with Dr. Rennis Golden. He also stopped ALL other meds. Says he felt just as good on the meds as off the meds. He developed CP around 2.5 h prior to presentation to [facility]. He went to the fire station with chest pain and left arm pain. No SOB, PND, orthopnea. No recent pain episodes. Lives with wife.   Received [address] , morphine and nitro with EMS. This helped his pain but still 5/10. ECG with ST elevation anterolateral. STEMI activated.   Inpatient Medications    . aspirin      . [MAR Hold] aspirin  324 mg Oral Once  . [START ON 02/01/2019] aspirin EC  81 mg Oral Daily  . atorvastatin  40 mg Oral Daily  . [START ON 02/01/2019] carvedilol  3.125 mg Oral BID WC  . isosorbide mononitrate  30 mg Oral Daily  . pantoprazole  40 mg Oral Daily    Family History    Family History  Problem Relation Age of Onset  . Heart attack Brother        Deceased  . Heart attack Brother   . Stroke Sister   . Diabetes Father        also heart disease  . Cancer Father        Deceased  . Diabetes Mother   . Cancer Mother        Deceased   He indicated that the status of his mother is unknown. He indicated that the status of his father is unknown. He indicated that the status of his sister is unknown. He indicated that only one of his four brothers is alive.   Social History    Social History   Socioeconomic History  . Marital status: Married    Spouse name:    . Number of children: Not on file  . Years of education: Not on file  . Highest education level: Not on file  Occupational History  . Occupation: Event organiser: [employer]  Social Needs  .  Financial resource strain: Not on file  . Food insecurity:    Worry: Not on file    Inability: Not on file  . Transportation needs:    Medical: Not on file    Non-medical: Not on file  Tobacco Use  . Smoking status: Current Every Day Smoker    Packs/day: 1.00    Years: 30.00    Pack years: 30.00    Types: Cigarettes  . Smokeless tobacco: Never Used  Substance and Sexual Activity  . Alcohol use: No    Comment: "Once in a blue moon"  . Drug use: No  . Sexual activity: Not on file  Lifestyle  . Physical activity:    Days per week: Not on file    Minutes per session: Not on file  . Stress: Not on file  Relationships  . Social connections:    Talks on phone: Not on file    Gets together: Not on file    Attends religious service: Not on file    Active member of club or organization: Not on file  Attends meetings of clubs or organizations: Not on file    Relationship status: Not on file  . Intimate partner violence:    Fear of current or ex partner: Not on file    Emotionally abused: Not on file    Physically abused: Not on file    Forced sexual activity: Not on file  Other Topics Concern  . Not on file  Social History Narrative   Pt lives with wife.     Review of Systems    General:  No chills, fever, night sweats or weight changes.  Cardiovascular:  No chest pain, dyspnea on exertion, edema, orthopnea, palpitations, paroxysmal nocturnal dyspnea. Dermatological: No rash, lesions/masses Respiratory: No cough, dyspnea Urologic: No hematuria, dysuria Abdominal:   No nausea, vomiting, diarrhea, bright red blood per rectum, melena, or hematemesis Neurologic:  No visual changes, wkns, changes in mental status. All other systems reviewed and are otherwise negative except as noted above.  Physical Exam    There were no vitals taken for this visit.  General: in pain Psych: Normal affect. Neuro: Alert and oriented X 3. Moves all extremities spontaneously. HEENT: Normal   Neck: Supple without bruits or JVD. Lungs:  Resp regular and unlabored, CTA. Heart: RRR no s3, s4, or murmurs. Abdomen: Soft, non-tender, non-distended, BS + x 4.  Extremities: No clubbing, cyanosis or edema. DP/PT/Radials 2+ and equal bilaterally.  Labs    Troponin (Point of Care Test) No results for input(s): TROPIPOC in the last 72 hours. No results for input(s): CKTOTAL, CKMB, TROPONINI in the last 72 hours. Lab Results  Component Value Date   WBC 9.3 07/06/2014   HGB 14.6 02/04/2015   HCT 43.0 02/04/2015   MCV 89.7 07/06/2014   PLT 210 07/06/2014   No results for input(s): NA, K, CL, CO2, BUN, CREATININE, CALCIUM, PROT, BILITOT, ALKPHOS, ALT, AST, GLUCOSE in the last 168 hours.  Invalid input(s): LABALBU Lab Results  Component Value Date   CHOL 188 05/29/2018   HDL 27 (L) 05/29/2018   LDLCALC 132 (H) 05/29/2018   TRIG 144 05/29/2018   No results found for: [facility]   Radiology Studies    No results found.  ECG & Cardiac Imaging    NSR, STEMI, antero-lateral  Assessment & Plan    STEMI, FH. Known CAD. IN acute pain. HDS.  Plan: - Proceed to cath lab for LHC now - Will resume his meds he stopped prior. ASA, Atorva, carvedilol - TTE in am  Signed, Alan Golds, MD 01/31/2019, 6:57 PM  For questions or updates, please contact   Please consult [email or web address] for contact info under Cardiology/STEMI.   I have personally seen and examined this patient with DR. Fudim. I agree with the assessment and plan as outlined above. Alan White presented with an acute anterior STEMI. EKG with ST elevation in the anterior leads. I personally reviewed the EKG in the ED. Code STEMI activated by EMS. Ongoing pain in the ED.  My exam:  General: Well developed, well nourished, ill appearing HEENT: OP clear, mucus membranes moist  SKIN: warm, dry. No rashes. Neuro: No focal deficits  Musculoskeletal: Muscle strength 5/5 all ext  Psychiatric: Mood and affect normal  Neck: No JVD, no  carotid bruits, no thyromegaly, no lymphadenopathy.  Lungs:Clear bilaterally, no wheezes, rhonci, crackles Cardiovascular: Regular rate and rhythm. No murmurs, gallops or rubs. Abdomen:Soft. Bowel sounds present. Non-tender.  Extremities: No lower extremity edema. Pulses are 2 + in the bilateral DP/PT.  Plan: Acute anterior STEMI: Cath  with occluded mid LAD within the old stents that were placed in 2013. Angioplasty only of the mid LAD stents with restoration of flow down the LAD. Large thrombus shift to the distal LAD and removed with aspiration catheter. Patent Circumflex stent and minimal RCA disease. LVEDP elevated so no LV gram. Echo in am.  Aggrastat for 18 hours given thrombus burden. DAPT with ASA and Brilinta for one year then DAPT for lifetime with ASA and Plavix given stent thrombosis. Continue statin and beta blocker. Smoking cessation.   Verne CarrowChristopher Octavis White 01/31/2019 9:16 PM

## 2019-01-31 NOTE — ED Triage Notes (Signed)
Pt arrived code stemi ed team in the room.

## 2019-01-31 NOTE — ED Provider Notes (Signed)
[facility] CARDIOVASCULAR ICU Provider Note   CSN: [id number] Arrival date & time: 01/31/19  1837    History   Chief Complaint Chief Complaint  Patient presents with  . Code STEMI    HPI Alan White is a 58 y.o. male.     HPI  His late chart completion is the patient has already had cardiac intervention. Patient was seen as a code STEMI after developing chest pain approximately 2 hours prior to ED arrival. I evaluated the patient immediately after arrival, and concurrently discussed his case with our cardiology colleagues given concern for ongoing coronary ischemia. Patient notes that he is a history of CAD, prior stents, no longer takes his Plavix, stopping about 6 months ago. Onset was relatively severe, and since onset pain is been pressure-like, left-sided with radiation to the jaw, left arm, minimally improved with morphine, nitroglycerin. It is unclear why the patient stopped taking his medication, as he seemingly acknowledges the risk with having done so.  Discussed the patient's case with EMS providers who brought the patient, and we discussed his rhythm strip results, substantial pain on their arrival.  Past Medical History:  Diagnosis Date  . CAD (coronary artery disease), 12/29/13 PCI/DES LCX and PCI/DES LAD with overlapping DES  12/30/2013   cath 07/05/14 [place]  . Dyslipidemia, goal LDL below 70 12/30/2013  . GERD (gastroesophageal reflux disease)   . Metabolic syndrome, HgbA1C 6.0  12/30/2013  . Sleep apnea    by history  . Smoker     Patient Active Problem List   Diagnosis Date Noted  . ACS (acute coronary syndrome) (HCC) 01/31/2019  . Acute ST elevation myocardial infarction (STEMI) involving left anterior descending coronary artery (HCC) 01/31/2019  . Acute ST elevation myocardial infarction (STEMI) involving left anterior descending (LAD) coronary artery (HCC)   . Anxiety 03/04/2015  . Sleep apnea by history 07/06/2014  . Chest pain with moderate risk of  acute coronary syndrome 07/03/2014  . Family history of coronary artery disease 07/03/2014  . CAD- 12/29/13 PCI/DES CFX and LAD- patent 07/05/14 12/30/2013  . Abnormal EKG 12/30/2013  . LV dysfunction, EF by cath 50-55%, by Echo 45-50% 12/30/2013  . Hyperlipidemia 12/30/2013  . Metabolic syndrome, HgbA1C 6.0  12/30/2013  . Tobacco use 12/28/2013    Past Surgical History:  Procedure Laterality Date  . CARDIAC CATHETERIZATION  07/05/14   patent stents  . CORONARY ANGIOPLASTY WITH STENT PLACEMENT  12/29/13   DES to LCX and overlapping stents DES mid LAD  . LEFT HEART CATHETERIZATION WITH CORONARY ANGIOGRAM N/A 12/29/2013   Procedure: LEFT HEART CATHETERIZATION WITH CORONARY ANGIOGRAM;  Surgeon: [name], MD;  Location: [facility] CATH LAB;  Service: Cardiovascular;  Laterality: N/A;  . LEFT HEART CATHETERIZATION WITH CORONARY ANGIOGRAM N/A 07/05/2014   Procedure: LEFT HEART CATHETERIZATION WITH CORONARY ANGIOGRAM;  Surgeon: Peter M Swaziland, MD;  Location: [facility] CATH LAB;  Service: Cardiovascular;  Laterality: N/A;  . PERCUTANEOUS CORONARY STENT INTERVENTION (PCI-S)  12/29/2013   Procedure: PERCUTANEOUS CORONARY STENT INTERVENTION (PCI-S);  Surgeon: [name], MD;  Location: [facility] CATH LAB;  Service: Cardiovascular;;        Home Medications    Prior to Admission medications   Medication Sig Start Date End Date Taking? Authorizing Provider  atorvastatin (LIPITOR) 40 MG tablet Take 1 tablet (40 mg total) by mouth daily. Patient not taking: Reported on 01/31/2019 11/18/17   Azalee Course, PA  carvedilol (COREG) 3.125 MG tablet Take 1 tablet (3.125 mg  total) by mouth 2 (two) times daily with a meal. Patient not taking: Reported on 01/31/2019 11/18/17   Azalee Course, PA  clopidogrel (PLAVIX) 75 MG tablet Take 1 tablet (75 mg total) by mouth daily. Patient not taking: Reported on 01/31/2019 11/18/17   Azalee Course, PA  esomeprazole (NEXIUM 24HR) 20 MG capsule Take 20 mg by mouth daily at 12 noon.    [provider]  isosorbide mononitrate (IMDUR) 30 MG 24 hr tablet Take 1 tablet (30 mg total) by mouth daily. 11/18/17 02/16/18  Azalee Course, PA  nitroGLYCERIN (NITROSTAT) 0.4 MG SL tablet Place 1 tablet (0.4 mg total) under the tongue every 5 (five) minutes x 3 doses as needed for chest pain. Patient not taking: Reported on 01/31/2019 12/30/13   Leone Brand, NP    Family History Family History  Problem Relation Age of Onset  . Heart attack Brother        Deceased  . Heart attack Brother   . Stroke Sister   . Diabetes Father        also heart disease  . Cancer Father        Deceased  . Diabetes Mother   . Cancer Mother        Deceased    Social History Social History   Tobacco Use  . Smoking status: Current Every Day Smoker    Packs/day: 1.00    Years: 30.00    Pack years: 30.00    Types: Cigarettes  . Smokeless tobacco: Never Used  Substance Use Topics  . Alcohol use: No    Comment: "Once in a blue moon"  . Drug use: No     Allergies   Patient has no known allergies.   Review of Systems Review of Systems  Constitutional:       Per HPI, otherwise negative  HENT:       Per HPI, otherwise negative  Respiratory:       Per HPI, otherwise negative  Cardiovascular:       Per HPI, otherwise negative  Gastrointestinal: Negative for vomiting.  Endocrine:       Negative aside from HPI  Genitourinary:       Neg aside from HPI   Musculoskeletal:       Per HPI, otherwise negative  Skin: Negative.   Neurological: Negative for syncope.     Physical Exam Updated Vital Signs BP 113/82   Pulse 85   Temp 98.1 F (36.7 C) (Oral)   Resp 17   Ht 6' 0.01" (1.829 m)   Wt 97.3 kg   SpO2 96%   BMI 29.09 kg/m   Physical Exam Vitals signs and nursing note reviewed.  Constitutional:      General: He is in acute distress.     Appearance: He is well-developed. He is ill-appearing and diaphoretic.  HENT:     Head: Normocephalic and atraumatic.  Eyes:      Conjunctiva/sclera: Conjunctivae normal.  Cardiovascular:     Rate and Rhythm: Normal rate and regular rhythm.  Pulmonary:     Effort: Pulmonary effort is normal. No respiratory distress.     Breath sounds: No stridor.  Abdominal:     General: There is no distension.  Skin:    General: Skin is warm.  Neurological:     Mental Status: He is alert and oriented to person, place, and time.      ED Treatments / Results  Labs (all labs ordered are listed, but  only abnormal results are displayed) Labs Reviewed  MRSA PCR SCREENING  TSH  T4, FREE  BASIC METABOLIC PANEL  CBC  TROPONIN I  TROPONIN I  TROPONIN I    EKG EKG Interpretation  Date/Time:  Saturday Jan 31 2019 18:42:32 EDT Ventricular Rate:  91 PR Interval:    QRS Duration: 100 QT Interval:  375 QTC Calculation: 462 R Axis:   -36 Text Interpretation:  Sinus tachycardia Ventricular bigeminy Left axis deviation Anterolateral infarct, acute (LAD) >>> Acute MI <<< Confirmed by Gerhard MunchLockwood, Dicy Smigel [phone]([id number]) on 01/31/2019 11:54:36 PM   Procedures Procedures (including critical care time)  Medications Ordered in ED Medications  aspirin 81 MG chewable tablet (  Canceled Entry 01/31/19 2051)  atorvastatin (LIPITOR) tablet 40 mg (40 mg Oral Given 01/31/19 2050)  carvedilol (COREG) tablet 3.125 mg (has no administration in time range)  isosorbide mononitrate (IMDUR) 24 hr tablet 30 mg (has no administration in time range)  pantoprazole (PROTONIX) EC tablet 40 mg (40 mg Oral Given 01/31/19 2050)  aspirin EC tablet 81 mg (has no administration in time range)  nitroGLYCERIN (NITROSTAT) SL tablet 0.4 mg (has no administration in time range)  ondansetron (ZOFRAN) injection 4 mg (has no administration in time range)  labetalol (NORMODYNE) injection 10 mg (has no administration in time range)  hydrALAZINE (APRESOLINE) injection 10 mg (has no administration in time range)  acetaminophen (TYLENOL) tablet 650 mg (has no administration in time  range)  ondansetron (ZOFRAN) injection 4 mg (has no administration in time range)  0.9 %  sodium chloride infusion ( Intravenous Rate/Dose Verify 01/31/19 2100)  sodium chloride flush (NS) 0.9 % injection 3 mL (3 mLs Intravenous Given 01/31/19 2304)  sodium chloride flush (NS) 0.9 % injection 3 mL (has no administration in time range)  0.9 %  sodium chloride infusion (has no administration in time range)  ticagrelor (BRILINTA) tablet 90 mg (has no administration in time range)  tirofiban (AGGRASTAT) infusion 50 mcg/mL 100 mL (0.15 mcg/kg/min  99.8 kg Intravenous Rate/Dose Verify 01/31/19 2100)  tirofiban (AGGRASTAT) infusion 50 mcg/mL 100 mL (0.15 mcg/kg/min  99.8 kg Intravenous New Bag/Given 01/31/19 1918)     Initial Impression / Assessment and Plan / ED Course  I have reviewed the triage vital signs and the nursing notes.  Pertinent labs & imaging results that were available during my care of the patient were reviewed by me and considered in my medical decision making (see chart for details).  Immediately after arrival, as above, I discussed the patient's case with her cardiology colleagues, and pharmacist as well. With consideration of acute S TE MRI, the patient received heparin bolus. Patient was placed on continuous cardiac monitoring, including defibrillation pads. Cardiac catheterization lab was made aware of the patient's presentation and he was sent for emergent intervention.  On chart review is clear that the patient had successful intervention, with results as below: 1. Acute anterior STEMI secondary to thrombotic occlusion of the stented segment of the mid LAD  2. Successful PTCA with balloon angioplasty only of the mid LAD stented segment with excellent angiographic result 3. Patent stent mid Circumflex 4. Mild non-obstructive disease in the proximal LAD and proximal RCA 5. Elevated LVEDP  This patient with a history of CAD, medication noncompliance presents with acute chest  pain is found to have EKG findings consistent with an physical exam signs concerning for acute ischemic event. Patient received heparin bolus in the emergency department, went to the catheterization lab as above, and was admitted  to the ICU for further monitoring, management. Final Clinical Impressions(s) / ED Diagnoses  Acute myocardial infarction.   CRITICAL CARE Performed by: Gerhard Munch Total critical care time: 35 minutes Critical care time was exclusive of separately billable procedures and treating other patients. Critical care was necessary to treat or prevent imminent or life-threatening deterioration. Critical care was time spent personally by me on the following activities: development of treatment plan with patient and/or surrogate as well as nursing, discussions with consultants, evaluation of patient's response to treatment, examination of patient, obtaining history from patient or surrogate, ordering and performing treatments and interventions, ordering and review of laboratory studies, ordering and review of radiographic studies, pulse oximetry and re-evaluation of patient's condition.    Gerhard Munch, MD 01/31/19 [phone]

## 2019-02-01 ENCOUNTER — Inpatient Hospital Stay (HOSPITAL_COMMUNITY): Payer: BLUE CROSS/BLUE SHIELD

## 2019-02-01 DIAGNOSIS — R9431 Abnormal electrocardiogram [ECG] [EKG]: Secondary | ICD-10-CM

## 2019-02-01 LAB — POCT I-STAT 7, (LYTES, BLD GAS, ICA,H+H)
Acid-base deficit: 12 mmol/L — ABNORMAL HIGH (ref 0.0–2.0)
Bicarbonate: 16.5 mmol/L — ABNORMAL LOW (ref 20.0–28.0)
Calcium, Ion: 1.03 mmol/L — ABNORMAL LOW (ref 1.15–1.40)
HCT: 45 % (ref 39.0–52.0)
Hemoglobin: 15.3 g/dL (ref 13.0–17.0)
O2 Saturation: 81 %
Potassium: 2.8 mmol/L — ABNORMAL LOW (ref 3.5–5.1)
Sodium: 111 mmol/L — CL (ref 135–145)
TCO2: 18 mmol/L — ABNORMAL LOW (ref 22–32)
pCO2 arterial: 47.6 mmHg (ref 32.0–48.0)
pH, Arterial: 7.149 — CL (ref 7.350–7.450)
pO2, Arterial: 58 mmHg — ABNORMAL LOW (ref 83.0–108.0)

## 2019-02-01 LAB — CBC WITH DIFFERENTIAL/PLATELET
Abs Immature Granulocytes: 0.26 10*3/uL — ABNORMAL HIGH (ref 0.00–0.07)
Basophils Absolute: 0.1 10*3/uL (ref 0.0–0.1)
Basophils Relative: 0 %
Eosinophils Absolute: 0.1 10*3/uL (ref 0.0–0.5)
Eosinophils Relative: 0 %
HCT: 47.8 % (ref 39.0–52.0)
Hemoglobin: 15.6 g/dL (ref 13.0–17.0)
Immature Granulocytes: 1 %
Lymphocytes Relative: 15 %
Lymphs Abs: 2.9 10*3/uL (ref 0.7–4.0)
MCH: 31.6 pg (ref 26.0–34.0)
MCHC: 32.6 g/dL (ref 30.0–36.0)
MCV: 97 fL (ref 80.0–100.0)
Monocytes Absolute: 1.1 10*3/uL — ABNORMAL HIGH (ref 0.1–1.0)
Monocytes Relative: 6 %
Neutro Abs: 14.7 10*3/uL — ABNORMAL HIGH (ref 1.7–7.7)
Neutrophils Relative %: 78 %
Platelets: 238 10*3/uL (ref 150–400)
RBC: 4.93 MIL/uL (ref 4.22–5.81)
RDW: 14.6 % (ref 11.5–15.5)
WBC: 19 10*3/uL — ABNORMAL HIGH (ref 4.0–10.5)
nRBC: 0 % (ref 0.0–0.2)

## 2019-02-01 LAB — CBC
HCT: 43.5 % (ref 39.0–52.0)
Hemoglobin: 14.6 g/dL (ref 13.0–17.0)
MCH: 31 pg (ref 26.0–34.0)
MCHC: 33.6 g/dL (ref 30.0–36.0)
MCV: 92.4 fL (ref 80.0–100.0)
Platelets: 227 10*3/uL (ref 150–400)
RBC: 4.71 MIL/uL (ref 4.22–5.81)
RDW: 14.5 % (ref 11.5–15.5)
WBC: 13.5 10*3/uL — ABNORMAL HIGH (ref 4.0–10.5)
nRBC: 0 % (ref 0.0–0.2)

## 2019-02-01 LAB — BASIC METABOLIC PANEL
Anion gap: 12 (ref 5–15)
Anion gap: 9 (ref 5–15)
BUN: 16 mg/dL (ref 6–20)
BUN: 18 mg/dL (ref 6–20)
CO2: 21 mmol/L — ABNORMAL LOW (ref 22–32)
CO2: 23 mmol/L (ref 22–32)
Calcium: 8.9 mg/dL (ref 8.9–10.3)
Calcium: 8.9 mg/dL (ref 8.9–10.3)
Chloride: 106 mmol/L (ref 98–111)
Chloride: 108 mmol/L (ref 98–111)
Creatinine, Ser: 1.2 mg/dL (ref 0.61–1.24)
Creatinine, Ser: 1.1 mg/dL (ref 0.61–1.24)
GFR calc Af Amer: >60 mL/min (ref >60)
GFR calc Af Amer: >60 mL/min (ref >60)
GFR calc non Af Amer: >60 mL/min (ref >60)
GFR calc non Af Amer: >60 mL/min (ref >60)
Glucose, Bld: 133 mg/dL — ABNORMAL HIGH (ref 70–99)
Glucose, Bld: 119 mg/dL — ABNORMAL HIGH (ref 70–99)
Potassium: 4.1 mmol/L (ref 3.5–5.1)
Potassium: 4 mmol/L (ref 3.5–5.1)
Sodium: 139 mmol/L (ref 135–145)
Sodium: 140 mmol/L (ref 135–145)

## 2019-02-01 LAB — POCT ACTIVATED CLOTTING TIME: Activated Clotting Time: 747 seconds

## 2019-02-01 LAB — MAGNESIUM: Magnesium: 1.8 mg/dL (ref 1.7–2.4)

## 2019-02-01 LAB — TROPONIN I
Troponin I: >65 ng/mL (ref <0.03)
Troponin I: >65 ng/mL (ref <0.03)
Troponin I: >65 ng/mL (ref <0.03)

## 2019-02-01 LAB — ECHOCARDIOGRAM COMPLETE
Height: 72.008 in
Weight: 3432.12 oz

## 2019-02-01 LAB — POCT I-STAT CREATININE: Creatinine, Ser: 0.6 mg/dL — ABNORMAL LOW (ref 0.61–1.24)

## 2019-02-01 MED ORDER — LOSARTAN POTASSIUM 25 MG PO TABS
25.0000 mg | ORAL_TABLET | Freq: Every day | ORAL | Status: DC
  Filled 2019-02-01: qty 1

## 2019-02-01 MED ORDER — MAGNESIUM SULFATE 2 GM/50ML IV SOLN
2.0000 g | Freq: Once | INTRAVENOUS | Status: AC
  Administered 2019-02-01: 2 g via INTRAVENOUS
  Filled 2019-02-01: qty 50

## 2019-02-01 MED ORDER — NICOTINE 14 MG/24HR TD PT24
14.0000 mg | MEDICATED_PATCH | Freq: Every day | TRANSDERMAL | Status: DC
  Administered 2019-02-01 – 2019-02-02 (×2): 14 mg via TRANSDERMAL
  Filled 2019-02-01 (×2): qty 1

## 2019-02-01 MED ORDER — SPIRONOLACTONE 12.5 MG HALF TABLET
12.5000 mg | ORAL_TABLET | Freq: Every day | ORAL | Status: DC
  Administered 2019-02-01 – 2019-02-02 (×2): 12.5 mg via ORAL
  Filled 2019-02-01 (×2): qty 1

## 2019-02-01 NOTE — Progress Notes (Signed)
  Echocardiogram 2D Echocardiogram has been performed.  Alan White 02/01/2019, 12:53 PM

## 2019-02-01 NOTE — Progress Notes (Signed)
Cardiology Rounding Note   Subjective:    Admitted 5/2 with anterior STEMI. Found to have thrombotic occlusion of the stented segment of the mid LAD in setting of noncompliance with meds -> underwent POBA  Feels fine this am. No CP or SOB. Trop remains > 65  ECG with anterior Qs   Objective:   Weight Range:  Vital Signs:   Temp:  [97.7 F (36.5 C)-98.2 F (36.8 C)] 97.7 F (36.5 C) (05/03 0908) Pulse Rate:  [67-141] 68 (05/03 0900) Resp:  [12-26] 18 (05/03 0900) BP: (99-131)/(71-100) 123/85 (05/03 0900) SpO2:  [92 %-99 %] 99 % (05/03 0900) Weight:  [97.3 kg-99.8 kg] 97.3 kg (05/02 2034)    Weight change: Filed Weights   01/31/19 1903 01/31/19 2034  Weight: 99.8 kg 97.3 kg    Intake/Output:   Intake/Output Summary (Last 24 hours) at 02/01/2019 1039 Last data filed at 02/01/2019 0900 Gross per 24 hour  Intake 951.28 ml  Output 550 ml  Net 401.28 ml     Physical Exam: General:  Well appearing. No resp difficulty HEENT: normal Neck: supple. JVP . Carotids 2+ bilat; no bruits. No lymphadenopathy or thryomegaly appreciated. Cor: PMI nondisplaced. Regular rate & rhythm. No rubs, gallops or murmurs. Lungs: clear Abdomen: soft, nontender, nondistended. No hepatosplenomegaly. No bruits or masses. Good bowel sounds. Extremities: no cyanosis, clubbing, rash, edema Neuro: alert & orientedx3, cranial nerves grossly intact. moves all 4 extremities w/o difficulty. Affect pleasant  Telemetry: NSR 70s Personally reviewed   Labs: Basic Metabolic Panel: Recent Labs  Lab 01/31/19 2237 02/01/19 0527  NA 139 140  K 4.1 4.0  CL 106 108  CO2 21* 23  GLUCOSE 133* 119*  BUN 16 18  CREATININE 1.20 1.10  CALCIUM 8.9 8.9    Liver Function Tests: No results for input(s): AST, ALT, ALKPHOS, BILITOT, PROT, ALBUMIN in the last 168 hours. No results for input(s): LIPASE, AMYLASE in the last 168 hours. No results for input(s): AMMONIA in the last 168 hours.  CBC: Recent  Labs  Lab 01/31/19 2237 02/01/19 0527  WBC 19.0* 13.5*  NEUTROABS 14.7*  --   HGB 15.6 14.6  HCT 47.8 43.5  MCV 97.0 92.4  PLT 238 227    Cardiac Enzymes: Recent Labs  Lab 01/31/19 2237 02/01/19 0527  TROPONINI >65.00* >65.00*    BNP: BNP (last 3 results) No results for input(s): BNP in the last 8760 hours.  ProBNP (last 3 results) No results for input(s): PROBNP in the last 8760 hours.    Other results:  Imaging:  No results found.   Medications:     Scheduled Medications: . aspirin EC  81 mg Oral Daily  . atorvastatin  40 mg Oral Daily  . carvedilol  3.125 mg Oral BID WC  . isosorbide mononitrate  30 mg Oral Daily  . pantoprazole  40 mg Oral Daily  . sodium chloride flush  3 mL Intravenous Q12H  . ticagrelor  90 mg Oral BID     Infusions: . sodium chloride 10 mL/hr at 02/01/19 0600  . tirofiban 0.15 mcg/kg/min (02/01/19 0843)     PRN Medications:  sodium chloride, acetaminophen, nitroGLYCERIN, ondansetron (ZOFRAN) IV, ondansetron (ZOFRAN) IV, sodium chloride flush   Assessment:   58 y/o male with h/o CAD s/p previous stents to LAD and LCX. Admitted 5/2 with anterior STEMI. Found to have thrombotic occlusion of the stented segment of the mid LAD in setting of noncompliance with meds -> underwent POBA  Plan/Discussion:  1. CAD with acute anterior MI - Admitted 5/2 with anterior STEMI. Found to have thrombotic occlusion of the stented segment of the mid LAD in setting of noncompliance with meds -> underwent POBA. Other CAD stable.  - peak trop > 65 - EF 45-50% by echo 3/15. Will repeat. Based on ECG and trops suspect he will have significant LV dysfunction.  - Continue DAPT (lifelong), statin and b-blocker (increase atorva to 80) - CR consult (virtual) - Can go to floor - Likely home in am   2. Ischemic CM - echo pending - add spiro 12.5 and losartan 25  3. Tobacco abuse - need for cessation discussed - start nicotine patch    Length of Stay: 1   Arvilla Meres MD 02/01/2019, 10:39 AM  Advanced Heart Failure Team Pager [phone] (M-F; 7a - 4p)  Please contact [facility] for night-coverage after hours (4p -7a ) and weekends on [email or web address]

## 2019-02-02 ENCOUNTER — Telehealth: Payer: Self-pay | Admitting: Internal Medicine

## 2019-02-02 ENCOUNTER — Telehealth: Payer: Self-pay | Admitting: Physician Assistant

## 2019-02-02 ENCOUNTER — Encounter (HOSPITAL_COMMUNITY): Payer: Self-pay | Admitting: *Deleted

## 2019-02-02 ENCOUNTER — Other Ambulatory Visit: Payer: Self-pay | Admitting: Physician Assistant

## 2019-02-02 DIAGNOSIS — I255 Ischemic cardiomyopathy: Secondary | ICD-10-CM

## 2019-02-02 DIAGNOSIS — N179 Acute kidney failure, unspecified: Secondary | ICD-10-CM

## 2019-02-02 LAB — CBC
HCT: 39.1 % (ref 39.0–52.0)
Hemoglobin: 13.3 g/dL (ref 13.0–17.0)
MCH: 31.9 pg (ref 26.0–34.0)
MCHC: 34 g/dL (ref 30.0–36.0)
MCV: 93.8 fL (ref 80.0–100.0)
Platelets: 171 10*3/uL (ref 150–400)
RBC: 4.17 MIL/uL — ABNORMAL LOW (ref 4.22–5.81)
RDW: 14.6 % (ref 11.5–15.5)
WBC: 12 10*3/uL — ABNORMAL HIGH (ref 4.0–10.5)
nRBC: 0 % (ref 0.0–0.2)

## 2019-02-02 LAB — BASIC METABOLIC PANEL WITH GFR
Anion gap: 10 (ref 5–15)
BUN: 17 mg/dL (ref 6–20)
CO2: 22 mmol/L (ref 22–32)
Calcium: 8.5 mg/dL — ABNORMAL LOW (ref 8.9–10.3)
Chloride: 108 mmol/L (ref 98–111)
Creatinine, Ser: 1.26 mg/dL — ABNORMAL HIGH (ref 0.61–1.24)
GFR calc Af Amer: >60 mL/min
GFR calc non Af Amer: >60 mL/min
Glucose, Bld: 110 mg/dL — ABNORMAL HIGH (ref 70–99)
Potassium: 3.9 mmol/L (ref 3.5–5.1)
Sodium: 140 mmol/L (ref 135–145)

## 2019-02-02 MED ORDER — PANTOPRAZOLE SODIUM 40 MG PO TBEC: 40.0000 mg | DELAYED_RELEASE_TABLET | Freq: Every day | ORAL | 2 refills | Status: DC

## 2019-02-02 MED ORDER — ISOSORBIDE MONONITRATE ER 30 MG PO TB24: 30.0000 mg | ORAL_TABLET | Freq: Every day | ORAL | 6 refills | Status: DC

## 2019-02-02 MED ORDER — LOSARTAN POTASSIUM 25 MG PO TABS: 25.0000 mg | ORAL_TABLET | Freq: Every day | ORAL | 6 refills | Status: DC

## 2019-02-02 MED ORDER — SPIRONOLACTONE 25 MG PO TABS: 12.5000 mg | ORAL_TABLET | Freq: Every day | ORAL | 6 refills | Status: DC

## 2019-02-02 MED ORDER — ATORVASTATIN CALCIUM 80 MG PO TABS: 80.0000 mg | ORAL_TABLET | Freq: Every day | ORAL | 6 refills | Status: DC

## 2019-02-02 MED ORDER — TICAGRELOR 90 MG PO TABS: 90.0000 mg | ORAL_TABLET | Freq: Two times a day (BID) | ORAL | 11 refills | Status: DC

## 2019-02-02 MED ORDER — ASPIRIN 81 MG PO TBEC: 81.0000 mg | DELAYED_RELEASE_TABLET | Freq: Every day | ORAL | 11 refills | Status: DC

## 2019-02-02 MED ORDER — NITROGLYCERIN 0.4 MG SL SUBL: 0.4000 mg | SUBLINGUAL_TABLET | SUBLINGUAL | 12 refills | Status: DC | PRN

## 2019-02-02 MED ORDER — NICOTINE 14 MG/24HR TD PT24: 14.0000 mg | MEDICATED_PATCH | Freq: Every day | TRANSDERMAL | 0 refills | Status: DC

## 2019-02-02 MED ORDER — CARVEDILOL 3.125 MG PO TABS: 3.1250 mg | ORAL_TABLET | Freq: Two times a day (BID) | ORAL | 8 refills | Status: DC

## 2019-02-02 MED FILL — BRILINTA 90 MG TABLET: 90 | 30 days supply | Qty: 60 | Fill #0 | Status: TO

## 2019-02-02 MED FILL — CARVEDILOL 3.125 MG TABLET: 3.125 | 30 days supply | Qty: 60 | Fill #0 | Status: TO

## 2019-02-02 MED FILL — NITROGLYCERIN 0.4 MG TAB SL: 0.4 | 8 days supply | Qty: 25 | Fill #0 | Status: TO

## 2019-02-02 MED FILL — LOSARTAN POTASSIUM 25 MG TA: 25 | 30 days supply | Qty: 30 | Fill #0 | Status: TO

## 2019-02-02 MED FILL — ISOSORBIDE MN ER 30 MG TAB: 30 | 30 days supply | Qty: 30 | Fill #0 | Status: TO

## 2019-02-02 MED FILL — PANTOPRAZOLE SOD DR 40 MG T: 40 | 30 days supply | Qty: 30 | Fill #0 | Status: TO

## 2019-02-02 MED FILL — ATORVASTATIN CALCIUM 80 MG: 80 | 30 days supply | Qty: 30 | Fill #0 | Status: TO

## 2019-02-02 MED FILL — ASPIRIN LOW DOSE 81 MG TBEC: 81 | 30 days supply | Qty: 30 | Fill #0 | Status: TO

## 2019-02-02 MED FILL — SPIRONOLACTONE 25 MG TABLET: 25 | 30 days supply | Qty: 15 | Fill #0 | Status: TO

## 2019-02-02 NOTE — Progress Notes (Signed)
Cardiac Rehab Advisory Cardiac Rehab Phase I is not seeing pts face to face at this time due to Covid 19 restrictions. Ambulation is occurring through nursing, PT, and mobility teams. We will help facilitate that process as needed. We are calling pts in their rooms and discussing education. We will then deliver education materials to pts RN for delivery to pt.   [phone] Called pt to complete MI and CHF ed. Reviewed MI restrictions, NTG use (he needs new prescription), smoking cessation, ex ed which is walking, CRP 2, low sodium and heart healthy food choices.  Discussed with pt that his pumping is low (EF is 35-40%) and that he needs to weigh daily. Discussed signs/symptoms of CHF and when to call MD. Discussed keeping sodium to 2000 mg. Pt will be given CHF and MI booklets along with heart healthy and low sodium diets. We discussed importance of brilinta and taking all of his meds on regular basis. Pt has attended CRP 2 [place] before and very interested in returning when program reopens. Discussed smoking cessation and handout will be given. Pt is on nicotine patches now. Pt stated he quit once for three and a half months but was very irritable during that time. Pt may consider continuing with the patches. Pt hoping to go home today. Stated he knows he needs to make changes. Luetta Nutting RN BSN 02/02/2019 9:20 AM

## 2019-02-02 NOTE — Telephone Encounter (Signed)
   TELEPHONE CALL NOTE  This patient has been deemed a candidate for follow-up tele-health visit to limit community exposure during the Covid-19 pandemic. I spoke with the patient via phone to discuss instructions. This has been outlined on the patient's AVS (dotphrase: hcevisitinfo). The patient was advised to review the section on consent for treatment as well. The patient will receive a phone call 2-3 days prior to their E-Visit at which time consent will be verbally confirmed.   A Virtual Office Visit appointment type has been scheduled for Hospital follow up with Dr. Rennis Golden or APP on team, with "VIDEO" or "TELEPHONE" in the appointment notes - patient prefers VIDEO type.  I have either confirmed the patient is active in MyChart or offered to send sign-up link to phone/email via Mychart icon beside patient's photo.  [place], [place] 02/02/2019 9:57 AM

## 2019-02-02 NOTE — ED Notes (Signed)
324 mg aspirin given to patient  4000 units heparin given to patient

## 2019-02-02 NOTE — Progress Notes (Addendum)
Progress Note  Patient Name: Alan White Date of Encounter: 02/02/2019  Primary Cardiologist: Chrystie Nose, MD  Subjective   No recurrent pain.   Inpatient Medications    Scheduled Meds: . aspirin EC  81 mg Oral Daily  . atorvastatin  40 mg Oral Daily  . carvedilol  3.125 mg Oral BID WC  . isosorbide mononitrate  30 mg Oral Daily  . losartan  25 mg Oral QHS  . nicotine  14 mg Transdermal Daily  . pantoprazole  40 mg Oral Daily  . sodium chloride flush  3 mL Intravenous Q12H  . spironolactone  12.5 mg Oral Daily  . ticagrelor  90 mg Oral BID   Continuous Infusions: . sodium chloride 10 mL/hr at 02/01/19 0600   PRN Meds: sodium chloride, acetaminophen, nitroGLYCERIN, ondansetron (ZOFRAN) IV, ondansetron (ZOFRAN) IV, sodium chloride flush   Vital Signs    Vitals:   02/01/19 1438 02/01/19 2039 02/02/19 0530 02/02/19 0807  BP: 100/77 91/64 106/73 105/74  Pulse: 83 86 97 80  Resp:  18 18   Temp: 98 F (36.7 C) 99.2 F (37.3 C) 98.2 F (36.8 C)   TempSrc: Oral Oral Oral   SpO2: 96% 96% 97%   Weight:   96.3 kg   Height:        Intake/Output Summary (Last 24 hours) at 02/02/2019 0949 Last data filed at 02/01/2019 1300 Gross per 24 hour  Intake 75.98 ml  Output 300 ml  Net -224.02 ml   Last 3 Weights 02/02/2019 01/31/2019 01/31/2019  Weight (lbs) 212 lb 3.2 oz 214 lb 8.1 oz 220 lb  Weight (kg) 96.253 kg 97.3 kg 99.791 kg      Telemetry    SR - Personally Reviewed  ECG    SR with anterior Q wave - Personally Reviewed  Physical Exam   GEN: No acute distress.   Neck: No JVD Cardiac: RRR, no murmurs, rubs, or gallops. Cath site stable.  Respiratory: Clear to auscultation bilaterally. GI: Soft, nontender, non-distended  MS: No edema; No deformity. Neuro:  Nonfocal  Psych: Normal affect   Labs    Chemistry Recent Labs  Lab 01/31/19 2237 02/01/19 0527 02/02/19 0449  NA 139 140 140  K 4.1 4.0 3.9  CL 106 108 108  CO2 21* 23 22  GLUCOSE 133* 119*  110*  BUN 16 18 17   CREATININE 1.20 1.10 1.26*  CALCIUM 8.9 8.9 8.5*  GFRNONAA >60 >60 >60  GFRAA >60 >60 >60  ANIONGAP 12 9 10      Hematology Recent Labs  Lab 01/31/19 2237 02/01/19 0527 02/02/19 0449  WBC 19.0* 13.5* 12.0*  RBC 4.93 4.71 4.17*  HGB 15.6 14.6 13.3  HCT 47.8 43.5 39.1  MCV 97.0 92.4 93.8  MCH 31.6 31.0 31.9  MCHC 32.6 33.6 34.0  RDW 14.6 14.5 14.6  PLT 238 227 171    Cardiac Enzymes Recent Labs  Lab 01/31/19 2237 02/01/19 0527 02/01/19 1036  TROPONINI >65.00* >65.00* >65.00*   No results for input(s): TROPIPOC in the last 168 hours.     Radiology    No results found.  Cardiac Studies   Coronary/Graft Acute MI Revascularization  LEFT HEART CATH AND CORONARY ANGIOGRAPHY  Conclusion     Prox RCA lesion is 20% stenosed.  Previously placed Prox Cx to Mid Cx stent (unknown type) is widely patent.  Ost LAD to Prox LAD lesion is 40% stenosed.  Prox LAD lesion is 100% stenosed.  Balloon angioplasty was performed  using a BALLOON [place] EMERGE MR 3.25X20.  Post intervention, there is a 0% residual stenosis.   1. Acute anterior STEMI secondary to thrombotic occlusion of the stented segment of the mid LAD  2. Successful PTCA with balloon angioplasty only of the mid LAD stented segment with excellent angiographic result 3. Patent stent mid Circumflex 4. Mild non-obstructive disease in the proximal LAD and proximal RCA 5. Elevated LVEDP  Recommendations: Will admit to ICU. Will continue DAPT with ASA and Brilinta for one year followed by DAPT with ASA and Plavix for lifetime. Continue statin and beta blocker. Will continue Aggrastat for 18 hours post PCI given large thrombus burden. Echo in am. Likely d/c home Monday if stable.     Echo IMPRESSIONS    1. The left ventricle has moderately reduced systolic function, with an ejection fraction of 35-40%. The cavity size was normal. Left ventricular diastolic Doppler parameters are  indeterminate.  2. LVEF is approximately 35 to 40% with apical ballooning consistent with Takotsubo's cardiomyopathy.  3. The right ventricle has normal systolic function. The cavity was normal. There is no increase in right ventricular wall thickness.  4. The aortic valve is tricuspid. Mild thickening of the aortic valve. Mild calcification of the aortic valve.  5. The interatrial septum was not assessed.  Patient Profile     58 y/o male with h/o CAD s/p previous stents to LAD and LCX, ICM, tobacco abuse and HLD admitted with anterior STEMI.   Assessment & Plan    1. Anterior STEMI. 2nd to thrombotic occlusion of the stented segment of the mid LAD in setting of noncompliance with meds -> underwent POBA. Patent mCX stent.  - Troponin peaked above 65. - Continue DAPT with ASA and Brilinta for one year followed by DAPT with ASA and Plavix for lifetime. Continue statin, Imdur and BB.  2. Acute on Chronic systolic CHF/ICM - Echo showed LVEF of 35 to 40% (EF was 40-45% in 2015) with apical ballooning consistent with Takotsubo's cardiomyopathy. - Continue Coreg, Losartan and spironolactone. Renal function 1.26 today>> repeat labs in few days.   3. HLD - 05/29/2018: Cholesterol, Total 188; HDL 27; LDL Calculated 132; Triglycerides 144  - Increase Lipitor to 80mg  daily. Repeat labs in 6-8 weeks.   4. Tobacco abuse - On nicotine patch  Likely home today.   For questions or updates, please contact [facility] Please consult [email or web address] for contact info under        Signed, Manson Passey, PA  02/02/2019, 9:49 AM    Agree with note by Chelsea Aus PA-C  Mr. Bieger is stable for discharge.  He had an anterior STEMI 2 days ago probably related to medication noncompliance.  His LAD stent was thrombosed and he underwent PL BA and aspiration thrombectomy with adjuvant Aggrastat by Dr. Clifton James.  His troponins were elevated.  His EF is slightly reduced compared to prior echo.  He does  continue to smoke.  He will need lifelong DAPT initially with Brilinta and transitioning to clopidogrel after 12 months.  He is up walking around without limitation.  His procedure was done radially and his right radial puncture site is stable.  He can be discharged home today with a TOC 7 followed by an appointment with Dr. Rennis Golden in 4 to 6 weeks.  Runell Gess, M.D., FACP, [facility], [facility] [address] [place], [place]  [address]  [phone] 02/02/2019 10:50 AM

## 2019-02-02 NOTE — Discharge Summary (Signed)
Discharge Summary    Patient ID: Alan White MRN: [id number]; DOB: 25-Apr-1961  Admit date: 01/31/2019 Discharge date: 02/02/2019  Primary Care Provider: Joycelyn Rua, MD  Primary Cardiologist: Chrystie Nose, MD   Discharge Diagnoses    Principal Problem:   Acute ST elevation myocardial infarction (STEMI) involving left anterior descending (LAD) coronary artery [facility]) Active Problems:   Tobacco use   Hyperlipidemia   ACS (acute coronary syndrome) (HCC)   Acute ST elevation myocardial infarction (STEMI) involving left anterior descending coronary artery (HCC)   Ischemic cardiomyopathy   Allergies No Known Allergies  Diagnostic Studies/Procedures    Coronary/Graft Acute MI Revascularization  LEFT HEART CATH AND CORONARY ANGIOGRAPHY  Conclusion     Prox RCA lesion is 20% stenosed.  Previously placed Prox Cx to Mid Cx stent (unknown type) is widely patent.  Ost LAD to Prox LAD lesion is 40% stenosed.  Prox LAD lesion is 100% stenosed.  Balloon angioplasty was performed using a BALLOON [place] EMERGE MR 3.25X20.  Post intervention, there is a 0% residual stenosis.  1. Acute anterior STEMI secondary to thrombotic occlusion of the stented segment of the mid LAD  2. Successful PTCA with balloon angioplasty only of the mid LAD stented segment with excellent angiographic result 3. Patent stent mid Circumflex 4. Mild non-obstructive disease in the proximal LAD and proximal RCA 5. Elevated LVEDP  Recommendations: Will admit to ICU. Will continue DAPT with ASA and Brilinta for one year followed by DAPT with ASA and Plavix for lifetime. Continue statin and beta blocker. Will continue Aggrastat for 18 hours post PCI given large thrombus burden. Echo in am. Likely d/c home Monday if stable.     Echo IMPRESSIONS   1. The left ventricle has moderately reduced systolic function, with an ejection fraction of 35-40%. The cavity size was normal. Left ventricular diastolic  Doppler parameters are indeterminate. 2. LVEF is approximately 35 to 40% with apical ballooning consistent with Takotsubo's cardiomyopathy. 3. The right ventricle has normal systolic function. The cavity was normal. There is no increase in right ventricular wall thickness. 4. The aortic valve is tricuspid. Mild thickening of the aortic valve. Mild calcification of the aortic valve. 5. The interatrial septum was not assessed.    History of Present Illness     58 y/o male with h/o CAD s/p previous stents to LAD and LCX, ICM, tobacco abuse and HLD admitted with anterior STEMI.   Hospital Course     Consultants: None  1. Anterior STEMI. 2nd to thrombotic occlusion of the stented segment of the mid LADin setting of noncompliance with meds ->  he underwent PL BA and aspiration thrombectomy with adjuvant Aggrastat by Dr. Clifton James. Patent mCX stent.  - Troponin peaked above 65. No complications. No recurrent chest pain. Ambulated without limitation.  - Continue DAPT with ASA and Brilinta for one year followed by DAPT with ASA and Plavix for lifetime. Continue statin, Imdur and BB.  2. Acute on Chronic systolic CHF/ICM - Echo showed LVEF of 35 to 40% (EF was 40-45% in 2015) with apical ballooning consistent with Takotsubo's cardiomyopathy. - Continue Coreg, Losartan and spironolactone. Renal function 1.26 today>> repeat labs in few days.   3. HLD - 05/29/2018: Cholesterol, Total 188; HDL 27; LDL Calculated 132; Triglycerides 144  - Increase Lipitor to 80mg  daily. Repeat labs in 6-8 weeks.   4. Tobacco abuse - On nicotine patch. Encouraged cessation.   The patient been seen by Dr. Allyson Sabal  today and deemed ready for  discharge home. All follow-up appointments have been scheduled. Discharge medications are listed below.   Discharge Vitals Blood pressure 105/74, pulse 80, temperature 98.2 F (36.8 C), temperature source Oral, resp. rate 18, height 6' 0.01" (1.829 m), weight 96.3 kg, SpO2 97  %.  Filed Weights   01/31/19 1903 01/31/19 2034 02/02/19 0530  Weight: 99.8 kg 97.3 kg 96.3 kg    Labs & Radiologic Studies    CBC Recent Labs    01/31/19 2237 02/01/19 0527 02/02/19 0449  WBC 19.0* 13.5* 12.0*  NEUTROABS 14.7*  --   --   HGB 15.6 14.6 13.3  HCT 47.8 43.5 39.1  MCV 97.0 92.4 93.8  PLT 238 227 171   Basic Metabolic Panel Recent Labs    19/14/7803/12/19 0527 02/01/19 1036 02/02/19 0449  NA 140  --  140  K 4.0  --  3.9  CL 108  --  108  CO2 23  --  22  GLUCOSE 119*  --  110*  BUN 18  --  17  CREATININE 1.10  --  1.26*  CALCIUM 8.9  --  8.5*  MG  --  1.8  --    Cardiac Enzymes Recent Labs    01/31/19 2237 02/01/19 0527 02/01/19 1036  TROPONINI >65.00* >65.00* >65.00*   Thyroid Function Tests Recent Labs    01/31/19 2045  TSH 1.793   _____________  No results found. Disposition   Pt is being discharged home today in good condition.  Follow-up Plans & Appointments    Follow-up Information    Azalee CourseMeng, [place], [place] Follow up on 02/09/2019.   Specialties:  Cardiology, Radiology Why:  @11 :30 for virtual video hospital follow up Contact information: [address] [place] [address] [phone]        [facility]. Go on 02/06/2019.   Specialty:  Cardiology Why:  anytime between 7:30 to 4 pm for BMET check  Non fasting Contact information: [address] [place] [id number] [phone]         Discharge Instructions    Amb Referral to Cardiac Rehabilitation   Complete by:  As directed    Diagnosis:   PTCA STEMI     Diet - low sodium heart healthy   Complete by:  As directed    Discharge instructions   Complete by:  As directed    No driving for 2 weeks. No lifting over 10 lbs for 4 weeks. No sexual activity for 4 weeks. You may not return to work until cleared by your cardiologist. Keep procedure site clean & dry. If you notice increased pain, swelling, bleeding or pus,  call/return!  You may shower, but no soaking baths/hot tubs/pools for 1 week.   Increase activity slowly   Complete by:  As directed       Discharge Medications   Allergies as of 02/02/2019   No Known Allergies     Medication List    STOP taking these medications   NexIUM 24HR 20 MG capsule Generic drug:  esomeprazole Replaced by:  pantoprazole 40 MG tablet     TAKE these medications   aspirin 81 MG EC tablet Take 1 tablet (81 mg total) by mouth daily. Start taking on:  Feb 03, 2019   atorvastatin 80 MG tablet Commonly known as:  LIPITOR Take 1 tablet (80 mg total) by mouth daily. What changed:    medication strength  how much to take   carvedilol 3.125 MG tablet Commonly  known as:  COREG Take 1 tablet (3.125 mg total) by mouth 2 (two) times daily with a meal.   isosorbide mononitrate 30 MG 24 hr tablet Commonly known as:  IMDUR Take 1 tablet (30 mg total) by mouth daily.   losartan 25 MG tablet Commonly known as:  COZAAR Take 1 tablet (25 mg total) by mouth at bedtime.   nicotine 14 mg/24hr patch Commonly known as:  NICODERM CQ - dosed in mg/24 hours Place 1 patch (14 mg total) onto the skin daily. Start taking on:  Feb 03, 2019   nitroGLYCERIN 0.4 MG SL tablet Commonly known as:  NITROSTAT Place 1 tablet (0.4 mg total) under the tongue every 5 (five) minutes x 3 doses as needed for chest pain.   pantoprazole 40 MG tablet Commonly known as:  PROTONIX Take 1 tablet (40 mg total) by mouth daily. Start taking on:  Feb 03, 2019 Replaces:  NexIUM 24HR 20 MG capsule   spironolactone 25 MG tablet Commonly known as:  ALDACTONE Take 0.5 tablets (12.5 mg total) by mouth daily.   ticagrelor 90 MG Tabs tablet Commonly known as:  BRILINTA Take 1 tablet (90 mg total) by mouth 2 (two) times daily.        Acute coronary syndrome (MI, NSTEMI, STEMI, etc) this admission?: Yes.     AHA/ACC Clinical Performance & Quality Measures: 1. Aspirin prescribed? - Yes 2.  ADP Receptor Inhibitor (Plavix/Clopidogrel, Brilinta/Ticagrelor or Effient/Prasugrel) prescribed (includes medically managed patients)? - Yes 3. Beta Blocker prescribed? - Yes 4. High Intensity Statin (Lipitor 40-80mg  or Crestor 20-40mg ) prescribed? - Yes 5. EF assessed during THIS hospitalization? - Yes 6. For EF <40%, was ACEI/ARB prescribed? - Yes 7. For EF <40%, Aldosterone Antagonist (Spironolactone or Eplerenone) prescribed? - Yes 8. Cardiac Rehab Phase II ordered (Included Medically managed Patients)? - Yes     Outstanding Labs/Studies   Lipid panel and LFts in 6-8 weeks. BMET in 1 week.  Duration of Discharge Encounter   Greater than 30 minutes including physician time.  Signed, Manson Passey, PA 02/02/2019, 11:13 AM

## 2019-02-02 NOTE — Telephone Encounter (Signed)
TOC appt per Bhagat on 02/09/19 @ 11:30am with Azalee Course

## 2019-02-02 NOTE — Telephone Encounter (Signed)
Left message to call back for TOC

## 2019-02-02 NOTE — Discharge Instructions (Signed)
YOUR CARDIOLOGY TEAM HAS ARRANGED FOR AN E-VISIT FOR YOUR APPOINTMENT - PLEASE REVIEW IMPORTANT INFORMATION BELOW SEVERAL DAYS PRIOR TO YOUR APPOINTMENT ° °Due to the recent COVID-19 pandemic, we are transitioning in-person office visits to tele-medicine visits in an effort to decrease unnecessary exposure to our patients, their families, and staff. These visits are billed to your insurance just like a normal visit is. We also encourage you to sign up for MyChart if you have not already done so. You will need a smartphone if possible. For patients that do not have this, we can still complete the visit using a regular telephone but do prefer a smartphone to enable video when possible. You may have a family member that lives with you that can help. If possible, we also ask that you have a blood pressure cuff and scale at home to measure your blood pressure, heart rate and weight prior to your scheduled appointment. Patients with clinical needs that need an in-person evaluation and testing will still be able to come to the office if absolutely necessary. If you have any questions, feel free to call our office. ° ° ° ° °YOUR PROVIDER WILL BE USING THE FOLLOWING PLATFORM TO COMPLETE YOUR VISIT:  ° °• IF USING MYCHART - How to Download the MyChart App to Your SmartPhone  ° °- If Apple, go to App Store and type in MyChart in the search bar and download the app. If Android, ask patient to go to Google Play Store and type in MyChart in the search bar and download the app. The app is free but as with any other app downloads, your phone may require you to verify saved payment information or Apple/Android password.  °- You will need to then log into the app with your MyChart username and password, and select [facility] as your healthcare provider to link the account.  °- When it is time for your visit, go to the MyChart app, find appointments, and click Begin Video Visit. Be sure to Select Allow for your device to access the  Microphone and Camera for your visit. You will then be connected, and your provider will be with you shortly. ° **If you have any issues connecting or need assistance, please contact MyChart service desk [phone] ([phone])** ° **If using a computer, in order to ensure the best quality for your visit, you will need to use either of the following Internet Browsers: Google Chrome or Microsoft Edge** ° °• IF USING DOXIMITY or [email or web address] - The staff will give you instructions on receiving your link to join the meeting the day of your visit.  ° ° ° ° °2-3 DAYS BEFORE YOUR APPOINTMENT ° °You will receive a telephone call from one of our [facility] team members - your caller ID may say "Unknown caller." If this is a video visit, we will walk you through how to get the video launched on your phone. We will remind you check your blood pressure, heart rate and weight prior to your scheduled appointment. If you have an Apple Watch or Kardia, please upload any pertinent ECG strips the day before or morning of your appointment to MyChart. Our staff will also make sure you have reviewed the consent and agree to move forward with your scheduled tele-health visit.  ° ° ° °THE DAY OF YOUR APPOINTMENT ° °Approximately 15 minutes prior to your scheduled appointment, you will receive a telephone call from one of [facility] team - your caller ID may say "Unknown caller."    Our staff will confirm medications, vital signs for the day and any symptoms you may be experiencing. Please have this information available prior to the time of visit start. It may also be helpful for you to have a pad of paper and pen handy for any instructions given during your visit. They will also walk you through joining the smartphone meeting if this is a video visit. ° ° ° °CONSENT FOR TELE-HEALTH VISIT - PLEASE REVIEW ° °I hereby voluntarily request, consent and authorize [facility] and its employed or contracted physicians, physician assistants, nurse  practitioners or other licensed health care professionals (the Practitioner), to provide me with telemedicine health care services (the “Services") as deemed necessary by the treating Practitioner. I acknowledge and consent to receive the Services by the Practitioner via telemedicine. I understand that the telemedicine visit will involve communicating with the Practitioner through live audiovisual communication technology and the disclosure of certain medical information by electronic transmission. I acknowledge that I have been given the opportunity to request an in-person assessment or other available alternative prior to the telemedicine visit and am voluntarily participating in the telemedicine visit. ° °I understand that I have the right to withhold or withdraw my consent to the use of telemedicine in the course of my care at any time, without affecting my right to future care or treatment, and that the Practitioner or I may terminate the telemedicine visit at any time. I understand that I have the right to inspect all information obtained and/or recorded in the course of the telemedicine visit and may receive copies of available information for a reasonable fee.  I understand that some of the potential risks of receiving the Services via telemedicine include:  °• Delay or interruption in medical evaluation due to technological equipment failure or disruption; °• Information transmitted may not be sufficient (e.g. poor resolution of images) to allow for appropriate medical decision making by the Practitioner; and/or  °• In rare instances, security protocols could fail, causing a breach of personal health information. ° °Furthermore, I acknowledge that it is my responsibility to provide information about my medical history, conditions and care that is complete and accurate to the best of my ability. I acknowledge that Practitioner's advice, recommendations, and/or decision may be based on factors not within  their control, such as incomplete or inaccurate data provided by me or distortions of diagnostic images or specimens that may result from electronic transmissions. I understand that the practice of medicine is not an exact science and that Practitioner makes no warranties or guarantees regarding treatment outcomes. I acknowledge that I will receive a copy of this consent concurrently upon execution via email to the email address I last provided but may also request a printed copy by calling the office of [facility].   ° °I understand that my insurance will be billed for this visit.  ° °I have read or had this consent read to me. °• I understand the contents of this consent, which adequately explains the benefits and risks of the Services being provided via telemedicine.  °• I have been provided ample opportunity to ask questions regarding this consent and the Services and have had my questions answered to my satisfaction. °• I give my informed consent for the services to be provided through the use of telemedicine in my medical care ° °By participating in this telemedicine visit I agree to the above. °

## 2019-02-03 NOTE — Telephone Encounter (Signed)
Patient contacted regarding discharge from [facility] on 02/02/19.  Patient understands to follow up with provider [facility] on 02/09/19 at 11:30 at [facility]. Patient understands discharge instructions? yes  Patient understands medications and regiment? yes  Patient understands to bring all medications to this visit? yes

## 2019-02-05 ENCOUNTER — Telehealth: Payer: Self-pay | Admitting: Internal Medicine

## 2019-02-05 NOTE — Telephone Encounter (Signed)
Spoke with patient and since he was d/c from hospital he has had liquid diarrhea and unable to sleep more than 2-3 hours at a time. He stated nothing seems to be bothering him just can not sleep long. All of this is new and he is concerned coming from medications. Will forward to A Duke PA for review

## 2019-02-05 NOTE — Telephone Encounter (Signed)
I would have him call his PCP and rule out for C diff, since he was just in the hospital.   He is on a good medication regimen. I would have him see his PCP. He has a virtual visit with Wynema Birch next week.

## 2019-02-05 NOTE — Telephone Encounter (Signed)
New message   Patient is having trouble sleeping, he believes it may be coming from his medication. Patient was just discharged from hospital.

## 2019-02-05 NOTE — Telephone Encounter (Signed)
Advised patient, verbalized understanding  

## 2019-02-06 ENCOUNTER — Telehealth: Payer: Self-pay | Admitting: Physician Assistant

## 2019-02-06 ENCOUNTER — Other Ambulatory Visit: Payer: Self-pay

## 2019-02-06 DIAGNOSIS — N179 Acute kidney failure, unspecified: Secondary | ICD-10-CM

## 2019-02-06 LAB — BASIC METABOLIC PANEL
BUN/Creatinine Ratio: 18 (ref 9–20)
BUN: 21 mg/dL (ref 6–24)
CO2: 22 mmol/L (ref 20–29)
Calcium: 9.9 mg/dL (ref 8.7–10.2)
Chloride: 100 mmol/L (ref 96–106)
Creatinine, Ser: 1.2 mg/dL (ref 0.76–1.27)
GFR calc Af Amer: 76 mL/min/{1.73_m2} (ref >59)
GFR calc non Af Amer: 66 mL/min/{1.73_m2} (ref >59)
Glucose: 105 mg/dL — ABNORMAL HIGH (ref 65–99)
Potassium: 4.7 mmol/L (ref 3.5–5.2)
Sodium: 140 mmol/L (ref 134–144)

## 2019-02-06 NOTE — Telephone Encounter (Signed)
Smartphone/ my chart active/ consent/ pre reg completed °

## 2019-02-09 ENCOUNTER — Encounter: Payer: Self-pay | Admitting: Physician Assistant

## 2019-02-09 ENCOUNTER — Telehealth (INDEPENDENT_AMBULATORY_CARE_PROVIDER_SITE_OTHER): Payer: BLUE CROSS/BLUE SHIELD | Admitting: Physician Assistant

## 2019-02-09 VITALS — BP 105/67 | HR 72 | Ht 72.0 in | Wt 205.0 lb

## 2019-02-09 DIAGNOSIS — I2510 Atherosclerotic heart disease of native coronary artery without angina pectoris: Secondary | ICD-10-CM | POA: Diagnosis not present

## 2019-02-09 DIAGNOSIS — I255 Ischemic cardiomyopathy: Secondary | ICD-10-CM | POA: Diagnosis not present

## 2019-02-09 DIAGNOSIS — E785 Hyperlipidemia, unspecified: Secondary | ICD-10-CM

## 2019-02-09 DIAGNOSIS — I952 Hypotension due to drugs: Secondary | ICD-10-CM

## 2019-02-09 DIAGNOSIS — R7303 Prediabetes: Secondary | ICD-10-CM

## 2019-02-09 MED ORDER — ISOSORBIDE MONONITRATE ER 30 MG PO TB24: 15.0000 mg | ORAL_TABLET | Freq: Every day | ORAL | 6 refills | Status: DC

## 2019-02-09 NOTE — Patient Instructions (Addendum)
Medication Instructions:   Reduce Imdur to 15 mg  (half tablet) daily  If you need a refill on your cardiac medications before your next appointment, please call your pharmacy.   Lab work:  You will need to come into the office in 2 months (July 2020) to have labs (blood work) drawn: Lipid  Liver   If you have labs (blood work) drawn today and your tests are completely normal, you will receive your results only by: Marland Kitchen MyChart Message (if you have MyChart) OR . A paper copy in the mail If you have any lab test that is abnormal or we need to change your treatment, we will call you to review the results.  Testing/Procedures:  Your physician has requested that you have an echocardiogram. Echocardiography is a painless test that uses sound waves to create images of your heart. It provides your doctor with information about the size and shape of your heart and how well your heart's chambers and valves are working. This procedure takes approximately one hour. There are no restrictions for this procedure.    Follow-Up: At [facility], you and your health needs are our priority.  As part of our continuing mission to provide you with exceptional heart care, we have created designated Provider Care Teams.  These Care Teams include your primary Cardiologist (physician) and Advanced Practice Providers (APPs -  Physician Assistants and Nurse Practitioners) who all work together to provide you with the care you need, when you need it. You will need a follow up appointment in 3 weeks.  Please call our office 2 months in advance to schedule this appointment.  You may see Chrystie Nose, MD or one of the following Advanced Practice Providers on your designated Care Team: [place], [place] . Micah Flesher, PA-C  Any Other Special Instructions Will Be Listed Below (If Applicable).

## 2019-02-09 NOTE — Progress Notes (Signed)
Virtual Visit via Telephone Note   This visit type was conducted due to national recommendations for restrictions regarding the COVID-19 Pandemic (e.g. social distancing) in an effort to limit this patient's exposure and mitigate transmission in our community.  Due to his co-morbid illnesses, this patient is at least at moderate risk for complications without adequate follow up.  This format is felt to be most appropriate for this patient at this time.  The patient did not have access to video technology/had technical difficulties with video requiring transitioning to audio format only (telephone).  All issues noted in this document were discussed and addressed.  No physical exam could be performed with this format.  Please refer to the patient's chart for his  consent to telehealth for [facility].   Date:  02/11/2019   ID:  Alan White, DOB 1961-07-06, MRN [id number]  Patient Location: Home Provider Location: Home  PCP:  Joycelyn Rua, MD  Cardiologist:  Chrystie Nose, MD  Electrophysiologist:  None   Evaluation Performed:  Follow-Up Visit  Chief Complaint:  Hospital followup  History of Present Illness:    Alan White is a 58 y.o. male with PMH of prediabetes, likely OSA, tobacco abuse, HLD, and CAD. Last hemoglobin A1c 6.0 on 12/28/2013. He was admitted for NSTEMI and underwent cath on 3/31/2015which showed EF 50-55%, normal RCA, 85-90% mid LCx treated 3.5 x 12 mm Promus Premier DES postdilated to 3.75 mm, 90% proximal to mid LAD stenosis treated with a 3.0 x 16 mm Promus primary DES overlapped with a 2.75 x 16 mm Promus DES. Echocardiogram obtained 03/31/2015showed EF 45-50%, mid to distal anteroapical and inferoapical hypokinesis, mild LVH. He had a recurrent chest discomfort and underwent relook cath in October 2015 that showed nonobstructive CAD, EF 55-65%.  I last saw the patient in February 2019, he described occasional sharp chest pain that only last a few seconds  each, the symptom sounds very atypical.  By the time he returned to see Dr. Rennis Golden in August 2019, he was doing well without any further chest discomfort.  His Plavix was stopped.  Unfortunately, patient presented back to the ED on 01/31/2019 with recurrent chest pain.  Unfortunately, since he saw Dr. Rennis Golden, he has decided to stop all of his other cardiac medication.  EKG demonstrated ST elevation in the anterolateral leads.  Code STEMI was called.  Emergent cardiac catheterization obtained on 01/31/2019 showed 100% proximal LAD occlusion treated with balloon angioplasty, 40% ostial to proximal LAD disease, 20% proximal RCA disease, patent left circumflex stent.  Echocardiogram obtained on the following day showed EF 35 to 40% with apical ballooning consistent with Takotsubo cardiomyopathy, no significant valve disease.  Postprocedure, troponin trended up to greater than 65.  EKG prior to discharge continue to show anterolateral infarct with diffuse T wave inversion in the anterior leads.  Postprocedure, he was placed on aspirin and Plavix for lifetime.  He was also placed on statin, Imdur, carvedilol, losartan and spironolactone.  Creatinine was 1.26 prior to discharge with plan for repeat lab work after discharge.  LDL in the hospital was 132.  Patient was contacted today via telephone visit.  Attempt was made to use doximity video conference visit, however due to technical difficulty, this was transitioned to telephone visit instead.  He denies any exertional symptoms since his discharge.  He has been compliant with aspirin and Brilinta.  I am okay with him going back to work on 5/18.  He works as a Production designer, theatre/television/film and it  does not do any strenuous activity.  His blood pressure was low this morning in the 80s.  I will decrease his Imdur to 15 mg daily.  Right now he is on the lowest possible dose for carvedilol, losartan and spironolactone.  If at all possible, I prefer him to stay on 3 heart failure medications.  He can  have follow-up in 2 to 3 weeks to reassess blood pressure.  We discussed his hospital course recently.  He is regretful that he stopped all of his medications without telling anybody.  He is doing more activities since discharge and eating better as well.  He has lost about 7 pounds.  He will need a fasting lipid panel and LFT in 6 to 8 weeks.  I will also arrange a 7353-month repeat echocardiogram.  I do not think we can uptitrate her heart failure medication any further given his blood pressure.  The patient does not have symptoms concerning for COVID-19 infection (fever, chills, cough, or new shortness of breath).    Past Medical History:  Diagnosis Date   CAD (coronary artery disease), 12/29/13 PCI/DES LCX and PCI/DES LAD with overlapping DES  12/30/2013   cath 07/05/14 [place]   Dyslipidemia, goal LDL below 70 12/30/2013   GERD (gastroesophageal reflux disease)    Metabolic syndrome, HgbA1C 6.0  12/30/2013   Sleep apnea    by history   Smoker    Past Surgical History:  Procedure Laterality Date   CARDIAC CATHETERIZATION  07/05/14   patent stents   CORONARY ANGIOPLASTY WITH STENT PLACEMENT  12/29/13   DES to LCX and overlapping stents DES mid LAD   CORONARY/GRAFT ACUTE MI REVASCULARIZATION N/A 01/31/2019   Procedure: Coronary/Graft Acute MI Revascularization;  Surgeon: Kathleene HazelMcAlhany, Christopher D, MD;  Location: [facility] INVASIVE CV LAB;  Service: Cardiovascular;  Laterality: N/A;   LEFT HEART CATH AND CORONARY ANGIOGRAPHY N/A 01/31/2019   Procedure: LEFT HEART CATH AND CORONARY ANGIOGRAPHY;  Surgeon: Kathleene HazelMcAlhany, Christopher D, MD;  Location: [facility] INVASIVE CV LAB;  Service: Cardiovascular;  Laterality: N/A;   LEFT HEART CATHETERIZATION WITH CORONARY ANGIOGRAM N/A 12/29/2013   Procedure: LEFT HEART CATHETERIZATION WITH CORONARY ANGIOGRAM;  Surgeon: Lesleigh NoeHenry W Smith III, MD;  [facility];  Service: Cardiovascular;  Laterality: N/A;   LEFT HEART CATHETERIZATION WITH CORONARY ANGIOGRAM N/A 07/05/2014    Procedure: LEFT HEART CATHETERIZATION WITH CORONARY ANGIOGRAM;  Surgeon: Peter M SwazilandJordan, MD;  [facility];  Service: Cardiovascular;  Laterality: N/A;   PERCUTANEOUS CORONARY STENT INTERVENTION (PCI-S)  12/29/2013   Procedure: PERCUTANEOUS CORONARY STENT INTERVENTION (PCI-S);  Surgeon: Lesleigh NoeHenry W Smith III, MD;  [facility];  Service: Cardiovascular;;     Current Meds  Medication Sig   aspirin EC 81 MG EC tablet Take 1 tablet (81 mg total) by mouth daily.   atorvastatin (LIPITOR) 80 MG tablet Take 1 tablet (80 mg total) by mouth daily.   carvedilol (COREG) 3.125 MG tablet Take 1 tablet (3.125 mg total) by mouth 2 (two) times daily with a meal.   isosorbide mononitrate (IMDUR) 30 MG 24 hr tablet Take 0.5 tablets (15 mg total) by mouth daily.   losartan (COZAAR) 25 MG tablet Take 1 tablet (25 mg total) by mouth at bedtime.   nicotine (NICODERM CQ - DOSED IN MG/24 HOURS) 14 mg/24hr patch Place 1 patch (14 mg total) onto the skin daily.   nitroGLYCERIN (NITROSTAT) 0.4 MG SL tablet Place 1 tablet (0.4 mg total) under the tongue every 5 (five) minutes  x 3 doses as needed for chest pain.   pantoprazole (PROTONIX) 40 MG tablet Take 1 tablet (40 mg total) by mouth daily.   spironolactone (ALDACTONE) 25 MG tablet Take 0.5 tablets (12.5 mg total) by mouth daily.   ticagrelor (BRILINTA) 90 MG TABS tablet Take 1 tablet (90 mg total) by mouth 2 (two) times daily.   [DISCONTINUED] isosorbide mononitrate (IMDUR) 30 MG 24 hr tablet Take 1 tablet (30 mg total) by mouth daily.     Allergies:   Patient has no known allergies.   Social History   Tobacco Use   Smoking status: Current Every Day Smoker    Packs/day: 1.00    Years: 30.00    Pack years: 30.00    Types: Cigarettes   Smokeless tobacco: Never Used  Substance Use Topics   Alcohol use: No    Comment: "Once in a blue moon"   Drug use: No     Family Hx: The patient's family history includes Cancer in his father  and mother; Diabetes in his father and mother; Heart attack in his brother and brother; Stroke in his sister.  ROS:   Please see the history of present illness.     All other systems reviewed and are negative.   Prior CV studies:   The following studies were reviewed today:  Cath 01/31/2019  Prox RCA lesion is 20% stenosed.  Previously placed Prox Cx to Mid Cx stent (unknown type) is widely patent.  Ost LAD to Prox LAD lesion is 40% stenosed.  Prox LAD lesion is 100% stenosed.  Balloon angioplasty was performed using a BALLOON [place] EMERGE MR 3.25X20.  Post intervention, there is a 0% residual stenosis.   1. Acute anterior STEMI secondary to thrombotic occlusion of the stented segment of the mid LAD  2. Successful PTCA with balloon angioplasty only of the mid LAD stented segment with excellent angiographic result 3. Patent stent mid Circumflex 4. Mild non-obstructive disease in the proximal LAD and proximal RCA 5. Elevated LVEDP  Recommendations: Will admit to ICU. Will continue DAPT with ASA and Brilinta for one year followed by DAPT with ASA and Plavix for lifetime. Continue statin and beta blocker. Will continue Aggrastat for 18 hours post PCI given large thrombus burden. Echo in am. Likely d/c home Monday if stable.    Echo 02/01/2019 IMPRESSIONS    1. The left ventricle has moderately reduced systolic function, with an ejection fraction of 35-40%. The cavity size was normal. Left ventricular diastolic Doppler parameters are indeterminate.  2. LVEF is approximately 35 to 40% with apical ballooning consistent with Takotsubo's cardiomyopathy.  3. The right ventricle has normal systolic function. The cavity was normal. There is no increase in right ventricular wall thickness.  4. The aortic valve is tricuspid. Mild thickening of the aortic valve. Mild calcification of the aortic valve.  5. The interatrial septum was not assessed.  Labs/Other Tests and Data Reviewed:    EKG:   An ECG dated 02/02/2019 was personally reviewed today and demonstrated:  Sinus rhythm with poor R progression anterior leads.  Recent Labs: 05/29/2018: ALT 14 01/31/2019: TSH 1.793 02/01/2019: Magnesium 1.8 02/02/2019: Hemoglobin 13.3; Platelets 171 02/06/2019: BUN 21; Creatinine, Ser 1.20; Potassium 4.7; Sodium 140   Recent Lipid Panel Lab Results  Component Value Date/Time   CHOL 188 05/29/2018 08:45 AM   TRIG 144 05/29/2018 08:45 AM   HDL 27 (L) 05/29/2018 08:45 AM   CHOLHDL 7.0 (H) 05/29/2018 08:45 AM   CHOLHDL 4.3 08/10/2014  08:10 AM   LDLCALC 132 (H) 05/29/2018 08:45 AM    Wt Readings from Last 3 Encounters:  02/09/19 205 lb (93 kg)  02/02/19 212 lb 3.2 oz (96.3 kg)  05/29/18 215 lb (97.5 kg)     Objective:    Vital Signs:  BP 105/67    Pulse 72    Ht 6' (1.829 m)    Wt 205 lb (93 kg)    BMI 27.80 kg/m    VITAL SIGNS:  reviewed  ASSESSMENT & PLAN:    1. CAD: Recent anterior STEMI.  Compliant with aspirin and Brilinta.  He denies any further chest discomfort.  Continue high-dose statin.  Reduce Imdur to 15 mg daily to avoid significant hypotension.  2. Ischemic cardiomyopathy: Currently on carvedilol, losartan and spironolactone.  Obtain repeat echocardiogram in 3 months.  3. Hypotension: Blood pressure has been low in the 80s.  I will reduce Imdur to 15 mg daily.  At this time, he is on the lowest possible dose for carvedilol, losartan and spironolactone.  If blood pressure remain low, we may have to discontinue spironolactone.   4. Hyperlipidemia: Continue high-dose statin.  Fasting lipid panel and LFT in 6 to 8 weeks  5. Prediabetes: Managed by primary care provider  COVID-19 Education: The signs and symptoms of COVID-19 were discussed with the patient and how to seek care for testing (follow up with PCP or arrange E-visit).  The importance of social distancing was discussed today.  Time:   Today, I have spent 18 minutes with the patient with telehealth technology  discussing the above problems.     Medication Adjustments/Labs and Tests Ordered: Current medicines are reviewed at length with the patient today.  Concerns regarding medicines are outlined above.   Tests Ordered: Orders Placed This Encounter  Procedures   Lipid panel   Hepatic function panel   ECHOCARDIOGRAM COMPLETE    Medication Changes: Meds ordered this encounter  Medications   isosorbide mononitrate (IMDUR) 30 MG 24 hr tablet    Sig: Take 0.5 tablets (15 mg total) by mouth daily.    Dispense:  30 tablet    Refill:  6    Medication was reduced to  daily    Disposition:  Follow up in 3 month(s)  Signed, Azalee Course, PA  02/11/2019 11:27 PM    [facility]

## 2019-02-12 ENCOUNTER — Telehealth (HOSPITAL_COMMUNITY): Payer: Self-pay | Admitting: *Deleted

## 2019-02-12 NOTE — Telephone Encounter (Signed)
Called and left message for general well being and cardiac rehab. Requested a call back. Alanson Aly, BSN Cardiac and Emergency planning/management officer

## 2019-02-18 ENCOUNTER — Encounter: Payer: Self-pay | Admitting: Physician Assistant

## 2019-02-18 NOTE — Telephone Encounter (Signed)
Alan White, I am ok with this, will type up the return to work letter, email or mail to home is fine with the patient. I have spoken to him over the phone

## 2019-02-20 ENCOUNTER — Telehealth: Payer: Self-pay | Admitting: Physician Assistant

## 2019-02-20 NOTE — Telephone Encounter (Signed)
02/20/2019 Received signed FMLA Form and Physician Statement from [facility] back from [place], [place], I then inter- office it to [facility] to finish processing. cbr

## 2019-03-18 DIAGNOSIS — I2510 Atherosclerotic heart disease of native coronary artery without angina pectoris: Secondary | ICD-10-CM | POA: Diagnosis not present

## 2019-03-18 DIAGNOSIS — E7800 Pure hypercholesterolemia, unspecified: Secondary | ICD-10-CM | POA: Diagnosis not present

## 2019-03-27 ENCOUNTER — Telehealth (HOSPITAL_COMMUNITY): Payer: Self-pay

## 2019-03-27 NOTE — Telephone Encounter (Signed)
No response from pt, closed referral. °

## 2019-04-26 ENCOUNTER — Other Ambulatory Visit: Payer: Self-pay | Admitting: Physician Assistant

## 2019-05-19 ENCOUNTER — Ambulatory Visit: Payer: BC Managed Care – PPO | Admitting: Physician Assistant

## 2019-05-19 ENCOUNTER — Encounter: Payer: Self-pay | Admitting: Physician Assistant

## 2019-05-19 ENCOUNTER — Other Ambulatory Visit: Payer: Self-pay

## 2019-05-19 VITALS — BP 112/77 | HR 67 | Temp 97.3°F | Ht 72.0 in | Wt 207.0 lb

## 2019-05-19 DIAGNOSIS — E785 Hyperlipidemia, unspecified: Secondary | ICD-10-CM | POA: Diagnosis not present

## 2019-05-19 DIAGNOSIS — R5383 Other fatigue: Secondary | ICD-10-CM | POA: Diagnosis not present

## 2019-05-19 DIAGNOSIS — R7303 Prediabetes: Secondary | ICD-10-CM

## 2019-05-19 DIAGNOSIS — I255 Ischemic cardiomyopathy: Secondary | ICD-10-CM

## 2019-05-19 DIAGNOSIS — I2510 Atherosclerotic heart disease of native coronary artery without angina pectoris: Secondary | ICD-10-CM | POA: Diagnosis not present

## 2019-05-19 LAB — CBC
Hematocrit: 40.4 % (ref 37.5–51.0)
Hemoglobin: 13.9 g/dL (ref 13.0–17.7)
MCH: 31.6 pg (ref 26.6–33.0)
MCHC: 34.4 g/dL (ref 31.5–35.7)
MCV: 92 fL (ref 79–97)
Platelets: 262 10*3/uL (ref 150–450)
RBC: 4.4 x10E6/uL (ref 4.14–5.80)
RDW: 12.7 % (ref 11.6–15.4)
WBC: 12.2 10*3/uL — ABNORMAL HIGH (ref 3.4–10.8)

## 2019-05-19 LAB — LIPID PANEL
Chol/HDL Ratio: 4.1 ratio (ref 0.0–5.0)
Cholesterol, Total: 114 mg/dL (ref 100–199)
HDL: 28 mg/dL — ABNORMAL LOW (ref >39)
LDL Calculated: 60 mg/dL (ref 0–99)
Triglycerides: 128 mg/dL (ref 0–149)
VLDL Cholesterol Cal: 26 mg/dL (ref 5–40)

## 2019-05-19 LAB — COMPREHENSIVE METABOLIC PANEL
ALT: 22 IU/L (ref 0–44)
AST: 18 IU/L (ref 0–40)
Albumin/Globulin Ratio: 1.7 (ref 1.2–2.2)
Albumin: 4.5 g/dL (ref 3.8–4.9)
Alkaline Phosphatase: 137 IU/L — ABNORMAL HIGH (ref 39–117)
BUN/Creatinine Ratio: 18 (ref 9–20)
BUN: 21 mg/dL (ref 6–24)
Bilirubin Total: 0.6 mg/dL (ref 0.0–1.2)
CO2: 24 mmol/L (ref 20–29)
Calcium: 9.7 mg/dL (ref 8.7–10.2)
Chloride: 103 mmol/L (ref 96–106)
Creatinine, Ser: 1.16 mg/dL (ref 0.76–1.27)
GFR calc Af Amer: 79 mL/min/{1.73_m2} (ref >59)
GFR calc non Af Amer: 69 mL/min/{1.73_m2} (ref >59)
Globulin, Total: 2.6 g/dL (ref 1.5–4.5)
Glucose: 96 mg/dL (ref 65–99)
Potassium: 4.8 mmol/L (ref 3.5–5.2)
Sodium: 141 mmol/L (ref 134–144)
Total Protein: 7.1 g/dL (ref 6.0–8.5)

## 2019-05-19 LAB — HEMOGLOBIN A1C
Est. average glucose Bld gHb Est-mCnc: 120 mg/dL
Hgb A1c MFr Bld: 5.8 % — ABNORMAL HIGH (ref 4.8–5.6)

## 2019-05-19 NOTE — Progress Notes (Signed)
Cardiology Office Note    Date:  05/19/2019   ID:  Alan White, DOB 10-11-1960, MRN [id number]  PCP:  Joycelyn RuaMeyers, Stephen, MD  Cardiologist:  Dr. Rennis GoldenHilty   Chief Complaint  Patient presents with   Follow-up    seen for Dr. Rennis GoldenHilty. 3 month after STEMI    History of Present Illness:  Alan MedinaWilliam White is a 58 y.o. male with PMH of prediabetes, likely OSA, tobacco abuse, HLD, and CAD. Last hemoglobin A1c 6.0 on 12/28/2013. He was admitted for NSTEMI and underwent cath on 3/31/2015which showed EF 50-55%, normal RCA, 85-90% mid LCx treated 3.5 x 12 mm Promus Premier DES postdilated to 3.75 mm, 90% proximal to mid LAD stenosis treated with a 3.0 x 16 mm Promus primary DES overlapped with a 2.75 x 16 mm Promus DES. Echocardiogram obtained 03/31/2015showed EF 45-50%, mid to distal anteroapical and inferoapical hypokinesis, mild LVH. He had a recurrent chest discomfort and underwent relook cath in October 2015 that showed nonobstructive CAD, EF 55-65%.  I last saw the patient in February 2019, he described occasional sharp chest pain that only last a few seconds each, the symptom sounds very atypical.  By the time he returned to see Dr. Rennis GoldenHilty in August 2019, he was doing well without any further chest discomfort.  His Plavix was stopped.  Patient returned to the hospital on 01/31/2019 with anterolateral STEMI after stopping all his cardiac medications. Emergent cardiac catheterization obtained on 01/31/2019 showed 100% proximal LAD occlusion treated with balloon angioplasty, 40% ostial to proximal LAD disease, 20% proximal RCA disease, patent left circumflex stent.  Echocardiogram obtained on the following day showed EF 35 to 40% with apical ballooning consistent with Takotsubo cardiomyopathy, no significant valve disease.  Postprocedure, troponin trended up to >65.  EKG prior to discharge continue to show anterolateral infarct with diffuse T wave inversion in the anterior leads.  Postprocedure, he was placed on  aspirin and Plavix for lifetime.  He was also placed on statin, Imdur, carvedilol, losartan and spironolactone.    I last saw the patient via telehealth medicine on 02/09/2019, his blood pressure was low that day, I decrease his Imdur to 15 mg daily.  Imdur was initially taken of due to headache.  He returned today for cardiology follow-up.  He is main issue is fatigue and a mild headache.  He says his headache significantly improved after discontinuing the Imdur.  He remains fatigued about 5 out of 7 days.  He denies any lower extremity edema, orthopnea or PND.  He has not had any recent chest pain.  He has been very compliant with his other medications.  I recommended repeat lab work with CMP, CBC, fasting lipid panel and hemoglobin A1C today.  Recent TSH was normal.  I plan to obtain a repeat echocardiogram since it has been 3 months since his heart attack.  If EF is greater than 40%, I likely would take him off of the spironolactone to help with his fatigue.  According to patient, he has been eating her healthy diet since recent heart attack.   Past Medical History:  Diagnosis Date   CAD (coronary artery disease), 12/29/13 PCI/DES LCX and PCI/DES LAD with overlapping DES  12/30/2013   cath 07/05/14 [place]   Dyslipidemia, goal LDL below 70 12/30/2013   GERD (gastroesophageal reflux disease)    Metabolic syndrome, HgbA1C 6.0  12/30/2013   Sleep apnea    by history   Smoker     Past Surgical History:  Procedure  Laterality Date   CARDIAC CATHETERIZATION  07/05/14   patent stents   CORONARY ANGIOPLASTY WITH STENT PLACEMENT  12/29/13   DES to LCX and overlapping stents DES mid LAD   CORONARY/GRAFT ACUTE MI REVASCULARIZATION N/A 01/31/2019   Procedure: Coronary/Graft Acute MI Revascularization;  Surgeon: Kathleene HazelMcAlhany, Christopher D, MD;  Location: [facility];  Service: Cardiovascular;  Laterality: N/A;   LEFT HEART CATH AND CORONARY ANGIOGRAPHY N/A 01/31/2019   Procedure: LEFT HEART CATH AND  CORONARY ANGIOGRAPHY;  Surgeon: Kathleene HazelMcAlhany, Christopher D, MD;  Location: [facility]VAS[facility] CV [facility];  Service: Cardiovascular;  Laterality: N/A;   LEFT HEART CATHETERIZATION WITH CORONARY ANGIOGRAM N/A 12/29/2013   Procedure: LEFT HEART CATHETERIZATION WITH CORONARY ANGIOGRAM;  Surgeon: Lesleigh NoeHenry W Smith III, MD;  [facility];  Service: Cardiovascular;  Laterality: N/A;   LEFT HEART CATHETERIZATION WITH CORONARY ANGIOGRAM N/A 07/05/2014   Procedure: LEFT HEART CATHETERIZATION WITH CORONARY ANGIOGRAM;  Surgeon: Peter M SwazilandJordan, MD;  [facility];  Service: Cardiovascular;  Laterality: N/A;   PERCUTANEOUS CORONARY STENT INTERVENTION (PCI-S)  12/29/2013   Procedure: PERCUTANEOUS CORONARY STENT INTERVENTION (PCI-S);  Surgeon: Lesleigh NoeHenry W Smith III, MD;  [facility];  Service: Cardiovascular;;    Current Medications: Outpatient Medications Prior to Visit  Medication Sig Dispense Refill   aspirin EC 81 MG EC tablet Take 1 tablet (81 mg total) by mouth daily. 30 tablet 11   atorvastatin (LIPITOR) 80 MG tablet Take 1 tablet (80 mg total) by mouth daily. 30 tablet 6   carvedilol (COREG) 3.125 MG tablet Take 1 tablet (3.125 mg total) by mouth 2 (two) times daily with a meal. 60 tablet 8   losartan (COZAAR) 25 MG tablet Take 1 tablet (25 mg total) by mouth at bedtime. 30 tablet 6   nitroGLYCERIN (NITROSTAT) 0.4 MG SL tablet Place 1 tablet (0.4 mg total) under the tongue every 5 (five) minutes x 3 doses as needed for chest pain. 25 tablet 12   pantoprazole (PROTONIX) 40 MG tablet TAKE 1 TABLET BY MOUTH EVERY DAY 30 tablet 1   spironolactone (ALDACTONE) 25 MG tablet Take 0.5 tablets (12.5 mg total) by mouth daily. 30 tablet 6   ticagrelor (BRILINTA) 90 MG TABS tablet Take 1 tablet (90 mg total) by mouth 2 (two) times daily. 60 tablet 11   isosorbide mononitrate (IMDUR) 30 MG 24 hr tablet Take 0.5 tablets (15 mg total) by mouth daily. 30 tablet 6   nicotine (NICODERM CQ - DOSED IN MG/24  HOURS) 14 mg/24hr patch Place 1 patch (14 mg total) onto the skin daily. 28 patch 0   No facility-administered medications prior to visit.      Allergies:   Patient has no known allergies.   Social History   Socioeconomic History   Marital status: Married    Spouse name:     Number of children: Not on file   Years of education: Not on file   Highest education level: Not on file  Occupational History   Occupation: Event organiserManager    Employer: [employer]  [employer]   Financial resource strain: Not on file   Food insecurity    Worry: Not on file    Inability: Not on file   Transportation needs    Medical: Not on file    Non-medical: Not on file  Tobacco Use   Smoking status: Current Every Day Smoker    Packs/day: 1.00    Years: 30.00    Pack  years: 30.00    Types: Cigarettes   Smokeless tobacco: Never Used  Substance and Sexual Activity   Alcohol use: No    Comment: "Once in a blue moon"   Drug use: No   Sexual activity: Not on file  Lifestyle   Physical activity    Days per week: Not on file    Minutes per session: Not on file   Stress: Not on file  Relationships   Social connections    Talks on phone: Not on file    Gets together: Not on file    Attends religious service: Not on file    Active member of club or organization: Not on file    Attends meetings of clubs or organizations: Not on file    Relationship status: Not on file  Other Topics Concern   Not on file  Social History Narrative   Pt lives with wife.     Family History:  The patient's family history includes Cancer in his father and mother; Diabetes in his father and mother; Heart attack in his brother and brother; Stroke in his sister.   ROS:   Please see the history of present illness.    ROS All other systems reviewed and are negative.   PHYSICAL EXAM:   VS:  BP 112/77    Pulse 67    Temp (!) 97.3 F (36.3 C)    Ht 6' (1.829 m)    Wt 207 lb (93.9 kg)    SpO2 96%     BMI 28.07 kg/m    GEN: Well nourished, well developed, in no acute distress  HEENT: normal  Neck: no JVD, carotid bruits, or masses Cardiac: RRR; no murmurs, rubs, or gallops,no edema  Respiratory:  clear to auscultation bilaterally, normal work of breathing GI: soft, nontender, nondistended, + BS MS: no deformity or atrophy  Skin: warm and dry, no rash Neuro:  Alert and Oriented x 3, Strength and sensation are intact Psych: euthymic mood, full affect  Wt Readings from Last 3 Encounters:  05/19/19 207 lb (93.9 kg)  02/09/19 205 lb (93 kg)  02/02/19 212 lb 3.2 oz (96.3 kg)      Studies/Labs Reviewed:   EKG:  EKG is ordered today.  The ekg ordered today demonstrates sinus rhythm with history of anterior infarct.  Persistent Q waves and T wave inversion in the anterior leads.  Recent Labs: 05/29/2018: ALT 14 01/31/2019: TSH 1.793 02/01/2019: Magnesium 1.8 02/02/2019: Hemoglobin 13.3; Platelets 171 02/06/2019: BUN 21; Creatinine, Ser 1.20; Potassium 4.7; Sodium 140   Lipid Panel    Component Value Date/Time   CHOL 188 05/29/2018 0845   TRIG 144 05/29/2018 0845   HDL 27 (L) 05/29/2018 0845   CHOLHDL 7.0 (H) 05/29/2018 0845   CHOLHDL 4.3 08/10/2014 0810   VLDL 27 08/10/2014 0810   LDLCALC 132 (H) 05/29/2018 0845    Additional studies/ records that were reviewed today include:   Cath 01/31/2019  Prox RCA lesion is 20% stenosed.  Previously placed Prox Cx to Mid Cx stent (unknown type) is widely patent.  Ost LAD to Prox LAD lesion is 40% stenosed.  Prox LAD lesion is 100% stenosed.  Balloon angioplasty was performed using a BALLOON [employer] [facility]GE MR 3.25X20.  Post intervention, there is a 0% residual stenosis.   1. Acute anterior STEMI secondary to thrombotic occlusion of the stented segment of the mid LAD  2. Successful PTCA with balloon angioplasty only of the mid LAD stented segment with excellent  angiographic result 3. Patent stent mid Circumflex 4. Mild non-obstructive  disease in the proximal LAD and proximal RCA 5. Elevated LVEDP  Recommendations: Will admit to ICU. Will continue DAPT with ASA and Brilinta for one year followed by DAPT with ASA and Plavix for lifetime. Continue statin and beta blocker. Will continue Aggrastat for 18 hours post PCI given large thrombus burden. Echo in am. Likely d/c home Monday if stable.    Echo 02/01/2019 IMPRESSIONS   1. The left ventricle has moderately reduced systolic function, with an ejection fraction of 35-40%. The cavity size was normal. Left ventricular diastolic Doppler parameters are indeterminate.  2. LVEF is approximately 35 to 40% with apical ballooning consistent with Takotsubo's cardiomyopathy.  3. The right ventricle has normal systolic function. The cavity was normal. There is no increase in right ventricular wall thickness.  4. The aortic valve is tricuspid. Mild thickening of the aortic valve. Mild calcification of the aortic valve.  5. The interatrial septum was not assessed.   ASSESSMENT:    1. Fatigue, unspecified type   2. Coronary artery disease involving native coronary artery of native heart without angina pectoris   3. Prediabetes   4. Hyperlipidemia LDL goal <70   5. Ischemic cardiomyopathy      PLAN:  In order of problems listed above:  1. Persistent fatigue: Obtain CBC and CMP.  Recent TSH was normal.  I plan to obtain a 2-week echocardiogram to follow-up on his ischemic cardiomyopathy, if EF improved to greater than 40%, I likely will discontinue the spironolactone to create more blood pressure room.  2. Ischemic cardiomyopathy: Due for 81-month repeat echocardiogram  3. CAD: EKG today continue to show significant anterior injury with Q waves and T wave inversion in anterior leads.  Last intervention was in May 2020 when he had anterolateral STEMI and received balloon angioplasty to proximal LAD.  4. Hyperlipidemia: Due for repeat fasting lipid panel.  Continue current statin  therapy  5. Prediabetes: Obtain hemoglobin A1c.    Medication Adjustments/Labs and Tests Ordered: Current medicines are reviewed at length with the patient today.  Concerns regarding medicines are outlined above.  Medication changes, Labs and Tests ordered today are listed in the Patient Instructions below. Patient Instructions  Medication Instructions:  Your physician recommends that you continue on your current medications as directed. Please refer to the Current Medication list given to you today.  If you need a refill on your cardiac medications before your next appointment, please call your pharmacy.   Lab work: You will need to have labs (blood work) drawn today:  CBC  CMET  Fasting Lipid  A1C  If you have labs (blood work) drawn today and your tests are completely normal, you will receive your results only by:  MyChart Message (if you have MyChart) OR  A paper copy in the mail If you have any lab test that is abnormal or we need to change your treatment, we will call you to review the results.  Testing/Procedures: Your physician has requested that you have an Echocardiogram. Echocardiography is a painless test that uses sound waves to create images of your heart. It provides your doctor with information about the size and shape of your heart and how well your hearts chambers and valves are working. This procedure takes approximately one hour. There are no restrictions for this procedure.   Please schedule for 2 weeks   Follow-Up: At [facility], you and your health needs are our priority.  As  part of our continuing mission to provide you with exceptional heart care, we have created designated Provider Care Teams.  These Care Teams include your primary Cardiologist (physician) and Advanced Practice Providers (APPs -  Physician Assistants and Nurse Practitioners) who all work together to provide you with the care you need, when you need it. You will need a follow up  appointment in 3 months.  Please call our office 2 months in advance to schedule this appointment.  You may see Chrystie NoseKenneth C Hilty, MD or one of the following Advanced Practice Providers on your designated Care Team: Azalee CourseHao Ileane Sando, [place]PA-C  Micah FlesherAngela Duke, PA-C  Any Other Special Instructions Will Be Listed Below (If Applicable).       Ramond DialSigned, Kiaraliz Rafuse, [place]  05/19/2019 9:01 AM    [facility] [address], [place], [place]  [address] Phone: [phone]; Fax: [phone]

## 2019-05-19 NOTE — Patient Instructions (Signed)
Medication Instructions:  Your physician recommends that you continue on your current medications as directed. Please refer to the Current Medication list given to you today.  If you need a refill on your cardiac medications before your next appointment, please call your pharmacy.   Lab work: You will need to have labs (blood work) drawn today:  CBC  CMET  Fasting Lipid  A1C  If you have labs (blood work) drawn today and your tests are completely normal, you will receive your results only by: Marland Kitchen MyChart Message (if you have MyChart) OR . A paper copy in the mail If you have any lab test that is abnormal or we need to change your treatment, we will call you to review the results.  Testing/Procedures: Your physician has requested that you have an Echocardiogram. Echocardiography is a painless test that uses sound waves to create images of your heart. It provides your doctor with information about the size and shape of your heart and how well your heart's chambers and valves are working. This procedure takes approximately one hour. There are no restrictions for this procedure.   Please schedule for 2 weeks   Follow-Up: At [facility], you and your health needs are our priority.  As part of our continuing mission to provide you with exceptional heart care, we have created designated Provider Care Teams.  These Care Teams include your primary Cardiologist (physician) and Advanced Practice Providers (APPs -  Physician Assistants and Nurse Practitioners) who all work together to provide you with the care you need, when you need it. You will need a follow up appointment in 3 months.  Please call our office 2 months in advance to schedule this appointment.  You may see Pixie Casino, MD or one of the following Advanced Practice Providers on your designated Care Team: [place], [place] . Fabian Sharp, PA-C  Any Other Special Instructions Will Be Listed Below (If Applicable).

## 2019-05-23 ENCOUNTER — Other Ambulatory Visit: Payer: Self-pay | Admitting: Cardiovascular Disease

## 2019-06-02 ENCOUNTER — Other Ambulatory Visit: Payer: Self-pay

## 2019-06-02 ENCOUNTER — Ambulatory Visit (HOSPITAL_COMMUNITY): Payer: BC Managed Care – PPO | Attending: Cardiovascular Disease

## 2019-06-02 DIAGNOSIS — I255 Ischemic cardiomyopathy: Secondary | ICD-10-CM

## 2019-06-10 ENCOUNTER — Encounter: Payer: Self-pay | Admitting: Internal Medicine

## 2019-06-10 ENCOUNTER — Telehealth (INDEPENDENT_AMBULATORY_CARE_PROVIDER_SITE_OTHER): Payer: BC Managed Care – PPO | Admitting: Internal Medicine

## 2019-06-10 ENCOUNTER — Telehealth: Payer: Self-pay | Admitting: Internal Medicine

## 2019-06-10 VITALS — BP 108/73 | HR 76 | Wt 208.0 lb

## 2019-06-10 DIAGNOSIS — R5383 Other fatigue: Secondary | ICD-10-CM

## 2019-06-10 DIAGNOSIS — Z720 Tobacco use: Secondary | ICD-10-CM

## 2019-06-10 DIAGNOSIS — I5043 Acute on chronic combined systolic (congestive) and diastolic (congestive) heart failure: Secondary | ICD-10-CM | POA: Diagnosis not present

## 2019-06-10 DIAGNOSIS — I255 Ischemic cardiomyopathy: Secondary | ICD-10-CM

## 2019-06-10 DIAGNOSIS — I2510 Atherosclerotic heart disease of native coronary artery without angina pectoris: Secondary | ICD-10-CM | POA: Diagnosis not present

## 2019-06-10 MED ORDER — SACUBITRIL-VALSARTAN 24-26 MG PO TABS: 1.0000 | ORAL_TABLET | Freq: Two times a day (BID) | ORAL | 11 refills | Status: DC

## 2019-06-10 NOTE — Progress Notes (Signed)
Virtual Visit via Telephone Note   This visit type was conducted due to national recommendations for restrictions regarding the COVID-19 Pandemic (e.g. social distancing) in an effort to limit this patient's exposure and mitigate transmission in our community.  Due to his co-morbid illnesses, this patient is at least at moderate risk for complications without adequate follow up.  This format is felt to be most appropriate for this patient at this time.  The patient did not have access to video technology/had technical difficulties with video requiring transitioning to audio format only (telephone).  All issues noted in this document were discussed and addressed.  No physical exam could be performed with this format.  Please refer to the patient's chart for his  consent to telehealth for [facility].   Evaluation Performed:  Telephone follow-up  Date:  06/10/2019   ID:  Alan White, DOB [birth year], MRN [id number]  Patient Location:  [address] [place] [address]  Provider location:   [address], [address] [place], [place] [address]  PCP:  Joycelyn Rua, MD  Cardiologist:  Chrystie Nose, MD Electrophysiologist:  None   Chief Complaint:  Fatigue, DOE  History of Present Illness:    Alan White is a 58 y.o. male who presents via audio/video conferencing for a telehealth visit today.  Alan White is seen today for telephone follow-up.  Is a history of coronary artery disease and had a cath in 2015 receiving stents to the circumflex and LAD.  LVEF at the time was 45 to 50%.  Unfortunately, he presented back to the emergency department in May 2020 with recurrent chest pain.  At that time he was found to have ST elevation in the anterolateral leads and emergent cardiac catheterization showed occlusion of the LAD for which he underwent balloon angioplasty.  Echo then showed a decline in LVEF to 35 to 40% with some apical ballooning, which was more likely aneurysm.   Recently saw Azalee Course, PA-C, back in the office in August and was complaining of worsening fatigue and dyspnea on exertion.  EKG showed anterolateral infarct pattern with T wave inversions and borderline ST elevation suggestive of possible aneurysm.  A repeat echo was ordered.  That echo on 06/02/2019 showed further decline in LVEF to 25 to 30% with severe akinesis of the mid anteroseptal, lateral and apical walls.  There was grade 2 diastolic dysfunction.  Currently he reports shortness of breath with mild exertion, more consistent with NYHA class III symptoms.  He continues to work about 8 to 10 hours a day but says it is become increasingly difficult for him to do his job.  He denies any lower extremity edema or significant weight gain.  He is not currently on the diuretic.  Heart failure medicines include Aldactone, losartan and carvedilol as well as aspirin and Brilinta.  He has reported compliance with medications.  He also mentioned that he did stop smoking about 36 hours ago and says that he is now committed to ongoing smoking cessation.  We did discuss the increased risk he is potentially at with his LVEF being less than 35% of potentially life-threatening ventricular arrhythmias.  The patient does not have symptoms concerning for COVID-19 infection (fever, chills, cough, or new SHORTNESS OF BREATH).    Prior CV studies:   The following studies were reviewed today:  Echocardiogram, chart review  PMHx:  Past Medical History:  Diagnosis Date   CAD (coronary artery disease), 12/29/13 PCI/DES LCX and PCI/DES LAD with overlapping  DES  12/30/2013   cath 07/05/14 [place]   Dyslipidemia, goal LDL below 70 12/30/2013   GERD (gastroesophageal reflux disease)    Metabolic syndrome, HgbA1C 6.0  12/30/2013   Sleep apnea    by history   Smoker     Past Surgical History:  Procedure Laterality Date   CARDIAC CATHETERIZATION  07/05/14   patent stents   CORONARY ANGIOPLASTY WITH STENT PLACEMENT  12/29/13     DES to LCX and overlapping stents DES mid LAD   CORONARY/GRAFT ACUTE MI REVASCULARIZATION N/A 01/31/2019   Procedure: Coronary/Graft Acute MI Revascularization;  Surgeon: Kathleene HazelMcAlhany, Christopher D, MD;  [facility];  Service: Cardiovascular;  Laterality: N/A;   LEFT HEART CATH AND CORONARY ANGIOGRAPHY N/A 01/31/2019   Procedure: LEFT HEART CATH AND CORONARY ANGIOGRAPHY;  Surgeon: Kathleene HazelMcAlhany, Christopher D, MD;  Location: [facility]AS[facility] LAB;  Service: Cardiovascular;  Laterality: N/A;   LEFT HEART CATHETERIZATION WITH CORONARY ANGIOGRAM N/A 12/29/2013   Procedure: LEFT HEART CATHETERIZATION WITH CORONARY ANGIOGRAM;  Surgeon: Lesleigh NoeHenry W Smith III, MD;  [facility];  Service: Cardiovascular;  Laterality: N/A;   LEFT HEART CATHETERIZATION WITH CORONARY ANGIOGRAM N/A 07/05/2014   Procedure: LEFT HEART CATHETERIZATION WITH CORONARY ANGIOGRAM;  Surgeon: Peter M SwazilandJordan, MD;  [facility];  Service: Cardiovascular;  Laterality: N/A;   PERCUTANEOUS CORONARY STENT INTERVENTION (PCI-S)  12/29/2013   Procedure: PERCUTANEOUS CORONARY STENT INTERVENTION (PCI-S);  Surgeon: Lesleigh NoeHenry W Smith III, MD;  [facility];  Service: Cardiovascular;;    FAMHx:  Family History  Problem Relation Age of Onset   Heart attack Brother        Deceased   Heart attack Brother    Stroke Sister    Diabetes Father        also heart disease   Cancer Father        Deceased   Diabetes Mother    Cancer Mother        Deceased    SOCHx:   reports that he has been smoking cigarettes. He has a 30.00 pack-year smoking history. He has never used smokeless tobacco. He reports that he does not drink alcohol or use drugs.  ALLERGIES:  No Known Allergies  MEDS:  Current Meds  Medication Sig   aspirin EC 81 MG EC tablet Take 1 tablet (81 mg total) by mouth daily.   atorvastatin (LIPITOR) 80 MG tablet Take 1 tablet (80 mg total) by mouth daily.   carvedilol (COREG) 3.125 MG tablet  Take 1 tablet (3.125 mg total) by mouth 2 (two) times daily with a meal.   nicotine (NICODERM CQ - DOSED IN MG/24 HOURS) 21 mg/24hr patch Place 21 mg onto the skin daily.   nitroGLYCERIN (NITROSTAT) 0.4 MG SL tablet Place 1 tablet (0.4 mg total) under the tongue every 5 (five) minutes x 3 doses as needed for chest pain.   pantoprazole (PROTONIX) 40 MG tablet TAKE 1 TABLET BY MOUTH EVERY DAY   spironolactone (ALDACTONE) 25 MG tablet Take 0.5 tablets (12.5 mg total) by mouth daily.   ticagrelor (BRILINTA) 90 MG TABS tablet Take 1 tablet (90 mg total) by mouth 2 (two) times daily.   [DISCONTINUED] losartan (COZAAR) 25 MG tablet Take 1 tablet (25 mg total) by mouth at bedtime.     ROS: Pertinent items noted in HPI and remainder of comprehensive ROS otherwise negative.  Labs/Other Tests and Data Reviewed:    Recent Labs: 01/31/2019: TSH 1.793 02/01/2019: Magnesium 1.8 05/19/2019:  ALT 22; BUN 21; Creatinine, Ser 1.16; Hemoglobin 13.9; Platelets 262; Potassium 4.8; Sodium 141   Recent Lipid Panel Lab Results  Component Value Date/Time   CHOL 114 05/19/2019 09:08 AM   TRIG 128 05/19/2019 09:08 AM   HDL 28 (L) 05/19/2019 09:08 AM   CHOLHDL 4.1 05/19/2019 09:08 AM   CHOLHDL 4.3 08/10/2014 08:10 AM   LDLCALC 60 05/19/2019 09:08 AM    Wt Readings from Last 3 Encounters:  06/10/19 208 lb (94.3 kg)  05/19/19 207 lb (93.9 kg)  02/09/19 205 lb (93 kg)     Exam:    Vital Signs:  BP 108/73    Pulse 76    Wt 208 lb (94.3 kg)    BMI 28.21 kg/m    Exam not performed due to telephone visit  ASSESSMENT & PLAN:    1. Acute on chronic systolic congestive heart failure with anterior and anteroseptal scar and possible aneurysm, LVEF 25 to 30%, NYHA class III symptoms (06/2019) 2. Coronary artery disease status post two-vessel PCI to the left circumflex and LAD (2015) -status post acute anterior STEMI with thrombotic occlusion of the LAD, noncompliant with medications at the time, status post  balloon angioplasty 3. Dyslipidemia on statin 4. Tobacco abuse - working on quitting 5. Anxiety  Alan White has had a further decline in LVEF now down to 25 to 30%, this is somewhat out of proportion to his recent occluded LAD.  I suspect he had an upstream plaque rupture that led to thrombotic occlusion of the stent rather than being related to noncompliance with medications although he was on no antiplatelet therapy.  I had a ready taken him off of Plavix prior to that since he had completed a year of therapy back in 2016.  The progressive decline in LVEF suggest a mixed ischemic and nonischemic cardiomyopathy.  Because there is evidence for scar, I doubt his LVEF will ever normalize however perhaps with medical therapy we can improve the EF greater than 35%.  I recommend discontinuing his losartan today and switching him to Entresto 24/26 mg twice daily today.  I advised him to monitor his blood pressure closely.  He will remain on carvedilol and Aldactone.  We will likely repeat an echocardiogram in about 6 months.  He has follow-up with me in 2 months which time we may uptitrate his medication if it is tolerated.  If LVEF has not improved with medical therapy at 6 months, then referral for AICD may be necessary.  COVID-19 Education: The signs and symptoms of COVID-19 were discussed with the patient and how to seek care for testing (follow up with PCP or arrange E-visit).  The importance of social distancing was discussed today.  Patient Risk:   After full review of this patients clinical status, I feel that they are at least moderate risk at this time.  Time:   Today, I have spent 35 minutes with the patient with telehealth technology discussing acute systolic congestive heart failure, dyslipidemia, tobacco cessation, risk of sudden cardiac death, heart failure management.     Medication Adjustments/Labs and Tests Ordered: Current medicines are reviewed at length with the patient today.   Concerns regarding medicines are outlined above.   Tests Ordered: No orders of the defined types were placed in this encounter.   Medication Changes: Meds ordered this encounter  Medications   sacubitril-valsartan (ENTRESTO) 24-26 MG    Sig: Take 1 tablet by mouth 2 (two) times daily.    Dispense:  60  tablet    Refill:  11    Disposition:  in 2 month(s)  Chrystie NoseKenneth C. Anuja Manka, MD, [facility]. Geddy Jr[facility] Outpatient CenterFACC, FACP  [place]   [facility]  Medical Director of the Advanced Lipid Disorders &  Cardiovascular Risk Reduction Clinic Diplomate of the American Board of Clinical Lipidology Attending Cardiologist  Direct Dial: [phone]   Fax: [phone]  Website:  [email or web address]  Chrystie NoseKenneth C Kamsiyochukwu Buist, MD  06/10/2019 9:43 AM

## 2019-06-10 NOTE — Patient Instructions (Addendum)
Medication Instructions:  STOP losartan START entresto 24/26mg  twice daily  Co-pay card: [email or web address]  If you need a refill on your cardiac medications before your next appointment, please call your pharmacy.   Follow-Up: As scheduled with Dr. Debara Pickett November 19 @ 8am

## 2019-06-10 NOTE — Telephone Encounter (Signed)
PA for entresto submitted via [email or web address]  (Key: [id number])

## 2019-06-15 NOTE — Telephone Encounter (Signed)
entresto has been approved until 06/09/2020 under [employer] coverage plan

## 2019-06-22 ENCOUNTER — Other Ambulatory Visit: Payer: Self-pay | Admitting: Cardiovascular Disease

## 2019-07-20 DIAGNOSIS — Z79899 Other long term (current) drug therapy: Secondary | ICD-10-CM

## 2019-07-21 DIAGNOSIS — Z79899 Other long term (current) drug therapy: Secondary | ICD-10-CM | POA: Diagnosis not present

## 2019-07-21 LAB — BASIC METABOLIC PANEL
BUN/Creatinine Ratio: 15 (ref 9–20)
BUN: 17 mg/dL (ref 6–24)
CO2: 24 mmol/L (ref 20–29)
Calcium: 9.6 mg/dL (ref 8.7–10.2)
Chloride: 104 mmol/L (ref 96–106)
Creatinine, Ser: 1.13 mg/dL (ref 0.76–1.27)
GFR calc Af Amer: 82 mL/min/{1.73_m2} (ref >59)
GFR calc non Af Amer: 71 mL/min/{1.73_m2} (ref >59)
Glucose: 100 mg/dL — ABNORMAL HIGH (ref 65–99)
Potassium: 4.5 mmol/L (ref 3.5–5.2)
Sodium: 142 mmol/L (ref 134–144)

## 2019-08-10 MED ORDER — SPIRONOLACTONE 25 MG PO TABS: 12.5000 mg | ORAL_TABLET | ORAL | 6 refills | Status: DC

## 2019-08-10 NOTE — Telephone Encounter (Signed)
Medication list updated to reflect change in medication recommendation(s)

## 2019-08-20 ENCOUNTER — Other Ambulatory Visit: Payer: Self-pay

## 2019-08-20 ENCOUNTER — Ambulatory Visit (INDEPENDENT_AMBULATORY_CARE_PROVIDER_SITE_OTHER): Payer: BC Managed Care – PPO | Admitting: Internal Medicine

## 2019-08-20 ENCOUNTER — Encounter: Payer: Self-pay | Admitting: Internal Medicine

## 2019-08-20 VITALS — BP 111/78 | HR 66 | Temp 97.0°F | Ht 72.0 in | Wt 209.0 lb

## 2019-08-20 DIAGNOSIS — I5022 Chronic systolic (congestive) heart failure: Secondary | ICD-10-CM

## 2019-08-20 DIAGNOSIS — I2510 Atherosclerotic heart disease of native coronary artery without angina pectoris: Secondary | ICD-10-CM | POA: Diagnosis not present

## 2019-08-20 DIAGNOSIS — E785 Hyperlipidemia, unspecified: Secondary | ICD-10-CM | POA: Diagnosis not present

## 2019-08-20 DIAGNOSIS — I255 Ischemic cardiomyopathy: Secondary | ICD-10-CM

## 2019-08-20 NOTE — Progress Notes (Signed)
Alan White    OFFICE NOTE  Chief Complaint:  No complaints  Primary Care Physician: Joycelyn Rua, MD  HPI:  Alan White is a 58 y.o. male with no history of CAD. CRFs are tobacco, FH. No known history of HTN, HL, DM. He presented 12/28/13 with a 4 day history of intermittent chest pressure. It was left substernal, radiating to both arms. It was 8/10 at its worst. It had occurred at rest and with exertion. He tried OTC PPI and Tums, without relief. One time, he took ASA which helped. It seems to happen more often in the afternoon/evening. It has also woken him from sleep. He had SOB with it it if occurred with exertion, but also describes DOE. He has had no N&V or diaphoresis. He told his wife about it 3 days ago, has had multiple episodes. He took a day off from work and came to the ER because it was scaring him. He was admitted by Dr. Rennis Golden with addition of IV heparin and NTG. Cardiac enzymes were negative- exceptfor 0.11 of troponin marker. Pt underwent cardiac cath with findings of:   1. Severe proximal to mid LAD and mid circumflex stenoses as outlined above.  2. Normal right coronary artery.  3. Wall motion abnormality involving the inferoapical wall. Preserved LVEF at 50-55%  4. Successful circumflex DES implantation post dilated to 3.75 mm in diameter and an aneurysmal portion of the vessel. 0% stenosis was noted post deployment.  5. Successful deployment of overlapping stents in the proximal to mid LAD from 99% to 0% with TIMI grade 3 flow. Postdilatation diameter was 3.0   Dual antiplatelet therapy for greater than 12 months. Brilinta can be switched to Plavix at 6 months.   Pt did well post procedure. EKG with incomplete RBBB, t wave inversions in ant lat leads. Was seen by Dr. Swaziland and found stable for discharge. Will ambulate with cardiac rehab first. Have called [facility] to arrange new pt appointment, they are to call pt.   Other issues borderline diabetes will have  dietician see before discharge. Pt on statin, BB and asa, Brilinta. BP is borderline, ACE will be added as outpatient. No work until  01/04/14.  Alan White had been reporting very similar left chest discomfort that he had prior to his original stents. He reported a soreness under the left breast which improved with elevating the left breast. This is very atypical however seem to improve significantly after having his stents placed. He said that he felt great for about 3 months after having stents placed but is since become more short of breath and continues to have chest discomfort. He presented the hospital with similar complaints in early October and underwent a repeat cardiac catheterization which showed widely patent stents. He was discharged without changes in his medication. He comes back today with again similar complaints in the left chest. Unclear whether this is angina or not. He may have had some improvement with a nitrate. Again he is describing some shortness of breath.  Alan White returns today and reports she is feeling the best he has in years. He took his isosorbide for about a week and then had awful headaches and discontinued it. He did notice a change during that week and improvement in his chest pain symptoms, however. He also was switched from Brilinta to Plavix and seems to be tolerating this well. His P2 Y 12 assay indicated 221,which is adequate platelet suppression.  I saw Alan White back in the  office today. Overall he tends to feel fatigued at times. He said initially it felt the best that he had in years after his PCI but now seems to tired fairly easily. He was ordered for sleep study however that has not yet been performed. He also has some significant anxiety and continues to smoke. He's been very difficult for him to quit smoking. I do believe anxiety is playing a role in this. He denies anymore cardiac chest pain.  Alan White returns today for follow-up. Overall he is feeling  excellent today. He denies any chest pain or shortness of breath. He's been dealing with a kidney stone but he seeing a urologist for this. He has significantly cut back his smoking down to 5 cigarettes a day. He is not started Zyban, but he has filled the prescription and said that he would start the medicine if he cannot stop smoking on his own.  05/29/2018  Alan White is seen today in follow-up.  He denies any chest pain or worsening shortness of breath.  He recently saw Alan Course, PA-C in February 2019.  He is complaining of some leg pain and underwent venous Dopplers which were negative for DVT.  He had some associated edema.  That is resolved.  He denies any worsening chest pain.  He has not had any lab work in the past couple of years.  08/20/2019  Alan White is seen today in follow-up.  I saw him recently for virtual visit.  He had interim decline in LVEF down to 25 to 30% with severe akinesis of the mid apical anteroseptal wall lateral wall and apical septum.  It was felt that there might be some nonischemic and ischemic mixed cardiomyopathy.  I did switch him over to [facility] and he is on spironolactone.  Unfortunately he had hypotension with this and was feeling very fatigued.  His spironolactone was then decreased to 12.5 mg every other day.  After doing this his blood pressure has come up now into the low 100s and he feels much better.  He is currently not working and is out until February on short-term disability.  PMHx:  Past Medical History:  Diagnosis Date  . CAD (coronary artery disease), 12/29/13 PCI/DES LCX and PCI/DES LAD with overlapping DES  12/30/2013   cath 07/05/14 [place]  . Dyslipidemia, goal LDL below 70 12/30/2013  . GERD (gastroesophageal reflux disease)   . Metabolic syndrome, HgbA1C 6.0  12/30/2013  . Sleep apnea    by history  . Smoker     Past Surgical History:  Procedure Laterality Date  . CARDIAC CATHETERIZATION  07/05/14   patent stents  . CORONARY ANGIOPLASTY WITH  STENT PLACEMENT  12/29/13   DES to LCX and overlapping stents DES mid LAD  . CORONARY/GRAFT ACUTE MI REVASCULARIZATION N/A 01/31/2019   Procedure: Coronary/Graft Acute MI Revascularization;  Surgeon: Kathleene Hazel, MD;  [facility];  Service: Cardiovascular;  Laterality: N/A;  . LEFT HEART CATH AND CORONARY ANGIOGRAPHY N/A 01/31/2019   Procedure: LEFT HEART CATH AND CORONARY ANGIOGRAPHY;  Surgeon: Kathleene Hazel, MD;  [facility];  Service: Cardiovascular;  Laterality: N/A;  . LEFT HEART CATHETERIZATION WITH CORONARY ANGIOGRAM N/A 12/29/2013   Procedure: LEFT HEART CATHETERIZATION WITH CORONARY ANGIOGRAM;  Surgeon: [name], MD;  Location[facility] C[facility] LAB;  Service: Cardiovascular;  Laterality: N/A;  . LEFT HEART CATHETERIZATION WITH CORONARY ANGIOGRAM N/A 07/05/2014   Procedure: LEFT HEART CATHETERIZATION WITH CORONARY ANGIOGRAM;  Surgeon: Peter M Swaziland, MD;  Location: [facility] C[facility] LAB;  Service: Cardiovascular;  Laterality: N/A;  . PERCUTANEOUS CORONARY STENT INTERVENTION (PCI-S)  12/29/2013   Procedure: PERCUTANEOUS CORONARY STENT INTERVENTION (PCI-S);  Surgeon: [name], MD;  Location[facility];  Service: Cardiovascular;;    FAMHx:  Family History  Problem Relation Age of Onset  . Heart attack Brother        Deceased  . Heart attack Brother   . Stroke Sister   . Diabetes Father        also heart disease  . Cancer Father        Deceased  . Diabetes Mother   . Cancer Mother        Deceased    SOCHx:   reports that he has been smoking cigarettes. He has a 30.00 pack-year smoking history. He has never used smokeless tobacco. He reports that he does not drink alcohol or use drugs.  ALLERGIES:  No Known Allergies  ROS: A comprehensive review of systems was negative.  HOME MEDS: Current Outpatient Medications  Medication Sig Dispense Refill  . aspirin EC 81 MG EC tablet Take 1 tablet (81 mg total) by mouth daily. 30  tablet 11  . atorvastatin (LIPITOR) 80 MG tablet Take 1 tablet (80 mg total) by mouth daily. 30 tablet 6  . carvedilol (COREG) 3.125 MG tablet Take 1 tablet (3.125 mg total) by mouth 2 (two) times daily with a meal. 60 tablet 8  . nicotine (NICODERM CQ - DOSED IN MG/24 HOURS) 21 mg/24hr patch Place 21 mg onto the skin daily.    . nitroGLYCERIN (NITROSTAT) 0.4 MG SL tablet Place 1 tablet (0.4 mg total) under the tongue every 5 (five) minutes x 3 doses as needed for chest pain. 25 tablet 12  . pantoprazole (PROTONIX) 40 MG tablet TAKE 1 TABLET BY MOUTH EVERY DAY 30 tablet 8  . sacubitril-valsartan (ENTRESTO) 24-26 MG Take 1 tablet by mouth 2 (two) times daily. 60 tablet 11  . spironolactone (ALDACTONE) 25 MG tablet Take 0.5 tablets (12.5 mg total) by mouth every other day. 30 tablet 6  . ticagrelor (BRILINTA) 90 MG TABS tablet Take 1 tablet (90 mg total) by mouth 2 (two) times daily. 60 tablet 11   No current facility-administered medications for this visit.     LABS/IMAGING: No results found for this or any previous visit (from the past 48 hour(s)). No results found.  VITALS: BP 111/78   Pulse 66   Temp (!) 97 F (36.1 C)   Ht 6' (1.829 m)   Wt 209 lb (94.8 kg)   SpO2 99%   BMI 28.35 kg/m   EXAM: General appearance: alert and no distress Neck: no carotid bruit, no JVD and thyroid not enlarged, symmetric, no tenderness/mass/nodules Lungs: clear to auscultation bilaterally Heart: regular rate and rhythm, S1, S2 normal, no murmur, click, rub or gallop Abdomen: soft, non-tender; bowel sounds normal; no masses,  no organomegaly Extremities: extremities normal, atraumatic, no cyanosis or edema Pulses: 2+ and symmetric Skin: Skin color, texture, turgor normal. No rashes or lesions Neurologic: Grossly normal Psych: Pleasant  EKG: Sinus rhythm with first-degree AV block at 70, low voltage QRS-personally reviewed  ASSESSMENT: 1. Coronary artery disease status post two-vessel PCI to  the left circumflex and LAD (2015) 2. Dyslipidemia on statin 3. Tobacco abuse - working on quitting 4. Anxiety 5. Mixed ischemic and nonischemic cardiomyopathy EF 25 to 30%, NYHA class III symptoms  PLAN: 1.  Mr. Siebert had interval decline in LVEF down to 25 to 30%.  He is now on low-dose Entresto but cannot tolerate higher doses due to hypotension.  Spironolactone was reduced and moved to every other day dosing which is helped with his blood pressure.  He now feels better and has more class II heart failure symptoms.  We will plan to recheck his LV function in January.  He is scheduled to come off for short-term disability in February, possibly depending on how he feels.  At that point if his LVEF has not recovered greater than 35%, would strongly consider referral for AICD.  Pixie Casino, MD, [facility], [place] Director of the Advanced Lipid Disorders &  Cardiovascular Risk Reduction Clinic Diplomate of the American Board of Clinical Lipidology Attending Cardiologist  Direct Dial: [phone]  Fax: [phone]  Website:  [email or web address] Jacquel Mccamish 08/20/2019, 8:07 AM

## 2019-08-20 NOTE — Patient Instructions (Signed)
Medication Instructions:  Your physician recommends that you continue on your current medications as directed. Please refer to the Current Medication list given to you today.  *If you need a refill on your cardiac medications before your next appointment, please call your pharmacy*  Lab Work: NONE If you have labs (blood work) drawn today and your tests are completely normal, you will receive your results only by: Marland Kitchen MyChart Message (if you have MyChart) OR . A paper copy in the mail If you have any lab test that is abnormal or we need to change your treatment, we will call you to review the results.  Testing/Procedures: Limited Echo in Jan 2021  Follow-Up: At [facility], you and your health needs are our priority.  As part of our continuing mission to provide you with exceptional heart care, we have created designated Provider Care Teams.  These Care Teams include your primary Cardiologist (physician) and Advanced Practice Providers (APPs -  Physician Assistants and Nurse Practitioners) who all work together to provide you with the care you need, when you need it.  Your next appointment:   January 2021 after echo  The format for your next appointment:   In Person  Provider:   K. Mali Hilty, MD  Other Instructions

## 2019-09-01 ENCOUNTER — Other Ambulatory Visit: Payer: Self-pay | Admitting: Physician Assistant

## 2019-09-16 ENCOUNTER — Ambulatory Visit (INDEPENDENT_AMBULATORY_CARE_PROVIDER_SITE_OTHER): Payer: BC Managed Care – PPO | Admitting: Cardiology

## 2019-09-16 ENCOUNTER — Other Ambulatory Visit: Payer: Self-pay

## 2019-09-16 ENCOUNTER — Telehealth: Payer: Self-pay | Admitting: Internal Medicine

## 2019-09-16 ENCOUNTER — Encounter: Payer: Self-pay | Admitting: Cardiology

## 2019-09-16 VITALS — BP 102/70 | HR 65 | Temp 97.2°F | Ht 72.0 in | Wt 210.0 lb

## 2019-09-16 DIAGNOSIS — I255 Ischemic cardiomyopathy: Secondary | ICD-10-CM

## 2019-09-16 DIAGNOSIS — I2510 Atherosclerotic heart disease of native coronary artery without angina pectoris: Secondary | ICD-10-CM

## 2019-09-16 DIAGNOSIS — R0789 Other chest pain: Secondary | ICD-10-CM | POA: Diagnosis not present

## 2019-09-16 DIAGNOSIS — R079 Chest pain, unspecified: Secondary | ICD-10-CM | POA: Diagnosis not present

## 2019-09-16 DIAGNOSIS — Z9861 Coronary angioplasty status: Secondary | ICD-10-CM

## 2019-09-16 MED ORDER — NITROGLYCERIN 0.2 MG/HR TD PT24: 0.2000 mg | MEDICATED_PATCH | Freq: Every day | TRANSDERMAL | 3 refills | Status: DC

## 2019-09-16 NOTE — Assessment & Plan Note (Addendum)
EF 25% Sept 2020-medical therapy limited by hypotension. F/U echo ordered for Jan 2021

## 2019-09-16 NOTE — Telephone Encounter (Signed)
Spoke with pt and over the last 4-5 days has been burping a lot pressure goes away for maybe a couple of hours and then returns and needs to keep "belching" Per pt does note heart is "sore" Per pt had indigestion with MI along with arm discomfort Pt feels this is different but has concerns re the heart feeling sore. Appt made for today with Kerin Ransom PA.

## 2019-09-16 NOTE — Patient Instructions (Signed)
Medication Instructions:  START Nitroderm Patch wear one patch once a day *If you need a refill on your cardiac medications before your next appointment, please call your pharmacy*  Lab Work: None  If you have labs (blood work) drawn today and your tests are completely normal, you will receive your results only by: Marland Kitchen MyChart Message (if you have MyChart) OR . A paper copy in the mail If you have any lab test that is abnormal or we need to change your treatment, we will call you to review the results.  Testing/Procedures: None   Follow-Up: At [facility], you and your health needs are our priority.  As part of our continuing mission to provide you with exceptional heart care, we have created designated Provider Care Teams.  These Care Teams include your primary Cardiologist (physician) and Advanced Practice Providers (APPs -  Physician Assistants and Nurse Practitioners) who all work together to provide you with the care you need, when you need it.  Your next appointment:   FOLLOW UP AS SCHEDULED  The format for your next appointment:   In Person  Provider:   K. Mali Hilty, MD  Other Instructions

## 2019-09-16 NOTE — Telephone Encounter (Signed)
Pt c/o of Chest Pain: STAT if CP now or developed within 24 hours  1. Are you having CP right now?  Yes   2. Are you experiencing any other symptoms (ex. SOB, nausea, vomiting, sweating)? Indigestion   3. How long have you been experiencing CP?  Past week   4. Is your CP continuous or coming and going?  Continuous   5. Have you taken Nitroglycerin?  No   Patient states it is more of a soreness/aching feeling than actual chest pain. It seems to go away when he stands up & burps, but it stays continuous when he is sitting.

## 2019-09-16 NOTE — Progress Notes (Signed)
Cardiology Office Note:    Date:  09/16/2019   ID:  Alan White, DOB [birth year], MRN [id number]  PCP:  Joycelyn Rua, MD  Cardiologist:  Chrystie Nose, MD  Electrophysiologist:  None   Referring MD: Joycelyn Rua, MD   CC: Chest pain  History of Present Illness:    Alan White is a 58 y.o. male with a hx of CAD and ICM.  He had an intervention to the circumflex and LAD in 2015.  Ejection fraction was normal at that time.  He presented emergency room 01/31/2019 with chest pain, he had apparently stopped all his medications.  He underwent urgent catheterization 01/31/2019 which showed an occluded proximal LAD which was treated with angioplasty and a patent left circumflex stent.  Ejection fraction was 35 to 40% with apical ballooning consistent with Takotsubo cardiomyopathy.  He had anterior T wave inversion on his EKG.  Follow-up echocardiogram done June 02, 2019 showed an ejection fraction of 25 to 30%.  He was placed on medical therapy but hypotension has limited adjustment.  Dr. Rennis Golden saw him in the office August 20, 2019 and backed off on his Aldactone.  The patient tells me he felt better after this although he has had "soreness over my heart" for 3 days.  The symptoms of been constant and not worsened with activity.  He is not taken any nitroglycerin for this.  Symptoms are present pretty much all the time for the past 3 days.  Became concerned with his history and wanted to be checked out.  Past Medical History:  Diagnosis Date  . CAD (coronary artery disease), 12/29/13 PCI/DES LCX and PCI/DES LAD with overlapping DES  12/30/2013   cath 07/05/14 [place]  . Dyslipidemia, goal LDL below 70 12/30/2013  . GERD (gastroesophageal reflux disease)   . Metabolic syndrome, HgbA1C 6.0  12/30/2013  . Sleep apnea    by history  . Smoker     Past Surgical History:  Procedure Laterality Date  . CARDIAC CATHETERIZATION  07/05/14   patent stents  . CORONARY ANGIOPLASTY WITH STENT PLACEMENT   12/29/13   DES to LCX and overlapping stents DES mid LAD  . CORONARY/GRAFT ACUTE MI REVASCULARIZATION N/A 01/31/2019   Procedure: Coronary/Graft Acute MI Revascularization;  Surgeon: Kathleene Hazel, MD;  Location: [facility] INVAS[facility] LAB;  Service: Cardiovascular;  Laterality: N/A;  . LEFT HEART CATH AND CORONARY ANGIOGRAPHY N/A 01/31/2019   Procedure: LEFT HEART CATH AND CORONARY ANGIOGRAPHY;  Surgeon: Kathleene Hazel, MD;  Location: [facility] INVASIVE CV LAB;  Service: Cardiovascular;  Laterality: N/A;  . LEFT HEART CATHETERIZATION WITH CORONARY ANGIOGRAM N/A 12/29/2013   Procedure: LEFT HEART CATHETERIZATION WITH CORONARY ANGIOGRAM;  Surgeon: [name], MD;  Location[facility] C[facility] LAB;  Service: Cardiovascular;  Laterality: N/A;  . LEFT HEART CATHETERIZATION WITH CORONARY ANGIOGRAM N/A 07/05/2014   Procedure: LEFT HEART CATHETERIZATION WITH CORONARY ANGIOGRAM;  Surgeon: Peter M Swaziland, MD;  Location: [facility] CATH LAB;  Service: Cardiovascular;  Laterality: N/A;  . PERCUTANEOUS CORONARY STENT INTERVENTION (PCI-S)  12/29/2013   Procedure: PERCUTANEOUS CORONARY STENT INTERVENTION (PCI-S);  Surgeon: [name], MD;  Location: [facility] CATH LAB;  Service: Cardiovascular;;    Current Medications: Current Meds  Medication Sig  . aspirin EC 81 MG EC tablet Take 1 tablet (81 mg total) by mouth daily.  Marland Kitchen atorvastatin (LIPITOR) 80 MG tablet TAKE 1 TABLET BY MOUTH EVERY DAY  . carvedilol (COREG) 3.125 MG tablet Take 1 tablet (3.125 mg total) by  mouth 2 (two) times daily with a meal.  . nicotine (NICODERM CQ - DOSED IN MG/24 HOURS) 21 mg/24hr patch Place 21 mg onto the skin daily.  . nitroGLYCERIN (NITROSTAT) 0.4 MG SL tablet Place 1 tablet (0.4 mg total) under the tongue every 5 (five) minutes x 3 doses as needed for chest pain.  . pantoprazole (PROTONIX) 40 MG tablet TAKE 1 TABLET BY MOUTH EVERY DAY  . sacubitril-valsartan (ENTRESTO) 24-26 MG Take 1 tablet by mouth 2 (two) times daily.  Marland Kitchen spironolactone  (ALDACTONE) 25 MG tablet Take 0.5 tablets (12.5 mg total) by mouth every other day.  . ticagrelor (BRILINTA) 90 MG TABS tablet Take 1 tablet (90 mg total) by mouth 2 (two) times daily.     Allergies:   Patient has no known allergies.   Social History   Socioeconomic History  . Marital status: Married    Spouse name:    . Number of children: Not on file  . Years of education: Not on file  . Highest education level: Not on file  Occupational History  . Occupation: Best boy: [employer]  Tobacco Use  . Smoking status: Current Every Day Smoker    Packs/day: 1.00    Years: 30.00    Pack years: 30.00    Types: Cigarettes  . Smokeless tobacco: Never Used  Substance and Sexual Activity  . Alcohol use: No    Comment: "Once in a blue moon"  . Drug use: No  . Sexual activity: Not on file  Other Topics Concern  . Not on file  Social History Narrative   Pt lives with wife.   Social Determinants of Health   Financial Resource Strain:   . Difficulty of Paying Living Expenses: Not on file  Food Insecurity:   . Worried About Charity fundraiser in the Last Year: Not on file  . Ran Out of Food in the Last Year: Not on file  Transportation Needs:   . Lack of Transportation (Medical): Not on file  . Lack of Transportation (Non-Medical): Not on file  Physical Activity:   . Days of Exercise per Week: Not on file  . Minutes of Exercise per Session: Not on file  Stress:   . Feeling of Stress : Not on file  Social Connections:   . Frequency of Communication with Friends and Family: Not on file  . Frequency of Social Gatherings with Friends and Family: Not on file  . Attends Religious Services: Not on file  . Active Member of Clubs or Organizations: Not on file  . Attends Archivist Meetings: Not on file  . Marital Status: Not on file     Family History: The patient's family history includes Cancer in his father and mother; Diabetes in his father and  mother; Heart attack in his brother and brother; Stroke in his sister.  ROS:   Please see the history of present illness.  All other systems reviewed and are negative.  EKGs/Labs/Other Studies Reviewed:    The following studies were reviewed today: Cath/PCI 01/31/2019  EKG:  EKG is ordered today.  The ekg ordered today demonstrates NSR-^%, anterior Qs and TWI- no change  Recent Labs: 01/31/2019: TSH 1.793 02/01/2019: Magnesium 1.8 05/19/2019: ALT 22; Hemoglobin 13.9; Platelets 262 07/21/2019: BUN 17; Creatinine, Ser 1.13; Potassium 4.5; Sodium 142  Recent Lipid Panel    Component Value Date/Time   CHOL 114 05/19/2019 0908   TRIG 128 05/19/2019 0908  HDL 28 (L) 05/19/2019 0908   CHOLHDL 4.1 05/19/2019 0908   CHOLHDL 4.3 08/10/2014 0810   VLDL 27 08/10/2014 0810   LDLCALC 60 05/19/2019 0908    Physical Exam:    VS:  BP 102/70   Pulse 65   Temp (!) 97.2 F (36.2 C) (Temporal)   Ht 6' (1.829 m)   Wt 210 lb (95.3 kg)   BMI 28.48 kg/m     Wt Readings from Last 3 Encounters:  09/16/19 210 lb (95.3 kg)  08/20/19 209 lb (94.8 kg)  06/10/19 208 lb (94.3 kg)     GEN: Well nourished, well developed in no acute distress HEENT: Normal NECK: No JVD; No carotid bruits CARDIAC: RRR, no murmurs, rubs, gallops RESPIRATORY:  Clear to auscultation without rales, wheezing or rhonchi  ABDOMEN: Soft, non-tender, non-distended MUSCULOSKELETAL:  No edema; No deformity  SKIN: Warm and dry NEUROLOGIC:  Alert and oriented x 3 PSYCHIATRIC:  Normal affect   ASSESSMENT:    Chest pain of uncertain etiology Symptoms are different than his pre PCI symptoms.  Try low dose Nitro Dur patch secondary to hypotension.   Ischemic cardiomyopathy EF 25% Sept 2020-medical therapy limited by hypotension. F/U echo ordered for Jan 2021  CAD- S/P PCI  12/29/13 PCI/DES CFX and LAD- patent 07/05/14 01/31/2019- LAD ISR after he stopped his medications- treated with POBA, CFX DES was patent.  PLAN:     Try low dose Nitro Dur patch.  Keep f/u with Dr Rennis Golden after echo in Jan as scheduled.   Medication Adjustments/Labs and Tests Ordered: Current medicines are reviewed at length with the patient today.  Concerns regarding medicines are outlined above.  Orders Placed This Encounter  Procedures  . EKG 12-Lead   Meds ordered this encounter  Medications  . nitroGLYCERIN (NITRODUR - DOSED IN MG/24 HR) 0.2 mg/hr patch    Sig: Place 1 patch (0.2 mg total) onto the skin daily.    Dispense:  30 patch    Refill:  3    Patient Instructions  Medication Instructions:  START Nitroderm Patch wear one patch once a day *If you need a refill on your cardiac medications before your next appointment, please call your pharmacy*  Lab Work: None  If you have labs (blood work) drawn today and your tests are completely normal, you will receive your results only by: Marland Kitchen MyChart Message (if you have MyChart) OR . A paper copy in the mail If you have any lab test that is abnormal or we need to change your treatment, we will call you to review the results.  Testing/Procedures: None   Follow-Up: At [facility], you and your health needs are our priority.  As part of our continuing mission to provide you with exceptional heart care, we have created designated Provider Care Teams.  These Care Teams include your primary Cardiologist (physician) and Advanced Practice Providers (APPs -  Physician Assistants and Nurse Practitioners) who all work together to provide you with the care you need, when you need it.  Your next appointment:   FOLLOW UP AS SCHEDULED  The format for your next appointment:   In Person  Provider:   K. [place] Hilty, MD  Other Instructions     Signed, Corine Shelter, PA-C  09/16/2019 12:18 PM    [facility]

## 2019-09-16 NOTE — Assessment & Plan Note (Signed)
12/29/13 PCI/DES CFX and LAD- patent 07/05/14 01/31/2019- LAD ISR after he stopped his medications- treated with POBA, CFX DES was patent.

## 2019-09-16 NOTE — Assessment & Plan Note (Signed)
Symptoms are different than his pre PCI symptoms.  Try low dose Nitro Dur patch secondary to hypotension.

## 2019-09-19 ENCOUNTER — Other Ambulatory Visit: Payer: Self-pay | Admitting: Physician Assistant

## 2019-09-30 ENCOUNTER — Other Ambulatory Visit: Payer: Self-pay

## 2019-09-30 ENCOUNTER — Ambulatory Visit (HOSPITAL_COMMUNITY): Payer: BC Managed Care – PPO | Attending: Cardiology

## 2019-09-30 DIAGNOSIS — I255 Ischemic cardiomyopathy: Secondary | ICD-10-CM | POA: Insufficient documentation

## 2019-10-01 ENCOUNTER — Other Ambulatory Visit: Payer: Self-pay | Admitting: Internal Medicine

## 2019-10-01 DIAGNOSIS — I5022 Chronic systolic (congestive) heart failure: Secondary | ICD-10-CM

## 2019-10-01 DIAGNOSIS — I255 Ischemic cardiomyopathy: Secondary | ICD-10-CM

## 2019-10-05 ENCOUNTER — Other Ambulatory Visit (HOSPITAL_COMMUNITY): Payer: BC Managed Care – PPO

## 2019-10-06 ENCOUNTER — Encounter: Payer: Self-pay | Admitting: Cardiology

## 2019-10-06 ENCOUNTER — Ambulatory Visit: Payer: BC Managed Care – PPO | Admitting: Cardiology

## 2019-10-06 ENCOUNTER — Other Ambulatory Visit: Payer: Self-pay

## 2019-10-06 VITALS — BP 98/64 | HR 78 | Ht 72.0 in | Wt 209.2 lb

## 2019-10-06 DIAGNOSIS — I255 Ischemic cardiomyopathy: Secondary | ICD-10-CM | POA: Diagnosis not present

## 2019-10-06 NOTE — Progress Notes (Signed)
Electrophysiology Office Note   Date:  10/06/2019   ID:  Alan White, DOB 1961-01-22, MRN [id number]  PCP:  Alan Rua, MD  Cardiologist:  Alan White Primary Electrophysiologist:  Alan Genao Jorja Loa, MD    Chief Complaint: CHF   History of Present Illness: Alan White is a 59 y.o. male who is being seen today for the evaluation of CHF at the request of Alan White, Alan Abu, MD. Presenting today for electrophysiology evaluation.  He has a history significant for coronary artery disease status post multiple stents, ischemic cardiomyopathy, hyperlipidemia, obstructive sleep apnea.  He presented to emergency room 01/31/2019 with chest pain after stopping all of his medications.  He underwent left heart cath which showed an occluded proximal LAD.  He received angioplasty.  He had a patent circumflex stent.  Follow-up echo September 2020 showed an ejection fraction 25 to 30%.  Repeat echo shows continued low ejection fraction.  Today, he denies symptoms of palpitations, chest pain, ;ower extremity edema, claudication, dizziness, presyncope, syncope, bleeding, or neurologic sequela. The patient is tolerating medications without difficulties.  Unfortunately, he has days that he feels quite poorly.  He is weak, fatigued, with shortness of breath.  He has not noted lower extremity edema.  He says that when he has his bad days, his blood pressure at home is in the 80s systolic.  When he has his good days, his blood pressure similar to what it is today in the high 90s to low 100s.  On his bad days, he does not feel like being active at all.   Past Medical History:  Diagnosis Date  . CAD (coronary artery disease), 12/29/13 PCI/DES LCX and PCI/DES LAD with overlapping DES  12/30/2013   cath 07/05/14 [place]  . Dyslipidemia, goal LDL below 70 12/30/2013  . GERD (gastroesophageal reflux disease)   . Metabolic syndrome, HgbA1C 6.0  12/30/2013  . Sleep apnea    by history  . Smoker    Past Surgical History:   Procedure Laterality Date  . CARDIAC CATHETERIZATION  07/05/14   patent stents  . CORONARY ANGIOPLASTY WITH STENT PLACEMENT  12/29/13   DES to LCX and overlapping stents DES mid LAD  . CORONARY/GRAFT ACUTE MI REVASCULARIZATION N/A 01/31/2019   Procedure: Coronary/Graft Acute MI Revascularization;  Surgeon: Alan Hazel, MD;  Location: [facility] INVASIVE CV LAB;  Service: Cardiovascular;  Laterality: N/A;  . LEFT HEART CATH AND CORONARY ANGIOGRAPHY N/A 01/31/2019   Procedure: LEFT HEART CATH AND CORONARY ANGIOGRAPHY;  Surgeon: Alan Hazel, MD;  Location: [facility] INVASIVE CV LAB;  Service: Cardiovascular;  Laterality: N/A;  . LEFT HEART CATHETERIZATION WITH CORONARY ANGIOGRAM N/A 12/29/2013   Procedure: LEFT HEART CATHETERIZATION WITH CORONARY ANGIOGRAM;  Surgeon: Alan Noe, MD;  Location: [facility] CATH LAB;  Service: Cardiovascular;  Laterality: N/A;  . LEFT HEART CATHETERIZATION WITH CORONARY ANGIOGRAM N/A 07/05/2014   Procedure: LEFT HEART CATHETERIZATION WITH CORONARY ANGIOGRAM;  Surgeon: Alan M Swaziland, MD;  Location: [facility] CATH LAB;  Service: Cardiovascular;  Laterality: N/A;  . PERCUTANEOUS CORONARY STENT INTERVENTION (PCI-S)  12/29/2013   Procedure: PERCUTANEOUS CORONARY STENT INTERVENTION (PCI-S);  Surgeon: Alan Noe, MD;  Location: [facility] CATH LAB;  Service: Cardiovascular;;     Current Outpatient Medications  Medication Sig Dispense Refill  . aspirin EC 81 MG EC tablet Take 1 tablet (81 mg total) by mouth daily. 30 tablet 11  . atorvastatin (LIPITOR) 80 MG tablet TAKE 1 TABLET BY MOUTH EVERY DAY 90 tablet 1  .  carvedilol (COREG) 3.125 MG tablet Take 1 tablet (3.125 mg total) by mouth 2 (two) times daily with a meal. 60 tablet 8  . nitroGLYCERIN (NITRODUR - DOSED IN MG/24 HR) 0.2 mg/hr patch Place 1 patch (0.2 mg total) onto the skin daily. 30 patch 3  . nitroGLYCERIN (NITROSTAT) 0.4 MG SL tablet Place 1 tablet (0.4 mg total) under the tongue every 5 (five) minutes x 3 doses  as needed for chest pain. 25 tablet 12  . pantoprazole (PROTONIX) 40 MG tablet TAKE 1 TABLET BY MOUTH EVERY DAY 30 tablet 8  . sacubitril-valsartan (ENTRESTO) 24-26 MG Take 1 tablet by mouth 2 (two) times daily. 60 tablet 11  . spironolactone (ALDACTONE) 25 MG tablet Take 0.5 tablets (12.5 mg total) by mouth every other day. 30 tablet 6  . ticagrelor (BRILINTA) 90 MG TABS tablet Take 1 tablet (90 mg total) by mouth 2 (two) times daily. 60 tablet 11   No current facility-administered medications for this visit.    Allergies:   Patient has no known allergies.   Social History:  The patient  reports that he has been smoking cigarettes. He has a 15.00 pack-year smoking history. He has never used smokeless tobacco. He reports that he does not drink alcohol or use drugs.   Family History:  The patient's family history includes Cancer in his father and mother; Diabetes in his father and mother; Heart attack in his brother and brother; Stroke in his sister.    ROS:  Please see the history of present illness.   Otherwise, review of systems is positive for none.   All other systems are reviewed and negative.    PHYSICAL EXAM: VS:  BP 98/64   Pulse 78   Ht 6' (1.829 m)   Wt 209 lb 3.2 oz (94.9 kg)   SpO2 98%   BMI 28.37 kg/m  , BMI Body mass index is 28.37 kg/m. GEN: Well nourished, well developed, in no acute distress  HEENT: normal  Neck: no JVD, carotid bruits, or masses Cardiac: RRR; no murmurs, rubs, or gallops,no edema  Respiratory:  clear to auscultation bilaterally, normal work of breathing GI: soft, nontender, nondistended, + BS MS: no deformity or atrophy  Skin: warm and dry Neuro:  Strength and sensation are intact Psych: euthymic mood, full affect  EKG:  EKG is not ordered today. Personal review of the ekg ordered 09/16/19 shows him, first-degree AV block, rate 65, anteroseptal MI  Recent Labs: 01/31/2019: TSH 1.793 02/01/2019: Magnesium 1.8 05/19/2019: ALT 22; Hemoglobin  13.9; Platelets 262 07/21/2019: BUN 17; Creatinine, Ser 1.13; Potassium 4.5; Sodium 142    Lipid Panel     Component Value Date/Time   CHOL 114 05/19/2019 0908   TRIG 128 05/19/2019 0908   HDL 28 (L) 05/19/2019 0908   CHOLHDL 4.1 05/19/2019 0908   CHOLHDL 4.3 08/10/2014 0810   VLDL 27 08/10/2014 0810   LDLCALC 60 05/19/2019 0908     Wt Readings from Last 3 Encounters:  10/06/19 209 lb 3.2 oz (94.9 kg)  09/16/19 210 lb (95.3 kg)  08/20/19 209 lb (94.8 kg)      Other studies Reviewed: Additional studies/ records that were reviewed today include: TTE 09/30/19  Review of the above records today demonstrates:   1. Left ventricular ejection fraction, by visual estimation, is 25 to 30%. The left ventricle has severely decreased function. There is aneurysmal dilatation of all of the apical segments and akinesis of the mid anteroseptal, anterior, anterolateral  walls. NO  paical thrombus is seen.  2. There is mildly increased left ventricular wall thickness.  3. Left ventricular diastolic parameters are consistent with Grade I diastolic dysfunction (impaired relaxation).  4. The left ventricle demonstrates regional wall motion abnormalities.  5. Left atrial size was mildly dilated.  6. Mild mitral valve regurgitation.  7. Tricuspid valve regurgitation is mild.  8. Tricuspid valve regurgitation is mild.   ASSESSMENT AND PLAN:  1.  Ischemic cardiomyopathy: Ejection fraction 25 to 30%.  We Dashay Giesler thus benefit from an ICD.  Risks and benefits were discussed and include bleeding, tamponade, infection, pneumothorax.  Patient understands these risks and is agreed to the procedure.  Patient is currently on low-dose optimal medical therapy with Entresto, Aldactone, and carvedilol.  We Phillip Sandler touch base with his primary cardiologist about stopping Brilinta and starting him on Plavix.  We Shilo Philipson also discuss with his primary cardiologist his overall medications and his symptoms of heart failure.  2.   Coronary artery disease: No current chest pain.  Continue current management per primary cardiology.  3.  Hyperlipidemia: Statin per primary cardiology  Case discussed with primary cardiology  Current medicines are reviewed at length with the patient today.   The patient does not have concerns regarding his medicines.  The following changes were made today:  none  Labs/ tests ordered today include:  No orders of the defined types were placed in this encounter.    Disposition:   FU with Emanuelle Hammerstrom 3 months  Signed, Bryse Blanchette Jorja Loa, MD  10/06/2019 9:28 AM     [facility] [address] [place] [address] [phone] (office) [phone] (fax)

## 2019-10-06 NOTE — Patient Instructions (Signed)
Medication Instructions:  Your physician recommends that you continue on your current medications as directed. Please refer to the Current Medication list given to you today.     * If you need a refill on your cardiac medications before your next appointment, please call your pharmacy. *   Labwork: Pre procedure lab work today: BMET & CBC w/ diff Your physician recommends that you return for pre procedure lab work in: ________  If you have labs (blood work) drawn today and your tests are completely normal, you will receive your results only by:  [employer] (if you have MyChart) OR  A paper copy in the mail If you have any lab test that is abnormal or we need to change your treatment, we will call you to review the results.   Testing/Procedures: Your physician has recommended that you have a defibrillator inserted. An implantable cardioverter defibrillator (ICD) is a small device that is placed in your chest or, in rare cases, your abdomen. This device uses electrical pulses or shocks to help control life-threatening, irregular heartbeats that could lead the heart to suddenly stop beating (sudden cardiac arrest). Leads are attached to the ICD that goes into your heart. This is done in the hospital and usually requires an overnight stay. Please follow the instructions below, located under the special instructions section.  The nurse will call you to schedule this procedure for the week of February 15-19.   Follow-Up: Your physician recommends that you schedule a wound check appointment 10-14 days, after your procedure on ________, with the device clinic.  Your physician recommends that you schedule a follow up appointment in 91 days, after your procedure on __________, with Dr. Elberta Fortis.  * Please note that any paperwork needing to be filled out by the provider will need to be addressed at the front desk prior to seeing the provider.  Please note that any FMLA, disability or other  documents regarding health condition is subject to a $25.00 charge that must be received prior to completion of paperwork in the form of a money order or check. *  Thank you for choosing [facility]!!   Dory Horn, RN [phone]   Any Other Special Instructions Will Be Listed Below (If Applicable).   Implantable Device Instructions  You are scheduled for: implantable cardiac defibrillator on _______ with Dr. Elberta Fortis.  1.   Pre procedure testing-             A.  LAB WORK--- On __________ you are scheduled to have blood work at the [facility] (see address at the top of this letter) any time after 8:00 am.  You do not need to be fasting.              B. COVID TEST-- On _______ @ _______ Bonita Quin will go to [facility] hospital ([address] [place]) for your Covid testing.   This is a drive thru test site.  There will be multiple testing areas.  Be sure to share with the first checkpoint that you are there for pre-procedure/surgery testing. This will put you into the right (yellow) lane that leads to the PAT testing team.   Stay in your car and the nurse team will come to your car to test you.  After you are tested please go home and self quarantine until the day of your procedure.    2. On the day of your procedure ________ you will go to [facility] hospital [phone]) at ________.  You will go to the main entrance [address] [employer]) and enter where the [employer] parking staff are.  You will check in at ADMITTING.  You may have one support person come in to the hospital with you.  They will be asked to wait in the waiting room.   3.   Do not eat or drink after midnight prior to your procedure.   4.   On the morning of your procedure do NOT take any medication.  5.  The night before your procedure and the morning of your procedure scrub your neck/chest with surgical scrub.  An instruction letter is included with this letter.    5.  Plan for an overnight stay.  If you  use your phone frequently bring your phone charger.  When you are discharged you will need someone to drive you home.   6.  You will follow up with the [facility] Device clinic 10-14 days after your procedure. You will follow up with Dr. Elberta Fortis 91 days after your procedure.  These appointments will be made for you.   * If you have ANY questions after you get home, please call the office [phone] and ask for Wilma Michaelson RN or send a MyChart message.   Surgical Scrub - [place] - Preparing For Surgery  Before surgery, you can play an important role. Because skin is not sterile, your skin needs to be as free of germs as possible. You can reduce the number of germs on your skin by washing with CHG (chlorahexidine gluconate) Soap before surgery.  CHG is an antiseptic cleaner which kills germs and bonds with the skin to continue killing germs even after washing.   Please do not use if you have an allergy to CHG or antibacterial soaps.  If your skin becomes reddened/irritated stop using the CHG.   Do not shave (including legs and underarms) for at least 48 hours prior to first CHG shower.  It is OK to shave your face.  Please follow these instructions carefully:  1.  Shower the night before surgery and the morning of surgery with CHG.  2.  If you choose to wash your hair, wash your hair first as usual with your normal shampoo.  3.  After you shampoo, rinse your hair and body thoroughly to remove the shampoo.  4.  Use CHG as you would any other liquid soap.  You can apply CHG directly to the skin and wash gently with a clean washcloth. 5.  Apply the CHG Soap to your body ONLY FROM THE NECK DOWN.  Do not use on open wounds or open sores.  Avoid contact with your eyes, ears, mouth and genitals (private parts).  Wash genitals (private parts) with your normal soap.  6.  Wash thoroughly, paying special attention to the area where your surgery will be performed.  7.  Thoroughly rinse your body with warm  water from the neck down.   8.  DO NOT shower/wash with your normal soap after using and rinsing off the CHG soap.  9.  Pat yourself dry with a clean towel.           10.  Wear clean pajamas.           11.  Place clean sheets on your bed the night of your first shower and do not sleep with pets.  Day of Surgery: Do not apply any deodorants/lotions.  Please wear clean clothes to the hospital/surgery  center.    Cardioverter Defibrillator Implantation An implantable cardioverter defibrillator (ICD) is a small, lightweight, battery-powered device that is placed (implanted) under the skin in the chest or abdomen. Your caregiver may prescribe an ICD if:  You have had an irregular heart rhythm (arrhythmia) that originated in the lower chambers of the heart (ventricles).  Your heart has been damaged by a disease (such as coronary artery disease) or heart condition (such as a heart attack). An ICD consists of a battery that lasts several years, a small computer called a pulse generator, and wires called leads that go into the heart. It is used to detect and correct two dangerous arrhythmias: a rapid heart rhythm (tachycardia) and an arrhythmia in which the ventricles contract in an uncoordinated way (fibrillation). When an ICD detects tachycardia, it sends an electrical signal to the heart that restores the heartbeat to normal (cardioversion). This signal is usually painless. If cardioversion does not work or if the ICD detects fibrillation, it delivers a small electrical shock to the heart (defibrillation) to restart the heart. The shock may feel like a strong jolt in the chest. ICDs may be programmed to correct other problems. Sometimes, ICDs are programmed to act as another type of implantable device called a pacemaker. Pacemakers are used to treat a slow heartbeat (bradycardia). LET YOUR CAREGIVER KNOW ABOUT:  Any allergies you have.  All medicines you are taking, including vitamins, herbs,  eyedrops, and over-the-counter medicines and creams.  Previous problems you or members of your family have had with the use of anesthetics.  Any blood disorders you have had.  Other health problems you have. RISKS AND COMPLICATIONS Generally, the procedure to implant an ICD is safe. However, as with any surgical procedure, complications can occur. Possible complications associated with implanting an ICD include:  Swelling, bleeding, or bruising at the site where the ICD was implanted.  Infection at the site where the ICD was implanted.  A reaction to medicine used during the procedure.  Nerve, heart, or blood vessel damage.  Blood clots. BEFORE THE PROCEDURE  You may need to have blood tests, heart tests, or a chest X-ray done before the day of the procedure.  Ask your caregiver about changing or stopping your regular medicines.  Make plans to have someone drive you home. You may need to stay in the hospital overnight after the procedure.  Stop smoking at least 24 hours before the procedure.  Take a bath or shower the night before the procedure. You may need to scrub your chest or abdomen with a special type of soap.  Do not eat or drink before your procedure for as long as directed by your caregiver. Ask if it is okay to take any needed medicine with a small sip of water. PROCEDURE  The procedure to implant an ICD in your chest or abdomen is usually done at a hospital in a room that has a large X-ray machine called a fluoroscope. The machine will be above you during the procedure. It will help your caregiver see your heart during the procedure. Implanting an ICD usually takes 1-3 hours. Before the procedure:   Small monitors will be put on your body. They will be used to check your heart, blood pressure, and oxygen level.  A needle will be put into a vein in your hand or arm. This is called an intravenous (IV) access tube. Fluids and medicine will flow directly into your body  through the IV tube.  Your chest or  abdomen will be cleaned with a germ-killing (antiseptic) solution. The area may be shaved.  You may be given medicine to help you relax (sedative).  You will be given a medicine called a local anesthetic. This medicine will make the surgical site numb while the ICD is implanted. You will be sleepy but awake during the procedure. After you are numb the procedure will begin. The caregiver will:  Make a small cut (incision). This will make a pocket deep under your skin that will hold the pulse generator.  Guide the leads through a large blood vessel into your heart and attach them to the heart muscles. Depending on the ICD, the leads may go into one ventricle or they may go to both ventricles and into an upper chamber of the heart (atrium).  Test the ICD.  Close the incision with stitches, glue, or staples. AFTER THE PROCEDURE  You may feel pain. Some pain is normal. It may last a few days.  You may stay in a recovery area until the local anesthetic has worn off. Your blood pressure and pulse will be checked often. You will be taken to a room where your heart will be monitored.  A chest X-ray will be taken. This is done to check that the cardioverter defibrillator is in the right place.  You may stay in the hospital overnight.  A slight bump may be seen over the skin where the ICD was placed. Sometimes, it is possible to feel the ICD under the skin. This is normal.  In the months and years afterward, your caregiver will check the device, the leads, and the battery every few months. Eventually, when the battery is low, the ICD will be replaced.   This information is not intended to replace advice given to you by your health care provider. Make sure you discuss any questions you have with your health care provider.   Document Released: 06/09/2002 Document Revised: 07/08/2013 Document Reviewed: 10/06/2012 Elsevier Interactive Patient Education 2016  Elsevier Inc.    Cardioverter Defibrillator Implantation, Care After This sheet gives you information about how to care for yourself after your procedure. Your health care provider may also give you more specific instructions. If you have problems or questions, contact your health care provider. What can I expect after the procedure? After the procedure, it is common to have:  Some pain. It may last a few days.  A slight bump over the skin where the device was placed. Sometimes, it is possible to feel the device under the skin. This is normal.  During the months and years after your procedure, your health care provider will check the device, the leads, and the battery every few months. Eventually, when the battery is low, the device will be replaced. Follow these instructions at home: Medicines  Take over-the-counter and prescription medicines only as told by your health care provider.  If you were prescribed an antibiotic medicine, take it as told by your health care provider. Do not stop taking the antibiotic even if you start to feel better. Incision care   Follow instructions from your health care provider about how to take care of your incision area. Make sure you: ? Wash your hands with soap and water before you change your bandage (dressing). If soap and water are not available, use hand sanitizer. ? Change your dressing as told by your health care provider. ? Leave stitches (sutures), skin glue, or adhesive strips in place. These skin closures may need to stay  in place for 2 weeks or longer. If adhesive strip edges start to loosen and curl up, you may trim the loose edges. Do not remove adhesive strips completely unless your health care provider tells you to do that.  Check your incision area every day for signs of infection. Check for: ? More redness, swelling, or pain. ? More fluid or blood. ? Warmth. ? Pus or a bad smell.  Do not use lotions or ointments near the incision  area unless told by your health care provider.  Keep the incision area clean and dry for 2-3 days after the procedure or for as long as told by your health care provider. It takes several weeks for the incision site to heal completely.  Do not take baths, swim, or use a hot tub until your health care provider approves. Activity  Try to walk a little every day. Exercising is important after this procedure. Also, use your shoulder on the side of the defibrillator in daily tasks that do not require a lot of motion.  For at least 6 weeks: ? Do not lift your upper arm above your shoulders. This means no tennis, golf, or swimming for this period of time. If you tend to sleep with your arm above your head, use a restraint to prevent this during sleep. ? Avoid sudden jerking, pulling, or chopping movements that pull your upper arm far away from your body.  Ask your health care provider when you may go back to work.  Check with your health care provider before you start to drive or play sports. Electric and magnetic fields  Tell all health care providers that you have a defibrillator. This may prevent them from giving you an MRI scan because strong magnets are used for that test.  If you must pass through a metal detector, quickly walk through it. Do not stop under the detector, and do not stand near it.  Avoid places or objects that have a strong electric or magnetic field, including: ? [employer]. At the airport, let officials know that you have a defibrillator. Your defibrillator ID card will let you be checked in a way that is safe for you and will not damage your defibrillator. Also, do not let a security person wave a magnetic wand near your defibrillator. That can make it stop working. ? Power plants. ? Large electrical generators. ? Anti-theft systems or electronic article surveillance (EAS). ? Radiofrequency transmission towers, such as cell phone and radio towers.  Do not  use amateur (ham) radio equipment or electric (arc) welding torches. Some devices are safe to use if held at least 12 inches (30 cm) from your defibrillator. These include power tools, lawn mowers, and speakers. If you are unsure if something is safe to use, ask your health care provider.  Do not use MP3 player headphones. They have magnets.  You may safely use electric blankets, heating pads, computers, and microwave ovens.  When using your cell phone, hold it to the ear that is on the opposite side from the defibrillator. Do not leave your cell phone in a pocket over the defibrillator. General instructions  Follow diet instructions from your health care provider, if this applies.  Always keep your defibrillator ID card with you. The card should list the implant date, device model, and manufacturer. Consider wearing a medical alert bracelet or necklace.  Have your defibrillator checked every 3-6 months or as often as told by your health care provider. Most defibrillators last  for 4-8 years.  Keep all follow-up visits as told by your health care provider. This is important for your health care provider to make sure your chest is healing the way it should. Ask your health care provider when you should come back to have your stitches or staples taken out. Contact a health care provider if:  You feel one shock in your chest.  You gain weight suddenly.  Your legs or feet swell more than they have before.  It feels like your heart is fluttering or skipping beats (heart palpitations).  You have more redness, swelling, or pain around your incision.  You have more fluid or blood coming from your incision.  Your incision feels warm to the touch.  You have pus or a bad smell coming from your incision.  You have a fever. Get help right away if:  You have chest pain.  You feel more than one shock.  You feel more short of breath than you have felt before.  You feel more light-headed  than you have felt before.  Your incision starts to open up. This information is not intended to replace advice given to you by your health care provider. Make sure you discuss any questions you have with your health care provider. Document Released: 04/06/2005 Document Revised: 04/06/2016 Document Reviewed: 02/22/2016 Elsevier Interactive Patient Education  2018 [place] Discharge Instructions for  Pacemaker/Defibrillator Patients  ACTIVITY No heavy lifting or vigorous activity with your left/right arm for 6 to 8 weeks.  Do not raise your left/right arm above your head for one week.  Gradually raise your affected arm as drawn below.           __  NO DRIVING for     ; you may begin driving on     .  WOUND CARE - Keep the wound area clean and dry.  Do not get this area wet for one week. No showers for one week; you may shower on     . - The tape/steri-strips on your wound will fall off; do not pull them off.  No bandage is needed on the site.  DO  NOT apply any creams, oils, or ointments to the wound area. - If you notice any drainage or discharge from the wound, any swelling or bruising at the site, or you develop a fever > 101? F after you are discharged home, call the office at once.  SPECIAL INSTRUCTIONS - You are still able to use cellular telephones; use the ear opposite the side where you have your pacemaker/defibrillator.  Avoid carrying your cellular phone near your device. - When traveling through airports, show security personnel your identification card to avoid being screened in the metal detectors.  Ask the security personnel to use the hand wand. - Avoid arc welding equipment, MRI testing (magnetic resonance imaging), TENS units (transcutaneous nerve stimulators).  Call the office for questions about other devices. - Avoid electrical appliances that are in poor condition or are not properly grounded. - Microwave ovens are safe to be near or to  operate.  ADDITIONAL INFORMATION FOR DEFIBRILLATOR PATIENTS SHOULD YOUR DEVICE GO OFF: - If your device goes off ONCE and you feel fine afterward, notify the device clinic nurses. - If your device goes off ONCE and you do not feel well afterward, call 911. - If your device goes off TWICE, call 911. - If your device goes off [place], call 911.  DO NOT DRIVE YOURSELF OR A FAMILY MEMBER WITH A DEFIBRILLATOR TO THE HOSPITAL--CALL 911.

## 2019-10-26 ENCOUNTER — Encounter: Payer: Self-pay | Admitting: Internal Medicine

## 2019-10-26 ENCOUNTER — Other Ambulatory Visit: Payer: Self-pay

## 2019-10-26 ENCOUNTER — Ambulatory Visit: Payer: BC Managed Care – PPO | Admitting: Internal Medicine

## 2019-10-26 VITALS — BP 106/71 | HR 81 | Ht 72.0 in | Wt 206.2 lb

## 2019-10-26 DIAGNOSIS — I255 Ischemic cardiomyopathy: Secondary | ICD-10-CM | POA: Diagnosis not present

## 2019-10-26 DIAGNOSIS — Z9861 Coronary angioplasty status: Secondary | ICD-10-CM

## 2019-10-26 DIAGNOSIS — I5022 Chronic systolic (congestive) heart failure: Secondary | ICD-10-CM

## 2019-10-26 DIAGNOSIS — I2510 Atherosclerotic heart disease of native coronary artery without angina pectoris: Secondary | ICD-10-CM | POA: Diagnosis not present

## 2019-10-26 NOTE — Patient Instructions (Signed)
Medication Instructions:  Your physician recommends that you continue on your current medications as directed. Please refer to the Current Medication list given to you today.  *If you need a refill on your cardiac medications before your next appointment, please call your pharmacy*  Follow-Up: At [facility], you and your health needs are our priority.  As part of our continuing mission to provide you with exceptional heart care, we have created designated Provider Care Teams.  These Care Teams include your primary Cardiologist (physician) and Advanced Practice Providers (APPs -  Physician Assistants and Nurse Practitioners) who all work together to provide you with the care you need, when you need it.  Your next appointment:   6 month(s)  The format for your next appointment:   In Person  Provider:   K. Chad Hilty, MD  Other Instructions   

## 2019-10-26 NOTE — Progress Notes (Signed)
Marland Kitchen    OFFICE NOTE  Chief Complaint:  "I feel great"  Primary Care Physician: Alan Rua, MD  HPI:  Alan White is a 59 y.o. male with no history of CAD. CRFs are tobacco, FH. No known history of HTN, HL, DM. He presented 12/28/13 with a 4 day history of intermittent chest pressure. It was left substernal, radiating to both arms. It was 8/10 at its worst. It had occurred at rest and with exertion. He tried OTC PPI and Tums, without relief. One time, he took ASA which helped. It seems to happen more often in the afternoon/evening. It has also woken him from sleep. He had SOB with it it if occurred with exertion, but also describes DOE. He has had no N&V or diaphoresis. He told his wife about it 3 days ago, has had multiple episodes. He took a day off from work and came to the ER because it was scaring him. He was admitted by Dr. Rennis White with addition of IV heparin and NTG. Cardiac enzymes were negative- exceptfor 0.11 of troponin marker. Pt underwent cardiac cath with findings of:   1. Severe proximal to mid LAD and mid circumflex stenoses as outlined above.  2. Normal right coronary artery.  3. Wall motion abnormality involving the inferoapical wall. Preserved LVEF at 50-55%  4. Successful circumflex DES implantation post dilated to 3.75 mm in diameter and an aneurysmal portion of the vessel. 0% stenosis was noted post deployment.  5. Successful deployment of overlapping stents in the proximal to mid LAD from 99% to 0% with TIMI grade 3 flow. Postdilatation diameter was 3.0   Dual antiplatelet therapy for greater than 12 months. Brilinta can be switched to Plavix at 6 months.   Pt did well post procedure. EKG with incomplete RBBB, t wave inversions in ant lat leads. Was seen by Dr. Swaziland and found stable for discharge. Will ambulate with cardiac rehab first. Have called Alan White to arrange new pt appointment, they are to call pt.   Other issues borderline diabetes will have  dietician see before discharge. Pt on statin, BB and asa, Brilinta. BP is borderline, ACE will be added as outpatient. No work until  01/04/14.  [name] had been reporting very similar left chest discomfort that he had prior to his original stents. He reported a soreness under the left breast which improved with elevating the left breast. This is very atypical however seem to improve significantly after having his stents placed. He said that he felt great for about 3 months after having stents placed but is since become more short of breath and continues to have chest discomfort. He presented the hospital with similar complaints in early October and underwent a repeat cardiac catheterization which showed widely patent stents. He was discharged without changes in his medication. He comes back today with again similar complaints in the left chest. Unclear whether this is angina or not. He may have had some improvement with a nitrate. Again he is describing some shortness of breath.  [name] returns today and reports she is feeling the best he has in years. He took his isosorbide for about a week and then had awful headaches and discontinued it. He did notice a change during that week and improvement in his chest pain symptoms, however. He also was switched from Brilinta to Plavix and seems to be tolerating this well. His P2 Y 12 assay indicated 221,which is adequate platelet suppression.  I saw [name] back in  the office today. Overall he tends to feel fatigued at times. He said initially it felt the best that he had in years after his PCI but now seems to tired fairly easily. He was ordered for sleep study however that has not yet been performed. He also has some significant anxiety and continues to smoke. He's been very difficult for him to quit smoking. I do believe anxiety is playing a role in this. He denies anymore cardiac chest pain.  [name] returns today for follow-up. Overall he is feeling  excellent today. He denies any chest pain or shortness of breath. He's been dealing with a kidney stone but he seeing a urologist for this. He has significantly cut back his smoking down to 5 cigarettes a day. He is not started Zyban, but he has filled the prescription and said that he would start the medicine if he cannot stop smoking on his own.  05/29/2018  [name] is seen today in follow-up.  He denies any chest pain or worsening shortness of breath.  He recently saw Alan Deforest, PA-C in February 2019.  He is complaining of some leg pain and underwent venous Dopplers which were negative for DVT.  He had some associated edema.  That is resolved.  He denies any worsening chest pain.  He has not had any lab work in the past couple of years.  08/20/2019  [name] is seen today in follow-up.  I saw him recently for virtual visit.  He had interim decline in LVEF down to 25 to 30% with severe akinesis of the mid apical anteroseptal wall lateral wall and apical septum.  It was felt that there might be some nonischemic and ischemic mixed cardiomyopathy.  I did switch him over to [facility] and he is on spironolactone.  Unfortunately he had hypotension with this and was feeling very fatigued.  His spironolactone was then decreased to 12.5 mg every other day.  After doing this his blood pressure has come up now into the low 100s and he feels much better.  He is currently not working and is out until February on short-term disability.  10/26/2019  [name] returns today for follow-up.  He underwent a repeat echo which unfortunately shows no significant improvement in LV function with EF 25 to 30%.  He is ready been referred to and seen by Dr. Curt White in early January for evaluation of AICD.  He agrees that we should proceed with this due to increased risk of sudden cardiac death.  [name] has started doing some more exercise.  He says he does almost on a daily basis with either walking on a treadmill or  riding a stationary bicycle between 15 and 30 minutes.  Overall he says he feels now the best that he has in a while.  Recently was started on a nitro patch which he says is helped significantly as well therefore he may have either had some persistent angina or heart failure symptoms that were improved with this.  He is currently on maximal tolerated goal-directed therapy for congestive heart failure, however due to hypotension with blood pressures typically in the 35T to 732 systolic, I am unable to uptitrate his medicine further.  PMHx:  Past White History:  Diagnosis Date  . CAD (coronary artery disease), 12/29/13 PCI/DES LCX and PCI/DES LAD with overlapping DES  12/30/2013   cath 07/05/14 [place]  . Dyslipidemia, goal LDL below 70 12/30/2013  . GERD (gastroesophageal reflux disease)   . Metabolic  syndrome, HgbA1C 6.0  12/30/2013  . Sleep apnea    by history  . Smoker     Past Surgical History:  Procedure Laterality Date  . CARDIAC CATHETERIZATION  07/05/14   patent stents  . CORONARY ANGIOPLASTY WITH STENT PLACEMENT  12/29/13   DES to LCX and overlapping stents DES mid LAD  . CORONARY/GRAFT ACUTE MI REVASCULARIZATION N/A 01/31/2019   Procedure: Coronary/Graft Acute MI Revascularization;  Surgeon: Kathleene Hazel, MD;  [facility] INVAS[facility] LAB;  Service: Cardiovascular;  Laterality: N/A;  . LEFT HEART CATH AND CORONARY ANGIOGRAPHY N/A 01/31/2019   Procedure: LEFT HEART CATH AND CORONARY ANGIOGRAPHY;  Surgeon: Kathleene Hazel, MD;  Location: [facility] INVASIVE CV LAB;  Service: Cardiovascular;  Laterality: N/A;  . LEFT HEART CATHETERIZATION WITH CORONARY ANGIOGRAM N/A 12/29/2013   Procedure: LEFT HEART CATHETERIZATION WITH CORONARY ANGIOGRAM;  Surgeon: [name], MD;  Location: [facility] CATH LAB;  Service: Cardiovascular;  Laterality: N/A;  . LEFT HEART CATHETERIZATION WITH CORONARY ANGIOGRAM N/A 07/05/2014   Procedure: LEFT HEART CATHETERIZATION WITH CORONARY ANGIOGRAM;  Surgeon: Peter M  Swaziland, MD;  Location: [facility] CATH LAB;  Service: Cardiovascular;  Laterality: N/A;  . PERCUTANEOUS CORONARY STENT INTERVENTION (PCI-S)  12/29/2013   Procedure: PERCUTANEOUS CORONARY STENT INTERVENTION (PCI-S);  Surgeon: [name], MD;  Location: [facility] CATH LAB;  Service: Cardiovascular;;    FAMHx:  Family History  Problem Relation Age of Onset  . Heart attack Brother        Deceased  . Heart attack Brother   . Stroke Sister   . Diabetes Father        also heart disease  . Cancer Father        Deceased  . Diabetes Mother   . Cancer Mother        Deceased    SOCHx:   reports that he has been smoking cigarettes. He has a 15.00 pack-year smoking history. He has never used smokeless tobacco. He reports that he does not drink alcohol or use drugs.  ALLERGIES:  No Known Allergies  ROS: A comprehensive review of systems was negative.  HOME MEDS: Current Outpatient Medications  Medication Sig Dispense Refill  . aspirin EC 81 MG EC tablet Take 1 tablet (81 mg total) by mouth daily. 30 tablet 11  . atorvastatin (LIPITOR) 80 MG tablet TAKE 1 TABLET BY MOUTH EVERY DAY 90 tablet 1  . carvedilol (COREG) 3.125 MG tablet Take 1 tablet (3.125 mg total) by mouth 2 (two) times daily with a meal. 60 tablet 8  . nitroGLYCERIN (NITRODUR - DOSED IN MG/24 HR) 0.2 mg/hr patch Place 1 patch (0.2 mg total) onto the skin daily. 30 patch 3  . nitroGLYCERIN (NITROSTAT) 0.4 MG SL tablet Place 1 tablet (0.4 mg total) under the tongue every 5 (five) minutes x 3 doses as needed for chest pain. 25 tablet 12  . pantoprazole (PROTONIX) 40 MG tablet TAKE 1 TABLET BY MOUTH EVERY DAY 30 tablet 8  . sacubitril-valsartan (ENTRESTO) 24-26 MG Take 1 tablet by mouth 2 (two) times daily. 60 tablet 11  . spironolactone (ALDACTONE) 25 MG tablet Take 0.5 tablets (12.5 mg total) by mouth every other day. 30 tablet 6  . ticagrelor (BRILINTA) 90 MG TABS tablet Take 1 tablet (90 mg total) by mouth 2 (two) times daily. 60 tablet  11   No current facility-administered medications for this visit.    LABS/IMAGING: No results found for this or any previous visit (from the  past 48 hour(s)). No results found.  VITALS: BP 106/71   Pulse 81   Ht 6' (1.829 m)   Wt 206 lb 3.2 oz (93.5 kg)   BMI 27.97 kg/m   EXAM: General appearance: alert and no distress Neck: no carotid bruit, no JVD and thyroid not enlarged, symmetric, no tenderness/mass/nodules Lungs: clear to auscultation bilaterally Heart: regular rate and rhythm, S1, S2 normal, no murmur, click, rub or gallop Abdomen: soft, non-tender; bowel sounds normal; no masses,  no organomegaly Extremities: extremities normal, atraumatic, no cyanosis or edema Pulses: 2+ and symmetric Skin: Skin color, texture, turgor normal. No rashes or lesions Neurologic: Grossly normal Psych: Pleasant  EKG: Deferred  ASSESSMENT: 1. Coronary artery disease status post two-vessel PCI to the left circumflex and LAD (2015) 2. Dyslipidemia on statin 3. Tobacco abuse - working on quitting 4. Anxiety 5. Mixed ischemic and nonischemic cardiomyopathy EF 25 to 30%, NYHA class II-III symptoms  PLAN: 1.   [name] has persistent low EF less than 35% with NYHA class II-III symptoms.  Recently his symptoms have improved somewhat I think partially related to more exercise that he is doing as well as may be the addition of the nitro patch.  I agree with the recommendation for proceeding with an AICD.  He is currently on short-term disability and has concerns about possibly losing insurance.  He says he wants to go back to work but only for couple more years.  I think we should proceed with AICD as soon as possible in which case he will need likely about a month extension to his short-term disability to recover and then can go back to work.  We may run into issues with COVID-19 and being able to schedule elective procedures.  I will defer this to Dr. Elberta Fortis.  If he has to wait for an ICD for  several months or indefinitely due to scheduling issues, we might consider a LifeVest.  Follow-up with me in 6 months.  Chrystie Nose, MD, [facility], FACP  [place]  [facility]  White Director of the Advanced Lipid Disorders &  Cardiovascular Risk Reduction Clinic Diplomate of the American Board of Clinical Lipidology Attending Cardiologist  Direct Dial: [phone]  Fax: [phone]  Website:  [email or web address] Mery Guadalupe 10/26/2019, 9:04 AM

## 2019-10-27 ENCOUNTER — Telehealth: Payer: Self-pay | Admitting: Cardiology

## 2019-10-27 NOTE — Telephone Encounter (Signed)
Having issues with work and short term disability Short term runs out this Friday and he needs a note to return to work Monday, otherwise he will lose his insurance.   Aware I will forward this to Dr. Blanchie Dessert nurse to address by tomorrow  (pt is anxious about getting this taken care of).  He is asking to hold off on his procedure until March.  Pt aware I will discuss this w/ Dr. Elberta Fortis and let him know recommendation by tomorrow. Pt appreciates the help with this.

## 2019-10-27 NOTE — Telephone Encounter (Signed)
New Message   Patient is calling to see if he will be scheduled for his defibrillator insert soon. He is concerned about his insurance and short term disability. Please call to discuss.

## 2019-10-27 NOTE — Telephone Encounter (Signed)
Routed to MD concerning work note

## 2019-10-27 NOTE — Telephone Encounter (Signed)
Letter to return to work for Mr. Mozer was sent through [employer].  Chrystie Nose, MD, [facility], FACP  [place]  [facility]  Medical Director of the Advanced Lipid Disorders &  Cardiovascular Risk Reduction Clinic Diplomate of the American Board of Clinical Lipidology Attending Cardiologist  Direct Dial: [phone]  Fax: [phone]  Website:  [email or web address]

## 2019-10-27 NOTE — Telephone Encounter (Signed)
Since I am off tomorrow, letter could be composed and routed to patient via MyChart

## 2019-10-28 NOTE — Telephone Encounter (Signed)
Pt reports that he received the letter to return to work and is very grateful for our help with this. He has also discussed w/ work and he will be able to take 2 weeks off when time for ICD. Made aware Dr. Elberta Fortis agreeable to waiting until March if pt wishes. Will arrange ICD implant for 3/18. Pt aware I will follow up next month to review procedure instructions. Pt thanks [place] tremendously for helping and considering his wishes. Patient verbalized understanding and agreeable to plan.

## 2019-10-29 ENCOUNTER — Other Ambulatory Visit: Payer: Self-pay | Admitting: Physician Assistant

## 2019-11-06 ENCOUNTER — Telehealth: Payer: Self-pay | Admitting: *Deleted

## 2019-11-06 NOTE — Telephone Encounter (Signed)
Pt aware I will follow up next week to change medication and schedule ICD implant. Pt would like to go 3/18 Agreeable to speaking next week

## 2019-11-06 NOTE — Telephone Encounter (Signed)
Camnitz, Will Daphine Deutscher, MD  Baird Lyons, RN  Can we stop brillinta and start plavix with a loading dose of 300 mg followed by 75 mg, thanks.

## 2019-12-07 MED ORDER — CLOPIDOGREL BISULFATE 75 MG PO TABS: 75.0000 mg | ORAL_TABLET | Freq: Every day | ORAL | 6 refills | Status: DC

## 2019-12-07 NOTE — Telephone Encounter (Signed)
Discussed procedure information with pt. H&P virtual visit scheduled for 3/16. Covid screening scheduled for 3/15. Procedure instructions reviewed again w/ pt. Pt aware to stop Brilinta, start Plavix 75 mg once daily.  Pt will take Brilinta tomorrow and start the Plavix Wednesday. Aware office will call to arrange post procedure follow up. Patient verbalized understanding and agreeable to plan.

## 2019-12-14 ENCOUNTER — Other Ambulatory Visit (HOSPITAL_COMMUNITY)
Admission: RE | Admit: 2019-12-14 | Discharge: 2019-12-14 | Disposition: A | Discharge status: Home / Self Care | Payer: BC Managed Care – PPO | Source: Ambulatory Visit | Attending: Cardiology | Admitting: Cardiology

## 2019-12-14 DIAGNOSIS — Z01812 Encounter for preprocedural laboratory examination: Secondary | ICD-10-CM | POA: Insufficient documentation

## 2019-12-14 DIAGNOSIS — Z20822 Contact with and (suspected) exposure to covid-19: Secondary | ICD-10-CM | POA: Insufficient documentation

## 2019-12-14 LAB — SARS CORONAVIRUS 2 (TAT 6-24 HRS): SARS Coronavirus 2: NEGATIVE

## 2019-12-15 ENCOUNTER — Telehealth (INDEPENDENT_AMBULATORY_CARE_PROVIDER_SITE_OTHER): Payer: BC Managed Care – PPO | Admitting: Cardiology

## 2019-12-15 ENCOUNTER — Other Ambulatory Visit: Payer: Self-pay

## 2019-12-15 DIAGNOSIS — I2510 Atherosclerotic heart disease of native coronary artery without angina pectoris: Secondary | ICD-10-CM | POA: Diagnosis not present

## 2019-12-15 DIAGNOSIS — I5022 Chronic systolic (congestive) heart failure: Secondary | ICD-10-CM | POA: Diagnosis not present

## 2019-12-15 DIAGNOSIS — I255 Ischemic cardiomyopathy: Secondary | ICD-10-CM

## 2019-12-15 DIAGNOSIS — E785 Hyperlipidemia, unspecified: Secondary | ICD-10-CM | POA: Diagnosis not present

## 2019-12-15 NOTE — Progress Notes (Signed)
Electrophysiology TeleHealth Note   Due to national recommendations of social distancing due to COVID 19, an audio/video telehealth visit is felt to be most appropriate for this patient at this time.  See Epic message for the patient's consent to telehealth for [facility].   Date:  12/15/2019   ID:  Alan White, DOB 08/15/1961, MRN [id number]  Location: patient's home  Provider location: [address] [place]  Evaluation Performed: Follow-up visit  PCP:  Joycelyn Rua, MD  Cardiologist:  Chrystie Nose, MD  Electrophysiologist:  Dr Elberta Fortis  Chief Complaint:  CHF  History of Present Illness:    Alan White is a 59 y.o. male who presents via audio/video conferencing for a telehealth visit today.  Since last being seen in our clinic, the patient reports doing very well.  Today, he denies symptoms of palpitations, chest pain, shortness of breath,  lower extremity edema, dizziness, presyncope, or syncope.  The patient is otherwise without complaint today.  The patient denies symptoms of fevers, chills, cough, or new SOB worrisome for COVID 19.  He has a history of coronary artery disease status post multiple stents with an ischemic cardiomyopathy, hyperlipidemia, and obstructive sleep apnea.  He is presenting today as a preop visit for ICD implant.  Today, denies symptoms of palpitations, chest pain, shortness of breath, orthopnea, PND, lower extremity edema, claudication, dizziness, presyncope, syncope, bleeding, or neurologic sequela. The patient is tolerating medications without difficulties.    Past Medical History:  Diagnosis Date  . CAD (coronary artery disease), 12/29/13 PCI/DES LCX and PCI/DES LAD with overlapping DES  12/30/2013   cath 07/05/14 [place]  . Dyslipidemia, goal LDL below 70 12/30/2013  . GERD (gastroesophageal reflux disease)   . Metabolic syndrome, HgbA1C 6.0  12/30/2013  . Sleep apnea    by history  . Smoker     Past Surgical History:    Procedure Laterality Date  . CARDIAC CATHETERIZATION  07/05/14   patent stents  . CORONARY ANGIOPLASTY WITH STENT PLACEMENT  12/29/13   DES to LCX and overlapping stents DES mid LAD  . CORONARY/GRAFT ACUTE MI REVASCULARIZATION N/A 01/31/2019   Procedure: Coronary/Graft Acute MI Revascularization;  Surgeon: Kathleene Hazel, MD;  Location: [facility] INVASIVE CV LAB;  Service: Cardiovascular;  Laterality: N/A;  . LEFT HEART CATH AND CORONARY ANGIOGRAPHY N/A 01/31/2019   Procedure: LEFT HEART CATH AND CORONARY ANGIOGRAPHY;  Surgeon: Kathleene Hazel, MD;  Location: [facility] INVASIVE CV LAB;  Service: Cardiovascular;  Laterality: N/A;  . LEFT HEART CATHETERIZATION WITH CORONARY ANGIOGRAM N/A 12/29/2013   Procedure: LEFT HEART CATHETERIZATION WITH CORONARY ANGIOGRAM;  Surgeon: [name], MD;  Location: [facility] CATH LAB;  Service: Cardiovascular;  Laterality: N/A;  . LEFT HEART CATHETERIZATION WITH CORONARY ANGIOGRAM N/A 07/05/2014   Procedure: LEFT HEART CATHETERIZATION WITH CORONARY ANGIOGRAM;  Surgeon: Peter M Swaziland, MD;  Location: [facility] CATH LAB;  Service: Cardiovascular;  Laterality: N/A;  . PERCUTANEOUS CORONARY STENT INTERVENTION (PCI-S)  12/29/2013   Procedure: PERCUTANEOUS CORONARY STENT INTERVENTION (PCI-S);  Surgeon: [name], MD;  Location: [facility] CATH LAB;  Service: Cardiovascular;;    Current Outpatient Medications  Medication Sig Dispense Refill  . aspirin EC 81 MG EC tablet Take 1 tablet (81 mg total) by mouth daily. 30 tablet 11  . atorvastatin (LIPITOR) 80 MG tablet TAKE 1 TABLET BY MOUTH EVERY DAY (Patient taking differently: Take 80 mg by mouth daily. ) 90 tablet 1  . carvedilol (COREG) 3.125 MG  tablet TAKE 1 TABLET BY MOUTH TWICE A DAY WITH A MEAL (Patient taking differently: Take 3.125 mg by mouth in the morning and at bedtime. ) 180 tablet 3  . clopidogrel (PLAVIX) 75 MG tablet Take 1 tablet (75 mg total) by mouth daily. 30 tablet 6  . nitroGLYCERIN (NITRODUR - DOSED IN MG/24  HR) 0.2 mg/hr patch Place 1 patch (0.2 mg total) onto the skin daily. (Patient taking differently: Place 0.2 mg onto the skin daily. Take off at bedtime) 30 patch 3  . nitroGLYCERIN (NITROSTAT) 0.4 MG SL tablet Place 1 tablet (0.4 mg total) under the tongue every 5 (five) minutes x 3 doses as needed for chest pain. 25 tablet 12  . pantoprazole (PROTONIX) 40 MG tablet TAKE 1 TABLET BY MOUTH EVERY DAY (Patient taking differently: Take 40 mg by mouth daily. ) 30 tablet 8  . sacubitril-valsartan (ENTRESTO) 24-26 MG Take 1 tablet by mouth 2 (two) times daily. 60 tablet 11  . spironolactone (ALDACTONE) 25 MG tablet Take 0.5 tablets (12.5 mg total) by mouth every other day. (Patient taking differently: Take 12.5 mg by mouth every other day. Morning) 30 tablet 6   No current facility-administered medications for this visit.    Allergies:   Patient has no known allergies.   Social History:  The patient  reports that he has been smoking cigarettes. He has a 15.00 pack-year smoking history. He has never used smokeless tobacco. He reports that he does not drink alcohol or use drugs.   Family History:  The patient's  family history includes Cancer in his father and mother; Diabetes in his father and mother; Heart attack in his brother and brother; Stroke in his sister.   ROS:  Please see the history of present illness.   All other systems are personally reviewed and negative.    Exam:    Vital Signs:  There were no vitals taken for this visit.  no acute distress, no shortness of breath.  Labs/Other Tests and Data Reviewed:    Recent Labs: 01/31/2019: TSH 1.793 02/01/2019: Magnesium 1.8 05/19/2019: ALT 22; Hemoglobin 13.9; Platelets 262 07/21/2019: BUN 17; Creatinine, Ser 1.13; Potassium 4.5; Sodium 142   Wt Readings from Last 3 Encounters:  10/26/19 206 lb 3.2 oz (93.5 kg)  10/06/19 209 lb 3.2 oz (94.9 kg)  09/16/19 210 lb (95.3 kg)     Other studies personally reviewed: Additional studies/  records that were reviewed today include: 09/16/2019 Review of the above records today demonstrates: Sinus rhythm   ASSESSMENT & PLAN:    1.  Chronic systolic heart failure due to ischemic cardiomyopathy: Ejection fraction 25 to 30%.  Currently on optimal medical therapy with Entresto, Aldactone, carvedilol.  We Lynne Takemoto plan for ICD implant.  Risks and benefits were discussed which include bleeding, tamponade, infection, pneumothorax.  The patient understands these risks and is agreed to the procedure.  2.  Coronary artery disease: No current chest pain.  Plan to switch from Brilinta to Plavix pre-ICD implant.  3.  Hyperlipidemia: Continue statin   COVID 19 screen The patient denies symptoms of COVID 19 at this time.  The importance of social distancing was discussed today.  Follow-up:  3 months  Current medicines are reviewed at length with the patient today.   The patient does not have concerns regarding his medicines.  The following changes were made today:  none  Labs/ tests ordered today include:  No orders of the defined types were placed in this encounter.  Patient Risk:  after full review of this patients clinical status, I feel that they are at moderate risk at this time.  Today, I have spent 9 minutes with the patient with telehealth technology discussing CHF .    Signed, Branden Vine Meredith Leeds, MD  12/15/2019 8:41 AM     [address] [place] [address] [phone] (office) [phone] (fax)

## 2019-12-16 NOTE — Progress Notes (Signed)
Attempted to call patient to go over procedure instructions for tomorrow.  No answer 

## 2019-12-17 ENCOUNTER — Other Ambulatory Visit: Payer: Self-pay

## 2019-12-17 ENCOUNTER — Ambulatory Visit (HOSPITAL_COMMUNITY)
Admission: RE | Admit: 2019-12-17 | Discharge: 2019-12-17 | Disposition: A | Discharge status: Home / Self Care | Payer: BC Managed Care – PPO | Attending: Cardiology | Admitting: Cardiology

## 2019-12-17 ENCOUNTER — Encounter (HOSPITAL_COMMUNITY)
Admission: RE | Disposition: A | Discharge status: Home / Self Care | Payer: Self-pay | Source: Home / Self Care | Attending: Cardiology

## 2019-12-17 ENCOUNTER — Ambulatory Visit (HOSPITAL_COMMUNITY): Payer: BC Managed Care – PPO

## 2019-12-17 DIAGNOSIS — G4730 Sleep apnea, unspecified: Secondary | ICD-10-CM | POA: Insufficient documentation

## 2019-12-17 DIAGNOSIS — I255 Ischemic cardiomyopathy: Secondary | ICD-10-CM | POA: Insufficient documentation

## 2019-12-17 DIAGNOSIS — I5022 Chronic systolic (congestive) heart failure: Secondary | ICD-10-CM | POA: Diagnosis not present

## 2019-12-17 DIAGNOSIS — Z7982 Long term (current) use of aspirin: Secondary | ICD-10-CM | POA: Diagnosis not present

## 2019-12-17 DIAGNOSIS — Z7902 Long term (current) use of antithrombotics/antiplatelets: Secondary | ICD-10-CM | POA: Diagnosis not present

## 2019-12-17 DIAGNOSIS — K219 Gastro-esophageal reflux disease without esophagitis: Secondary | ICD-10-CM | POA: Insufficient documentation

## 2019-12-17 DIAGNOSIS — Z955 Presence of coronary angioplasty implant and graft: Secondary | ICD-10-CM | POA: Insufficient documentation

## 2019-12-17 DIAGNOSIS — Z006 Encounter for examination for normal comparison and control in clinical research program: Secondary | ICD-10-CM | POA: Insufficient documentation

## 2019-12-17 DIAGNOSIS — I499 Cardiac arrhythmia, unspecified: Secondary | ICD-10-CM | POA: Diagnosis not present

## 2019-12-17 DIAGNOSIS — I2510 Atherosclerotic heart disease of native coronary artery without angina pectoris: Secondary | ICD-10-CM | POA: Diagnosis not present

## 2019-12-17 DIAGNOSIS — E785 Hyperlipidemia, unspecified: Secondary | ICD-10-CM | POA: Insufficient documentation

## 2019-12-17 DIAGNOSIS — Z950 Presence of cardiac pacemaker: Secondary | ICD-10-CM | POA: Diagnosis not present

## 2019-12-17 DIAGNOSIS — Z79899 Other long term (current) drug therapy: Secondary | ICD-10-CM | POA: Diagnosis not present

## 2019-12-17 DIAGNOSIS — F17210 Nicotine dependence, cigarettes, uncomplicated: Secondary | ICD-10-CM | POA: Insufficient documentation

## 2019-12-17 DIAGNOSIS — Z95818 Presence of other cardiac implants and grafts: Secondary | ICD-10-CM

## 2019-12-17 HISTORY — PX: ICD IMPLANT: EP1208

## 2019-12-17 LAB — BASIC METABOLIC PANEL
Anion gap: 12 (ref 5–15)
BUN: 13 mg/dL (ref 6–20)
CO2: 27 mmol/L (ref 22–32)
Calcium: 9.3 mg/dL (ref 8.9–10.3)
Chloride: 103 mmol/L (ref 98–111)
Creatinine, Ser: 1.37 mg/dL — ABNORMAL HIGH (ref 0.61–1.24)
GFR calc Af Amer: >60 mL/min (ref >60)
GFR calc non Af Amer: 56 mL/min — ABNORMAL LOW (ref >60)
Glucose, Bld: 108 mg/dL — ABNORMAL HIGH (ref 70–99)
Potassium: 3.7 mmol/L (ref 3.5–5.1)
Sodium: 142 mmol/L (ref 135–145)

## 2019-12-17 LAB — CBC
HCT: 45.6 % (ref 39.0–52.0)
Hemoglobin: 14.5 g/dL (ref 13.0–17.0)
MCH: 30 pg (ref 26.0–34.0)
MCHC: 31.8 g/dL (ref 30.0–36.0)
MCV: 94.4 fL (ref 80.0–100.0)
Platelets: 277 10*3/uL (ref 150–400)
RBC: 4.83 MIL/uL (ref 4.22–5.81)
RDW: 13.7 % (ref 11.5–15.5)
WBC: 10.5 10*3/uL (ref 4.0–10.5)
nRBC: 0 % (ref 0.0–0.2)

## 2019-12-17 SURGERY — ICD IMPLANT

## 2019-12-17 MED ORDER — IOHEXOL 350 MG/ML SOLN
INTRAVENOUS | Status: DC | PRN
  Administered 2019-12-17: 15 mL via INTRA_ARTERIAL

## 2019-12-17 MED ORDER — LIDOCAINE HCL (PF) 1 % IJ SOLN
INTRAMUSCULAR | Status: DC | PRN
  Administered 2019-12-17: 45 mL

## 2019-12-17 MED ORDER — FENTANYL CITRATE (PF) 100 MCG/2ML IJ SOLN
INTRAMUSCULAR | Status: DC | PRN
  Administered 2019-12-17: 25 ug via INTRAVENOUS

## 2019-12-17 MED ORDER — HEPARIN (PORCINE) IN NACL 1000-0.9 UT/500ML-% IV SOLN
INTRAVENOUS | Status: AC
  Filled 2019-12-17: qty 500

## 2019-12-17 MED ORDER — ONDANSETRON HCL 4 MG/2ML IJ SOLN: 4.0000 mg | Freq: Four times a day (QID) | INTRAMUSCULAR | Status: DC | PRN

## 2019-12-17 MED ORDER — CEFAZOLIN SODIUM-DEXTROSE 1-4 GM/50ML-% IV SOLN: 1.0000 g | Freq: Four times a day (QID) | INTRAVENOUS | Status: DC

## 2019-12-17 MED ORDER — ACETAMINOPHEN 325 MG PO TABS: 325.0000 mg | ORAL_TABLET | ORAL | Status: DC | PRN

## 2019-12-17 MED ORDER — LIDOCAINE HCL 1 % IJ SOLN
INTRAMUSCULAR | Status: AC
  Filled 2019-12-17: qty 60

## 2019-12-17 MED ORDER — SODIUM CHLORIDE 0.9 % IV SOLN: INTRAVENOUS | Status: DC

## 2019-12-17 MED ORDER — SODIUM CHLORIDE 0.9 % IV SOLN
INTRAVENOUS | Status: AC
  Filled 2019-12-17: qty 2

## 2019-12-17 MED ORDER — HEPARIN (PORCINE) IN NACL 1000-0.9 UT/500ML-% IV SOLN
INTRAVENOUS | Status: DC | PRN
  Administered 2019-12-17: 500 mL

## 2019-12-17 MED ORDER — MIDAZOLAM HCL 5 MG/5ML IJ SOLN
INTRAMUSCULAR | Status: DC | PRN
  Administered 2019-12-17 (×2): 1 mg via INTRAVENOUS

## 2019-12-17 MED ORDER — CHLORHEXIDINE GLUCONATE 4 % EX LIQD: 4.0000 "application " | Freq: Once | CUTANEOUS | Status: DC

## 2019-12-17 MED ORDER — SODIUM CHLORIDE 0.9 % IV SOLN
80.0000 mg | INTRAVENOUS | Status: DC
  Filled 2019-12-17: qty 2

## 2019-12-17 MED ORDER — FENTANYL CITRATE (PF) 100 MCG/2ML IJ SOLN
INTRAMUSCULAR | Status: AC
  Filled 2019-12-17: qty 2

## 2019-12-17 MED ORDER — MIDAZOLAM HCL 5 MG/5ML IJ SOLN
INTRAMUSCULAR | Status: AC
  Filled 2019-12-17: qty 5

## 2019-12-17 MED ORDER — CEFAZOLIN SODIUM-DEXTROSE 2-4 GM/100ML-% IV SOLN
INTRAVENOUS | Status: AC
  Filled 2019-12-17: qty 100

## 2019-12-17 MED ORDER — CEFAZOLIN SODIUM-DEXTROSE 2-4 GM/100ML-% IV SOLN
2.0000 g | INTRAVENOUS | Status: AC
  Administered 2019-12-17: 2 g via INTRAVENOUS

## 2019-12-17 SURGICAL SUPPLY — 6 items
CABLE SURGICAL S-101-97-12 (CABLE) ×3 IMPLANT
ICD GALLANT VR CDVRA500Q (ICD Generator) ×3 IMPLANT
LEAD DURATA 7122Q-65CM (Lead) ×3 IMPLANT
PAD PRO RADIOLUCENT 2001M-C (PAD) ×3 IMPLANT
SHEATH 7FR PRELUDE SNAP 13 (SHEATH) ×3 IMPLANT
TRAY PACEMAKER INSERTION (PACKS) ×3 IMPLANT

## 2019-12-17 NOTE — Discharge Instructions (Signed)
After Your ICD (Implantable Cardiac Defibrillator)  12/18/2019 Please send a remote transmision   . You have a St. Jude ICD  ACTIVITY . Do not lift your arm above shoulder height for 1 week after your procedure. After 7 days, you may progress as below.     Thursday December 24, 2019  Friday December 25, 2019 Saturday December 26, 2019 Sunday December 27, 2019   . Do not lift, push, pull, or carry anything over 10 pounds with the affected arm until 6 weeks (Thursday January 28, 2020 ) after your procedure.   . Do NOT DRIVE until you have been seen for your wound check.   . Ask your healthcare provider when you can go back to work   INCISION/Dressing  . May remove outer dressing after 24 hours. Keep site dry until follow up visit.  Marland Kitchen f you are on a blood thinner such as Coumadin, Xarelto, Eliquis, Plavix, or Pradaxa please confirm with your provider when this should be resumed.  . Monitor your defibrillator site for redness, swelling, and drainage. Call the device clinic at [phone] if you experience these symptoms or fever/chills.  . Avoid lotions, ointments, or perfumes over your incision until it is well-healed.  . You may use a hot tub or a pool AFTER your wound check appointment if the incision is completely closed.  . Your ICD is designed to protect you from life threatening heart rhythms. Because of this, you may receive a shock.   o 1 shock with no symptoms:  Call the office during business hours. o 1 shock with symptoms (chest pain, chest pressure, dizziness, lightheadedness, shortness of breath, overall feeling unwell):  Call 911. o If you experience 2 or more shocks in 24 hours:  Call 911. o If you receive a shock, you should not drive for 6 months per the [place] DMV IF you receive appropriate therapy from your ICD.   . ICD Alerts:  Some alerts are vibratory and others beep. These are NOT emergencies. Please call our office to let us know. If this occurs at night or on  weekends, it can wait until the next business day. Send a remote transmission.  . If your device is capable of reading fluid status (for heart failure), you will be offered monthly monitoring to review this with you.   DEVICE MANAGEMENT . Remote monitoring is used to monitor your ICD from home. This monitoring is scheduled every 91 days by our office. It allows [place] to keep an eye on the functioning of your device to ensure it is working properly. You will routinely see your Electrophysiologist annually (more often if necessary).   . You should receive your ID card for your new device in 4-8 weeks. Keep this card with you at all times once received. Consider wearing a medical alert bracelet or necklace.  . Your ICD  may be MRI compatible. This will be discussed at your next office visit/wound check.  You should avoid contact with strong electric or magnetic fields.    Do not use amateur (ham) radio equipment or electric (arc) welding torches. MP3 player headphones with magnets should not be used. Some devices are safe to use if held at least 12 inches (30 cm) from your defibrillator. These include power tools, lawn mowers, and speakers. If you are unsure if something is safe to use, ask your health care provider.   When using your cell phone, hold it to the ear that is on the  opposite side from the defibrillator. Do not leave your cell phone in a pocket over the defibrillator.   You may safely use electric blankets, heating pads, computers, and microwave ovens.  Call the office right away if:  You have chest pain.  You feel more than one shock.  You feel more short of breath than you have felt before.  You feel more light-headed than you have felt before.  Your incision starts to open up.  This information is not intended to replace advice given to you by your health care provider. Make sure you discuss any questions you have with your health care provider.

## 2019-12-17 NOTE — H&P (Signed)
ICD Criteria  Current LVEF:25-30%. Within 12 months prior to implant: Yes   Heart failure history: Yes, Class II  Cardiomyopathy history: Yes, Ischemic Cardiomyopathy - Prior MI.  Atrial Fibrillation/Atrial Flutter: No.  Ventricular tachycardia history: No.  Cardiac arrest history: No.  History of syndromes with risk of sudden death: No.  Previous ICD: No.  Current ICD indication: Primary  PPM indication: No.  Class I or II Bradycardia indication present: No  Beta Blocker therapy for 3 or more months: Yes, prescribed.   Ace Inhibitor/ARB therapy for 3 or more months: Yes, prescribed.    I have seen Alan White is a 59 y.o. malepre-procedural and has been referred by [facility] for consideration of ICD implant for primary prevention of sudden death.  The patient's chart has been reviewed and they meet criteria for ICD implant.  I have had a thorough discussion with the patient reviewing options.  The patient and their family (if available) have had opportunities to ask questions and have them answered. The patient and I have decided together through the [facility] Heart Care Share Decision Support Tool to implant ICD at this time.  Risks, benefits, alternatives to ICD implantation were discussed in detail with the patient today. The patient  understands that the risks include but are not limited to bleeding, infection, pneumothorax, perforation, tamponade, vascular damage, renal failure, MI, stroke, death, inappropriate shocks, and lead dislodgement and  wishes to proceed.

## 2019-12-18 ENCOUNTER — Telehealth: Payer: Self-pay | Admitting: Emergency Medicine

## 2019-12-18 MED FILL — Gentamicin Sulfate Inj 40 MG/ML: INTRAMUSCULAR | Qty: 80 | Status: AC

## 2019-12-18 MED FILL — Lidocaine HCl Local Inj 1%: INTRAMUSCULAR | Qty: 60 | Status: AC

## 2019-12-18 NOTE — Telephone Encounter (Signed)
Patient remote transmission received today. Device function WNL. Patient reports soreness at device site that is relieved by Tylenol. Patient education done on monitor and wound care.

## 2019-12-29 ENCOUNTER — Encounter: Payer: Self-pay | Admitting: *Deleted

## 2019-12-29 ENCOUNTER — Ambulatory Visit (INDEPENDENT_AMBULATORY_CARE_PROVIDER_SITE_OTHER): Payer: BC Managed Care – PPO | Admitting: *Deleted

## 2019-12-29 ENCOUNTER — Other Ambulatory Visit: Payer: Self-pay

## 2019-12-29 DIAGNOSIS — I255 Ischemic cardiomyopathy: Secondary | ICD-10-CM | POA: Diagnosis not present

## 2019-12-29 NOTE — Patient Instructions (Signed)
Call the office if you have any redness, drainage, or increased swelling at wound site.

## 2019-12-31 LAB — CUP PACEART INCLINIC DEVICE CHECK
Battery Remaining Longevity: 105 mo
Brady Statistic RV Percent Paced: 0.01 %
Date Time Interrogation Session: 20210330143800
HighPow Impedance: 65.25 Ohm
Implantable Lead Implant Date: 20210318
Implantable Lead Location: 753860
Implantable Pulse Generator Implant Date: 20210318
Lead Channel Impedance Value: 437.5 Ohm
Lead Channel Pacing Threshold Amplitude: 0.75 V
Lead Channel Pacing Threshold Pulse Width: 0.5 ms
Lead Channel Sensing Intrinsic Amplitude: 12 mV
Lead Channel Setting Pacing Amplitude: 3.5 V
Lead Channel Setting Pacing Pulse Width: 0.5 ms
Lead Channel Setting Sensing Sensitivity: 0.5 mV
Pulse Gen Serial Number: 111013899

## 2019-12-31 NOTE — Progress Notes (Signed)
Wound check appointment. Steri-strips removed. Wound without redness or edema. Incision edges approximated, wound well healed. Normal device function. Thresholds, sensing, and impedances consistent with implant measurements. Device programmed at 3.5V for extra safety margin until 3 month visit. Histogram distribution appropriate for patient and level of activity. No ventricular arrhythmias noted. Patient educated about wound care, arm mobility, lifting restrictions, shock plan. ROV 03/22/20 with Dr Elberta Fortis. Next remote due 03/17/20.

## 2020-01-13 ENCOUNTER — Other Ambulatory Visit: Payer: Self-pay | Admitting: Cardiology

## 2020-02-22 ENCOUNTER — Other Ambulatory Visit: Payer: Self-pay | Admitting: Physician Assistant

## 2020-02-24 ENCOUNTER — Other Ambulatory Visit: Payer: Self-pay | Admitting: Physician Assistant

## 2020-03-17 ENCOUNTER — Ambulatory Visit (INDEPENDENT_AMBULATORY_CARE_PROVIDER_SITE_OTHER): Payer: BC Managed Care – PPO | Admitting: *Deleted

## 2020-03-17 ENCOUNTER — Other Ambulatory Visit: Payer: Self-pay | Admitting: Cardiovascular Disease

## 2020-03-17 DIAGNOSIS — I255 Ischemic cardiomyopathy: Secondary | ICD-10-CM | POA: Diagnosis not present

## 2020-03-17 LAB — CUP PACEART REMOTE DEVICE CHECK
Battery Remaining Longevity: 96 mo
Battery Remaining Percentage: 93 %
Battery Voltage: 3.01 V
Brady Statistic RV Percent Paced: 1 %
Date Time Interrogation Session: 20210617020433
HighPow Impedance: 70 Ohm
Implantable Lead Implant Date: 20210318
Implantable Lead Location: 753860
Implantable Pulse Generator Implant Date: 20210318
Lead Channel Impedance Value: 440 Ohm
Lead Channel Pacing Threshold Amplitude: 0.75 V
Lead Channel Pacing Threshold Pulse Width: 0.5 ms
Lead Channel Sensing Intrinsic Amplitude: 12 mV
Lead Channel Setting Pacing Amplitude: 3.5 V
Lead Channel Setting Pacing Pulse Width: 0.5 ms
Lead Channel Setting Sensing Sensitivity: 0.5 mV
Pulse Gen Serial Number: 111013899

## 2020-03-17 NOTE — Progress Notes (Signed)
Remote ICD transmission.   

## 2020-03-22 ENCOUNTER — Encounter: Payer: Self-pay | Admitting: Cardiology

## 2020-03-22 ENCOUNTER — Other Ambulatory Visit: Payer: Self-pay

## 2020-03-22 ENCOUNTER — Ambulatory Visit (INDEPENDENT_AMBULATORY_CARE_PROVIDER_SITE_OTHER): Payer: BC Managed Care – PPO | Admitting: Cardiology

## 2020-03-22 DIAGNOSIS — I255 Ischemic cardiomyopathy: Secondary | ICD-10-CM

## 2020-03-22 LAB — CUP PACEART INCLINIC DEVICE CHECK
Battery Remaining Longevity: 103 mo
Brady Statistic RV Percent Paced: 0.01 %
Date Time Interrogation Session: 20210622141000
HighPow Impedance: 78.75 Ohm
Implantable Lead Implant Date: 20210318
Implantable Lead Location: 753860
Implantable Pulse Generator Implant Date: 20210318
Lead Channel Impedance Value: 487.5 Ohm
Lead Channel Pacing Threshold Amplitude: 1.25 V
Lead Channel Pacing Threshold Pulse Width: 0.5 ms
Lead Channel Sensing Intrinsic Amplitude: 12 mV
Lead Channel Setting Pacing Amplitude: 2.5 V
Lead Channel Setting Pacing Pulse Width: 0.5 ms
Lead Channel Setting Sensing Sensitivity: 0.5 mV
Pulse Gen Serial Number: 111013899

## 2020-03-22 NOTE — Patient Instructions (Signed)
Medication Instructions:  Your physician recommends that you continue on your current medications as directed. Please refer to the Current Medication list given to you today.  *If you need a refill on your cardiac medications before your next appointment, please call your pharmacy*   Lab Work: None ordered If you have labs (blood work) drawn today and your tests are completely normal, you will receive your results only by: . MyChart Message (if you have MyChart) OR . A paper copy in the mail If you have any lab test that is abnormal or we need to change your treatment, we will call you to review the results.   Testing/Procedures: None ordered   Follow-Up: Remote monitoring is used to monitor your Pacemaker of ICD from home. This monitoring reduces the number of office visits required to check your device to one time per year. It allows us to keep an eye on the functioning of your device to ensure it is working properly. You are scheduled for a device check from home on 06/16/2020. You may send your transmission at any time that day. If you have a wireless device, the transmission will be sent automatically. After your physician reviews your transmission, you will receive a postcard with your next transmission date.  At [facility], you and your health needs are our priority.  As part of our continuing mission to provide you with exceptional heart care, we have created designated Provider Care Teams.  These Care Teams include your primary Cardiologist (physician) and Advanced Practice Providers (APPs -  Physician Assistants and Nurse Practitioners) who all work together to provide you with the care you need, when you need it.  We recommend signing up for the patient portal called "MyChart".  Sign up information is provided on this After Visit Summary.  MyChart is used to connect with patients for Virtual Visits (Telemedicine).  Patients are able to view lab/test results, encounter notes,  upcoming appointments, etc.  Non-urgent messages can be sent to your provider as well.   To learn more about what you can do with MyChart, go to [email or web address].    Your next appointment:   9 month(s)  The format for your next appointment:   In Person  Provider:   Will Camnitz, MD   Thank you for choosing [facility]!!   Jakub Debold, RN [phone]    Other Instructions    

## 2020-03-22 NOTE — Progress Notes (Signed)
Electrophysiology Office Note   Date:  03/22/2020   ID:  [name], DOB 1960/12/07, MRN [id number]  PCP:  Joycelyn Rua, MD  Cardiologist:  Rennis Golden Primary Electrophysiologist:  Caroleen Stoermer Jorja Loa, MD    Chief Complaint: CHF   History of Present Illness: Alan White is a 59 y.o. male who is being seen today for the evaluation of CHF at the request of Joycelyn Rua, MD. Presenting today for electrophysiology evaluation.  He has a history significant for coronary artery disease status post multiple stents, ischemic cardiomyopathy, hyperlipidemia, obstructive sleep apnea.  He presented to emergency room 01/31/2019 with chest pain after stopping all of his medications.  He underwent left heart cath which showed an occluded proximal LAD.  He received angioplasty.  He had a patent circumflex stent.  Follow-up echo September 2020 showed an ejection fraction 25 to 30%.  Repeat echo shows continued low ejection fraction.  Today, denies symptoms of palpitations, chest pain, shortness of breath, orthopnea, PND, lower extremity edema, claudication, dizziness, presyncope, syncope, bleeding, or neurologic sequela. The patient is tolerating medications without difficulties.  He overall feels well.  He has no chest pain or shortness of breath.  He is able do all of his daily activities without restriction.   Past Medical History:  Diagnosis Date  . CAD (coronary artery disease), 12/29/13 PCI/DES LCX and PCI/DES LAD with overlapping DES  12/30/2013   cath 07/05/14 [place]  . Dyslipidemia, goal LDL below 70 12/30/2013  . GERD (gastroesophageal reflux disease)   . Metabolic syndrome, HgbA1C 6.0  12/30/2013  . Sleep apnea    by history  . Smoker    Past Surgical History:  Procedure Laterality Date  . CARDIAC CATHETERIZATION  07/05/14   patent stents  . CORONARY ANGIOPLASTY WITH STENT PLACEMENT  12/29/13   DES to LCX and overlapping stents DES mid LAD  . CORONARY/GRAFT ACUTE MI REVASCULARIZATION N/A  01/31/2019   Procedure: Coronary/Graft Acute MI Revascularization;  Surgeon: Kathleene Hazel, MD;  Location: [facility] INVASIVE CV LAB;  Service: Cardiovascular;  Laterality: N/A;  . ICD IMPLANT N/A 12/17/2019   Procedure: ICD IMPLANT;  Surgeon: Regan Lemming, MD;  Location: [facility] INVASIVE CV LAB;  Service: Cardiovascular;  Laterality: N/A;  . LEFT HEART CATH AND CORONARY ANGIOGRAPHY N/A 01/31/2019   Procedure: LEFT HEART CATH AND CORONARY ANGIOGRAPHY;  Surgeon: Kathleene Hazel, MD;  Location: [facility] INVASIVE CV LAB;  Service: Cardiovascular;  Laterality: N/A;  . LEFT HEART CATHETERIZATION WITH CORONARY ANGIOGRAM N/A 12/29/2013   Procedure: LEFT HEART CATHETERIZATION WITH CORONARY ANGIOGRAM;  Surgeon: [name], MD;  Location: [facility] CATH LAB;  Service: Cardiovascular;  Laterality: N/A;  . LEFT HEART CATHETERIZATION WITH CORONARY ANGIOGRAM N/A 07/05/2014   Procedure: LEFT HEART CATHETERIZATION WITH CORONARY ANGIOGRAM;  Surgeon: Peter M Swaziland, MD;  Location: [facility] CATH LAB;  Service: Cardiovascular;  Laterality: N/A;  . PERCUTANEOUS CORONARY STENT INTERVENTION (PCI-S)  12/29/2013   Procedure: PERCUTANEOUS CORONARY STENT INTERVENTION (PCI-S);  Surgeon: [name], MD;  Location: [facility] CATH LAB;  Service: Cardiovascular;;     Current Outpatient Medications  Medication Sig Dispense Refill  . aspirin EC 81 MG EC tablet Take 1 tablet (81 mg total) by mouth daily. 30 tablet 11  . atorvastatin (LIPITOR) 80 MG tablet TAKE 1 TABLET BY MOUTH EVERY DAY 90 tablet 1  . carvedilol (COREG) 3.125 MG tablet TAKE 1 TABLET BY MOUTH TWICE A DAY WITH A MEAL (Patient taking differently: Take 3.125 mg by mouth in  the morning and at bedtime. ) 180 tablet 3  . clopidogrel (PLAVIX) 75 MG tablet Take 1 tablet (75 mg total) by mouth daily. 30 tablet 6  . nitroGLYCERIN (NITRODUR - DOSED IN MG/24 HR) 0.2 mg/hr patch PLACE 1 PATCH (0.2 MG TOTAL) ONTO THE SKIN DAILY. 90 patch 1  . nitroGLYCERIN (NITROSTAT) 0.4 MG SL  tablet Place 1 tablet (0.4 mg total) under the tongue every 5 (five) minutes x 3 doses as needed for chest pain. 25 tablet 12  . pantoprazole (PROTONIX) 40 MG tablet TAKE 1 TABLET BY MOUTH EVERY DAY 90 tablet 2  . sacubitril-valsartan (ENTRESTO) 24-26 MG Take 1 tablet by mouth 2 (two) times daily. 60 tablet 11  . spironolactone (ALDACTONE) 25 MG tablet TAKE 1/2 TABLET BY MOUTH EVERY DAY 45 tablet 4   No current facility-administered medications for this visit.    Allergies:   Patient has no known allergies.   Social History:  The patient  reports that he has been smoking cigarettes. He has a 15.00 pack-year smoking history. He has never used smokeless tobacco. He reports that he does not drink alcohol and does not use drugs.   Family History:  The patient's family history includes Cancer in his father and mother; Diabetes in his father and mother; Heart attack in his brother and brother; Stroke in his sister.   ROS:  Please see the history of present illness.   Otherwise, review of systems is positive for none.   All other systems are reviewed and negative.   PHYSICAL EXAM: VS:  BP 104/70   Pulse 78   Resp (!) 95   Ht 6' (1.829 m)   Wt 219 lb (99.3 kg)   BMI 29.70 kg/m  , BMI Body mass index is 29.7 kg/m. GEN: Well nourished, well developed, in no acute distress  HEENT: normal  Neck: no JVD, carotid bruits, or masses Cardiac: RRR; no murmurs, rubs, or gallops,no edema  Respiratory:  clear to auscultation bilaterally, normal work of breathing GI: soft, nontender, nondistended, + BS MS: no deformity or atrophy  Skin: warm and dry, device site well healed Neuro:  Strength and sensation are intact Psych: euthymic mood, full affect  EKG:  EKG is ordered today. Personal review of the ekg ordered shows sinus rhythm, rate 64, anterior Q waves  Personal review of the device interrogation today. Results in [facility]: 05/19/2019: ALT 22 12/17/2019: BUN 13; Creatinine, Ser  1.37; Hemoglobin 14.5; Platelets 277; Potassium 3.7; Sodium 142    Lipid Panel     Component Value Date/Time   CHOL 114 05/19/2019 0908   TRIG 128 05/19/2019 0908   HDL 28 (L) 05/19/2019 0908   CHOLHDL 4.1 05/19/2019 0908   CHOLHDL 4.3 08/10/2014 0810   VLDL 27 08/10/2014 0810   LDLCALC 60 05/19/2019 0908     Wt Readings from Last 3 Encounters:  03/22/20 219 lb (99.3 kg)  12/17/19 210 lb (95.3 kg)  10/26/19 206 lb 3.2 oz (93.5 kg)      Other studies Reviewed: Additional studies/ records that were reviewed today include: TTE 09/30/19  Review of the above records today demonstrates:   1. Left ventricular ejection fraction, by visual estimation, is 25 to 30%. The left ventricle has severely decreased function. There is aneurysmal dilatation of all of the apical segments and akinesis of the mid anteroseptal, anterior, anterolateral  walls. NO paical thrombus is seen.  2. There is mildly increased left ventricular wall thickness.  3. Left  ventricular diastolic parameters are consistent with Grade I diastolic dysfunction (impaired relaxation).  4. The left ventricle demonstrates regional wall motion abnormalities.  5. Left atrial size was mildly dilated.  6. Mild mitral valve regurgitation.  7. Tricuspid valve regurgitation is mild.  8. Tricuspid valve regurgitation is mild.   ASSESSMENT AND PLAN:  1.  Ischemic cardiomyopathy: Ejection fraction 25 to 30%.  Is currently on optimal medical therapy with Entresto, Aldactone, carvedilol.  Is now status post Saint Jude ICD implanted 12/17/2019.  Device functioning appropriately.  No changes.  2.  Coronary artery disease: No current chest pain.  Continue management per primary cardiology.   3.  Hyperlipidemia: Statin per primary cardiology   Current medicines are reviewed at length with the patient today.   The patient does not have concerns regarding his medicines.  The following changes were made today:  none  Labs/ tests ordered  today include:  Orders Placed This Encounter  Procedures  . CUP PACEART INCLINIC DEVICE CHECK  . EKG 12-Lead     Disposition:   FU with Amarie Tarte 9 months  Signed, Yaire Kreher Jorja Loa, MD  03/22/2020 2:23 PM     [facility] [address] [place] [address] [phone] (office) [phone] (fax)

## 2020-03-29 ENCOUNTER — Ambulatory Visit
Admission: RE | Admit: 2020-03-29 | Discharge: 2020-03-29 | Disposition: A | Discharge status: Home / Self Care | Payer: BC Managed Care – PPO | Source: Ambulatory Visit | Attending: Family Medicine | Admitting: Family Medicine

## 2020-03-29 ENCOUNTER — Other Ambulatory Visit: Payer: Self-pay | Admitting: Family Medicine

## 2020-03-29 DIAGNOSIS — R109 Unspecified abdominal pain: Secondary | ICD-10-CM

## 2020-03-29 DIAGNOSIS — R319 Hematuria, unspecified: Secondary | ICD-10-CM | POA: Diagnosis not present

## 2020-03-29 DIAGNOSIS — K8020 Calculus of gallbladder without cholecystitis without obstruction: Secondary | ICD-10-CM | POA: Diagnosis not present

## 2020-03-29 MED ORDER — IOPAMIDOL (ISOVUE-300) INJECTION 61%
100.0000 mL | Freq: Once | INTRAVENOUS | Status: AC | PRN
  Administered 2020-03-29: 100 mL via INTRAVENOUS

## 2020-04-12 ENCOUNTER — Encounter: Payer: Self-pay | Admitting: Internal Medicine

## 2020-04-12 ENCOUNTER — Other Ambulatory Visit: Payer: Self-pay

## 2020-04-12 ENCOUNTER — Ambulatory Visit (INDEPENDENT_AMBULATORY_CARE_PROVIDER_SITE_OTHER): Payer: BC Managed Care – PPO | Admitting: Internal Medicine

## 2020-04-12 VITALS — BP 122/78 | HR 67 | Ht 72.0 in | Wt 220.0 lb

## 2020-04-12 DIAGNOSIS — E785 Hyperlipidemia, unspecified: Secondary | ICD-10-CM | POA: Diagnosis not present

## 2020-04-12 DIAGNOSIS — Z79899 Other long term (current) drug therapy: Secondary | ICD-10-CM

## 2020-04-12 DIAGNOSIS — I5022 Chronic systolic (congestive) heart failure: Secondary | ICD-10-CM | POA: Diagnosis not present

## 2020-04-12 DIAGNOSIS — I255 Ischemic cardiomyopathy: Secondary | ICD-10-CM | POA: Diagnosis not present

## 2020-04-12 NOTE — Progress Notes (Signed)
Marland Kitchen    OFFICE NOTE  Chief Complaint:  Fatigue  Primary Care Physician: Joycelyn Rua, MD  HPI:  Alan White is a 59 y.o. male with no history of CAD. CRFs are tobacco, FH. No known history of HTN, HL, DM. He presented 12/28/13 with a 4 day history of intermittent chest pressure. It was left substernal, radiating to both arms. It was 8/10 at its worst. It had occurred at rest and with exertion. He tried OTC PPI and Tums, without relief. One time, he took ASA which helped. It seems to happen more often in the afternoon/evening. It has also woken him from sleep. He had SOB with it it if occurred with exertion, but also describes DOE. He has had no N&V or diaphoresis. He told his wife about it 3 days ago, has had multiple episodes. He took a day off from work and came to the ER because it was scaring him. He was admitted by Dr. Rennis Golden with addition of IV heparin and NTG. Cardiac enzymes were negative- exceptfor 0.11 of troponin marker. Pt underwent cardiac cath with findings of:   1. Severe proximal to mid LAD and mid circumflex stenoses as outlined above.  2. Normal right coronary artery.  3. Wall motion abnormality involving the inferoapical wall. Preserved LVEF at 50-55%  4. Successful circumflex DES implantation post dilated to 3.75 mm in diameter and an aneurysmal portion of the vessel. 0% stenosis was noted post deployment.  5. Successful deployment of overlapping stents in the proximal to mid LAD from 99% to 0% with TIMI grade 3 flow. Postdilatation diameter was 3.0   Dual antiplatelet therapy for greater than 12 months. Brilinta can be switched to Plavix at 6 months.   Pt did well post procedure. EKG with incomplete RBBB, t wave inversions in ant lat leads. Was seen by Dr. Swaziland and found stable for discharge. Will ambulate with cardiac rehab first. Have called [facility] to arrange new pt appointment, they are to call pt.   Other issues borderline diabetes will have dietician  see before discharge. Pt on statin, BB and asa, Brilinta. BP is borderline, ACE will be added as outpatient. No work until  01/04/14.  Mr. Dipasquale had been reporting very similar left chest discomfort that he had prior to his original stents. He reported a soreness under the left breast which improved with elevating the left breast. This is very atypical however seem to improve significantly after having his stents placed. He said that he felt great for about 3 months after having stents placed but is since become more short of breath and continues to have chest discomfort. He presented the hospital with similar complaints in early October and underwent a repeat cardiac catheterization which showed widely patent stents. He was discharged without changes in his medication. He comes back today with again similar complaints in the left chest. Unclear whether this is angina or not. He may have had some improvement with a nitrate. Again he is describing some shortness of breath.  Mr. Docken returns today and reports she is feeling the best he has in years. He took his isosorbide for about a week and then had awful headaches and discontinued it. He did notice a change during that week and improvement in his chest pain symptoms, however. He also was switched from Brilinta to Plavix and seems to be tolerating this well. His P2 Y 12 assay indicated 221,which is adequate platelet suppression.  I saw Mr. Fassnacht back in the office  today. Overall he tends to feel fatigued at times. He said initially it felt the best that he had in years after his PCI but now seems to tired fairly easily. He was ordered for sleep study however that has not yet been performed. He also has some significant anxiety and continues to smoke. He's been very difficult for him to quit smoking. I do believe anxiety is playing a role in this. He denies anymore cardiac chest pain.  Mr. Doenges returns today for follow-up. Overall he is feeling excellent  today. He denies any chest pain or shortness of breath. He's been dealing with a kidney stone but he seeing a urologist for this. He has significantly cut back his smoking down to 5 cigarettes a day. He is not started Zyban, but he has filled the prescription and said that he would start the medicine if he cannot stop smoking on his own.  05/29/2018  Mr. Matus is seen today in follow-up.  He denies any chest pain or worsening shortness of breath.  He recently saw Azalee Course, PA-C in February 2019.  He is complaining of some leg pain and underwent venous Dopplers which were negative for DVT.  He had some associated edema.  That is resolved.  He denies any worsening chest pain.  He has not had any lab work in the past couple of years.  08/20/2019  Mr. Coppedge is seen today in follow-up.  I saw him recently for virtual visit.  He had interim decline in LVEF down to 25 to 30% with severe akinesis of the mid apical anteroseptal wall lateral wall and apical septum.  It was felt that there might be some nonischemic and ischemic mixed cardiomyopathy.  I did switch him over to [facility] and he is on spironolactone.  Unfortunately he had hypotension with this and was feeling very fatigued.  His spironolactone was then decreased to 12.5 mg every other day.  After doing this his blood pressure has come up now into the low 100s and he feels much better.  He is currently not working and is out until February on short-term disability.  10/26/2019  Mr. Onorato returns today for follow-up.  He underwent a repeat echo which unfortunately shows no significant improvement in LV function with EF 25 to 30%.  He is ready been referred to and seen by Dr. Elberta Fortis in early January for evaluation of AICD.  He agrees that we should proceed with this due to increased risk of sudden cardiac death.  Mr. Ringwald has started doing some more exercise.  He says he does almost on a daily basis with either walking on a treadmill or riding a  stationary bicycle between 15 and 30 minutes.  Overall he says he feels now the best that he has in a while.  Recently was started on a nitro patch which he says is helped significantly as well therefore he may have either had some persistent angina or heart failure symptoms that were improved with this.  He is currently on maximal tolerated goal-directed therapy for congestive heart failure, however due to hypotension with blood pressures typically in the 90s to 100 systolic, I am unable to uptitrate his medicine further.  04/12/2020  Mr. Baranek returns today for follow-up.  Overall he says he feels some fatigue.  He is glad however that he went through with the AICD placement.  That was back in March with Dr. Elberta Fortis.  He had no issues with it since then has done well.  Remote checks have been normal.  Unfortunately his LVEF has not improved significantly.  Blood pressure was unusually high for him today as it typically runs around 100 systolic.  This been little to no room to uptitrate his medications for heart failure.  He reports getting fatigue and seems like he is quite busy.  He works still about 8 to 10 hours a day.  He is very fatigued after this and is being pressured somewhat by his family to consider retiring.  Given his significant cardiomyopathy, he would very likely qualify for disability however he has wanted to work.  Unfortunately since his symptoms are not improving and is likely going to be a long-term issue, I would support him as I feel he would qualify for long-term disability.  PMHx:  Past Medical History:  Diagnosis Date  . CAD (coronary artery disease), 12/29/13 PCI/DES LCX and PCI/DES LAD with overlapping DES  12/30/2013   cath 07/05/14 [place]  . Dyslipidemia, goal LDL below 70 12/30/2013  . GERD (gastroesophageal reflux disease)   . Metabolic syndrome, HgbA1C 6.0  12/30/2013  . Sleep apnea    by history  . Smoker     Past Surgical History:  Procedure Laterality Date  . CARDIAC  CATHETERIZATION  07/05/14   patent stents  . CORONARY ANGIOPLASTY WITH STENT PLACEMENT  12/29/13   DES to LCX and overlapping stents DES mid LAD  . CORONARY/GRAFT ACUTE MI REVASCULARIZATION N/A 01/31/2019   Procedure: Coronary/Graft Acute MI Revascularization;  Surgeon: Kathleene Hazel, MD;  Location: [facility] INVASIVE CV LAB;  Service: Cardiovascular;  Laterality: N/A;  . ICD IMPLANT N/A 12/17/2019   Procedure: ICD IMPLANT;  Surgeon: Regan Lemming, MD;  Location: [facility] INVASIVE CV LAB;  Service: Cardiovascular;  Laterality: N/A;  . LEFT HEART CATH AND CORONARY ANGIOGRAPHY N/A 01/31/2019   Procedure: LEFT HEART CATH AND CORONARY ANGIOGRAPHY;  Surgeon: Kathleene Hazel, MD;  Location: [facility] INVASIVE CV LAB;  Service: Cardiovascular;  Laterality: N/A;  . LEFT HEART CATHETERIZATION WITH CORONARY ANGIOGRAM N/A 12/29/2013   Procedure: LEFT HEART CATHETERIZATION WITH CORONARY ANGIOGRAM;  Surgeon: [name], MD;  Location: [facility] CATH LAB;  Service: Cardiovascular;  Laterality: N/A;  . LEFT HEART CATHETERIZATION WITH CORONARY ANGIOGRAM N/A 07/05/2014   Procedure: LEFT HEART CATHETERIZATION WITH CORONARY ANGIOGRAM;  Surgeon: Peter M Swaziland, MD;  Location: [facility] CATH LAB;  Service: Cardiovascular;  Laterality: N/A;  . PERCUTANEOUS CORONARY STENT INTERVENTION (PCI-S)  12/29/2013   Procedure: PERCUTANEOUS CORONARY STENT INTERVENTION (PCI-S);  Surgeon: [name], MD;  Location: [facility] CATH LAB;  Service: Cardiovascular;;    FAMHx:  Family History  Problem Relation Age of Onset  . Heart attack Brother        Deceased  . Heart attack Brother   . Stroke Sister   . Diabetes Father        also heart disease  . Cancer Father        Deceased  . Diabetes Mother   . Cancer Mother        Deceased    SOCHx:   reports that he has been smoking cigarettes. He has a 15.00 pack-year smoking history. He has never used smokeless tobacco. He reports that he does not drink alcohol and does not use  drugs.  ALLERGIES:  No Known Allergies  ROS: A comprehensive review of systems was negative.  HOME MEDS: Current Outpatient Medications  Medication Sig Dispense Refill  . aspirin EC 81 MG EC tablet Take 1  tablet (81 mg total) by mouth daily. 30 tablet 11  . atorvastatin (LIPITOR) 80 MG tablet TAKE 1 TABLET BY MOUTH EVERY DAY 90 tablet 1  . carvedilol (COREG) 3.125 MG tablet TAKE 1 TABLET BY MOUTH TWICE A DAY WITH A MEAL (Patient taking differently: Take 3.125 mg by mouth in the morning and at bedtime. ) 180 tablet 3  . clopidogrel (PLAVIX) 75 MG tablet Take 1 tablet (75 mg total) by mouth daily. 30 tablet 6  . nitroGLYCERIN (NITRODUR - DOSED IN MG/24 HR) 0.2 mg/hr patch PLACE 1 PATCH (0.2 MG TOTAL) ONTO THE SKIN DAILY. 90 patch 1  . nitroGLYCERIN (NITROSTAT) 0.4 MG SL tablet Place 1 tablet (0.4 mg total) under the tongue every 5 (five) minutes x 3 doses as needed for chest pain. 25 tablet 12  . pantoprazole (PROTONIX) 40 MG tablet TAKE 1 TABLET BY MOUTH EVERY DAY 90 tablet 2  . sacubitril-valsartan (ENTRESTO) 24-26 MG Take 1 tablet by mouth 2 (two) times daily. 60 tablet 11  . spironolactone (ALDACTONE) 25 MG tablet TAKE 1/2 TABLET BY MOUTH EVERY DAY 45 tablet 4   No current facility-administered medications for this visit.    LABS/IMAGING: No results found for this or any previous visit (from the past 48 hour(s)). No results found.  VITALS: BP 122/78   Pulse 67   Ht 6' (1.829 m)   Wt 220 lb (99.8 kg)   SpO2 98%   BMI 29.84 kg/m   EXAM: General appearance: alert and no distress Neck: no carotid bruit, no JVD and thyroid not enlarged, symmetric, no tenderness/mass/nodules Lungs: clear to auscultation bilaterally Heart: regular rate and rhythm, S1, S2 normal, no murmur, click, rub or gallop Abdomen: soft, non-tender; bowel sounds normal; no masses,  no organomegaly Extremities: extremities normal, atraumatic, no cyanosis or edema Pulses: 2+ and symmetric Skin: Skin color,  texture, turgor normal. No rashes or lesions Neurologic: Grossly normal Psych: Pleasant  EKG: Sinus rhythm first-degree AV block and PVCs at 67-personally reviewed  ASSESSMENT: 1. Coronary artery disease status post two-vessel PCI to the left circumflex and LAD (2015) 2. Dyslipidemia on statin 3. Tobacco abuse - working on quitting 4. Anxiety 5. Mixed ischemic and nonischemic cardiomyopathy EF 25 to 30%, NYHA class II-III symptoms 6. Status post AICD Meryl Dare, VR) - 11/2019  PLAN: 1.   Mr. Honer seems to be stable and appears euvolemic on exam but does get fatigued easily and is difficult to understand how he is able to do a lot of physical work given his severe cardiomyopathy.  Unfortunately LVEF has not improved with goal-directed therapy and blood pressure would not allow further increase in medications at this time.  He did have successful placement of AICD which should help reduce his risk of sudden cardiac death.  No other medications at this time.  Follow-up with me in 6 months.  Chrystie Nose, MD, [facility], FACP  [place]  [facility]  Medical Director of the [facility] Lipid [facility] &  Cardiovascular Risk Reduction Clinic Diplomate of the American Board of Clinical Lipidology Attending Cardiologist  Direct Dial: [phone]  Fax: [phone]  Website:  [email or web address] [email or web address] 04/12/2020, 1:30 PM

## 2020-04-12 NOTE — Patient Instructions (Signed)
Medication Instructions:  Your physician recommends that you continue on your current medications as directed. Please refer to the Current Medication list given to you today.  *If you need a refill on your cardiac medications before your next appointment, please call your pharmacy*   Lab Work: FASTING lab work - lipid panel, BMET, BNP Please complete at Dr. Blanchie Dessert office - open M-F from 8am-12:45pm and 12:45-4:30pm  If you have labs (blood work) drawn today and your tests are completely normal, you will receive your results only by:  MyChart Message (if you have MyChart) OR  A paper copy in the mail If you have any lab test that is abnormal or we need to change your treatment, we will call you to review the results.   Testing/Procedures: NONE   Follow-Up: At [facility], you and your health needs are our priority.  As part of our continuing mission to provide you with exceptional heart care, we have created designated Provider Care Teams.  These Care Teams include your primary Cardiologist (physician) and Advanced Practice Providers (APPs -  Physician Assistants and Nurse Practitioners) who all work together to provide you with the care you need, when you need it.  We recommend signing up for the patient portal called "MyChart".  Sign up information is provided on this After Visit Summary.  MyChart is used to connect with patients for Virtual Visits (Telemedicine).  Patients are able to view lab/test results, encounter notes, upcoming appointments, etc.  Non-urgent messages can be sent to your provider as well.   To learn more about what you can do with MyChart, go to [email or web address].    Your next appointment:   6 month(s)  The format for your next appointment:   In Person  Provider:   You may see Chrystie Nose, MD or one of the following Advanced Practice Providers on your designated Care Team:    Azalee Course, PA-C  Micah Flesher, PA-C or   Judy Pimple,  [place]    Other Instructions

## 2020-04-20 DIAGNOSIS — I5022 Chronic systolic (congestive) heart failure: Secondary | ICD-10-CM | POA: Diagnosis not present

## 2020-04-20 DIAGNOSIS — E785 Hyperlipidemia, unspecified: Secondary | ICD-10-CM | POA: Diagnosis not present

## 2020-04-20 DIAGNOSIS — Z79899 Other long term (current) drug therapy: Secondary | ICD-10-CM | POA: Diagnosis not present

## 2020-04-21 LAB — BASIC METABOLIC PANEL
BUN/Creatinine Ratio: 14 (ref 10–24)
BUN: 17 mg/dL (ref 8–27)
CO2: 25 mmol/L (ref 20–29)
Calcium: 9.3 mg/dL (ref 8.6–10.2)
Chloride: 102 mmol/L (ref 96–106)
Creatinine, Ser: 1.25 mg/dL (ref 0.76–1.27)
GFR calc Af Amer: 72 mL/min/{1.73_m2} (ref >59)
GFR calc non Af Amer: 62 mL/min/{1.73_m2} (ref >59)
Glucose: 124 mg/dL — ABNORMAL HIGH (ref 65–99)
Potassium: 5 mmol/L (ref 3.5–5.2)
Sodium: 138 mmol/L (ref 134–144)

## 2020-04-21 LAB — LIPID PANEL
Chol/HDL Ratio: 3.7 ratio (ref 0.0–5.0)
Cholesterol, Total: 117 mg/dL (ref 100–199)
HDL: 32 mg/dL — ABNORMAL LOW (ref >39)
LDL Chol Calc (NIH): 67 mg/dL (ref 0–99)
Triglycerides: 95 mg/dL (ref 0–149)
VLDL Cholesterol Cal: 18 mg/dL (ref 5–40)

## 2020-04-21 LAB — BRAIN NATRIURETIC PEPTIDE: BNP: 107 pg/mL — ABNORMAL HIGH (ref 0.0–100.0)

## 2020-04-29 ENCOUNTER — Telehealth: Payer: Self-pay | Admitting: Internal Medicine

## 2020-04-29 NOTE — Telephone Encounter (Signed)
FMLA paperwork was left in Dr.Hilty mailbox in 04/29/2020.

## 2020-05-26 ENCOUNTER — Other Ambulatory Visit: Payer: Self-pay | Admitting: Cardiology

## 2020-05-27 ENCOUNTER — Other Ambulatory Visit: Payer: Self-pay | Admitting: Internal Medicine

## 2020-06-13 ENCOUNTER — Ambulatory Visit (INDEPENDENT_AMBULATORY_CARE_PROVIDER_SITE_OTHER): Payer: BC Managed Care – PPO | Admitting: Student

## 2020-06-13 ENCOUNTER — Encounter: Payer: Self-pay | Admitting: Student

## 2020-06-13 ENCOUNTER — Other Ambulatory Visit: Payer: Self-pay

## 2020-06-13 VITALS — BP 120/74 | HR 71 | Ht 72.0 in | Wt 215.0 lb

## 2020-06-13 DIAGNOSIS — I249 Acute ischemic heart disease, unspecified: Secondary | ICD-10-CM

## 2020-06-13 DIAGNOSIS — I5043 Acute on chronic combined systolic (congestive) and diastolic (congestive) heart failure: Secondary | ICD-10-CM | POA: Diagnosis not present

## 2020-06-13 DIAGNOSIS — I255 Ischemic cardiomyopathy: Secondary | ICD-10-CM

## 2020-06-13 NOTE — Patient Instructions (Addendum)
Medication Instructions:  *If you need a refill on your cardiac medications before your next appointment, please call your pharmacy*  Lab Work: Your physician has recommended that you have lab work today: BMET and Pro BNP If you have labs (blood work) drawn today and your tests are completely normal, you will receive your results only by: Marland Kitchen MyChart Message (if you have MyChart) OR . A paper copy in the mail If you have any lab test that is abnormal or we need to change your treatment, we will call you to review the results.  Follow-Up: At [facility], you and your health needs are our priority.  As part of our continuing mission to provide you with exceptional heart care, we have created designated Provider Care Teams.  These Care Teams include your primary Cardiologist (physician) and Advanced Practice Providers (APPs -  Physician Assistants and Nurse Practitioners) who all work together to provide you with the care you need, when you need it.  We recommend signing up for the patient portal called "MyChart".  Sign up information is provided on this After Visit Summary.  MyChart is used to connect with patients for Virtual Visits (Telemedicine).  Patients are able to view lab/test results, encounter notes, upcoming appointments, etc.  Non-urgent messages can be sent to your provider as well.   To learn more about what you can do with MyChart, go to [email or web address].    Your next appointment:   Your physician recommends that you schedule a follow-up appointment -- pending your lab results.   The format for your next appointment:   In Person with Casimiro Needle "Mardelle Matte" Lanna Poche, PA-C

## 2020-06-13 NOTE — Progress Notes (Signed)
Electrophysiology Office Note Date: 06/13/2020  ID:  Alan White, DOB [birth year], MRN [id number]  PCP: Joycelyn Rua, MD Primary Cardiologist: Chrystie Nose, MD Electrophysiologist: Regan Lemming, MD   CC: Routine ICD follow-up  Alan White is a 59 y.o. male seen today for Alan Jorja Loa, MD for discussion of barostim consideration.  Since last being seen in our clinic the patient reports doing well overall. He states he has 2-3 days a week where he is just "wiped out" with fatigue. There are times when he is SOB carrying 2-3 bags of groceries into the house, and his wife is already taking her second trip out from the car. Denies chest pain. No palpitations, dyspnea, PND, orthopnea, nausea, vomiting, dizziness, syncope, edema, weight gain, or early satiety. He has not had ICD shocks.   Device History: St. Jude Dual Chamber ICD implanted 12/17/2019 for Chronic systolic CHF History of appropriate therapy: No History of AAD therapy: No   Past Medical History:  Diagnosis Date  . CAD (coronary artery disease), 12/29/13 PCI/DES LCX and PCI/DES LAD with overlapping DES  12/30/2013   cath 07/05/14 [place]  . Dyslipidemia, goal LDL below 70 12/30/2013  . GERD (gastroesophageal reflux disease)   . Metabolic syndrome, HgbA1C 6.0  12/30/2013  . Sleep apnea    by history  . Smoker    Past Surgical History:  Procedure Laterality Date  . CARDIAC CATHETERIZATION  07/05/14   patent stents  . CORONARY ANGIOPLASTY WITH STENT PLACEMENT  12/29/13   DES to LCX and overlapping stents DES mid LAD  . CORONARY/GRAFT ACUTE MI REVASCULARIZATION N/A 01/31/2019   Procedure: Coronary/Graft Acute MI Revascularization;  Surgeon: Kathleene Hazel, MD;  Location: [facility];  Service: Cardiovascular;  Laterality: N/A;  . ICD IMPLANT N/A 12/17/2019   Procedure: ICD IMPLANT;  Surgeon: Regan Lemming, MD;  Location: [facility];  Service: Cardiovascular;  Laterality: N/A;  . LEFT  HEART CATH AND CORONARY ANGIOGRAPHY N/A 01/31/2019   Procedure: LEFT HEART CATH AND CORONARY ANGIOGRAPHY;  Surgeon: Kathleene Hazel, MD;  Location: [facility];  Service: Cardiovascular;  Laterality: N/A;  . LEFT HEART CATHETERIZATION WITH CORONARY ANGIOGRAM N/A 12/29/2013   Procedure: LEFT HEART CATHETERIZATION WITH CORONARY ANGIOGRAM;  Surgeon: [name], MD;  Location: [facility] CATH LAB;  Service: Cardiovascular;  Laterality: N/A;  . LEFT HEART CATHETERIZATION WITH CORONARY ANGIOGRAM N/A 07/05/2014   Procedure: LEFT HEART CATHETERIZATION WITH CORONARY ANGIOGRAM;  Surgeon: Peter M Swaziland, MD;  Location: [facility] CATH LAB;  Service: Cardiovascular;  Laterality: N/A;  . PERCUTANEOUS CORONARY STENT INTERVENTION (PCI-S)  12/29/2013   Procedure: PERCUTANEOUS CORONARY STENT INTERVENTION (PCI-S);  Surgeon: [name], MD;  Location: [facility] CATH LAB;  Service: Cardiovascular;;    Current Outpatient Medications  Medication Sig Dispense Refill  . aspirin EC 81 MG EC tablet Take 1 tablet (81 mg total) by mouth daily. 30 tablet 11  . atorvastatin (LIPITOR) 80 MG tablet TAKE 1 TABLET BY MOUTH EVERY DAY 90 tablet 1  . carvedilol (COREG) 3.125 MG tablet TAKE 1 TABLET BY MOUTH TWICE A DAY WITH A MEAL (Patient taking differently: Take 3.125 mg by mouth in the morning and at bedtime. ) 180 tablet 3  . clopidogrel (PLAVIX) 75 MG tablet TAKE 1 TABLET BY MOUTH EVERY DAY 90 tablet 2  . ENTRESTO 24-26 MG TAKE 1 TABLET BY MOUTH TWICE A DAY 60 tablet 11  . nitroGLYCERIN (NITRODUR - DOSED IN MG/24  HR) 0.2 mg/hr patch PLACE 1 PATCH (0.2 MG TOTAL) ONTO THE SKIN DAILY. 90 patch 1  . nitroGLYCERIN (NITROSTAT) 0.4 MG SL tablet Place 1 tablet (0.4 mg total) under the tongue every 5 (five) minutes x 3 doses as needed for chest pain. 25 tablet 12  . pantoprazole (PROTONIX) 40 MG tablet TAKE 1 TABLET BY MOUTH EVERY DAY 90 tablet 2  . spironolactone (ALDACTONE) 25 MG tablet TAKE 1/2 TABLET BY MOUTH EVERY DAY 45 tablet 4     No current facility-administered medications for this visit.    Allergies:   Patient has no known allergies.   Social History: Social History   Socioeconomic History  . Marital status: Married    Spouse name:    . Number of children: Not on file  . Years of education: Not on file  . Highest education level: Not on file  Occupational History  . Occupation: Event organiser: [employer]  Tobacco Use  . Smoking status: Current Every Day Smoker    Packs/day: 0.50    Years: 30.00    Pack years: 15.00    Types: Cigarettes  . Smokeless tobacco: Never Used  Substance and Sexual Activity  . Alcohol use: No    Comment: "Once in a blue moon"  . Drug use: No  . Sexual activity: Not on file  Other Topics Concern  . Not on file  Social History Narrative   Pt lives with wife.   Social Determinants of Health   Financial Resource Strain:   . Difficulty of Paying Living Expenses: Not on file  Food Insecurity:   . Worried About Programme researcher, broadcasting/film/video in the Last Year: Not on file  . Ran Out of Food in the Last Year: Not on file  Transportation Needs:   . Lack of Transportation (Medical): Not on file  . Lack of Transportation (Non-Medical): Not on file  Physical Activity:   . Days of Exercise per Week: Not on file  . Minutes of Exercise per Session: Not on file  Stress:   . Feeling of Stress : Not on file  Social Connections:   . Frequency of Communication with Friends and Family: Not on file  . Frequency of Social Gatherings with Friends and Family: Not on file  . Attends Religious Services: Not on file  . Active Member of Clubs or Organizations: Not on file  . Attends Banker Meetings: Not on file  . Marital Status: Not on file  Intimate Partner Violence:   . Fear of Current or Ex-Partner: Not on file  . Emotionally Abused: Not on file  . Physically Abused: Not on file  . Sexually Abused: Not on file    Family History: Family History  Problem  Relation Age of Onset  . Heart attack Brother        Deceased  . Heart attack Brother   . Stroke Sister   . Diabetes Father        also heart disease  . Cancer Father        Deceased  . Diabetes Mother   . Cancer Mother        Deceased    Review of Systems: All other systems reviewed and are otherwise negative except as noted above.   Physical Exam: Vitals:   06/13/20 1233  BP: 120/74  Pulse: 71  SpO2: 97%  Weight: 215 lb (97.5 kg)  Height: 6' (1.829 m)  GEN- The patient is well appearing, alert and oriented x 3 today.   HEENT: normocephalic, atraumatic; sclera clear, conjunctiva pink; hearing intact; oropharynx clear; neck supple, no JVP Lymph- no cervical lymphadenopathy Lungs- Clear to ausculation bilaterally, normal work of breathing.  No wheezes, rales, rhonchi Heart- Regular rate and rhythm, no murmurs, rubs or gallops, PMI not laterally displaced GI- soft, non-tender, non-distended, bowel sounds present, no hepatosplenomegaly Extremities- no clubbing or cyanosis. No edema; DP/PT/radial pulses 2+ bilaterally MS- no significant deformity or atrophy Skin- warm and dry, no rash or lesion; ICD pocket well healed Psych- euthymic mood, full affect Neuro- strength and sensation are intact  ICD interrogation- reviewed in detail today,  See PACEART report  EKG:  EKG is not ordered today.  Recent Labs: 12/17/2019: Hemoglobin 14.5; Platelets 277 04/20/2020: BNP 107.0; BUN 17; Creatinine, Ser 1.25; Potassium 5.0; Sodium 138   Wt Readings from Last 3 Encounters:  06/13/20 215 lb (97.5 kg)  04/12/20 220 lb (99.8 kg)  03/22/20 219 lb (99.3 kg)     Other studies Reviewed: Additional studies/ records that were reviewed today include: Previous Echo, most recent labwork, Previous EP office notes, previous EKG.   Assessment and Plan:  1.  Chronic systolic dysfunction s/p St. Jude dual chamber ICD  euvolemic today Stable on an appropriate medical regimen Normal ICD  function See Pace Art report No changes today  2. Tobacco abuse Encouraged cessation  Chrissie Noa Christiana's heart failure has failed to improve despite titration of guideline directed medication such that he qualifies for the [facility] NEO device. The following information from the patient's medical record supports the medical necessity of this procedure for my patient, consistent with the FDA on-label indication for BAROSTIM NEO:  ? LVEF of 25-30%  confirmed by Echo on 09/2019   ? NT-proBNP of <1600 pg/ml  = Labs to be drawn today, 06/13/20  ?Symptomatic despite medication management of: beta blocker, ACEi/ARB/ARNi and Aldosterone inhibitor as evidenced by symptoms below.   ? This patients signs and symptoms of heart failure include dyspnea with mild to moderate exertion, fatigue and weakness   ? NYHA Congestive Heart Failure Classification: III  ? Recent hospitalization for Heart Failure on (not applicable for this patient)   This patient is NOT indicated for cardiac resynchronization therapy because QRS = 92 by EKG on 04/12/2020      Current medicines are reviewed at length with the patient today.   The patient does not have concerns regarding his medicines.  The following changes were made today:  none  Labs/ tests ordered today include:  Orders Placed This Encounter  Procedures  . Basic Metabolic Panel (BMET)  . Pro b natriuretic peptide (BNP)     Disposition:   Follow up with Dr. Elberta Fortis as scheduled for device follow up. Barostim consideration pending labwork. Body mass index is 29.16 kg/m.  I think he would be a good candidate for the [facility] approach.    Dustin Flock, PA-C  06/13/2020 1:38 PM  [facility] [address] [place] [address] [phone] (office) [phone] (fax)

## 2020-06-14 LAB — BASIC METABOLIC PANEL
BUN/Creatinine Ratio: 10 (ref 10–24)
BUN: 13 mg/dL (ref 8–27)
CO2: 26 mmol/L (ref 20–29)
Calcium: 9.5 mg/dL (ref 8.6–10.2)
Chloride: 101 mmol/L (ref 96–106)
Creatinine, Ser: 1.3 mg/dL — ABNORMAL HIGH (ref 0.76–1.27)
GFR calc Af Amer: 69 mL/min/{1.73_m2} (ref >59)
GFR calc non Af Amer: 59 mL/min/{1.73_m2} — ABNORMAL LOW (ref >59)
Glucose: 75 mg/dL (ref 65–99)
Potassium: 4.4 mmol/L (ref 3.5–5.2)
Sodium: 140 mmol/L (ref 134–144)

## 2020-06-14 LAB — PRO B NATRIURETIC PEPTIDE: NT-Pro BNP: 500 pg/mL — ABNORMAL HIGH (ref 0–210)

## 2020-06-15 ENCOUNTER — Telehealth: Payer: Self-pay | Admitting: *Deleted

## 2020-06-15 DIAGNOSIS — Z006 Encounter for examination for normal comparison and control in clinical research program: Secondary | ICD-10-CM

## 2020-06-15 DIAGNOSIS — I5043 Acute on chronic combined systolic (congestive) and diastolic (congestive) heart failure: Secondary | ICD-10-CM

## 2020-06-15 NOTE — Telephone Encounter (Signed)
Alan White wishes to come in and discuss the possibility of obtaining the Batwire device and speaking to Otilio Saber, [place] on 06-13-2020.  Patient will be going on vacation soon and so would like to come in on October 12th for consenting (patient was emailed consent to review) and screening process.

## 2020-06-16 ENCOUNTER — Ambulatory Visit (INDEPENDENT_AMBULATORY_CARE_PROVIDER_SITE_OTHER): Payer: BC Managed Care – PPO | Admitting: *Deleted

## 2020-06-16 DIAGNOSIS — I255 Ischemic cardiomyopathy: Secondary | ICD-10-CM

## 2020-06-16 LAB — CUP PACEART REMOTE DEVICE CHECK
Battery Remaining Longevity: 101 mo
Battery Remaining Percentage: 91 %
Battery Voltage: 3.01 V
Brady Statistic RV Percent Paced: 1 %
Date Time Interrogation Session: 20210916071807
HighPow Impedance: 74 Ohm
Implantable Lead Implant Date: 20210318
Implantable Lead Location: 753860
Implantable Pulse Generator Implant Date: 20210318
Lead Channel Impedance Value: 460 Ohm
Lead Channel Pacing Threshold Amplitude: 1.25 V
Lead Channel Pacing Threshold Pulse Width: 0.5 ms
Lead Channel Sensing Intrinsic Amplitude: 12 mV
Lead Channel Setting Pacing Amplitude: 2.5 V
Lead Channel Setting Pacing Pulse Width: 0.5 ms
Lead Channel Setting Sensing Sensitivity: 0.5 mV
Pulse Gen Serial Number: 111013899

## 2020-06-20 NOTE — Progress Notes (Signed)
Remote ICD transmission.   

## 2020-06-24 ENCOUNTER — Telehealth: Payer: Self-pay | Admitting: Internal Medicine

## 2020-06-24 NOTE — Telephone Encounter (Signed)
PA for entresto submitted via CMM - elixir (Key: [id number])

## 2020-06-24 NOTE — Telephone Encounter (Signed)
PA Case: [id number], Status: Approved, Coverage Starts on: 06/24/2020 12:00:00 AM, Coverage Ends on: 06/24/2021 12:00:00 AM.

## 2020-07-10 ENCOUNTER — Other Ambulatory Visit: Payer: Self-pay | Admitting: Cardiology

## 2020-07-12 ENCOUNTER — Ambulatory Visit (HOSPITAL_COMMUNITY)
Admission: RE | Admit: 2020-07-12 | Discharge: 2020-07-12 | Disposition: A | Discharge status: Home / Self Care | Payer: BC Managed Care – PPO | Source: Ambulatory Visit | Attending: Surgery | Admitting: Surgery

## 2020-07-12 ENCOUNTER — Encounter (HOSPITAL_COMMUNITY): Payer: BC Managed Care – PPO

## 2020-07-12 ENCOUNTER — Encounter: Payer: BC Managed Care – PPO | Admitting: *Deleted

## 2020-07-12 ENCOUNTER — Ambulatory Visit (HOSPITAL_BASED_OUTPATIENT_CLINIC_OR_DEPARTMENT_OTHER)
Admission: RE | Admit: 2020-07-12 | Discharge: 2020-07-12 | Disposition: A | Discharge status: Home / Self Care | Payer: BC Managed Care – PPO | Source: Ambulatory Visit | Attending: Surgery | Admitting: Surgery

## 2020-07-12 ENCOUNTER — Other Ambulatory Visit: Payer: Self-pay

## 2020-07-12 VITALS — BP 113/73 | HR 63 | Wt 215.0 lb

## 2020-07-12 DIAGNOSIS — I5043 Acute on chronic combined systolic (congestive) and diastolic (congestive) heart failure: Secondary | ICD-10-CM | POA: Insufficient documentation

## 2020-07-12 DIAGNOSIS — Z006 Encounter for examination for normal comparison and control in clinical research program: Secondary | ICD-10-CM

## 2020-07-12 DIAGNOSIS — I358 Other nonrheumatic aortic valve disorders: Secondary | ICD-10-CM | POA: Insufficient documentation

## 2020-07-12 LAB — ECHOCARDIOGRAM COMPLETE
Area-P 1/2: 4.49 cm2
Calc EF: 51.9 %
S' Lateral: 3.6 cm
Single Plane A2C EF: 55.3 %
Single Plane A4C EF: 49.9 %

## 2020-07-12 NOTE — Progress Notes (Signed)
Carotid duplex has been completed.   Preliminary results in CV Proc.   Blanch Media 07/12/2020 2:13 PM

## 2020-07-12 NOTE — Progress Notes (Signed)
  Echocardiogram 2D Echocardiogram has been performed.  Augustine Radar 07/12/2020, 2:16 PM

## 2020-07-12 NOTE — Research (Signed)
[facility] Informed Consent   Subject Name: Alan White  Subject met inclusion and exclusion criteria.  The informed consent form, study requirements and expectations were reviewed with the subject and questions and concerns were addressed prior to the signing of the consent form.  The subject verbalized understanding of the trial requirements.  The subject agreed to participate in the [facility] trial and signed the informed consent at 1242 on 12/OCT/2021.  The informed consent was obtained prior to performance of any protocol-specific procedures for the subject.  A copy of the signed informed consent was given to the subject and a copy was placed in the subject's medical record.   Alan White D   Section A:  Administrative Section  Subject ID: _[id number]_ - _012_ - 015 Subject Initials:  _W_ _-_ _L_  Date subject signed informed consent  _12_/_OCT_/_2021_ (DD /  MMM  / [place] Bre[place])  Section B:  Enrollment Criteria  Does the subject meet the FDA Indication for Use: Patients who remain symptomatic despite treatment with guideline-directed medical therapy, are NYHA Class III or Class II (who had a recent history of Class III), have a left ventricular ejection fraction ? 35%, a NT-proBNP < 1600 pg/ml and excluding patients indicated for Cardiac Resynchronization Therapy (CRT) according to AHA/ACC/ESC guidelines [x]  Yes    []  No (STOP - subject does not qualify for the study)  Has the subject been previously, or are currently, randomized in the CVRx BeAT-HF Trial? []  Yes (STOP - subject does not qualify for the study)   [x]  No  Has the subject received cardiac resynchronization therapy (CRT) within six months of enrollment, or is actively receiving CRT? []  Yes (STOP - subject does not qualify for the study)   [x]  No  Section B:  Screening/Baseline Assessments   Physical Assessment:            Date:    _12_/_OCT_/_2021_ (DD / MMM / [place] Breeding)  Weight: _215_  []  kg [x]  pounds Height: _182_  [x]  cm []  inches   Blood Pressure: _113_  / _73_mHg Heart Rate: _63_ bpm  Section C:  Labs   eGFR:  _83_ mL/min/1.47m Date:    _12_/_OCT_/_2021_  (DD / MMM / [place]  Negative Pregnancy Test? [x]  N/A Date:    _____/_______/_______                  (DD / MMM / [place]   []  Yes     []  No   Section D:  Appropriate Surgical/Study Candidate:   Is the subject able to discontinue the use of antiplatelet drugs (e.g. aspirin) in advance of the procedure, if required? [x]  Yes   []  No  Assessed by:   _Annamarie Major MD ___ Implanting Physician [x]  Yes Date:    _21_/_OCT_/_2021_ (DD / MMM / [place]   []  No   Section E:  Inclusion/Exclusion Criteria  Does the subject meet all Inclusion Criteria? [x]  Yes   []  No (STOP - subject does not qualify for the study)   Check all that apply   []  1. Age at least 21 years and no more than 80 years at the time of enrollment.   []  2. Appropriate candidate for the surgery as determined by an evaluation from the implanting physician using a carotid duplex ultrasound (CDU) [or Computed Tomography Angiography (CTA) if CDU inconclusive or incomplete] and a review of medical history (including existence of infections that may increase implant risk).  Evaluation must confirm the following within 30 days of  the BAROSTIM NEO implant: . Appropriate medical condition and medical history for implantation of the BAROSTIM NEO System AND . Anatomy that enables this implant procedure, with no vascular structures or orientations or neck anomalies that would be obstructive to the implantation path AND . The artery planned for the BAROSTIM NEO implant must have: o A carotid bifurcation below the level of the mandible AND o No ulcerative carotid arterial plaques AND o No carotid atherosclerosis producing a 30% or greater stenosis in linear diameter in the internal carotid AND o No carotid atherosclerosis producing a 30% or greater stenosis in linear diameter in the distal common carotid AND . Have had  no prior surgery, radiation, or endovascular stent placement in the carotid artery or the carotid sinus region AND . Able to discontinue the use of antiplatelet drugs (e.g. aspirin) in advance of the procedure, if required   []  3. Six-minute hall walk (6MHW) ? 150 m AND ? 400 m within 30 days prior to implant.   []  4. Serum estimated glomerular filtration rate (eGFR) ? 25 mL/min/1.73 m2 using the CKD-EPI method within 30 days prior to the [facility] NEO implant.   []  5. Body mass index ? 40 kg/m2 within 30 days prior to the [facility] NEO implant.   []  6. If male and of childbearing potential, must use a medically accepted method of birth control (e.g., barrier method with spermicide, oral contraceptive, or abstinence) and agree to continue use of this method for the duration of the study. Women of childbearing potential must have a negative pregnancy test within 14 days prior to the [facility] NEO implant.   []  7. Subjects implanted with a cardiac rhythm management device that does not utilize an intracardiac lead, or implanted with a neurostimulation device, must be approved by the [place].   []  8. Signed a CVRx-approved informed consent form for participation in this study.      Does the subject meet any Exclusion Criteria? []  Yes (STOP - subject does not qualify for the study)   [x]  No     List criteria # met: __________________________   []  1. Previously or currently randomized in the CVRx BeAT-HF Trial.   []  2. Received cardiac resynchronization therapy (CRT) within six months of enrollment or is actively receiving CRT.   []  3. Any of the following contraindications: . Baroreflex failure or autonomic neuropathy  . Uncontrolled, symptomatic cardiac bradyarrhythmias . Known allergy to silicone or titanium   []  4. Unstable ventricular arrhythmias.   []  5. Presence of baseline cranial nerve dysfunction determined by the Ear, Nose and Throat (ENT) examination.   []  6. Subjects with  any surgery that has occurred, or is planned to occur, within 45 days of the [facility] NEO implant.   []  7. Recent history (within 6 months of implant) of significant and uncontrolled bleeding.   []  8. Known and untreated hypercoagulability state.   []  9. An inappropriate study candidate as evidenced by: Marland Kitchen Solid organ or hematologic transplant, or currently being evaluated for an organ transplant. . Has received or is receiving LVAD therapy or chronic dialysis. . Current or planned treatment with intravenous positive inotrope therapy. . Primary pulmonary hypertension. . Severe COPD or severe restrictive lung disease (e.g. requires chronic oral steroid use or home oxygen use). .  Heart failure secondary to a reversible cause, such as cardiac structural valvular disease, acute myocarditis and pericardial constriction. . Clinically significant cardiac structural valvular disease. .  Unable or unwilling to fulfill  the protocol medication compliance and follow-up requirements, for reasons including but not limited to an unresolved history of alcohol or substance abuse or psychiatric disorder. . Active malignancy. .  Any other serious medical condition that may adversely affect the safety of the participant or validity of the study, in the opinion of the investigator. . Life expectancy less than one year.   []  10. Any of the following within 3 months prior to the [facility] NEO implant. . Myocardial infarction . Unstable angina . Coronary intervention (e.g. CABG or PTCA)  . Cerebral vascular accident or transient ischemic attack . Sudden cardiac death  . Surgical cardiac intervention (e.g. cardiac ablation, valve replacement)   []  11. Enrolled and active in another (e.g. device, pharmaceutical, or biological) clinical study unless approved by the [place].  Section F:  Adverse Events  Were there any Adverse Events that occurred since consent? []  Yes (complete AE form)   [x]  No   Section H:  Medication Changes   Have there been any changes to the subject's home use medications for Arrhythmia, Antiplatelet/Anticoagulation and Heart failure medications during screening and baseline? []  Yes (update Med form)   [x]  No  Section I:  Signature    Person completing form (Print Name): ___Kimberly Alba White :) _    Signature: ___Kimberly Alba White :) __ Date: _10/12/2021__    Six Minute Hall Walk Test Date:    _12_/_OCT_/_2021_  (DD / MMM / [place])  Distance Walked __396__ meters  Were there any devices used to assist the subject in walking (e.g. cane, walker, etc.)? [x]  No   []  Yes specify:_______________________  Was the walk terminated before 6 minutes? [x]  No   []  Yes specify reason: (select all that apply)    []  Angina    []   Dyspnea    []   Fatigue    []   Dizziness    []   Syncope    []   Other, specify:  Name of person conducting 6-Minute Hall Walk: ____Kimberly Alba White :) ___   Page 1 of 1     Confidential   Form Protocol [phone] Rev. B dated 08-Jul-2019   Effective Date: 20-Aug-2019   Patient ID: _[id number]_ - _012_ - 015 Patient Initials: _W_ _-_ _L_  Visit Interval:    [x]   Screening/Baseline    []  Activation   []  0.5 Month       []  1 Month     []  2 Month     []  3 Month       []  6 Month     []  12 Month         []  Unscheduled, reason for visit: _________________________________________  SECTION B:  WUJWJ-19 Like Illness Symptoms   Has the subject experienced any cold, flu or COVID-19 symptoms in the past 30 days? [x]   No  (skip to section C)     []  Yes, date of onset:  _____/_____ MMM/YYYY    If yes, check all symptoms that apply:   []  Fevers or chills   []   New or Worsening Cough  []  Productive  []  Dry     If yes to cough, indicate severity  []  Constant  []  Occasional, several per hour   []  New or worsened shortness of breath   []  Diarrhea   []  Altered or reduced sense of smell or taste   []  Muscle aches/Severe Fatigue   []  Chest pain or  tightness   []  Sore throat   []  Nausea or vomiting  SECTION C:  COVID-19 Like Illness Testing   Has the patient been tested for COVID-19? [x]  No     []  Yes, date of test:  _____/_____ MMM/YYYY    If yes, test results:   []  Positive []  Negative []  Unknown  Has the patient been tested for COVID-19 Antibodies? [x]  No     []  Yes, date of test:  _____/_____ MMM/YYYY    If yes, test results:   []  Positive []  Negative []  Unknown  Has the patient been vaccinated for COVID-19? []  No     [x]  Yes, date of test:  _01_/_APR_/_2021_ MMM/YYYY  Has the patient been tested for Influenza ("flu")? [x]  No     []  Yes, date of test:  ___/___ MMM/YYYY    If yes, test results:   []  Positive []  Negative []  Unknown  Has the patient been vaccinated for Influenza ("flu")? [x]  No     []  Yes, date of test:  _____/_____ MMM/YYYY  SECTION D:  COVID-19 Like Illness Exposure  Has the subject been told that they might have had COVID-19/have symptoms suggestive of COVID-19? [x]    No   []  Yes   []   Unknown  Has the subject been exposed to anyone with known or suspected COVID-19? [x]    No   []  Yes   []   Unknown  Has the subject been told that they might have had the "flu" or influenza? [x]    No   []  Yes   []   Unknown  SECTION E:  Effects of COVID Pandemic on [place] Interactions  Since the last study visit, did the subject feel the need to go to an emergency department or hospital for their heart failure but decided not to because of concerns about COVID-19? [x]   No   []  Yes   []  Unknown    If yes, how did they seek care (check all that apply):   []  Telemedicine visit []  In-person []  Clinic []  Urgent Care   []  Subject did not change hospital/ER use due to COVID-19 []  Other, specify: ________________________  Since the last study visit, did the subject have a cardiology/HF related appointment cancelled/rescheduled due to COVID-19 pandemic? [x]   No   []  Yes, how many? ___________   []  Unknown   Since the last study visit, did the subject have any cardiology/HF related telemedicine visit due to COVID-19 pandemic? [x]   No   []  Yes, how many? ___________   []  Unknown  Since the last study visit, did the subject have a cardiology/HF procedure cancelled/rescheduled due to COVID-19 pandemic? [x]   No   []  Yes, how many? ___________   []  Unknown  SECTION F:  Effects of COVID Pandemic on Subject's Medications  Since the last visit, did any of their heart failure medications change or stop, even for a short time?   If yes, enter change into medication eCRF [x]   No   []  Yes, how many? ___________   []  Unknown    If yes, why:   []  Instructed by Doctor []  Self-discontinued []  Unknown []  Other: ________________  Since the last visit, was the subject prescribed any medications for COVID-19? []   No   []  Yes   []  Unknown    If yes, what medications (generic name):  SECTION G:  Effects of COVID Pandemic on Subject's Lifestyle  How has the subject's activity/exercise level changed due to COVID-19 pandemic? [x]   No change   []  More activity/exercise   []  Less activity/exercise  How has  the subject's smoking habits changed due to COVID-19? []   Does not smoke   [x]  No change   []  Smoke more   []  Smoke less  How has the subject's alcohol drinking habits changed due to COVID-19? []   Does not drink   [x]  No change   []  Drink more   []  Drink less  Section H:  Signature    Person completing form (Print Name): Alan White :) ___    Signature: Alan White :) _________ Date: __12/Oct/2021_______     Patient ID: _[id number]_ - _012_ - 015 Patient Initials: _W_ _-_ _L_ Visit Interval:  [x]   Baseline     []  6 Month  Section D:  MLWHF   [place] Living With Heart Failure Questionnaire Date:    _12_/_OCT_/_2021_  (DD / MMM / YYYY)  These questions concern how your heart failure (heart condition) has prevented you from living as you wanted during the last month.  These items listed  below describe different ways some people are affected.  If you are sure an item does not apply to you or is not related to your heart failure then circle 0 (no) and go on to the next item.  If an item does apply to you, then circle the number rating how much it prevented you from living as you wanted.  Did your heart failure prevent you from living as you wanted during the last month by:   No Very little    Very much  1. Causing swelling in your ankles, legs, etc.? 0[x]  1[]  2[]  3[]  4[]  5[]   2. Making you sit or lie down to rest during the day? 0[]  1[]  2[]  3[]  4[]  5[x]   3. Making your walking about or climbing stairs difficult? 0[]  1[]  2[]  3[]  4[x]  5[]   4. Making your working around the house or yard difficult? 0[]  1[]  2[]  3[]  4[x]  5[]   5. Making your going places away from home difficult? 0[]  1[]  2[]  3[x]  4[]  5[]   6. Making your sleeping well at night difficult? 0[]  1[]  2[]  3[x]  4[]  5[]   7. Making your relating to or doing things with your friends or family difficult? 0[]  1[]  2[]   3[]  4[x]  5[]   8. Making your working to earn a living difficult? 0[x]  1[]  2[]  3[]  4[]  5[]   9. Making your recreational pastimes, sports or hobbies difficult? 0[]  1[]  2[]  3[]  4[]  5[x]   10. Making your sexual activities difficult? 0[]  1[]  2[]  3[]  4[x]  5[]   11. Making you eat less of the foods you like? 0[]  1[]  2[]  3[]  4[]  5[x]   12. Making you short of breath? 0[]  1[]  2[]  3[]  4[]  5[x]   13. Making you tired, fatigued, or low on energy? 0[]  1[]  2[]  3[]  4[]  5[x]   14. Making you stay in a hospital? 0[x]  1[]  2[]  3[]  4[]  5[]   15. Costing you money for medical care? 0[x]  1[]  2[]  3[]  4[]  5[]   16. Giving you side effects from medications? 0[x]  1[]  2[]  3[]  4[]  5[]   17. Making you feel you are a burden to your family or friends? 0[]  1[]  2[]  3[x]  4[]  5[]   18. Making you feel a loss of self-control in your life? 0[]  1[]  2[]  3[x]  4[]  5[]   19. Making you worry? 0[]  1[]  2[]  3[]  4[]  5[x]   20. Making it difficult for you to concentrate  or remember things? 0[]  1[]  2[]  3[x]  4[]  5[]   21 Making you feel depressed? 0[]  1[]  2[]  3[x]  4[]  5[]   Section E:  Signature Section  Person Administering Questionnaire Name: (  person who read first question and handed questionnaire to subject) __Kimberly Alba White :) __ (Print)    12/OCT/2021   Alan White :) __ (Sign) (Date)  Person Completing Questionnaire Name: (e.g. subject, person reading questionnaire, etc.) Alan White :) __ (Print) 12/OCT/2021   Alan White :) _ (Sign) (Date)   Subject ID: _[id number]_ - _012_ - 015 Subject Initials: _W_ _-_ _L_ Visit Interval:  [x]   Baseline     []  6 Month  Section B:  NYHA   NYHA Classification Date:    _12_/_OCT_/_2021______  (DD / MMM / [place])  Select One Class Subject Symptoms  []  I No limitations of physical activity, No undue fatigue, palpitation or dyspnea  []  II Slight limitation of physical activity, Comfortable at rest, Less than ordinary activity results in fatigue, Palpitation, or dyspnea  [x]  III Marked limitation of physical activity, Comfortable at rest, Less than ordinary activity results in fatigue, palpitation, or dyspnea  []  IV Unable to carry out any physical activity without discomfort, Symptoms of cardiac insufficiency at rest, Physical activity causes increased discomfort  Name of person conducting NYHA: ___Kimberly Alba White :) ____________   Section A:  Demographic Section  Subject ID: _[id number]_ - _012_ - 015 Subject Initials: _W_ -_ _L_  Date of Birth: _[birth year]_/_[birth year]_/_1962_      (DD / MMM / [place] Bre[place]) Gender: [x]  Male    []  Male  Ethnicity  []  Asian    [] Black/African Descent [] Middle Eastern        [x]  White/Caucasian     [] Other/Refused   Section B:  Medical History  9. BAROSTIM NEO FDA Indication for Use  NYHA Classification: Class: _3_ Date:       _13_ / _SEP_ / _2021_                DD       MMM      YYYY  LVEF: _30_ % Date:       _30_ / _DEC_ / _2021_                  DD      MMM       YYYY   NT-proBNP: _500_ pg/ml Date:       _13_ / _SEP_ / _2021_                 DD      MMM       YYYY  CRT Indication: []     Yes          [x]   No Assessment Date:  _13_ / _SEP_ / _2021_                                 DD       MMM      YYYY  10. Type of Cardiomyopathy []  Ischemic []  Non-ischemic [x]  Other, specify: _mixed ischemic and nonischemic cardiomyopathy__________    Yes No Unknown  11. Cardiovascular   (a) Resistant Hypertension (SBP?140 mmHg) []  [x]  []    (b) Heart Failure (HF) HF Diagnosis Date: _04_/_MAY_/__2020_ (MMM / YYYY)  HF Hospitalizations in last 12 Months _0____ []  []    (c) Left Ventricular Assist Device []  [x]  []    (d) CRT Specify: []  CRT-D   []  CRT-P Implant Date: ________/________ (MMM / [place]) [x]  []    (e) PA pressure device (e.g. CardioMEMS) Specify Device: ____________ Implant Date: ________/________ (MMM / [place]) [x]  []    (  f) Myocardial Infarction [x]  []  []    (g) CABG []  [x]  []    (h) Coronary Intervention [x]  []  []    (i) Aortic Valve Disease []  [x]  []    (j) Mitral Valve Disease []  [x]  []    (k) Left Ventricular Hypertrophy []  [x]  []   11. Cardiac Arrhythmia (a) Atrial fibrillation []  [x]  []    (b) Ventricular Tachycardia/Fibrillation []  [x]  []    (c) PVC []  [x]  []    (d) Pacemaker []  [x]  []    (e) ICD [x]  []  []    (f) Ablation Procedure Specify:[]  atrial []  ventricular Last procedure date:         ________/________ (MMM / [place]) [x]  []    (g) Cardioversion Procedure Specify: [] Shock [] Meds Last procedure date:         ________/________ (MMM / [place]) [x]  []    (h) Sudden Cardiac Death []  [x]  []     Yes No Unknown  4. Peripheral Vascular (a) Peripheral Vascular Disease []  [x]  []    (b) PVD Intervention (e.g. stent, angioplasty, etc.). []  [x]  []   5. Cerebrovascular / Neurological (a) Stroke []  [x]  []    (b) TIA []  [x]  []   6. Renal / Hepatic (a) Prior Renal Denervation Date of Denervation:        ________/________ (MMM / [place]) [x]  []    (b) Chronic Kidney Disease Stage  (1-5): ________   [x]  []    (c) Chronic Dialysis []  [x]  []    (d) Kidney Transplant []  [x]  []   7. Metabolic / Nutritional (a) Diabetes Mellitus  []   Type 1  []   Type 2 [x]  []    (b) Hypokalemia []  [x]  []   Section C:  Comments  Other medical history: dyslipidemia; GERD; sleep apnea         Section D:  Signature   Person completing form (Print Name): Alan White :) ______  Signature: Alan White :) _ Date: ___10/26/2021___

## 2020-07-13 LAB — BASIC METABOLIC PANEL
BUN/Creatinine Ratio: 13 (ref 10–24)
BUN: 13 mg/dL (ref 8–27)
CO2: 24 mmol/L (ref 20–29)
Calcium: 9.1 mg/dL (ref 8.6–10.2)
Chloride: 102 mmol/L (ref 96–106)
Creatinine, Ser: 0.98 mg/dL (ref 0.76–1.27)
GFR calc Af Amer: 96 mL/min/{1.73_m2} (ref >59)
GFR calc non Af Amer: 83 mL/min/{1.73_m2} (ref >59)
Glucose: 90 mg/dL (ref 65–99)
Potassium: 4.1 mmol/L (ref 3.5–5.2)
Sodium: 140 mmol/L (ref 134–144)

## 2020-07-13 NOTE — Progress Notes (Signed)
Screening for batwire #12

## 2020-07-14 ENCOUNTER — Other Ambulatory Visit: Payer: Self-pay | Admitting: *Deleted

## 2020-07-14 DIAGNOSIS — Z006 Encounter for examination for normal comparison and control in clinical research program: Secondary | ICD-10-CM

## 2020-07-14 NOTE — Progress Notes (Signed)
CTA neck order

## 2020-07-21 ENCOUNTER — Encounter: Payer: BC Managed Care – PPO | Admitting: *Deleted

## 2020-07-21 DIAGNOSIS — Z006 Encounter for examination for normal comparison and control in clinical research program: Secondary | ICD-10-CM

## 2020-07-21 NOTE — Research (Signed)
Patient here today for Dr Myra Gianotti to look at his carotids to give ok to move forward with CTA and ENT.

## 2020-07-25 ENCOUNTER — Other Ambulatory Visit: Payer: Self-pay

## 2020-07-25 ENCOUNTER — Encounter (HOSPITAL_COMMUNITY): Payer: Self-pay

## 2020-07-25 ENCOUNTER — Ambulatory Visit (HOSPITAL_COMMUNITY)
Admission: RE | Admit: 2020-07-25 | Discharge: 2020-07-25 | Disposition: A | Discharge status: Home / Self Care | Payer: BC Managed Care – PPO | Source: Ambulatory Visit | Attending: Surgery | Admitting: Surgery

## 2020-07-25 ENCOUNTER — Ambulatory Visit (INDEPENDENT_AMBULATORY_CARE_PROVIDER_SITE_OTHER): Payer: BC Managed Care – PPO | Admitting: Otolaryngology

## 2020-07-25 VITALS — Temp 96.4°F

## 2020-07-25 DIAGNOSIS — I6522 Occlusion and stenosis of left carotid artery: Secondary | ICD-10-CM | POA: Diagnosis not present

## 2020-07-25 DIAGNOSIS — Z006 Encounter for examination for normal comparison and control in clinical research program: Secondary | ICD-10-CM | POA: Insufficient documentation

## 2020-07-25 DIAGNOSIS — Z95828 Presence of other vascular implants and grafts: Secondary | ICD-10-CM | POA: Diagnosis not present

## 2020-07-25 MED ORDER — IOHEXOL 350 MG/ML SOLN
100.0000 mL | Freq: Once | INTRAVENOUS | Status: AC | PRN
  Administered 2020-07-25: 100 mL via INTRAVENOUS

## 2020-07-25 NOTE — Progress Notes (Signed)
[facility] #12 CTA

## 2020-07-25 NOTE — Progress Notes (Addendum)
Section A:  Administrative Section  Subject ID: _[id number]_ - _012____ - 015 Subject Initials: ___WKL ___ ___ Visit Interval:  (* = as required) [x]   Baseline     []  Implant/Pre D/C  Date of Report:   _25__/_OCT___/_2021______       (DD / MMM / YYYY)  []   1 Month     []  3 Month*  Name of ENT __Christopher MD_  []   6 Month*     []  12 Month*     []  Unscheduled*  Directions:  Baseline: complete sections B-E only   All other visits, complete all sections below (B-G)   Use comments box to document any abnormalities or changes from Baseline  Was the CVRx device turned off? []  Yes   []  No, specify reason why not: __________________________________  Was a laryngoscope used? [x]  Yes   []  No  Section B:  Dysphonia   Global Rating of Dysphonia: (Absence of recent upper respiratory infection or recent extensive voice abuse)   [x]   Normal (or clear)   []   Mild   []   Mild to Moderate   []   Moderate   []   Moderate to Severe   []   Severe   []   Aphonic  Section C:  Tongue/Pharynx   Assessment Observation Severity (complete only if abnormal); defined as:    Mild Significant impairment of functioning; patient is unable to carry out usual activities.    Moderate Patient experiences sufficient discomfort to interfere with or reduce their usual level of activity.    Severe Clinically evident impairment of functioning; patient is unable to carry out usual activities.  Pharynx at Rest [x]   Symmetric []   Mild   []  Asymmetric []  Moderate     []  Severe  Pharynx with Phonation ("ah") [x]   Symmetric []   Mild   []  Asymmetric []  Moderate     []  Severe  Tongue Atrophy [x]   Not Present []   Mild   []  Present []  Moderate     []  Severe  Tongue Mobility [x]   Normal []   Mild   []  Reduced []  Moderate     []  Severe  Tongue Protrusion [x]   Midline []   Mild Side: ____________   []  Deviation []  Moderate      []  Severe   Sensation of the Pharynx [x]   Normal []   Mild   []  Decreased []  Moderate     []  Severe   Section D:  House-Brackman Facial Paralysis Scale (Circle One)  Grade Impairment  I  [x]  Normal  II  []  Mild Dysfunction (slight weakness, normal symmetry at rest)  III  []  Moderate Dysfunction (obvious but not disfiguring weakness with synkinesis, normal symmetry at rest); Complete eye closure with maximal effort; Good forehead movement  IV  []  Moderately severe dysfunction (obvious and disfiguring asymmetry, significant synkinesis); Incomplete eye closure; moderate forehead movement  V  []  Severe dysfunction (barely perceptible motion  VI  []  Total paralysis (no movement)  Section E:  Baseline Cranial Nerve Damage  Does the subject have a presence of baseline cranial nerve dysfunction []  Yes, specify what dysfunction: _____________________________________   [x]  No  Section F:  ENT Notes (Pre-D/C and Follow-Up)  Were changes in Dysphonia from baseline or any abnormalities related to the [facility] Implant? []   Yes   []  No   []  Unknown   []  Not Applicable  Were changes in Tongue/Pharynx from baseline or any abnormalities related to the [facility] Implant? []   Yes   []   No   []  Unknown   []  Not Applicable  Were changes in House-Brackman Facial Paralysis Scale from baseline or any abnormalities related to the [facility] Implant? []   Yes   []  No   []  Unknown   []  Not Applicable  Were any of the above changes from baseline potentially related to the intubation for the procedure? []   Yes   []  No   []  Unknown   []  Not Applicable  Section G:  Cranial Nerves  Has there been new cranial nerve dysfunction since Baseline?   Marginal mandibular branch of cranial nerve VII (Facial Nerve) []  Yes, specify what changed: ___________________________________   []  No  Cranial nerve IX (Glossopharyngeal Nerve) []  Yes, specify what changed: ___________________________________   []  No  Cranial nerve X  (Vagus Nerve) []  Yes, specify what changed: ___________________________________   []  No  Cranial nerve XI   (Accessory Nerve) []  Yes, specify what changed: ___________________________________   []  No  Cranial nerve XII  (Hypoglossal Nerve) []  Yes, specify what changed: ___________________________________   []  No  Section H:  Comments/Notes/Describe any abnormalities/deficiencies (as well if post-operative change)

## 2020-08-01 ENCOUNTER — Other Ambulatory Visit (HOSPITAL_COMMUNITY)
Admission: RE | Admit: 2020-08-01 | Discharge: 2020-08-01 | Disposition: A | Discharge status: Home / Self Care | Payer: BC Managed Care – PPO | Source: Ambulatory Visit | Attending: Surgery | Admitting: Surgery

## 2020-08-01 ENCOUNTER — Other Ambulatory Visit: Payer: Self-pay

## 2020-08-01 ENCOUNTER — Encounter (HOSPITAL_COMMUNITY): Payer: Self-pay | Admitting: Surgery

## 2020-08-01 ENCOUNTER — Other Ambulatory Visit (HOSPITAL_COMMUNITY): Payer: BC Managed Care – PPO

## 2020-08-01 LAB — SARS CORONAVIRUS 2 (TAT 6-24 HRS): SARS Coronavirus 2: NEGATIVE

## 2020-08-01 MED ORDER — SODIUM CHLORIDE 0.9 % IV SOLN
80.0000 mg | INTRAVENOUS | Status: AC
  Administered 2020-08-02: 80 mg
  Filled 2020-08-01 (×2): qty 2

## 2020-08-01 NOTE — Progress Notes (Signed)
Called patient at [phone] for PAT information.  PCP - Dr Izola Price Cardiologist - Dr Rennis Golden  Chest x-ray - 12/17/19 (2V) EKG - 08/02/20 DOS Stress Test - 09/12/16 ECHO - 07/12/20 Cardiac Cath - 01/31/19  ICD - Yes, [employer] Gallant VR ICD.  [phone] spoke with Rep. Dahlia Client and [facility] for Rep. Kerry Fort [phone] to inform them of patient's  surgery date/time. Last remote check was on 06/16/20. IB message sent to CV Div. For Perioperative Prescription for programming.  Arlys John in cath lab [phone] to inform of patient's ICD.    Sleep Study -  Yes CPAP - Patient denies this dx as of 08/01/20, does not use CPAP.  Blood Thinner Instructions:  Plavix last dose was on 07/28/20  Aspirin Instructions:  ASA last dose was on 07/28/20  Anesthesia review: Yes  STOP now taking any Aspirin (unless otherwise instructed by your surgeon), Aleve, Naproxen, Ibuprofen, Motrin, Advil, Goody's, BC's, all herbal medications, fish oil, and all vitamins.   Coronavirus Screening Covid 08/01/20 results are negative.  Patient verbalized understanding of instructions that were given via phone.

## 2020-08-01 NOTE — H&P (Signed)
Vascular and Vein Specialist of [facility]  Patient name: Alan White MRN: [id number] DOB: [birth year] Sex: male   REQUESTING PROVIDER:    Dr. Rennis Golden   REASON FOR CONSULT:    Batwire implant  HISTORY OF PRESENT ILLNESS:   Alan White is a 59 y.o. male, who is referred for [facility] Barostim Neo implant.  He has a history of coronary artery disease and is status post two-vessel PCI to the left circumflex and LAD in 2015.  He takes a statin for hypercholesterolemia.  He suffers from nonischemic cardiomyopathy with an ejection fraction of 25 to 30%.  He had a AICD placed in March 2021.  He continues to complain of fatigue.  He is referred for device implant.  He complains of generalized fatigue.  PAST MEDICAL HISTORY    Past Medical History:  Diagnosis Date  . AICD (automatic cardioverter/defibrillator) present    [employer]) Gallant ICD  . CAD (coronary artery disease), 12/29/13 PCI/DES LCX and PCI/DES LAD with overlapping DES  12/30/2013   cath 07/05/14 [place]  . CHF (congestive heart failure) (HCC)   . Dyslipidemia, goal LDL below 70 12/30/2013  . GERD (gastroesophageal reflux disease)   . Metabolic syndrome, HgbA1C 6.0  12/30/2013  . Myocardial infarction (HCC) 2015,2020  . Sleep apnea    by history - patient denies this dx as of 08/01/20  . Smoker   . Wears partial dentures    upper     FAMILY HISTORY   Family History  Problem Relation Age of Onset  . Heart attack Brother        Deceased  . Heart attack Brother   . Stroke Sister   . Diabetes Father        also heart disease  . Cancer Father        Deceased  . Diabetes Mother   . Cancer Mother        Deceased    SOCIAL HISTORY:   Social History   Socioeconomic History  . Marital status: Married    Spouse name:    . Number of children: Not on file  . Years of education: Not on file  . Highest education level: Not on file  Occupational History  . Occupation: Clinical cytogeneticist: [employer]  Tobacco Use  . Smoking status: Current Every Day Smoker    Packs/day: 0.50    Years: 40.00    Pack years: 20.00    Types: Cigarettes  . Smokeless tobacco: Never Used  Vaping Use  . Vaping Use: Never used  Substance and Sexual Activity  . Alcohol use: Yes    Comment: "Once in a blue moon"  . Drug use: No  . Sexual activity: Not on file  Other Topics Concern  . Not on file  Social History Narrative   Pt lives with wife.   Social Determinants of Health   Financial Resource Strain:   . Difficulty of Paying Living Expenses: Not on file  Food Insecurity:   . Worried About Programme researcher, broadcasting/film/video in the Last Year: Not on file  . Ran Out of Food in the Last Year: Not on file  Transportation Needs:   . Lack of Transportation (Medical): Not on file  . Lack of Transportation (Non-Medical): Not on file  Physical Activity:   . Days of Exercise per Week: Not on file  . Minutes of Exercise per Session: Not on file  Stress:   . Feeling  of Stress : Not on file  Social Connections:   . Frequency of Communication with Friends and Family: Not on file  . Frequency of Social Gatherings with Friends and Family: Not on file  . Attends Religious Services: Not on file  . Active Member of Clubs or Organizations: Not on file  . Attends Banker Meetings: Not on file  . Marital Status: Not on file  Intimate Partner Violence:   . Fear of Current or Ex-Partner: Not on file  . Emotionally Abused: Not on file  . Physically Abused: Not on file  . Sexually Abused: Not on file    ALLERGIES:    No Known Allergies  CURRENT MEDICATIONS:    Current Facility-Administered Medications  Medication Dose Route Frequency Provider Last Rate Last Admin  . [START ON 08/02/2020] gentamicin (GARAMYCIN) 80 mg in sodium chloride 0.9 % 500 mL irrigation  80 mg Irrigation On Call Nada Libman, MD       Current Outpatient Medications  Medication Sig Dispense Refill  .  aspirin EC 81 MG EC tablet Take 1 tablet (81 mg total) by mouth daily. 30 tablet 11  . atorvastatin (LIPITOR) 80 MG tablet TAKE 1 TABLET BY MOUTH EVERY DAY (Patient taking differently: Take 80 mg by mouth daily. ) 90 tablet 1  . carvedilol (COREG) 3.125 MG tablet TAKE 1 TABLET BY MOUTH TWICE A DAY WITH A MEAL (Patient taking differently: Take 3.125 mg by mouth in the morning and at bedtime. ) 180 tablet 3  . clopidogrel (PLAVIX) 75 MG tablet TAKE 1 TABLET BY MOUTH EVERY DAY (Patient taking differently: Take 75 mg by mouth daily. ) 90 tablet 2  . ENTRESTO 24-26 MG TAKE 1 TABLET BY MOUTH TWICE A DAY (Patient taking differently: Take 1 tablet by mouth in the morning and at bedtime. ) 60 tablet 11  . nitroGLYCERIN (NITRODUR - DOSED IN MG/24 HR) 0.2 mg/hr patch PLACE 1 PATCH (0.2 MG TOTAL) ONTO THE SKIN DAILY. 90 patch 1  . nitroGLYCERIN (NITROSTAT) 0.4 MG SL tablet Place 1 tablet (0.4 mg total) under the tongue every 5 (five) minutes x 3 doses as needed for chest pain. 25 tablet 12  . pantoprazole (PROTONIX) 40 MG tablet TAKE 1 TABLET BY MOUTH EVERY DAY (Patient taking differently: Take 40 mg by mouth daily before breakfast. ) 90 tablet 2  . spironolactone (ALDACTONE) 25 MG tablet TAKE 1/2 TABLET BY MOUTH EVERY DAY (Patient taking differently: Take 12.5 mg by mouth every other day. In the morning) 45 tablet 4    REVIEW OF SYSTEMS:   [X]  denotes positive finding, [ ]  denotes negative finding Cardiac  Comments:  Chest pain or chest pressure:    Shortness of breath upon exertion:    Short of breath when lying flat:    Irregular heart rhythm:        Vascular    Pain in calf, thigh, or hip brought on by ambulation:    Pain in feet at night that wakes you up from your sleep:     Blood clot in your veins:    Leg swelling:         Pulmonary    Oxygen at home:    Productive cough:     Wheezing:         Neurologic    Sudden weakness in arms or legs:     Sudden numbness in arms or legs:     Sudden  onset of difficulty speaking or slurred  speech:    Temporary loss of vision in one eye:     Problems with dizziness:         Gastrointestinal    Blood in stool:      Vomited blood:         Genitourinary    Burning when urinating:     Blood in urine:        Psychiatric    Major depression:         Hematologic    Bleeding problems:    Problems with blood clotting too easily:        Skin    Rashes or ulcers:        Constitutional    Fever or chills:     PHYSICAL EXAM:   Vitals:   08/01/20 1828  Weight: 99.3 kg  Height: 6' (1.829 m)    GENERAL: The patient is a well-nourished male, in no acute distress. The vital signs are documented above. CARDIAC: There is a regular rate and rhythm.  VASCULAR: I evaluate his carotid anatomy with ultrasound.  His bifurcation is at the hyoid.  There is no significant calcification or stenosis. PULMONARY: Nonlabored respirations ABDOMEN: Soft and non-tender with normal pitched bowel sounds.  MUSCULOSKELETAL: There are no major deformities or cyanosis. NEUROLOGIC: No focal weakness or paresthesias are detected. SKIN: There are no ulcers or rashes noted. PSYCHIATRIC: The patient has a normal affect.  STUDIES:   I have reviewed his CT scan of the neck which shows no significant stenosis.  Carotid duplex: Right Carotid: The extracranial vessels were near-normal with only minimal  wall         thickening or plaque.   Left Carotid: The extracranial vessels were near-normal with only minimal  wall        thickening or plaque.   Vertebrals: Bilateral vertebral arteries demonstrate antegrade flow.   Echo: 1. Technically difficult; akinesis of the distal anteroseptal and apical  walls; overall moderate to severe LV dysfunction; grade 2 diastolic  dysfunction.  2. Left ventricular ejection fraction, by estimation, is 30 to 35%. The  left ventricle has moderate to severely decreased function. The left  ventricle  demonstrates regional wall motion abnormalities (see scoring  diagram/findings for description). Left  ventricular diastolic parameters are consistent with Grade II diastolic  dysfunction (pseudonormalization).  3. Right ventricular systolic function is normal. The right ventricular  size is normal. Tricuspid regurgitation signal is inadequate for assessing  PA pressure.  4. The mitral valve is normal in structure. Trivial mitral valve  regurgitation. No evidence of mitral stenosis.  5. The aortic valve is tricuspid. Aortic valve regurgitation is not  visualized. Mild aortic valve sclerosis is present, with no evidence of  aortic valve stenosis.  6. The inferior vena cava is normal in size with greater than 50%  respiratory variability, suggesting right atrial pressure of 3 mmHg.   ASSESSMENT and PLAN   Stage III congestive heart failure: I discussed with the patient that he would be a candidate for Batwire technique for Barostim Neo device implant.  I discussed the details of the procedure including the technique.  We discussed the possibility of conversion to an open procedure.  All of his questions were answered.  We will hold his Plavix prior to the procedure.  All questions were answered.   Charlena Cross, MD, FACS [facility] [phone] Pager [phone]

## 2020-08-02 ENCOUNTER — Ambulatory Visit (HOSPITAL_COMMUNITY): Payer: BC Managed Care – PPO | Admitting: Anesthesiology

## 2020-08-02 ENCOUNTER — Inpatient Hospital Stay (HOSPITAL_COMMUNITY): Payer: BC Managed Care – PPO

## 2020-08-02 ENCOUNTER — Encounter (HOSPITAL_COMMUNITY): Payer: Self-pay | Admitting: Surgery

## 2020-08-02 ENCOUNTER — Other Ambulatory Visit: Payer: Self-pay

## 2020-08-02 ENCOUNTER — Encounter (HOSPITAL_COMMUNITY)
Admission: AD | Disposition: A | Discharge status: Home / Self Care | Payer: Self-pay | Source: Home / Self Care | Attending: Surgery

## 2020-08-02 ENCOUNTER — Inpatient Hospital Stay (HOSPITAL_COMMUNITY)
Admission: AD | Admit: 2020-08-02 | Discharge: 2020-08-03 | DRG: 253 | Disposition: A | Discharge status: Home / Self Care | Payer: BC Managed Care – PPO | Attending: Surgery | Admitting: Surgery

## 2020-08-02 DIAGNOSIS — I428 Other cardiomyopathies: Secondary | ICD-10-CM | POA: Diagnosis present

## 2020-08-02 DIAGNOSIS — I5022 Chronic systolic (congestive) heart failure: Secondary | ICD-10-CM

## 2020-08-02 DIAGNOSIS — Z7982 Long term (current) use of aspirin: Secondary | ICD-10-CM

## 2020-08-02 DIAGNOSIS — I5042 Chronic combined systolic (congestive) and diastolic (congestive) heart failure: Principal | ICD-10-CM | POA: Diagnosis present

## 2020-08-02 DIAGNOSIS — E785 Hyperlipidemia, unspecified: Secondary | ICD-10-CM | POA: Diagnosis present

## 2020-08-02 DIAGNOSIS — I252 Old myocardial infarction: Secondary | ICD-10-CM

## 2020-08-02 DIAGNOSIS — Z006 Encounter for examination for normal comparison and control in clinical research program: Secondary | ICD-10-CM

## 2020-08-02 DIAGNOSIS — I2510 Atherosclerotic heart disease of native coronary artery without angina pectoris: Secondary | ICD-10-CM | POA: Diagnosis present

## 2020-08-02 DIAGNOSIS — I255 Ischemic cardiomyopathy: Secondary | ICD-10-CM

## 2020-08-02 DIAGNOSIS — K219 Gastro-esophageal reflux disease without esophagitis: Secondary | ICD-10-CM | POA: Diagnosis present

## 2020-08-02 DIAGNOSIS — F419 Anxiety disorder, unspecified: Secondary | ICD-10-CM | POA: Diagnosis present

## 2020-08-02 DIAGNOSIS — F17210 Nicotine dependence, cigarettes, uncomplicated: Secondary | ICD-10-CM | POA: Diagnosis present

## 2020-08-02 DIAGNOSIS — Z7902 Long term (current) use of antithrombotics/antiplatelets: Secondary | ICD-10-CM

## 2020-08-02 DIAGNOSIS — Z955 Presence of coronary angioplasty implant and graft: Secondary | ICD-10-CM

## 2020-08-02 DIAGNOSIS — Z79899 Other long term (current) drug therapy: Secondary | ICD-10-CM

## 2020-08-02 DIAGNOSIS — Z8249 Family history of ischemic heart disease and other diseases of the circulatory system: Secondary | ICD-10-CM

## 2020-08-02 DIAGNOSIS — G4730 Sleep apnea, unspecified: Secondary | ICD-10-CM | POA: Diagnosis present

## 2020-08-02 DIAGNOSIS — Z95810 Presence of automatic (implantable) cardiac defibrillator: Secondary | ICD-10-CM

## 2020-08-02 HISTORY — PX: BAROREFLEX SYSTEM INSERTION: EP1254

## 2020-08-02 HISTORY — DX: Heart failure, unspecified: I50.9

## 2020-08-02 HISTORY — DX: Presence of dental prosthetic device (complete) (partial): Z97.2

## 2020-08-02 HISTORY — DX: Acute myocardial infarction, unspecified: I21.9

## 2020-08-02 HISTORY — DX: Presence of automatic (implantable) cardiac defibrillator: Z95.810

## 2020-08-02 LAB — TYPE AND SCREEN
ABO/RH(D): O POS
Antibody Screen: NEGATIVE

## 2020-08-02 LAB — BASIC METABOLIC PANEL
Anion gap: 8 (ref 5–15)
BUN: 18 mg/dL (ref 6–20)
CO2: 29 mmol/L (ref 22–32)
Calcium: 9.4 mg/dL (ref 8.9–10.3)
Chloride: 102 mmol/L (ref 98–111)
Creatinine, Ser: 1.29 mg/dL — ABNORMAL HIGH (ref 0.61–1.24)
GFR, Estimated: >60 mL/min (ref >60)
Glucose, Bld: 111 mg/dL — ABNORMAL HIGH (ref 70–99)
Potassium: 4.2 mmol/L (ref 3.5–5.1)
Sodium: 139 mmol/L (ref 135–145)

## 2020-08-02 LAB — POCT I-STAT, CHEM 8
BUN: 18 mg/dL (ref 6–20)
Calcium, Ion: 1.23 mmol/L (ref 1.15–1.40)
Chloride: 102 mmol/L (ref 98–111)
Creatinine, Ser: 1.1 mg/dL (ref 0.61–1.24)
Glucose, Bld: 109 mg/dL — ABNORMAL HIGH (ref 70–99)
HCT: 41 % (ref 39.0–52.0)
Hemoglobin: 13.9 g/dL (ref 13.0–17.0)
Potassium: 3.9 mmol/L (ref 3.5–5.1)
Sodium: 141 mmol/L (ref 135–145)
TCO2: 24 mmol/L (ref 22–32)

## 2020-08-02 LAB — CBC
HCT: 44.1 % (ref 39.0–52.0)
Hemoglobin: 14.2 g/dL (ref 13.0–17.0)
MCH: 30.2 pg (ref 26.0–34.0)
MCHC: 32.2 g/dL (ref 30.0–36.0)
MCV: 93.8 fL (ref 80.0–100.0)
Platelets: 222 10*3/uL (ref 150–400)
RBC: 4.7 MIL/uL (ref 4.22–5.81)
RDW: 14.3 % (ref 11.5–15.5)
WBC: 9.6 10*3/uL (ref 4.0–10.5)
nRBC: 0 % (ref 0.0–0.2)

## 2020-08-02 LAB — ABO/RH: ABO/RH(D): O POS

## 2020-08-02 LAB — GLUCOSE, CAPILLARY
Glucose-Capillary: 130 mg/dL — ABNORMAL HIGH (ref 70–99)
Glucose-Capillary: 181 mg/dL — ABNORMAL HIGH (ref 70–99)
Glucose-Capillary: 193 mg/dL — ABNORMAL HIGH (ref 70–99)

## 2020-08-02 SURGERY — BAROREFLEX SYSTEM INSERTION
Anesthesia: General | Laterality: Right

## 2020-08-02 MED ORDER — OXYCODONE HCL 5 MG PO TABS: 5.0000 mg | ORAL_TABLET | ORAL | Status: DC | PRN

## 2020-08-02 MED ORDER — DOCUSATE SODIUM 100 MG PO CAPS: 100.0000 mg | ORAL_CAPSULE | Freq: Every day | ORAL | Status: DC

## 2020-08-02 MED ORDER — ETOMIDATE 2 MG/ML IV SOLN
INTRAVENOUS | Status: DC | PRN
  Administered 2020-08-02: 10 mg via INTRAVENOUS

## 2020-08-02 MED ORDER — CHLORHEXIDINE GLUCONATE 0.12 % MT SOLN
OROMUCOSAL | Status: AC
  Administered 2020-08-02: 15 mL
  Filled 2020-08-02: qty 15

## 2020-08-02 MED ORDER — SODIUM CHLORIDE 0.9 % IV SOLN
INTRAVENOUS | Status: AC
  Filled 2020-08-02: qty 2

## 2020-08-02 MED ORDER — SODIUM CHLORIDE 0.9 % IV SOLN: 500.0000 mL | Freq: Once | INTRAVENOUS | Status: DC | PRN

## 2020-08-02 MED ORDER — SODIUM CHLORIDE 0.9 % IV SOLN: INTRAVENOUS | Status: DC

## 2020-08-02 MED ORDER — METOPROLOL TARTRATE 5 MG/5ML IV SOLN: 2.0000 mg | INTRAVENOUS | Status: DC | PRN

## 2020-08-02 MED ORDER — NITROGLYCERIN 0.4 MG SL SUBL: 0.4000 mg | SUBLINGUAL_TABLET | SUBLINGUAL | Status: DC | PRN

## 2020-08-02 MED ORDER — GUAIFENESIN-DM 100-10 MG/5ML PO SYRP: 15.0000 mL | ORAL_SOLUTION | ORAL | Status: DC | PRN

## 2020-08-02 MED ORDER — ACETAMINOPHEN 500 MG PO TABS
1000.0000 mg | ORAL_TABLET | Freq: Once | ORAL | Status: AC
  Administered 2020-08-02: 1000 mg via ORAL
  Filled 2020-08-02 (×2): qty 2

## 2020-08-02 MED ORDER — PANTOPRAZOLE SODIUM 40 MG PO TBEC: 40.0000 mg | DELAYED_RELEASE_TABLET | Freq: Every day | ORAL | Status: DC

## 2020-08-02 MED ORDER — EPHEDRINE SULFATE-NACL 50-0.9 MG/10ML-% IV SOSY
PREFILLED_SYRINGE | INTRAVENOUS | Status: DC | PRN
  Administered 2020-08-02 (×5): 10 mg via INTRAVENOUS

## 2020-08-02 MED ORDER — SODIUM CHLORIDE 0.9 % IV SOLN
0.0125 ug/kg/min | INTRAVENOUS | Status: AC
  Administered 2020-08-02: .1 ug/kg/min via INTRAVENOUS
  Filled 2020-08-02 (×2): qty 2000

## 2020-08-02 MED ORDER — LACTATED RINGERS IV SOLN: INTRAVENOUS | Status: DC | PRN

## 2020-08-02 MED ORDER — SPIRONOLACTONE 12.5 MG HALF TABLET
12.5000 mg | ORAL_TABLET | ORAL | Status: DC
  Administered 2020-08-03: 12.5 mg via ORAL
  Filled 2020-08-02: qty 1

## 2020-08-02 MED ORDER — ONDANSETRON HCL 4 MG/2ML IJ SOLN
INTRAMUSCULAR | Status: DC | PRN
  Administered 2020-08-02: 4 mg via INTRAVENOUS

## 2020-08-02 MED ORDER — ATORVASTATIN CALCIUM 80 MG PO TABS: 80.0000 mg | ORAL_TABLET | Freq: Every day | ORAL | Status: DC

## 2020-08-02 MED ORDER — MIDAZOLAM HCL 5 MG/5ML IJ SOLN
INTRAMUSCULAR | Status: DC | PRN
  Administered 2020-08-02 (×2): 2 mg via INTRAVENOUS

## 2020-08-02 MED ORDER — ROCURONIUM BROMIDE 10 MG/ML (PF) SYRINGE
PREFILLED_SYRINGE | INTRAVENOUS | Status: DC | PRN
  Administered 2020-08-02: 60 mg via INTRAVENOUS
  Administered 2020-08-02: 3 mg via INTRAVENOUS

## 2020-08-02 MED ORDER — GENTAMICIN IN SALINE 1.6-0.9 MG/ML-% IV SOLN
INTRAVENOUS | Status: AC
  Filled 2020-08-02: qty 50

## 2020-08-02 MED ORDER — LABETALOL HCL 5 MG/ML IV SOLN: 10.0000 mg | INTRAVENOUS | Status: DC | PRN

## 2020-08-02 MED ORDER — CEFAZOLIN SODIUM-DEXTROSE 2-4 GM/100ML-% IV SOLN
2.0000 g | Freq: Three times a day (TID) | INTRAVENOUS | Status: AC
  Administered 2020-08-02 (×2): 2 g via INTRAVENOUS
  Filled 2020-08-02 (×2): qty 100

## 2020-08-02 MED ORDER — CARVEDILOL 3.125 MG PO TABS
3.1250 mg | ORAL_TABLET | Freq: Two times a day (BID) | ORAL | Status: DC
  Administered 2020-08-03: 3.125 mg via ORAL
  Filled 2020-08-02 (×2): qty 1

## 2020-08-02 MED ORDER — LACTATED RINGERS IV SOLN: INTRAVENOUS | Status: DC

## 2020-08-02 MED ORDER — HYDRALAZINE HCL 20 MG/ML IJ SOLN: 5.0000 mg | INTRAMUSCULAR | Status: DC | PRN

## 2020-08-02 MED ORDER — ALUM & MAG HYDROXIDE-SIMETH 200-200-20 MG/5ML PO SUSP: 15.0000 mL | ORAL | Status: DC | PRN

## 2020-08-02 MED ORDER — PHENOL 1.4 % MT LIQD: 1.0000 | OROMUCOSAL | Status: DC | PRN

## 2020-08-02 MED ORDER — MORPHINE SULFATE (PF) 2 MG/ML IV SOLN: 2.0000 mg | INTRAVENOUS | Status: DC | PRN

## 2020-08-02 MED ORDER — MAGNESIUM SULFATE 2 GM/50ML IV SOLN: 2.0000 g | Freq: Every day | INTRAVENOUS | Status: DC | PRN

## 2020-08-02 MED ORDER — HEPARIN (PORCINE) IN NACL 1000-0.9 UT/500ML-% IV SOLN
INTRAVENOUS | Status: DC | PRN
  Administered 2020-08-02: 500 mL

## 2020-08-02 MED ORDER — FENTANYL CITRATE (PF) 100 MCG/2ML IJ SOLN
INTRAMUSCULAR | Status: DC | PRN
Start: 2020-08-02 — End: 2020-08-02
  Administered 2020-08-02: 100 ug via INTRAVENOUS

## 2020-08-02 MED ORDER — DEXAMETHASONE SODIUM PHOSPHATE 10 MG/ML IJ SOLN
INTRAMUSCULAR | Status: DC | PRN
  Administered 2020-08-02: 10 mg via INTRAVENOUS

## 2020-08-02 MED ORDER — CHLORHEXIDINE GLUCONATE 4 % EX LIQD
4.0000 "application " | Freq: Once | CUTANEOUS | Status: DC
  Filled 2020-08-02: qty 60

## 2020-08-02 MED ORDER — CEFAZOLIN SODIUM-DEXTROSE 2-4 GM/100ML-% IV SOLN
2.0000 g | INTRAVENOUS | Status: AC
  Administered 2020-08-02: 2 g via INTRAVENOUS
  Filled 2020-08-02: qty 100

## 2020-08-02 MED ORDER — SACUBITRIL-VALSARTAN 24-26 MG PO TABS
1.0000 | ORAL_TABLET | Freq: Two times a day (BID) | ORAL | Status: DC
  Administered 2020-08-02 – 2020-08-03 (×2): 1 via ORAL
  Filled 2020-08-02 (×2): qty 1

## 2020-08-02 MED ORDER — ACETAMINOPHEN 650 MG RE SUPP: 325.0000 mg | RECTAL | Status: DC | PRN

## 2020-08-02 MED ORDER — ASPIRIN EC 325 MG PO TBEC
325.0000 mg | DELAYED_RELEASE_TABLET | Freq: Every day | ORAL | Status: DC
  Administered 2020-08-03: 325 mg via ORAL
  Filled 2020-08-02: qty 1

## 2020-08-02 MED ORDER — SUGAMMADEX SODIUM 200 MG/2ML IV SOLN
INTRAVENOUS | Status: DC | PRN
  Administered 2020-08-02 (×3): 100 mg via INTRAVENOUS

## 2020-08-02 MED ORDER — ACETAMINOPHEN 325 MG PO TABS: 325.0000 mg | ORAL_TABLET | ORAL | Status: DC | PRN

## 2020-08-02 MED ORDER — CARVEDILOL 3.125 MG PO TABS: 3.1250 mg | ORAL_TABLET | ORAL | Status: DC

## 2020-08-02 MED ORDER — NITROGLYCERIN 0.2 MG/HR TD PT24: 0.2000 mg | MEDICATED_PATCH | Freq: Every day | TRANSDERMAL | Status: DC

## 2020-08-02 MED ORDER — REMIFENTANIL BOLUS VIA INFUSION OPTIME
INTRAVENOUS | Status: DC | PRN
Start: 2020-08-02 — End: 2020-08-02
  Administered 2020-08-02: 49.7 ug via INTRAVENOUS

## 2020-08-02 MED ORDER — ONDANSETRON HCL 4 MG/2ML IJ SOLN: 4.0000 mg | Freq: Four times a day (QID) | INTRAMUSCULAR | Status: DC | PRN

## 2020-08-02 MED ORDER — PHENYLEPHRINE 40 MCG/ML (10ML) SYRINGE FOR IV PUSH (FOR BLOOD PRESSURE SUPPORT)
PREFILLED_SYRINGE | INTRAVENOUS | Status: DC | PRN
  Administered 2020-08-02 (×8): 80 ug via INTRAVENOUS

## 2020-08-02 MED ORDER — POTASSIUM CHLORIDE CRYS ER 20 MEQ PO TBCR: 20.0000 meq | EXTENDED_RELEASE_TABLET | Freq: Every day | ORAL | Status: DC | PRN

## 2020-08-02 SURGICAL SUPPLY — 8 items
BLANKET WARM UNDERBOD FULL ACC (MISCELLANEOUS) ×3 IMPLANT
CABLE SURGICAL S-101-97-12 (CABLE) ×3 IMPLANT
GENERATOR IPG BAROSTIM 2102 (Generator) ×3 IMPLANT
LEAD CAROTID BAROSTIM 1036 (Lead) ×3 IMPLANT
NEEDLE PERC 18GX4CM (NEEDLE) ×6 IMPLANT
PAD PRO RADIOLUCENT 2001M-C (PAD) ×3 IMPLANT
SHEATH PROBE COVER 6X72 (BAG) ×3 IMPLANT
TRAY PACEMAKER INSERTION (PACKS) ×3 IMPLANT

## 2020-08-02 NOTE — Progress Notes (Signed)
Mobility Specialist - Progress Note   08/02/20 1443  Mobility  Activity Ambulated in hall  Level of Assistance Standby assist, set-up cues, supervision of patient - no hands on  Assistive Device None  Distance Ambulated (ft) 500 ft  Mobility Response Tolerated well  Mobility performed by Mobility specialist  $Mobility charge 1 Mobility    Pre-mobility, 2L O2: 84 HR, 114/72 BP, 99% SpO2 During mobility, 1 L O2: 108 HR, 97% SpO2 Post-mobility, RA: 80 HR, 113/78 BP, 96% SpO2  Pt asx throughout ambulation. He ambulated on 1 L O2 and was left on RA once he was back in bed as his SpO2 remained >96%. RN notified.   Mamie Levers Mobility Specialist Mobility Specialist Phone: [phone]

## 2020-08-02 NOTE — Progress Notes (Signed)
Three very  small incisions rt neck w/dermabond dressing,c,d,i.

## 2020-08-02 NOTE — Progress Notes (Signed)
Rt radial arterial line d/c'ed, manual pressure held x 10 minutes, palpable rt radial, level 0, dressed w/gauze and tegaderm.

## 2020-08-02 NOTE — Anesthesia Procedure Notes (Signed)
Arterial Line Insertion Start/End11/11/2019 7:15 AM, 08/02/2020 7:18 AM Performed by: CRNA  Patient location: Pre-op. Preanesthetic checklist: patient identified, IV checked, site marked, risks and benefits discussed, surgical consent, monitors and equipment checked, pre-op evaluation, timeout performed and anesthesia consent Right, radial was placed Catheter size: 20 G Hand hygiene performed , maximum sterile barriers used  and Seldinger technique used Allen's test indicative of satisfactory collateral circulation Attempts: 2 Procedure performed without using ultrasound guided technique. Following insertion, dressing applied and Biopatch. Post procedure assessment: normal  Patient tolerated the procedure well with no immediate complications.

## 2020-08-02 NOTE — Progress Notes (Signed)
Called [employer] regarding ICD.  Alan White is the rep on call.  Left detailed message.  Awaiting return call.

## 2020-08-02 NOTE — Progress Notes (Signed)
S/p Batwire / Barostim Neo implant Up walking in hallway, tolerated PO earlier Incisions without hematoma  Neck soft Anticipate d/c in am   [name]

## 2020-08-02 NOTE — Interval H&P Note (Signed)
History and Physical Interval Note:  08/02/2020 7:26 AM  Alan White  has presented today for surgery, with the diagnosis of heart failure.  The various methods of treatment have been discussed with the patient and family. After consideration of risks, benefits and other options for treatment, the patient has consented to  Procedure(s): BAROREFLEX SYSTEM INSERTION (Right) as a surgical intervention.  The patient's history has been reviewed, patient examined, no change in status, stable for surgery.  I have reviewed the patient's chart and labs.  Questions were answered to the patient's satisfaction.    Risks, benefits, and alternatives to the procedure were discussed in detail today.  The patient understands that risks include but are not limited to bleeding, infection, neuro vascular damage, nerve paralysis, stroke, MI, death,  and lead dislodgement and wishes to proceed.    Hillis Range

## 2020-08-02 NOTE — Anesthesia Postprocedure Evaluation (Signed)
Anesthesia Post Note  Patient: Alan White  Procedure(s) Performed: BAROREFLEX SYSTEM INSERTION (Right )     Patient location during evaluation: Cath Lab Anesthesia Type: General Level of consciousness: awake Pain management: pain level controlled Vital Signs Assessment: post-procedure vital signs reviewed and stable Respiratory status: spontaneous breathing, nonlabored ventilation, respiratory function stable and patient connected to nasal cannula oxygen Cardiovascular status: blood pressure returned to baseline and stable Postop Assessment: no apparent nausea or vomiting Anesthetic complications: no   No complications documented.  Last Vitals:  Vitals:   08/02/20 1700 08/02/20 1701  BP: 97/67 112/78  Pulse: 85 93  Resp: 17 (!) 21  Temp:    SpO2: 96% 96%    Last Pain:  Vitals:   08/02/20 1600  TempSrc: Oral  PainSc:                  [name] [name]

## 2020-08-02 NOTE — Progress Notes (Signed)
Smile symmetrical, tongue midline

## 2020-08-02 NOTE — Progress Notes (Signed)
Spoke to [employer], rep for [employer].  Will be here for surgery.

## 2020-08-02 NOTE — Anesthesia Preprocedure Evaluation (Addendum)
Anesthesia Evaluation  Patient identified by MRN, date of birth, ID band Patient awake    Reviewed: Allergy & Precautions, NPO status , Patient's Chart, lab work & pertinent test results  Airway Mallampati: III  TM Distance: >3 FB Neck ROM: Full    Dental  (+) Edentulous Upper, Missing   Pulmonary Current Smoker and Patient abstained from smoking.,    Pulmonary exam normal breath sounds clear to auscultation       Cardiovascular + angina + CAD, + Past MI, + Cardiac Stents and +CHF  Normal cardiovascular exam+ Cardiac Defibrillator  Rhythm:Regular Rate:Normal  ECHO:  1. Technically difficult; akinesis of the distal anteroseptal and apical walls; overall moderate to severe LV dysfunction; grade 2 diastolic dysfunction.  2. Left ventricular ejection fraction, by estimation, is 30 to 35%. The left ventricle has moderate to severely decreased function. The left ventricle demonstrates regional wall motion abnormalities (see scoring  diagram/findings for description). Left  ventricular diastolic parameters are consistent with Grade II diastolic dysfunction (pseudonormalization).  3. Right ventricular systolic function is normal. The right ventricular size is normal. Tricuspid regurgitation signal is inadequate for assessing PA pressure.  4. The mitral valve is normal in structure. Trivial mitral valve regurgitation. No evidence of mitral stenosis.  5. The aortic valve is tricuspid. Aortic valve regurgitation is not visualized. Mild aortic valve sclerosis is present, with no evidence of aortic valve stenosis.  6. The inferior vena cava is normal in size with greater than 50% respiratory variability, suggesting right atrial pressure of 3 mmHg.   Neuro/Psych Anxiety negative neurological ROS     GI/Hepatic Neg liver ROS, GERD  Medicated and Controlled,  Endo/Other  negative endocrine ROS  Renal/GU negative Renal ROS      Musculoskeletal negative musculoskeletal ROS (+)   Abdominal   Peds  Hematology HLD   Anesthesia Other Findings heart failure  Reproductive/Obstetrics                            Anesthesia Physical Anesthesia Plan  ASA: III  Anesthesia Plan: General   Post-op Pain Management:    Induction: Intravenous  PONV Risk Score and Plan: 1 and Ondansetron, Dexamethasone, Midazolam and Treatment may vary due to age or medical condition  Airway Management Planned: Oral ETT  Additional Equipment: Arterial line  Intra-op Plan:   Post-operative Plan: Extubation in OR  Informed Consent: I have reviewed the patients History and Physical, chart, labs and discussed the procedure including the risks, benefits and alternatives for the proposed anesthesia with the patient or authorized representative who has indicated his/her understanding and acceptance.     Dental advisory given  Plan Discussed with: CRNA  Anesthesia Plan Comments:        Anesthesia Quick Evaluation

## 2020-08-02 NOTE — Progress Notes (Addendum)
Section A:  Administrative Section  Subject ID: _[id number]_ - _012____ - 015 Subject Initials: ___WKL ___ ___ Visit Interval:  (* = as required) []   Baseline     [x]  Implant/Pre D/C  Date of Report:   __02___/___nov____/__2021_____       (DD / MMM / YYYY)  []   1 Month     []  3 Month*  Name of ENT __Christopher MD_  []   6 Month*     []  12 Month*     []  Unscheduled*  Directions:  Baseline: complete sections B-E only   All other visits, complete all sections below (B-G)   Use comments box to document any abnormalities or changes from Baseline  Was the CVRx device turned off? [x]  Yes   []  No, specify reason why not: __________________________________  Was a laryngoscope used? [x]  Yes   []  No  Section B:  Dysphonia   Global Rating of Dysphonia: (Absence of recent upper respiratory infection or recent extensive voice abuse)   [x]   Normal (or clear)   []   Mild   []   Mild to Moderate   []   Moderate   []   Moderate to Severe   []   Severe   []   Aphonic  Section C:  Tongue/Pharynx   Assessment Observation Severity (complete only if abnormal); defined as:    Mild Significant impairment of functioning; patient is unable to carry out usual activities.    Moderate Patient experiences sufficient discomfort to interfere with or reduce their usual level of activity.    Severe Clinically evident impairment of functioning; patient is unable to carry out usual activities.  Pharynx at Rest [x]   Symmetric []   Mild   []  Asymmetric []  Moderate     []  Severe  Pharynx with Phonation ("ah") [x]   Symmetric []   Mild   []  Asymmetric []  Moderate     []  Severe  Tongue Atrophy [x]   Not Present []   Mild   []  Present []  Moderate     []  Severe  Tongue Mobility [x]   Normal []   Mild   []  Reduced []  Moderate     []  Severe  Tongue Protrusion [x]   Midline []   Mild Side: ____________   []  Deviation []  Moderate      []  Severe   Sensation of the Pharynx [x]   Normal []   Mild   []  Decreased []  Moderate     []   Severe  Section D:  House-Brackman Facial Paralysis Scale (Circle One)  Grade Impairment  I  [x]  Normal  II  []  Mild Dysfunction (slight weakness, normal symmetry at rest)  III  []  Moderate Dysfunction (obvious but not disfiguring weakness with synkinesis, normal symmetry at rest); Complete eye closure with maximal effort; Good forehead movement  IV  []  Moderately severe dysfunction (obvious and disfiguring asymmetry, significant synkinesis); Incomplete eye closure; moderate forehead movement  V  []  Severe dysfunction (barely perceptible motion  VI  []  Total paralysis (no movement)  Section E:  Baseline Cranial Nerve Damage  Does the subject have a presence of baseline cranial nerve dysfunction []  Yes, specify what dysfunction: _____________________________________   [x]  No  Section F:  ENT Notes (Pre-D/C and Follow-Up)  Were changes in Dysphonia from baseline or any abnormalities related to the [facility] Implant? []   Yes   [x]  No   []  Unknown   []  Not Applicable  Were changes in Tongue/Pharynx from baseline or any abnormalities related to the [facility] Implant? []   Yes   [x]   No   []  Unknown   []  Not Applicable  Were changes in House-Brackman Facial Paralysis Scale from baseline or any abnormalities related to the [facility] Implant? []   Yes   [x]  No   []  Unknown   []  Not Applicable  Were any of the above changes from baseline potentially related to the intubation for the procedure? []   Yes   [x]  No   []  Unknown   []  Not Applicable  Section G:  Cranial Nerves  Has there been new cranial nerve dysfunction since Baseline?   Marginal mandibular branch of cranial nerve VII (Facial Nerve) []  Yes, specify what changed: ___________________________________   [x]  No  Cranial nerve IX (Glossopharyngeal Nerve) []  Yes, specify what changed: ___________________________________   [x]  No  Cranial nerve X  (Vagus Nerve) []  Yes, specify what changed: ___________________________________   [x]  No   Cranial nerve XI  (Accessory Nerve) []  Yes, specify what changed: ___________________________________   [x]  No  Cranial nerve XII  (Hypoglossal Nerve) []  Yes, specify what changed: ___________________________________   Mi[x]  No  Section H:  Comments/Notes/Describe any abnormalities/deficiencies (as well if post-operative change)    Mild swelling of the neck noted but no neurological changes noted.

## 2020-08-02 NOTE — Interval H&P Note (Signed)
History and Physical Interval Note:  08/02/2020 7:25 AM  Alan White  has presented today for surgery, with the diagnosis of heart failure.  The various methods of treatment have been discussed with the patient and family. After consideration of risks, benefits and other options for treatment, the patient has consented to  Procedure(s): BAROREFLEX SYSTEM INSERTION (Right) as a surgical intervention.  The patient's history has been reviewed, patient examined, no change in status, stable for surgery.  I have reviewed the patient's chart and labs.  Questions were answered to the patient's satisfaction.     Durene Cal

## 2020-08-02 NOTE — Anesthesia Procedure Notes (Signed)
Procedure Name: Intubation Date/Time: 08/02/2020 7:51 AM Performed by: Adria Dill, CRNA Pre-anesthesia Checklist: Patient identified, Emergency Drugs available, Suction available and Patient being monitored Patient Re-evaluated:Patient Re-evaluated prior to induction Oxygen Delivery Method: Circle system utilized Preoxygenation: Pre-oxygenation with 100% oxygen Induction Type: IV induction Ventilation: Mask ventilation without difficulty and Oral airway inserted - appropriate to patient size Laryngoscope Size: Miller and 3 Grade View: Grade I Tube type: Oral Tube size: 7.5 mm Number of attempts: 1 Airway Equipment and Method: Stylet and Oral airway Placement Confirmation: ETT inserted through vocal cords under direct vision,  positive ETCO2 and breath sounds checked- equal and bilateral Secured at: 21 cm Tube secured with: Tape Dental Injury: Teeth and Oropharynx as per pre-operative assessment

## 2020-08-02 NOTE — Transfer of Care (Signed)
Immediate Anesthesia Transfer of Care Note  Patient: Prathik Aman  Procedure(s) Performed: BAROREFLEX SYSTEM INSERTION (Right )  Patient Location: PACU and Cath Lab  Anesthesia Type:General  Level of Consciousness: awake, alert , oriented and patient cooperative  Airway & Oxygen Therapy: Patient Spontanous Breathing and Patient connected to nasal cannula oxygen  Post-op Assessment: Report given to RN and Post -op Vital signs reviewed and stable  Post vital signs: Reviewed and stable  Last Vitals:  Vitals Value Taken Time  BP 116/76 08/02/20 1012  Temp    Pulse 72 08/02/20 1013  Resp 15 08/02/20 1013  SpO2 94 % 08/02/20 1013  Vitals shown include unvalidated device data.  Last Pain:  Vitals:   08/02/20 1002  TempSrc: Temporal  PainSc: 0-No pain      Patients Stated Pain Goal: 4 (08/01/20 1828)  Complications: No complications documented.

## 2020-08-03 LAB — CBC
HCT: 39.3 % (ref 39.0–52.0)
Hemoglobin: 13.1 g/dL (ref 13.0–17.0)
MCH: 30.8 pg (ref 26.0–34.0)
MCHC: 33.3 g/dL (ref 30.0–36.0)
MCV: 92.3 fL (ref 80.0–100.0)
Platelets: 212 10*3/uL (ref 150–400)
RBC: 4.26 MIL/uL (ref 4.22–5.81)
RDW: 14.2 % (ref 11.5–15.5)
WBC: 15.7 10*3/uL — ABNORMAL HIGH (ref 4.0–10.5)
nRBC: 0 % (ref 0.0–0.2)

## 2020-08-03 LAB — BASIC METABOLIC PANEL
Anion gap: 10 (ref 5–15)
BUN: 20 mg/dL (ref 6–20)
CO2: 23 mmol/L (ref 22–32)
Calcium: 8.8 mg/dL — ABNORMAL LOW (ref 8.9–10.3)
Chloride: 103 mmol/L (ref 98–111)
Creatinine, Ser: 1.23 mg/dL (ref 0.61–1.24)
GFR, Estimated: >60 mL/min (ref >60)
Glucose, Bld: 173 mg/dL — ABNORMAL HIGH (ref 70–99)
Potassium: 3.9 mmol/L (ref 3.5–5.1)
Sodium: 136 mmol/L (ref 135–145)

## 2020-08-03 MED ORDER — HYDROCODONE-ACETAMINOPHEN 5-325 MG PO TABS: 1.0000 | ORAL_TABLET | ORAL | 0 refills | Status: AC | PRN

## 2020-08-03 NOTE — Progress Notes (Addendum)
  Progress Note    08/03/2020 7:12 AM 1 Day Post-Op  Subjective: No complaints this morning.  He has been out of bed and ambulated in the hallways without dizziness or lightheadedness.  No shortness of breath or chest pain.  Tolerating regular diet.  Voiding spontaneously.  Arterial line discontinued at approximate 11 AM yesterday morning.   Vitals:   08/02/20 2350 08/03/20 0355  BP: 99/66 105/67  Pulse: 74 73  Resp: 16 15  Temp: 98.2 F (36.8 C) 97.8 F (36.6 C)  SpO2: 97% 95%    Physical Exam: Cardiac: Rate and rhythm are regular Lungs: Clear to auscultation bilaterally Incisions: Neck incisions well approximated.  Right anterior chest incision with clean dry dressing intact Extremities: Moves all well. Neuro: Alert and oriented x4.  Tongue midline.  Face symmetric.  CBC    Component Value Date/Time   WBC 15.7 (H) 08/03/2020 0039   RBC 4.26 08/03/2020 0039   HGB 13.1 08/03/2020 0039   HGB 13.9 05/19/2019 0908   HCT 39.3 08/03/2020 0039   HCT 40.4 05/19/2019 0908   PLT 212 08/03/2020 0039   PLT 262 05/19/2019 0908   MCV 92.3 08/03/2020 0039   MCV 92 05/19/2019 0908   MCH 30.8 08/03/2020 0039   MCHC 33.3 08/03/2020 0039   RDW 14.2 08/03/2020 0039   RDW 12.7 05/19/2019 0908   LYMPHSABS 2.9 01/31/2019 2237   MONOABS 1.1 (H) 01/31/2019 2237   EOSABS 0.1 01/31/2019 2237   BASOSABS 0.1 01/31/2019 2237    BMET    Component Value Date/Time   NA 136 08/03/2020 0039   NA 140 07/12/2020 1300   K 3.9 08/03/2020 0039   CL 103 08/03/2020 0039   CO2 23 08/03/2020 0039   GLUCOSE 173 (H) 08/03/2020 0039   BUN 20 08/03/2020 0039   BUN 13 07/12/2020 1300   CREATININE 1.23 08/03/2020 0039   CALCIUM 8.8 (L) 08/03/2020 0039   GFRNONAA >60 08/03/2020 0039   GFRAA 96 07/12/2020 1300     Intake/Output Summary (Last 24 hours) at 08/03/2020 3888 Last data filed at 08/03/2020 0600 Gross per 24 hour  Intake 1800 ml  Output 600 ml  Net 1200 ml    HOSPITAL  MEDICATIONS Scheduled Meds: . aspirin EC  325 mg Oral Daily  . atorvastatin  80 mg Oral Daily  . carvedilol  3.125 mg Oral BID WC  . docusate sodium  100 mg Oral Daily  . pantoprazole  40 mg Oral Daily  . sacubitril-valsartan  1 tablet Oral BID  . spironolactone  12.5 mg Oral QODAY   Continuous Infusions: . sodium chloride 50 mL/hr at 08/02/20 1019  . sodium chloride    . magnesium sulfate bolus IVPB     PRN Meds:.sodium chloride, acetaminophen **OR** acetaminophen, alum & mag hydroxide-simeth, guaiFENesin-dextromethorphan, hydrALAZINE, labetalol, magnesium sulfate bolus IVPB, metoprolol tartrate, morphine injection, nitroGLYCERIN, ondansetron, oxyCODONE, phenol, potassium chloride  Assessment: POD 1 baroreflex system insertion.  Blood pressure a bit soft yesterday evening.  Labs within normal limits.  Plan: -Discharge home. Follow-up in research clininc   Wendi Maya, PA-C [facility] [phone] 08/03/2020  7:12 AM

## 2020-08-03 NOTE — Discharge Instructions (Signed)
Incision Care, Adult An incision is a surgical cut that is made through your skin. Most incisions are closed after surgery. Your incision may be closed with stitches (sutures), staples, skin glue, or adhesive strips. You may need to return to your health care provider to have sutures or staples removed. This may occur several days to several weeks after your surgery. The incision needs to be cared for properly to prevent infection. How to care for your incision Incision care   Follow instructions from your health care provider about how to take care of your incision. Make sure you: ? Wash your hands with soap and water before you change the bandage (dressing). If soap and water are not available, use hand sanitizer. ? Change your dressing as told by your health care provider. ? Leave sutures, skin glue, or adhesive strips in place. These skin closures may need to stay in place for 2 weeks or longer. If adhesive strip edges start to loosen and curl up, you may trim the loose edges. Do not remove adhesive strips completely unless your health care provider tells you to do that.  Check your incision area every day for signs of infection. Check for: ? More redness, swelling, or pain. ? More fluid or blood. ? Warmth. ? Pus or a bad smell.  Ask your health care provider how to clean the incision. This may include: ? Using mild soap and water. ? Using a clean towel to pat the incision dry after cleaning it. ? Applying a cream or ointment. Do this only as told by your health care provider. ? Covering the incision with a clean dressing.  Ask your health care provider when you can leave the incision uncovered.  Do not take baths, swim, or use a hot tub until your health care provider approves. Ask your health care provider if you can take showers. You may only be allowed to take sponge baths for bathing. Medicines  If you were prescribed an antibiotic medicine, cream, or ointment, take or apply the  antibiotic as told by your health care provider. Do not stop taking or applying the antibiotic even if your condition improves.  Take over-the-counter and prescription medicines only as told by your health care provider. General instructions  Limit movement around your incision to improve healing. ? Avoid straining, lifting, or exercise for the first month, or for as long as told by your health care provider. ? Follow instructions from your health care provider about returning to your normal activities. ? Ask your health care provider what activities are safe.  Protect your incision from the sun when you are outside for the first 6 months, or for as long as told by your health care provider. Apply sunscreen around the scar or cover it up.  Keep all follow-up visits as told by your health care provider. This is important. Contact a health care provider if:  Your have more redness, swelling, or pain around the incision.  You have more fluid or blood coming from the incision.  Your incision feels warm to the touch.  You have pus or a bad smell coming from the incision.  You have a fever or shaking chills.  You are nauseous or you vomit.  You are dizzy.  Your sutures or staples come undone. Get help right away if:  You have a red streak coming from your incision.  Your incision bleeds through the dressing and the bleeding does not stop with gentle pressure.  The edges of   your incision open up and separate.  You have severe pain.  You have a rash.  You are confused.  You faint.  You have trouble breathing and a fast heartbeat. This information is not intended to replace advice given to you by your health care provider. Make sure you discuss any questions you have with your health care provider. Document Revised: 09/19/2018 Document Reviewed: 04/04/2016 Elsevier Patient Education  2020 [employer]   

## 2020-08-03 NOTE — Research (Signed)
Discharge instruction given from research and temporary card given to patient for him to keep in his wallet. Will see patient back in 2 week for follow up and wound check.      Patient ID:_[id number]_ - _012_ - 015 Patient Initials: _W_ _K_ _L_  Visit Interval:    []   Screening/Baseline    [x]  Activation   []  0.5 Month       []  1 Month     []  2 Month     []  3 Month       []  6 Month     []  12 Month         []  Unscheduled, reason for visit: _________________________________________  SECTION B:  COVID-19 Like Illness Symptoms   Has the subject experienced any cold, flu or COVID-19 symptoms in the past 30 days? [x]   No  (skip to section C)     []  Yes, date of onset:  _____/_____ MMM/YYYY    If yes, check all symptoms that apply:   []  Fevers or chills   []   New or Worsening Cough  []  Productive  []  Dry     If yes to cough, indicate severity  []  Constant  []  Occasional, several per hour   []  New or worsened shortness of breath   []  Diarrhea   []  Altered or reduced sense of smell or taste   []  Muscle aches/Severe Fatigue   []  Chest pain or tightness   []  Sore throat   []  Nausea or vomiting  SECTION C:  COVID-19 Like Illness Testing   Has the patient been tested for COVID-19? []  No     [x]  Yes, date of test:   _01_/ _NOV_/_2021_ MMM/YYYY    If yes, test results:   []  Positive [x]  Negative []  Unknown  Has the patient been tested for COVID-19 Antibodies? [x]  No     []  Yes, date of test:  _____/_____ MMM/YYYY    If yes, test results:   []  Positive []  Negative []  Unknown  Has the patient been vaccinated for COVID-19? [x]  No     []  Yes, date of test:  _____/_____ MMM/YYYY  Has the patient been tested for Influenza ("flu")? [x]  No     []  Yes, date of test:  _____/_____ MMM/YYYY    If yes, test results:   []  Positive []  Negative []  Unknown  Has the patient been vaccinated for Influenza ("flu")? [x]  No     []  Yes, date of test:  _____/_____ MMM/YYYY  SECTION D:  COVID-19 Like Illness  Exposure  Has the subject been told that they might have had COVID-19/have symptoms suggestive of COVID-19? [x]    No   []  Yes   []   Unknown  Has the subject been exposed to anyone with known or suspected COVID-19? [x]    No   []  Yes   []   Unknown  Has the subject been told that they might have had the "flu" or influenza? [x]    No   []  Yes   []   Unknown  SECTION E:  Effects of COVID Pandemic on Subject's Healthcare Interactions  Since the last study visit, did the subject feel the need to go to an emergency department or hospital for their heart failure but decided not to because of concerns about COVID-19? [x]   No   []  Yes   []  Unknown    If yes, how did they seek care (check all that apply):   []  Telemedicine visit []  In-person []   Clinic []  Urgent Care   []  Subject did not change hospital/ER use due to COVID-19 []  Other, specify: ________________________  Since the last study visit, did the subject have a cardiology/HF related appointment cancelled/rescheduled due to COVID-19 pandemic? [x]   No   []  Yes, how many? ___________   []  Unknown  Since the last study visit, did the subject have any cardiology/HF related telemedicine visit due to COVID-19 pandemic? [x]   No   []  Yes, how many? ___________   []  Unknown  Since the last study visit, did the subject have a cardiology/HF procedure cancelled/rescheduled due to COVID-19 pandemic? [x]   No   []  Yes, how many? ___________   []  Unknown  SECTION F:  Effects of COVID Pandemic on Subject's Medications  Since the last visit, did any of their heart failure medications change or stop, even for a short time?   If yes, enter change into medication eCRF [x]   No   []  Yes, how many? ___________   []  Unknown    If yes, why:   []  Instructed by Doctor []  Self-discontinued []  Unknown []  Other: ________________  Since the last visit, was the subject prescribed any medications for COVID-19? [x]   No   []  Yes   []  Unknown    If yes, what medications  (generic name):  SECTION G:  Effects of COVID Pandemic on Subject's Lifestyle  How has the subject's activity/exercise level changed due to COVID-19 pandemic? [x]   No change   []  More activity/exercise   []  Less activity/exercise  How has the subject's smoking habits changed due to COVID-19? []   Does not smoke   [x]  No change   []  Smoke more   []  Smoke less  How has the subject's alcohol drinking habits changed due to COVID-19? [x]   Does not drink   []  No change   []  Drink more   []  Drink less  Section H:  Signature    Person completing form (Print Name): __Kimberly :) _______________    Signature: :) _________ Date: ___03/Nov/2021____    No current facility-administered medications for this encounter.  Current Outpatient Medications:  .  aspirin EC 81 MG EC tablet, Take 1 tablet (81 mg total) by mouth daily., Disp: 30 tablet, Rfl: 11 .  atorvastatin (LIPITOR) 80 MG tablet, TAKE 1 TABLET BY MOUTH EVERY DAY (Patient taking differently: Take 80 mg by mouth daily. ), Disp: 90 tablet, Rfl: 1 .  carvedilol (COREG) 3.125 MG tablet, TAKE 1 TABLET BY MOUTH TWICE A DAY WITH A MEAL (Patient taking differently: Take 3.125 mg by mouth in the morning and at bedtime. ), Disp: 180 tablet, Rfl: 3 .  clopidogrel (PLAVIX) 75 MG tablet, TAKE 1 TABLET BY MOUTH EVERY DAY (Patient taking differently: Take 75 mg by mouth daily. ), Disp: 90 tablet, Rfl: 2 .  ENTRESTO 24-26 MG, TAKE 1 TABLET BY MOUTH TWICE A DAY (Patient taking differently: Take 1 tablet by mouth in the morning and at bedtime. ), Disp: 60 tablet, Rfl: 11 .  pantoprazole (PROTONIX) 40 MG tablet, TAKE 1 TABLET BY MOUTH EVERY DAY (Patient taking differently: Take 40 mg by mouth daily before breakfast. ), Disp: 90 tablet, Rfl: 2 .  spironolactone (ALDACTONE) 25 MG tablet, TAKE 1/2 TABLET BY MOUTH EVERY DAY (Patient taking differently: Take 12.5 mg by mouth every other day. In the morning), Disp: 45 tablet, Rfl: 4 .   HYDROcodone-acetaminophen (NORCO/VICODIN) 5-325 MG tablet, Take 1 tablet by mouth every 4 (four) hours as  needed for moderate pain., Disp: 6 tablet, Rfl: 0 .  nitroGLYCERIN (NITRODUR - DOSED IN MG/24 HR) 0.2 mg/hr patch, PLACE 1 PATCH (0.2 MG TOTAL) ONTO THE SKIN DAILY., Disp: 90 patch, Rfl: 1 .  nitroGLYCERIN (NITROSTAT) 0.4 MG SL tablet, Place 1 tablet (0.4 mg total) under the tongue every 5 (five) minutes x 3 doses as needed for chest pain., Disp: 25 tablet, Rfl: 12

## 2020-08-03 NOTE — Discharge Summary (Signed)
Discharge Summary     Alan White 04/17/1961 59 y.o. male  [id number]  Admission Date: 08/02/2020  Discharge Date: 08/03/2020 Physician: Nada Libman, MD  Admission Diagnosis: CHF (congestive heart failure), NYHA class III, chronic, combined (HCC) [I50.42]   HPI:   This is a 59 y.o. male with history of hear failure  Hospital Course:  The patient was admitted to the hospital and taken to the operating room on 08/02/2020 and underwent right Batwire / Barostim Neo implant   Findings: device implanted on right carotid artery  The pt tolerated the procedure well and was transported to the PACU in excellent condition.   By POD 1, the pt neuro status intact. He was ambulating in the hallway without light-headedness or SOB. No chest pain. Incisions well approximated. VSS. Afebrile. Voiding spontaneously.  The remainder of the hospital course consisted of increasing mobilization and increasing intake of solids without difficulty.   Recent Labs    08/02/20 0639 08/02/20 0639 08/02/20 0730 08/03/20 0039  NA 139   < > 141 136  K 4.2   < > 3.9 3.9  CL 102   < > 102 103  CO2 29  --   --  23  GLUCOSE 111*   < > 109* 173*  BUN 18   < > 18 20  CALCIUM 9.4  --   --  8.8*   < > = values in this interval not displayed.   Recent Labs    08/02/20 0625 08/02/20 0625 08/02/20 0730 08/03/20 0039  WBC 9.6  --   --  15.7*  HGB 14.2   < > 13.9 13.1  HCT 44.1   < > 41.0 39.3  PLT 222  --   --  212   < > = values in this interval not displayed.   No results for input(s): INR in the last 72 hours.   Discharge Instructions    Discharge patient   Complete by: As directed    Discharge disposition: 01-Home or Self Care   Discharge patient date: 08/03/2020      Discharge Diagnosis:  CHF (congestive heart failure), NYHA class III, chronic, combined (HCC) [I50.42]  Secondary Diagnosis: Patient Active Problem List   Diagnosis Date Noted  . CHF (congestive heart  failure), NYHA class III, chronic, combined (HCC) 08/02/2020  . Ischemic cardiomyopathy 02/02/2019  . ACS (acute coronary syndrome) (HCC) 01/31/2019  . Acute ST elevation myocardial infarction (STEMI) involving left anterior descending coronary artery (HCC) 01/31/2019  . Acute ST elevation myocardial infarction (STEMI) involving left anterior descending (LAD) coronary artery (HCC)   . Anxiety 03/04/2015  . Sleep apnea by history 07/06/2014  . Chest pain of uncertain etiology 07/03/2014  . Family history of coronary artery disease 07/03/2014  . CAD- S/P PCI 12/30/2013  . Abnormal EKG 12/30/2013  . Hyperlipidemia 12/30/2013  . Metabolic syndrome, HgbA1C 6.0  12/30/2013  . Tobacco use 12/28/2013   Past Medical History:  Diagnosis Date  . AICD (automatic cardioverter/defibrillator) present    [employer]) [employer] ICD  . CAD (coronary artery disease), 12/29/13 PCI/DES LCX and PCI/DES LAD with overlapping DES  12/30/2013   cath 07/05/14 OK  . CHF (congestive heart failure) (HCC)   . Dyslipidemia, goal LDL below 70 12/30/2013  . GERD (gastroesophageal reflux disease)   . Metabolic syndrome, HgbA1C 6.0  12/30/2013  . Myocardial infarction (HCC) 2015,2020  . Sleep apnea    by history - patient denies this dx as of 08/01/20  .  Smoker   . Wears partial dentures    upper    Allergies as of 08/03/2020   No Known Allergies     Medication List    TAKE these medications   aspirin 81 MG EC tablet Take 1 tablet (81 mg total) by mouth daily.   atorvastatin 80 MG tablet Commonly known as: LIPITOR TAKE 1 TABLET BY MOUTH EVERY DAY   carvedilol 3.125 MG tablet Commonly known as: COREG TAKE 1 TABLET BY MOUTH TWICE A DAY WITH A MEAL What changed: See the new instructions.   clopidogrel 75 MG tablet Commonly known as: PLAVIX TAKE 1 TABLET BY MOUTH EVERY DAY   Entresto 24-26 MG Generic drug: sacubitril-valsartan TAKE 1 TABLET BY MOUTH TWICE A DAY What changed: when to take this    HYDROcodone-acetaminophen 5-325 MG tablet Commonly known as: NORCO/VICODIN Take 1 tablet by mouth every 4 (four) hours as needed for moderate pain.   nitroGLYCERIN 0.4 MG SL tablet Commonly known as: NITROSTAT Place 1 tablet (0.4 mg total) under the tongue every 5 (five) minutes x 3 doses as needed for chest pain.   nitroGLYCERIN 0.2 mg/hr patch Commonly known as: NITRODUR - Dosed in mg/24 hr PLACE 1 PATCH (0.2 MG TOTAL) ONTO THE SKIN DAILY.   pantoprazole 40 MG tablet Commonly known as: PROTONIX TAKE 1 TABLET BY MOUTH EVERY DAY What changed: when to take this   spironolactone 25 MG tablet Commonly known as: ALDACTONE TAKE 1/2 TABLET BY MOUTH EVERY DAY What changed:   when to take this  additional instructions        [facility] Discharge Instructions Carotid Endarterectomy (CEA)  Please refer to the following instructions for your post-procedure care. Your surgeon or physician assistant will discuss any changes with you.  Activity  You are encouraged to walk as much as you can. You can slowly return to normal activities but must avoid strenuous activity and heavy lifting until your doctor tell you it's OK. Avoid activities such as vacuuming or swinging a golf club. You can drive after one week if you are comfortable and you are no longer taking prescription pain medications. It is normal to feel tired for serval weeks after your surgery. It is also normal to have difficulty with sleep habits, eating, and bowel movements after surgery. These will go away with time.  Bathing/Showering  You may shower after you come home. Do not soak in a bathtub, hot tub, or swim until the incision heals completely.  Incision Care  Shower every day. Clean your incision with mild soap and water. Pat the area dry with a clean towel. You do not need a bandage unless otherwise instructed. Do not apply any ointments or creams to your incision. You may have skin  glue on your incision. Do not peel it off. It will come off on its own in about one week. Your incision may feel thickened and raised for several weeks after your surgery. This is normal and the skin will soften over time. For Men Only: It's OK to shave around the incision but do not shave the incision itself for 2 weeks. It is common to have numbness under your chin that could last for several months.  Diet  Resume your normal diet. There are no special food restrictions following this procedure. A low fat/low cholesterol diet is recommended for all patients with vascular disease. In order to heal from your surgery, it is CRITICAL to get adequate nutrition. Your body  requires vitamins, minerals, and protein. Vegetables are the best source of vitamins and minerals. Vegetables also provide the perfect balance of protein. Processed food has little nutritional value, so try to avoid this.  Medications  Resume taking all of your medications unless your doctor or physician assistant tells you not to.  If your incision is causing pain, you may take over-the- counter pain relievers such as acetaminophen (Tylenol). If you were prescribed a stronger pain medication, please be aware these medications can cause nausea and constipation.  Prevent nausea by taking the medication with a snack or meal. Avoid constipation by drinking plenty of fluids and eating foods with a high amount of fiber, such as fruits, vegetables, and grains.  Do not take Tylenol if you are taking prescription pain medications.  Follow Up  Our office will schedule a follow up appointment 2-3 weeks following discharge.  Please call us immediately for any of the following conditions  . Increased pain, redness, drainage (pus) from your incision site. . Fever of 101 degrees or higher. . If you should develop stroke (slurred speech, difficulty swallowing, weakness on one side of your body, loss of vision) you should call 911 and go to the  nearest emergency room. .  Reduce your risk of vascular disease:  . Stop smoking. If you would like help call [facility] at 1-800-QUIT-NOW ((909) 374-1780) or [place] at 864-076-2111. . Manage your cholesterol . Maintain a desired weight . Control your diabetes . Keep your blood pressure down .  If you have any questions, please call the office at [phone].  Prescriptions given: 1.   Roxicet #6 No Refill   Disposition: home  Patient's condition: is Excellent  Follow up: 1. Dr. Myra Gianotti  in 2 weeks in research clinic   Wendi Maya, PA-C [facility] [phone]   --- For [facility] Registry use ---   Modified Rankin score at D/C (0-6): 0  IV medication needed for:  1. Hypertension: No 2. Hypotension: No  Post-op Complications: No  1. Post-op CVA or TIA: No  If yes: Event classification (right eye, left eye, right cortical, left cortical, verterobasilar, other):   If yes: Timing of event (intra-op, <6 hrs post-op, >=6 hrs post-op, unknown):   2. CN injury: No  If yes: CN  injuried   3. Myocardial infarction: No  If yes: Dx by (EKG or clinical, Troponin):   4.  CHF: No  5.  Dysrhythmia (new): No  6. Wound infection: No  7. Reperfusion symptoms: No  8. Return to OR: No  If yes: return to OR for (bleeding, neurologic, other CEA incision, other):   Discharge medications: Statin use:  Yes ASA use:  Yes   Beta blocker use:  Yes ACE-Inhibitor use:  No  ARB use:  No CCB use: No P2Y12 Antagonist use: Yes, [x ] Plavix, [ ]  Plasugrel, [ ]  Ticlopinine, [ ]  Ticagrelor, [ ]  Other, [ ]  No for medical reason, [ ]  Non-compliant, [ ]  Not-indicated Anti-coagulant use:  No, [ ]  Warfarin, [ ]  Rivaroxaban, [ ]  Dabigatran,

## 2020-08-03 NOTE — Progress Notes (Signed)
Pt provided discharge instructions and education. Telebox removed/ccmd notified. IV's removed. Vitals stable. Pt denies any complaints. Pt has all belongings. Pt to be tx via wheelchair to valet to meet ride.  Lacy Duverney, RN

## 2020-08-15 ENCOUNTER — Encounter: Payer: BC Managed Care – PPO | Admitting: Surgery

## 2020-08-18 ENCOUNTER — Encounter: Payer: BC Managed Care – PPO | Admitting: *Deleted

## 2020-08-18 VITALS — BP 125/82 | HR 67 | Wt 215.0 lb

## 2020-08-18 DIAGNOSIS — Z006 Encounter for examination for normal comparison and control in clinical research program: Secondary | ICD-10-CM

## 2020-08-18 NOTE — Research (Addendum)
2W batwire  Patient came in today feeling great. No problems with chest pains or shortness of breath. He stated "I am on cloud 9!"  No med changes for patient.   Mardelle Matte PA came down to see patient for his wound check. Incision site looks good.               Section A:  Administrative Section  Subject ID: _[id number]__ - _012_ - 015 Subject Initials: _W_ _K_ _L_  Visit Interval:    []   Screening/Baseline    []   Activation   [x]   0.5 Month       []   1 Month     []   2 Month     []   3 Month       []   6 Month     []   12 Month         []   Unscheduled, reason for visit: _________________________________________  Section B:  Physical Assessment   Date: _18_/_NOV_/_2021_     (DD / MMM / )   Weight: __215_  []  kg [x]  pounds  Blood Pressure: _125_ / _82_mmHg Heart Rate: __67__ bpm  Section B:  Device Information   Battery Battery Voltage: 3.07 V   Battery Life: 122 Months   RRT Date: 24-Oct-2030 (DD/MMM/YYYY)  Lead Impeadance Right Lead 791__ Ohms    []   Low    []   High   Left Lead _________ Ohms    []   Low    [x]   High  Programmed Settings Pathway Pulse Width Amplitude Frequency   []   Left 125 2.2 40   [x]   Right     Section C:  Programming Information (12 Month and Unscheduled - not required)   []  Programming Not Done   Date: _18_/_NOV_/_2021____     (DD / MMM / YYYY) Device information, therapy schedule and programmed settings can be found on the session summary report  Site Clinician Present: __Kimberly Jerika Wales :) ___________________  helping with programming: __Kyle Wolf___________ []   N/A  Location of CVRx person: [x]   []  Onsite Remote  Subject Experience   1. Did the subject experience transient bradycardia or hypotension during device testing? []   [x]  Yes No   2. Did the subject experience transient electrical stimulation of non-vascular tissues? []  [x]  Yes No   3. If yes to either question above, was intervention beyond reprogramming needed or was it  associated with an additional untoward event? []   []  Yes No   4a. Is there any indication that there has been unacceptable device interaction between the CVRx device and other implanted electrical stimulators or sensors device? []   [x]  []  Yes No N/A (no other device)   4b. 4b. If "Yes" to question 4a, please describe the device interaction and corrective action taken:                  Section D:  Arrythmia Interventions  Has the subject received a cardiac ablation since the last study visit? []    Yes  Date of last procedure: ____/____/_____ (DD/MMM/YYYY)   [x]  No   []  Unknown   If yes, what type and # of ablations Type []  Atrial     []  Ventricular    # of procedures: ____________   Was this pre-planned prior to CVRx implant? []  Yes    []  No (complete/update AE form)  Has the subject received a cardioversion since the last study visit? []  Yes  Date of last procedure:  ____/____/_____ (DD/MMM/YYYY)   [x]  No   []  Unknown   If yes, what type and # of cardioversions? Type []  Medications     []  Electrical Shock    # of procedures: _____________   Was this pre-planned prior to CVRx implant? []  Yes    []  No (complete/update AE form)  Section E:  Adverse Events  Have any new adverse events or updates to existing events occurred since last visit? []  Yes (complete/update AE form)   [x]  No  Section F:  Medication Changes   Have there been any changes to the subject's home use medications for Arrhythmia, Antiplatelet/Anticoagulation and Heart failure medications since the last visit? []  Yes (update Med form)   [x]  No  Section G:  Comments   NA                Section H:  Signature    Person completing form (Print Name): __Kimberly :) _________________    Signature: :) ___________ Date: __11/18/2021_____    Patient ID: _[id number]_ - _012_ - 015 Patient Initials: _W_ _K_ _L_  Visit Interval:    []   Screening/Baseline    []  Activation   [x]  0.5 Month        []  1 Month     []  2 Month     []  3 Month       []  6 Month     []  12 Month         []  Unscheduled, reason for visit: _________________________________________  SECTION B:  COVID-19 Like Illness Symptoms   Has the subject experienced any cold, flu or COVID-19 symptoms in the past 30 days? [x]   No  (skip to section C)     []  Yes, date of onset:  _____/_____ MMM/YYYY    If yes, check all symptoms that apply:   []  Fevers or chills   []   New or Worsening Cough  []  Productive  []  Dry     If yes to cough, indicate severity  []  Constant  []  Occasional, several per hour   []  New or worsened shortness of breath   []  Diarrhea   []  Altered or reduced sense of smell or taste   []  Muscle aches/Severe Fatigue   []  Chest pain or tightness   []  Sore throat   []  Nausea or vomiting  SECTION C:  COVID-19 Like Illness Testing   Has the patient been tested for COVID-19? [x]  No     []  Yes, date of test:  _____/_____ MMM/YYYY    If yes, test results:   []  Positive []  Negative []  Unknown  Has the patient been tested for COVID-19 Antibodies? [x]  No     []  Yes, date of test:  _____/_____ MMM/YYYY    If yes, test results:   []  Positive []  Negative []  Unknown  Has the patient been vaccinated for COVID-19? [x]  No     []  Yes, date of test:  _____/_____ MMM/YYYY  Has the patient been tested for Influenza ("flu")? [x]  No     []  Yes, date of test:  _____/_____ MMM/YYYY    If yes, test results:   []  Positive []  Negative []  Unknown  Has the patient been vaccinated for Influenza ("flu")? [x]  No     []  Yes, date of test:  _____/_____ MMM/YYYY  SECTION D:  COVID-19 Like Illness Exposure  Has the subject been told that they might have had COVID-19/have symptoms suggestive of COVID-19? [x]    No   []   Yes   []   Unknown  Has the subject been exposed to anyone with known or suspected COVID-19? [x]    No   []  Yes   []   Unknown  Has the subject been told that they might have had the "flu" or influenza? [x]     No   []  Yes   []   Unknown  SECTION E:  Effects of COVID Pandemic on Subject's Healthcare Interactions  Since the last study visit, did the subject feel the need to go to an emergency department or hospital for their heart failure but decided not to because of concerns about COVID-19? [x]   No   []  Yes   []  Unknown    If yes, how did they seek care (check all that apply):   []  Telemedicine visit []  In-person []  Clinic []  Urgent Care   []  Subject did not change hospital/ER use due to COVID-19 []  Other, specify: ________________________  Since the last study visit, did the subject have a cardiology/HF related appointment cancelled/rescheduled due to COVID-19 pandemic? [x]   No   []  Yes, how many? ___________   []  Unknown  Since the last study visit, did the subject have any cardiology/HF related telemedicine visit due to COVID-19 pandemic? [x]   No   []  Yes, how many? ___________   []  Unknown  Since the last study visit, did the subject have a cardiology/HF procedure cancelled/rescheduled due to COVID-19 pandemic? [x]   No   []  Yes, how many? ___________   []  Unknown  SECTION F:  Effects of COVID Pandemic on Subject's Medications  Since the last visit, did any of their heart failure medications change or stop, even for a short time?   If yes, enter change into medication eCRF [x]   No   []  Yes, how many? ___________   []  Unknown    If yes, why:   []  Instructed by Doctor []  Self-discontinued []  Unknown []  Other: ________________  Since the last visit, was the subject prescribed any medications for COVID-19? [x]   No   []  Yes   []  Unknown    If yes, what medications (generic name):  SECTION G:  Effects of COVID Pandemic on Subject's Lifestyle  How has the subject's activity/exercise level changed due to COVID-19 pandemic? [x]   No change   []  More activity/exercise   []  Less activity/exercise  How has the subject's smoking habits changed due to COVID-19? []   Does not smoke   [x]  No change    []  Smoke more   []  Smoke less  How has the subject's alcohol drinking habits changed due to COVID-19? []   Does not drink   [x]  No change   []  Drink more   []  Drink less  Section H:  Signature    Person completing form (Print Name): ___Kimberly Ivory Broad :) ________________    Signature: Mercer Pod :) ___________ Date: ____11/18/2021____     Current Outpatient Medications:  .  aspirin EC 81 MG EC tablet, Take 1 tablet (81 mg total) by mouth daily., Disp: 30 tablet, Rfl: 11 .  atorvastatin (LIPITOR) 80 MG tablet, TAKE 1 TABLET BY MOUTH EVERY DAY (Patient taking differently: Take 80 mg by mouth daily. ), Disp: 90 tablet, Rfl: 1 .  carvedilol (COREG) 3.125 MG tablet, TAKE 1 TABLET BY MOUTH TWICE A DAY WITH A MEAL (Patient taking differently: Take 3.125 mg by mouth in the morning and at bedtime. ), Disp: 180 tablet, Rfl: 3 .  clopidogrel (PLAVIX) 75 MG tablet, TAKE 1 TABLET BY MOUTH  EVERY DAY (Patient taking differently: Take 75 mg by mouth daily. ), Disp: 90 tablet, Rfl: 2 .  ENTRESTO 24-26 MG, TAKE 1 TABLET BY MOUTH TWICE A DAY (Patient taking differently: Take 1 tablet by mouth in the morning and at bedtime. ), Disp: 60 tablet, Rfl: 11 .  HYDROcodone-acetaminophen (NORCO/VICODIN) 5-325 MG tablet, Take 1 tablet by mouth every 4 (four) hours as needed for moderate pain., Disp: 6 tablet, Rfl: 0 .  nitroGLYCERIN (NITRODUR - DOSED IN MG/24 HR) 0.2 mg/hr patch, PLACE 1 PATCH (0.2 MG TOTAL) ONTO THE SKIN DAILY., Disp: 90 patch, Rfl: 1 .  nitroGLYCERIN (NITROSTAT) 0.4 MG SL tablet, Place 1 tablet (0.4 mg total) under the tongue every 5 (five) minutes x 3 doses as needed for chest pain., Disp: 25 tablet, Rfl: 12 .  pantoprazole (PROTONIX) 40 MG tablet, TAKE 1 TABLET BY MOUTH EVERY DAY (Patient taking differently: Take 40 mg by mouth daily before breakfast. ), Disp: 90 tablet, Rfl: 2 .  spironolactone (ALDACTONE) 25 MG tablet, TAKE 1/2 TABLET BY MOUTH EVERY DAY (Patient taking differently:  Take 12.5 mg by mouth every other day. In the morning), Disp: 45 tablet, Rfl: 4

## 2020-08-22 ENCOUNTER — Other Ambulatory Visit: Payer: Self-pay | Admitting: Physician Assistant

## 2020-09-01 ENCOUNTER — Other Ambulatory Visit: Payer: Self-pay

## 2020-09-01 ENCOUNTER — Emergency Department (HOSPITAL_COMMUNITY): Payer: BC Managed Care – PPO

## 2020-09-01 ENCOUNTER — Emergency Department (HOSPITAL_COMMUNITY)
Admission: EM | Admit: 2020-09-01 | Discharge: 2020-09-01 | Disposition: A | Discharge status: Home / Self Care | Payer: BC Managed Care – PPO | Attending: Emergency Medicine | Admitting: Emergency Medicine

## 2020-09-01 ENCOUNTER — Encounter (HOSPITAL_COMMUNITY): Payer: Self-pay | Admitting: Emergency Medicine

## 2020-09-01 ENCOUNTER — Telehealth: Payer: Self-pay | Admitting: Internal Medicine

## 2020-09-01 DIAGNOSIS — Z7982 Long term (current) use of aspirin: Secondary | ICD-10-CM | POA: Diagnosis not present

## 2020-09-01 DIAGNOSIS — I5089 Other heart failure: Secondary | ICD-10-CM | POA: Diagnosis not present

## 2020-09-01 DIAGNOSIS — R0602 Shortness of breath: Secondary | ICD-10-CM | POA: Insufficient documentation

## 2020-09-01 DIAGNOSIS — Z7902 Long term (current) use of antithrombotics/antiplatelets: Secondary | ICD-10-CM | POA: Insufficient documentation

## 2020-09-01 DIAGNOSIS — Z955 Presence of coronary angioplasty implant and graft: Secondary | ICD-10-CM | POA: Diagnosis not present

## 2020-09-01 DIAGNOSIS — R059 Cough, unspecified: Secondary | ICD-10-CM | POA: Insufficient documentation

## 2020-09-01 DIAGNOSIS — F17210 Nicotine dependence, cigarettes, uncomplicated: Secondary | ICD-10-CM | POA: Diagnosis not present

## 2020-09-01 DIAGNOSIS — I2510 Atherosclerotic heart disease of native coronary artery without angina pectoris: Secondary | ICD-10-CM | POA: Diagnosis not present

## 2020-09-01 DIAGNOSIS — R079 Chest pain, unspecified: Secondary | ICD-10-CM | POA: Diagnosis present

## 2020-09-01 LAB — BASIC METABOLIC PANEL
Anion gap: 9 (ref 5–15)
BUN: 14 mg/dL (ref 8–23)
CO2: 28 mmol/L (ref 22–32)
Calcium: 9.5 mg/dL (ref 8.9–10.3)
Chloride: 104 mmol/L (ref 98–111)
Creatinine, Ser: 1.31 mg/dL — ABNORMAL HIGH (ref 0.61–1.24)
GFR, Estimated: >60 mL/min (ref >60)
Glucose, Bld: 117 mg/dL — ABNORMAL HIGH (ref 70–99)
Potassium: 3.6 mmol/L (ref 3.5–5.1)
Sodium: 141 mmol/L (ref 135–145)

## 2020-09-01 LAB — TROPONIN I (HIGH SENSITIVITY)
Troponin I (High Sensitivity): 6 ng/L (ref <18)
Troponin I (High Sensitivity): 5 ng/L (ref <18)

## 2020-09-01 LAB — CBC
HCT: 45.8 % (ref 39.0–52.0)
Hemoglobin: 14.8 g/dL (ref 13.0–17.0)
MCH: 29.9 pg (ref 26.0–34.0)
MCHC: 32.3 g/dL (ref 30.0–36.0)
MCV: 92.5 fL (ref 80.0–100.0)
Platelets: 262 10*3/uL (ref 150–400)
RBC: 4.95 MIL/uL (ref 4.22–5.81)
RDW: 13.6 % (ref 11.5–15.5)
WBC: 9.8 10*3/uL (ref 4.0–10.5)
nRBC: 0 % (ref 0.0–0.2)

## 2020-09-01 MED ORDER — ASPIRIN 81 MG PO CHEW
324.0000 mg | CHEWABLE_TABLET | Freq: Once | ORAL | Status: AC
  Administered 2020-09-01: 324 mg via ORAL
  Filled 2020-09-01: qty 4

## 2020-09-01 MED ORDER — NITROGLYCERIN 0.4 MG SL SUBL: 0.4000 mg | SUBLINGUAL_TABLET | SUBLINGUAL | Status: DC | PRN

## 2020-09-01 NOTE — ED Notes (Signed)
Reviewed discharge instructions with patient and spouse. Follow-up care reviewed. Patient and spouse verbalized understanding. Patient A&Ox4, VSS, and ambulatory with steady gait upon discharge. 

## 2020-09-01 NOTE — Consult Note (Addendum)
Cardiology Admission Consult:   Patient ID: Alan White MRN: [id number]; DOB: 1961-01-10   Admission date: 09/01/2020  Primary Care Provider: Joycelyn Rua, MD [facility] Cardiologist: Chrystie Nose, MD  [facility] Electrophysiologist:  [name], MD   Chief Complaint:  Chest pain  Alan White is a 59 y.o. male who is being seen today for the evaluation of chest pain at the request of Dr. Adela Lank.  Patient Profile:   Alan White is a 59 y.o. male with hx CAD with PCI to LAD and LCX 2015 and 7 months later recurrent chest pain with stable CAD by cath, STEMI 01/31/19 with PTCA alone of mLAD, and patent stent to [facility],  CM 2020 and despite meds no improvement so ICD-St Jude, continues with tobacco. Now presents with chest pain.  History of Present Illness:   [name] with above hx CAD and prior stents and decline in EF to 25-30% with dual chamber St Jude ICD (now ABBOTT) placed 12/17/19.  His heart failure has not improved with titration of meds so evaluated for BAROSTIM NEO  Device.  Meds were difficult to titrate due to low BP.   He underwent Barostim Neo implant in Rt carotid artery.  Follow up in Research clinic 08/18/20 pt felt great.  Pt now presents to ER with L side chest pain/soreness for 3-4 days. Really since Thanksgiving- started like indigestion that would come and go, no SOB or nausea or diaphoreis  Pain travels down Lt arm really from Lt neck to elbow.  He had trouble raising the arm.  Then his Lt chest was sore feeling, more when he pressed on his chest.  Pt had been taking tums and tylenol without much change in symptoms.  With continued discomfort and into his left arm so much that he could not raise his left arm  Home NTG today prior to arrival with resolution of symptoms.  .   Na 141, K+ 3.6 Glucose 117, BUN 14 Cr 1.31 hs troponin 6  (Cr usually 1.29 to 0.98)  Hgb 14.8 WBC 9.8 plts 262  CXR clear NAD   EKG:  The ECG that was done today 09/01/20  with magnet  over Rt generator BAROSTIM NEO was personally reviewed and demonstrates SR at 62  First EKG SR but too much artifact to read ST  Segments. Repeat shows 1st degree AV block and Q waves ANT leads but no changes from 08/02/20.   BP 126/81 to 113/81 no pain at all now.  He has had both COVID vaccines and is for booster 09/05/20  Past Medical History:  Diagnosis Date  . AICD (automatic cardioverter/defibrillator) present    Abbott [employer]) [employer]ant ICD  . CAD (coronary artery disease), 12/29/13 PCI/DES LCX and PCI/DES LAD with overlapping DES  12/30/2013   cath 07/05/14 OK  . CHF (congestive heart failure) (HCC)   . Dyslipidemia, goal LDL below 70 12/30/2013  . GERD (gastroesophageal reflux disease)   . Metabolic syndrome, HgbA1C 6.0  12/30/2013  . Myocardial infarction (HCC) 2015,2020  . Sleep apnea    by history - patient denies this dx as of 08/01/20  . Smoker   . Wears partial dentures    upper    Past Surgical History:  Procedure Laterality Date  . BAROREFLEX SYSTEM INSERTION Right 08/02/2020   Procedure: BAROREFLEX SYSTEM INSERTION;  Surgeon: Hillis Range, MD;  Location: [facility] IN[facility]AS[facility] CV [facility];  Service: Cardiovascular;  Laterality: Right;  . CARDIAC CATHETERIZATION  07/05/14,01/31/19   patent  stents  . CORONARY ANGIOPLASTY WITH STENT PLACEMENT  12/29/13   DES to LCX and overlapping stents DES mid LAD  . CORONARY/GRAFT ACUTE MI REVASCULARIZATION N/A 01/31/2019   Procedure: Coronary/Graft Acute MI Revascularization;  Surgeon: Kathleene Hazel, MD;  Location: [facility]AS[facility];  Service: Cardiovascular;  Laterality: N/A;  . ICD IMPLANT N/A 12/17/2019   Procedure: ICD IMPLANT;  Surgeon: Regan Lemming, MD;  Location: [facility];  Service: Cardiovascular;  Laterality: N/A;  . LEFT HEART CATH AND CORONARY ANGIOGRAPHY N/A 01/31/2019   Procedure: LEFT HEART CATH AND CORONARY ANGIOGRAPHY;  Surgeon: Kathleene Hazel, MD;  Location: [facility];  Service:  Cardiovascular;  Laterality: N/A;  . LEFT HEART CATHETERIZATION WITH CORONARY ANGIOGRAM N/A 12/29/2013   Procedure: LEFT HEART CATHETERIZATION WITH CORONARY ANGIOGRAM;  Surgeon: [name], MD;  Location: [facility] CATH LAB;  Service: Cardiovascular;  Laterality: N/A;  . LEFT HEART CATHETERIZATION WITH CORONARY ANGIOGRAM N/A 07/05/2014   Procedure: LEFT HEART CATHETERIZATION WITH CORONARY ANGIOGRAM;  Surgeon: Peter M Swaziland, MD;  Location: [facility] CATH LAB;  Service: Cardiovascular;  Laterality: N/A;  . PERCUTANEOUS CORONARY STENT INTERVENTION (PCI-S)  12/29/2013   Procedure: PERCUTANEOUS CORONARY STENT INTERVENTION (PCI-S);  Surgeon: [name], MD;  Location: [facility] CATH LAB;  Service: Cardiovascular;;     Medications Prior to Admission: Prior to Admission medications   Medication Sig Start Date End Date Taking? Authorizing Provider  aspirin EC 81 MG EC tablet Take 1 tablet (81 mg total) by mouth daily. 02/03/19   Bhagat, Sharrell Ku, PA  atorvastatin (LIPITOR) 80 MG tablet TAKE 1 TABLET BY MOUTH EVERY DAY 08/22/20   Hilty, Lisette Abu, MD  carvedilol (COREG) 3.125 MG tablet TAKE 1 TABLET BY MOUTH TWICE A DAY WITH A MEAL Patient taking differently: Take 3.125 mg by mouth in the morning and at bedtime.  10/29/19   Hilty, Lisette Abu, MD  clopidogrel (PLAVIX) 75 MG tablet TAKE 1 TABLET BY MOUTH EVERY DAY Patient taking differently: Take 75 mg by mouth daily.  05/27/20   Camnitz, Will Daphine Deutscher, MD  ENTRESTO 24-26 MG TAKE 1 TABLET BY MOUTH TWICE A DAY Patient taking differently: Take 1 tablet by mouth in the morning and at bedtime.  05/27/20   Hilty, Lisette Abu, MD  HYDROcodone-acetaminophen (NORCO/VICODIN) 5-325 MG tablet Take 1 tablet by mouth every 4 (four) hours as needed for moderate pain. 08/03/20 08/03/21  Setzer, Lynnell Jude, PA-C  nitroGLYCERIN (NITRODUR - DOSED IN MG/24 HR) 0.2 mg/hr patch PLACE 1 PATCH (0.2 MG TOTAL) ONTO THE SKIN DAILY. 07/11/20   Hilty, Lisette Abu, MD  nitroGLYCERIN (NITROSTAT) 0.4 MG SL  tablet Place 1 tablet (0.4 mg total) under the tongue every 5 (five) minutes x 3 doses as needed for chest pain. 02/02/19   Bhagat, Sharrell Ku, PA  pantoprazole (PROTONIX) 40 MG tablet TAKE 1 TABLET BY MOUTH EVERY DAY Patient taking differently: Take 40 mg by mouth daily before breakfast.  03/17/20   Runell Gess, MD  spironolactone (ALDACTONE) 25 MG tablet TAKE 1/2 TABLET BY MOUTH EVERY DAY Patient taking differently: Take 12.5 mg by mouth every other day. In the morning 02/26/20   Hilty, Lisette Abu, MD     Allergies:   No Known Allergies  Social History:   Social History   Socioeconomic History  . Marital status: Married    Spouse name:    . Number of children: Not on file  . Years of education: Not on file  .  Highest education level: Not on file  Occupational History  . Occupation: Manager    Employer: [employer]  Tobacco Use  . Smoking status: Current Every Day Smoker    Packs/day: 0.50    Years: 40.00    Pack years: 20.00    Types: Cigarettes  . Smokeless tobacco: Never Used  Vaping Use  . Vaping Use: Never used  Substance and Sexual Activity  . Alcohol use: Yes    Comment: "Once in a blue moon"  . Drug use: No  . Sexual activity: Not on file  Other Topics Concern  . Not on file  Social History Narrative   Pt lives with wife.   Social Determinants of Health   Financial Resource Strain:   . Difficulty of Paying Living Expenses: Not on file  Food Insecurity:   . Worried About Programme researcher, broadcasting/film/video in the Last Year: Not on file  . Ran Out of Food in the Last Year: Not on file  Transportation Needs:   . Lack of Transportation (Medical): Not on file  . Lack of Transportation (Non-Medical): Not on file  Physical Activity:   . Days of Exercise per Week: Not on file  . Minutes of Exercise per Session: Not on file  Stress:   . Feeling of Stress : Not on file  Social Connections:   . Frequency of Communication with Friends and Family: Not on file  .  Frequency of Social Gatherings with Friends and Family: Not on file  . Attends Religious Services: Not on file  . Active Member of Clubs or Organizations: Not on file  . Attends Banker Meetings: Not on file  . Marital Status: Not on file  Intimate Partner Violence:   . Fear of Current or Ex-Partner: Not on file  . Emotionally Abused: Not on file  . Physically Abused: Not on file  . Sexually Abused: Not on file    Family History:   The patient's family history includes Cancer in his father and mother; Diabetes in his father and mother; Heart attack in his brother and brother; Stroke in his sister.    ROS:  Please see the history of present illness.  General:no colds or fevers, no weight changes Skin:no rashes or ulcers HEENT:no blurred vision, no congestion CV:see HPI PUL:see HPI GI:no diarrhea constipation or melena, no indigestion GU:no hematuria, no dysuria MS:no joint pain, no claudication- pain Left arm could not raise the arm  Neuro:no syncope, no lightheadedness Endo:no diabetes, no thyroid disease  All other ROS reviewed and negative.     Physical Exam/Data:   Vitals:   09/01/20 1100 09/01/20 1115 09/01/20 1130 09/01/20 1145  BP: 116/88 110/74 110/83 105/74  Pulse: 68 62 (!) 58 65  Resp: Temp:      TempSrc:      SpO2: 95% 98% 90% 96%  Weight:      Height:       No intake or output data in the 24 hours ending 09/01/20 1224 Last 3 Weights 09/01/2020 08/18/2020 08/03/2020  Weight (lbs) 215 lb 215 lb 212 lb 8.4 oz  Weight (kg) 97.523 kg 97.523 kg 96.4 kg     Body mass index is 29.16 kg/m.  General:  Well nourished, well developed, in no acute distress- symptoms resolved HEENT: normal Lymph: no adenopathy Neck: no JVD Endocrine:  No thryomegaly Vascular: No carotid bruits; pedal pulses 2+ bilaterally   Cardiac:  normal S1,Event organiserS2;  RRR; no murmur gallup or rub,  Rt side generator site healing.  Lt gen site stable.  Lungs:  clear to  auscultation bilaterally, no wheezing, rhonchi or rales  Abd: soft, nontender, no hepatomegaly  Ext: no lower ext edema Musculoskeletal:  No deformities, BUE and BLE strength normal and equal Skin: warm and dry  Neuro:  Alert and oriented X 3 MAE follows commands, no focal abnormalities noted Psych:  Normal affect    Relevant CV Studies: Echo 07/12/20 IMPRESSIONS    1. Technically difficult; akinesis of the distal anteroseptal and apical  walls; overall moderate to severe LV dysfunction; grade 2 diastolic  dysfunction.  2. Left ventricular ejection fraction, by estimation, is 30 to 35%. The  left ventricle has moderate to severely decreased function. The left  ventricle demonstrates regional wall motion abnormalities (see scoring  diagram/findings for description). Left  ventricular diastolic parameters are consistent with Grade II diastolic  dysfunction (pseudonormalization).  3. Right ventricular systolic function is normal. The right ventricular  size is normal. Tricuspid regurgitation signal is inadequate for assessing  PA pressure.  4. The mitral valve is normal in structure. Trivial mitral valve  regurgitation. No evidence of mitral stenosis.  5. The aortic valve is tricuspid. Aortic valve regurgitation is not  visualized. Mild aortic valve sclerosis is present, with no evidence of  aortic valve stenosis.  6. The inferior vena cava is normal in size with greater than 50%  respiratory variability, suggesting right atrial pressure of 3 mmHg.   FINDINGS  Left Ventricle: Left ventricular ejection fraction, by estimation, is 30  to 35%. The left ventricle has moderate to severely decreased function.  The left ventricle demonstrates regional wall motion abnormalities. The  left ventricular internal cavity  size was normal in size. There is no left ventricular hypertrophy. Left  ventricular diastolic parameters are consistent with Grade II diastolic  dysfunction  (pseudonormalization).   Right Ventricle: The right ventricular size is normal.Right ventricular  systolic function is normal. Tricuspid regurgitation signal is inadequate  for assessing PA pressure. The tricuspid regurgitant velocity is 2.06 m/s,  and with an assumed right atrial  pressure of 8 mmHg, the estimated right ventricular systolic pressure is  25.0 mmHg.   Left Atrium: Left atrial size was normal in size.   Right Atrium: Right atrial size was normal in size.   Pericardium: There is no evidence of pericardial effusion.   Mitral Valve: The mitral valve is normal in structure. Trivial mitral  valve regurgitation. No evidence of mitral valve stenosis.   Tricuspid Valve: The tricuspid valve is normal in structure. Tricuspid  valve regurgitation is trivial. No evidence of tricuspid stenosis.   Aortic Valve: The aortic valve is tricuspid. Aortic valve regurgitation is  not visualized. Mild aortic valve sclerosis is present, with no evidence  of aortic valve stenosis.   Pulmonic Valve: The pulmonic valve was not well visualized. Pulmonic valve  regurgitation is trivial. No evidence of pulmonic stenosis.   Aorta: The aortic root is normal in size and structure.   Venous: The inferior vena cava is normal in size with greater than 50%  respiratory variability, suggesting right atrial pressure of 3 mmHg.   IAS/Shunts: No atrial level shunt detected by color flow Doppler.   Additional Comments: Technically difficult; akinesis of the distal  anteroseptal and apical walls; overall moderate to severe LV dysfunction;  grade 2 diastolic dysfunction. A pacer wire is visualized.     LEFT VENTRICLE  PLAX 2D  LVIDd:  5.20 cm   Diastology  LVIDs:     3.60 cm   LV e' medial:  7.29 cm/s  LV PW:     0.80 cm   LV E/e' medial: 10.8  LV IVS:    1.20 cm   LV e' lateral:  6.39 cm/s  LVOT diam:   2.00 cm   LV E/e' lateral: 12.3  LV SV:     74    LV SV Index:  34  LVOT Area:   3.14 cm    LV Volumes (MOD)  LV vol d, MOD A2C: 103.0 ml  LV vol d, MOD A4C: 128.0 ml  LV vol s, MOD A2C: 46.0 ml  LV vol s, MOD A4C: 64.1 ml  LV SV MOD A2C:   57.0 ml  LV SV MOD A4C:   128.0 ml  LV SV MOD BP:   65.5 ml   RIGHT VENTRICLE  RV S prime:   10.20 cm/s  TAPSE (M-mode): 2.3 cm   LEFT ATRIUM       Index    RIGHT ATRIUM      Index  LA diam:    3.80 cm 1.73 cm/m RA Area:   16.30 cm  LA Vol (A2C):  64.3 ml 29.27 ml/m RA Volume:  42.20 ml 19.21 ml/m  LA Vol (A4C):  30.0 ml 13.65 ml/m  LA Biplane Vol: 47.8 ml 21.76 ml/m  AORTIC VALVE  LVOT Vmax:  111.00 cm/s  LVOT Vmean: 70.500 cm/s  LVOT VTI:  0.235 m    AORTA  Ao Root diam: 3.20 cm  Ao Asc diam: 2.90 cm   MITRAL VALVE        TRICUSPID VALVE  MV Area (PHT): 4.49 cm  TR Peak grad:  17.0 mmHg  MV Decel Time: 169 msec  TR Vmax:    206.00 cm/s  MV E velocity: 78.80 cm/s  MV A velocity: 61.70 cm/s SHUNTS  MV E/A ratio: 1.28    Systemic VTI: 0.24 m               Systemic Diam: 2.00 cm    Cardiac cath 01/31/2019 Diagnostic Dominance: Right  Intervention    Prox RCA lesion is 20% stenosed.  Previously placed Prox Cx to Mid Cx stent (unknown type) is widely patent.  Ost LAD to Prox LAD lesion is 40% stenosed.  Prox LAD lesion is 100% stenosed.  Balloon angioplasty was performed using a BALLOON [place] EMERGE MR 3.25X20.  Post intervention, there is a 0% residual stenosis.   1. Acute anterior STEMI secondary to thrombotic occlusion of the stented segment of the mid LAD  2. Successful PTCA with balloon angioplasty only of the mid LAD stented segment with excellent angiographic result 3. Patent stent mid Circumflex 4. Mild non-obstructive disease in the proximal LAD and proximal RCA 5. Elevated LVEDP  Recommendations: Will admit to ICU. Will continue DAPT with ASA and Brilinta for one year  followed by DAPT with ASA and Plavix for lifetime. Continue statin and beta blocker. Will continue Aggrastat for 18 hours post PCI given large thrombus burden. Echo in am. Likely d/c home Monday if stable.    Laboratory Data:  High Sensitivity Troponin:   Recent Labs  Lab 09/01/20 0843  TROPONINIHS 6      Chemistry Recent Labs  Lab 09/01/20 0843  NA 141  K 3.6  CL 104  CO2 28  GLUCOSE 117*  BUN 14  CREATININE 1.31*  CALCIUM 9.5  GFRNONAA >60  ANIONGAP  9    No results for input(s): PROT, ALBUMIN, AST, ALT, ALKPHOS, BILITOT in the last 168 hours. Hematology Recent Labs  Lab 09/01/20 0843  WBC 9.8  RBC 4.95  HGB 14.8  HCT 45.8  MCV 92.5  MCH 29.9  MCHC 32.3  RDW 13.6  PLT 262   BNPNo results for input(s): BNP, PROBNP in the last 168 hours.  DDimer No results for input(s): DDIMER in the last 168 hours.   Radiology/Studies:  DG Chest 2 View  Result Date: 09/01/2020 CLINICAL DATA:  Chest pain. EXAM: CHEST - 2 VIEW COMPARISON:  August 02, 2020. FINDINGS: The heart size and mediastinal contours are within normal limits. Left-sided pacemaker is unchanged in position. No pneumothorax or pleural effusion is noted. Both lungs are clear. The visualized skeletal structures are unremarkable. IMPRESSION: No active cardiopulmonary disease. Electronically Signed   By: Lupita Raider M.D.   On: 09/01/2020 08:57     Assessment and Plan:   1. Chest pain neg troponin X 2 took NTG at home, here has had ASA 324 X 1.  after talk with EP APP, will repeat EKG with magnet on Rt generator to eval and EKG was without change.  No further pain. Could have been muscular skeletal.  Pt would like to go home.  Dr. Jacques Navy to see.  Would add imdur 15 mg daily but pt is on nItrodur patch  0.2 -  May need to increase to 0.4 . And pt can eat.  If d/c'd will schedule earlier follow up with Dr. Rennis Golden or APP. 2. CAD with lat cath 01/2019 and restenosis in LAD stent with PTCA alone.  LCX stent patent  residual disease of LAD of 40% stenosis prox 3. NICM with EF 25-30% and ICD, and now with ongoing symptoms rec'd BAROSTIM NEO system and improvement in symptoms.  He is followed by research and to see them tomorrow.  He is to see ENT for further follow up of device next week.   CXR clear and euvolemic on exam.  Continue out pt meds ASA, lipitor coreg plavix entresto nitrodur patch and aldactone.        HEAR Score (for undifferentiated chest pain):    5     For questions or updates, please contact [facility] Please consult [email or web address] for contact info under     Signed, Nada Boozer, NP  09/01/2020 12:24 PM   Patient seen and examined with Nada Boozer, NP.  Agree as above, with the following exceptions and changes as noted below.  Mr. Spieler is a pleasant 59 year old gentleman currently enrolled in the [facility] wire study for Barostim who presents with approximately 1 week of chest pain.  His wife was present for the end of our exam and tells me he is intermittently compliant with his nitroglycerin patch.  The patient tells me that he will occasionally wear his patch for a shorter or longer duration then is indicated.  He has not had significant chest pain recently but in the past week has had some difficulty with symptoms similar to his prior ST elevation MI.  He took a nitroglycerin today and presented to the ER.  His symptoms have entirely resolved.  He thinks it may have been related to heartburn, and takes pantoprazole at home.  Occasionally takes Tums.  Of note, he had significant benefit from the Barostim device from a symptom standpoint.  He is very eager to go home today and has follow-up with research coordinators tomorrow.  ECG shows  no ischemic changes and troponins are normal. Gen: NAD, CV: RRR, no murmurs lungs: clear, Abd: soft, Extrem: Warm, well perfused, no edema, Neuro/Psych: alert and oriented x 3, normal mood and affect. All available labs, radiology testing, previous records  reviewed.   We participated in shared decision making with his wife present as well.  I have offered the patient admission for observation to the hospital overnight to monitor symptoms and follow telemetry.  The patient declines and would like to go home.  He feels entirely better after sublingual nitroglycerin today, and will have a low threshold to return to the hospital with any concerning symptoms.  We discussed the critical nature of his symptoms that he reports are similar to when he had an MI.  He may do well with medical management of angina, and I have encouraged him to use his nitroglycerin patch if that is what is most beneficial.  He does feel symptom free with a nitroglycerin patch on.  I have considered long-acting nitrates, however hypotension has been an issue in the past for medication titration.  I have encouraged him to take sublingual nitroglycerin and given him red flag symptoms to present to the ER particularly if he has to take 3 nitroglycerin.  We will make him close follow-up in the office.  No other medication changes are needed at this time.  Parke Poisson, MD 09/01/20 6:16 PM

## 2020-09-01 NOTE — Telephone Encounter (Signed)
Patient is scheduled for a TOC Visit Tuesday 09/06/20 at 2:15 with Edd Fabian, NP

## 2020-09-01 NOTE — ED Provider Notes (Addendum)
[facility] EMERGENCY DEPARTMENT Provider Note   CSN: [id number] Arrival date & time: 09/01/20  7616     History Chief Complaint  Patient presents with  . Chest Pain    Alan White is a 59 y.o. male.  59 yo M with a chief complaints of chest pain.  Off and on for the past couple days.  Feels like when he has had a heart attack recently.  Has had some radiation down the left arm as well.  Shortness of breath.  Seems to come and go at random.  Not specifically exertional.  No abdominal pain no nausea or vomiting.  Denies lower extremity edema.  Denies recent trauma.  Has been coughing a little bit.  He thought it was indigestion but when it did not improve and got worse this morning he came to the ED for evaluation.  Took 1 nitro with trivial improvement this morning.  The history is provided by the patient.  Chest Pain Pain location:  L chest Pain quality: sharp and shooting   Pain radiates to:  L arm Pain severity:  Mild Onset quality:  Gradual Duration:  3 days Timing:  Intermittent Progression:  Waxing and waning Chronicity:  New Relieved by:  Nothing Worsened by:  Nothing Ineffective treatments:  None tried Associated symptoms: cough and shortness of breath   Associated symptoms: no abdominal pain, no fever, no headache, no palpitations and no vomiting   Risk factors: coronary artery disease        Past Medical History:  Diagnosis Date  . AICD (automatic cardioverter/defibrillator) present    [employer]) [employer] ICD  . CAD (coronary artery disease), 12/29/13 PCI/DES LCX and PCI/DES LAD with overlapping DES  12/30/2013   cath 07/05/14 OK  . CHF (congestive heart failure) (HCC)   . Dyslipidemia, goal LDL below 70 12/30/2013  . GERD (gastroesophageal reflux disease)   . Metabolic syndrome, HgbA1C 6.0  12/30/2013  . Myocardial infarction (HCC) 2015,2020  . Sleep apnea    by history - patient denies this dx as of 08/01/20  . Smoker   . Wears partial  dentures    upper    Patient Active Problem List   Diagnosis Date Noted  . CHF (congestive heart failure), NYHA class III, chronic, combined (HCC) 08/02/2020  . Ischemic cardiomyopathy 02/02/2019  . ACS (acute coronary syndrome) (HCC) 01/31/2019  . Acute ST elevation myocardial infarction (STEMI) involving left anterior descending coronary artery (HCC) 01/31/2019  . Acute ST elevation myocardial infarction (STEMI) involving left anterior descending (LAD) coronary artery (HCC)   . Anxiety 03/04/2015  . Sleep apnea by history 07/06/2014  . Chest pain of uncertain etiology 07/03/2014  . Family history of coronary artery disease 07/03/2014  . CAD- S/P PCI 12/30/2013  . Abnormal EKG 12/30/2013  . Hyperlipidemia 12/30/2013  . Metabolic syndrome, HgbA1C 6.0  12/30/2013  . Tobacco use 12/28/2013    Past Surgical History:  Procedure Laterality Date  . BAROREFLEX SYSTEM INSERTION Right 08/02/2020   Procedure: BAROREFLEX SYSTEM INSERTION;  Surgeon: Hillis Range, MD;  Location: [facility] INVASIVE CV LAB;  Service: Cardiovascular;  Laterality: Right;  . CARDIAC CATHETERIZATION  07/05/14,01/31/19   patent stents  . CORONARY ANGIOPLASTY WITH STENT PLACEMENT  12/29/13   DES to LCX and overlapping stents DES mid LAD  . CORONARY/GRAFT ACUTE MI REVASCULARIZATION N/A 01/31/2019   Procedure: Coronary/Graft Acute MI Revascularization;  Surgeon: Kathleene Hazel, MD;  Location: [facility] INVASIVE CV LAB;  Service: Cardiovascular;  Laterality:  N/A;  . ICD IMPLANT N/A 12/17/2019   Procedure: ICD IMPLANT;  Surgeon: Regan Lemming, MD;  Location: [facility] INVASIVE CV LAB;  Service: Cardiovascular;  Laterality: N/A;  . LEFT HEART CATH AND CORONARY ANGIOGRAPHY N/A 01/31/2019   Procedure: LEFT HEART CATH AND CORONARY ANGIOGRAPHY;  Surgeon: Kathleene Hazel, MD;  Location: [facility] INVASIVE CV LAB;  Service: Cardiovascular;  Laterality: N/A;  . LEFT HEART CATHETERIZATION WITH CORONARY ANGIOGRAM N/A 12/29/2013   Procedure:  LEFT HEART CATHETERIZATION WITH CORONARY ANGIOGRAM;  Surgeon: [name], MD;  Location: [facility] CATH LAB;  Service: Cardiovascular;  Laterality: N/A;  . LEFT HEART CATHETERIZATION WITH CORONARY ANGIOGRAM N/A 07/05/2014   Procedure: LEFT HEART CATHETERIZATION WITH CORONARY ANGIOGRAM;  Surgeon: Peter M Swaziland, MD;  Location: [facility] CATH LAB;  Service: Cardiovascular;  Laterality: N/A;  . PERCUTANEOUS CORONARY STENT INTERVENTION (PCI-S)  12/29/2013   Procedure: PERCUTANEOUS CORONARY STENT INTERVENTION (PCI-S);  Surgeon: [name], MD;  Location: [facility] CATH LAB;  Service: Cardiovascular;;       Family History  Problem Relation Age of Onset  . Heart attack Brother        Deceased  . Heart attack Brother   . Stroke Sister   . Diabetes Father        also heart disease  . Cancer Father        Deceased  . Diabetes Mother   . Cancer Mother        Deceased    Social History   Tobacco Use  . Smoking status: Current Every Day Smoker    Packs/day: 0.50    Years: 40.00    Pack years: 20.00    Types: Cigarettes  . Smokeless tobacco: Never Used  Vaping Use  . Vaping Use: Never used  Substance Use Topics  . Alcohol use: Yes    Comment: "Once in a blue moon"  . Drug use: No    Home Medications Prior to Admission medications   Medication Sig Start Date End Date Taking? Authorizing Provider  aspirin EC 81 MG EC tablet Take 1 tablet (81 mg total) by mouth daily. 02/03/19   Bhagat, Sharrell Ku, PA  atorvastatin (LIPITOR) 80 MG tablet TAKE 1 TABLET BY MOUTH EVERY DAY 08/22/20   Hilty, Lisette Abu, MD  carvedilol (COREG) 3.125 MG tablet TAKE 1 TABLET BY MOUTH TWICE A DAY WITH A MEAL Patient taking differently: Take 3.125 mg by mouth in the morning and at bedtime.  10/29/19   Hilty, Lisette Abu, MD  clopidogrel (PLAVIX) 75 MG tablet TAKE 1 TABLET BY MOUTH EVERY DAY Patient taking differently: Take 75 mg by mouth daily.  05/27/20   Camnitz, Will Daphine Deutscher, MD  ENTRESTO 24-26 MG TAKE 1 TABLET BY MOUTH  TWICE A DAY Patient taking differently: Take 1 tablet by mouth in the morning and at bedtime.  05/27/20   Hilty, Lisette Abu, MD  HYDROcodone-acetaminophen (NORCO/VICODIN) 5-325 MG tablet Take 1 tablet by mouth every 4 (four) hours as needed for moderate pain. 08/03/20 08/03/21  Setzer, Lynnell Jude, PA-C  nitroGLYCERIN (NITRODUR - DOSED IN MG/24 HR) 0.2 mg/hr patch PLACE 1 PATCH (0.2 MG TOTAL) ONTO THE SKIN DAILY. 07/11/20   Hilty, Lisette Abu, MD  nitroGLYCERIN (NITROSTAT) 0.4 MG SL tablet Place 1 tablet (0.4 mg total) under the tongue every 5 (five) minutes x 3 doses as needed for chest pain. 02/02/19   Bhagat, Bhavinkumar, PA  pantoprazole (PROTONIX) 40 MG tablet TAKE 1 TABLET BY MOUTH EVERY DAY Patient taking  differently: Take 40 mg by mouth daily before breakfast.  03/17/20   Runell Gess, MD  spironolactone (ALDACTONE) 25 MG tablet TAKE 1/2 TABLET BY MOUTH EVERY DAY Patient taking differently: Take 12.5 mg by mouth every other day. In the morning 02/26/20   Hilty, Lisette Abu, MD    Allergies    Patient has no known allergies.  Review of Systems   Review of Systems  Constitutional: Negative for chills and fever.  HENT: Negative for congestion and facial swelling.   Eyes: Negative for discharge and visual disturbance.  Respiratory: Positive for cough and shortness of breath.   Cardiovascular: Positive for chest pain. Negative for palpitations.  Gastrointestinal: Negative for abdominal pain, diarrhea and vomiting.  Musculoskeletal: Negative for arthralgias and myalgias.  Skin: Negative for color change and rash.  Neurological: Negative for tremors, syncope and headaches.  Psychiatric/Behavioral: Negative for confusion and dysphoric mood.    Physical Exam Updated Vital Signs BP (!) 116/95   Pulse 64   Temp 98.9 F (37.2 C) (Oral)   Resp 18   Ht 6' (1.829 m)   Wt 97.5 kg   SpO2 94%   BMI 29.16 kg/m   Physical Exam Vitals and nursing note reviewed.  Constitutional:      Appearance:  He is well-developed.  HENT:     Head: Normocephalic and atraumatic.  Eyes:     Pupils: Pupils are equal, round, and reactive to light.  Neck:     Vascular: No JVD.  Cardiovascular:     Rate and Rhythm: Normal rate and regular rhythm.     Heart sounds: No murmur heard.  No friction rub. No gallop.   Pulmonary:     Effort: No respiratory distress.     Breath sounds: No wheezing.  Chest:     Chest wall: Tenderness present.     Comments: Tenderness to the left anterior chest wall worst along the midclavicular line about ribs 3-5.  PMS intact to the left upper extremity. Abdominal:     General: There is no distension.     Tenderness: There is no guarding or rebound.  Musculoskeletal:        General: Normal range of motion.     Cervical back: Normal range of motion and neck supple.  Skin:    Coloration: Skin is not pale.     Findings: No rash.  Neurological:     Mental Status: He is alert and oriented to person, place, and time.  Psychiatric:        Behavior: Behavior normal.     ED Results / Procedures / Treatments   Labs (all labs ordered are listed, but only abnormal results are displayed) Labs Reviewed  BASIC METABOLIC PANEL - Abnormal; Notable for the following components:      Result Value   Glucose, Bld 117 (*)    Creatinine, Ser 1.31 (*)    All other components within normal limits  CBC  TROPONIN I (HIGH SENSITIVITY)  TROPONIN I (HIGH SENSITIVITY)    EKG EKG Interpretation  Date/Time:  Thursday September 01 2020 08:31:56 EST Ventricular Rate:  85 PR Interval:  202 QRS Duration: 80 QT Interval:  348 QTC Calculation: 414 R Axis:   79 Text Interpretation: Normal sinus rhythm Low voltage QRS Cannot rule out Inferior infarct , age undetermined Possible Anterolateral infarct , age undetermined Abnormal ECG No significant change since last tracing Confirmed by Melene Plan [phone]) on 09/01/2020 9:18:30 AM   Radiology DG Chest 2 View  Result Date:  09/01/2020 CLINICAL DATA:  Chest pain. EXAM: CHEST - 2 VIEW COMPARISON:  August 02, 2020. FINDINGS: The heart size and mediastinal contours are within normal limits. Left-sided pacemaker is unchanged in position. No pneumothorax or pleural effusion is noted. Both lungs are clear. The visualized skeletal structures are unremarkable. IMPRESSION: No active cardiopulmonary disease. Electronically Signed   By: Lupita Raider M.D.   On: 09/01/2020 08:57    Procedures Procedures (including critical care time)  Medications Ordered in ED Medications  nitroGLYCERIN (NITROSTAT) SL tablet 0.4 mg (has no administration in time range)  aspirin chewable tablet 324 mg (324 mg Oral Given 09/01/20 0955)    ED Course  I have reviewed the triage vital signs and the nursing notes.  Pertinent labs & imaging results that were available during my care of the patient were reviewed by me and considered in my medical decision making (see chart for details).    MDM Rules/Calculators/A&P                          59 yo M with a chief complaints of chest pain.  Atypical in nature going on for a few days off and on, not exertional.  Unfortunately the patient has had multiple MIs and thinks this feels similar.  EKG without ischemic changes.  Will obtain lab work.  Discussed with cardiology. Down to eval.   Delta trop negative.  Patient continues to be pain free.  Awaiting cards attending to see.   Signed out to Dr. Lockie Mola, please see his note for further details of care in the ED.   The patients results and plan were reviewed and discussed.   Any x-rays performed were independently reviewed by myself.   Differential diagnosis were considered with the presenting HPI.  Medications  nitroGLYCERIN (NITROSTAT) SL tablet 0.4 mg (has no administration in time range)  aspirin chewable tablet 324 mg (324 mg Oral Given 09/01/20 0955)    Vitals:   09/01/20 1400 09/01/20 1415 09/01/20 1430 09/01/20 1500  BP: 106/73  105/75 107/79 (!) 116/95  Pulse: 63 (!) 58 73 64  Resp: 20 16 18 18   Temp:      TempSrc:      SpO2: 95% 95% 95% 94%  Weight:      Height:        Final diagnoses:  Nonspecific chest pain     Final Clinical Impression(s) / ED Diagnoses Final diagnoses:  Nonspecific chest pain    Rx / DC Orders ED Discharge Orders    None       , DO 09/01/20 1516    14/02/21, DO 09/01/20 1517

## 2020-09-01 NOTE — ED Triage Notes (Signed)
Pt reports L sided chest pain and "soreness" x3-4 days, hx of MIx3, defibrillator in place. Reports pain down L arm. Took 1 sl nitro at home that provided some relief. A/ox4, resp e/u, nad.

## 2020-09-02 ENCOUNTER — Ambulatory Visit (INDEPENDENT_AMBULATORY_CARE_PROVIDER_SITE_OTHER): Payer: BC Managed Care – PPO | Admitting: Otolaryngology

## 2020-09-02 ENCOUNTER — Encounter: Payer: BC Managed Care – PPO | Admitting: *Deleted

## 2020-09-02 VITALS — BP 106/66 | HR 80 | Wt 215.0 lb

## 2020-09-02 VITALS — Temp 97.7°F

## 2020-09-02 DIAGNOSIS — Z006 Encounter for examination for normal comparison and control in clinical research program: Secondary | ICD-10-CM

## 2020-09-02 NOTE — Progress Notes (Addendum)
Section A:  Administrative Section  Subject ID: _[id number]_ - _012____ - 015 Subject Initials: ___WKL ___ ___ Visit Interval:  (* = as required) []   Baseline     []  Implant/Pre D/C  Date of Report:   ___03__/___dec____/___2021____       (DD / MMM / )  [x]   1 Month     []  3 Month*  Name of ENT __Christopher MD_  []   6 Month*     []  12 Month*     []  Unscheduled*  Directions:  Baseline: complete sections B-E only   All other visits, complete all sections below (B-G)   Use comments box to document any abnormalities or changes from Baseline  Was the CVRx device turned off? [x]  Yes   []  No, specify reason why not: __________________________________  Was a laryngoscope used? [x]  Yes   []  No  Section B:  Dysphonia   Global Rating of Dysphonia: (Absence of recent upper respiratory infection or recent extensive voice abuse)   [x]   Normal (or clear)   []   Mild   []   Mild to Moderate   []   Moderate   []   Moderate to Severe   []   Severe   []   Aphonic  Section C:  Tongue/Pharynx   Assessment Observation Severity (complete only if abnormal); defined as:    Mild Significant impairment of functioning; patient is unable to carry out usual activities.    Moderate Patient experiences sufficient discomfort to interfere with or reduce their usual level of activity.    Severe Clinically evident impairment of functioning; patient is unable to carry out usual activities.  Pharynx at Rest [x]   Symmetric []   Mild   []  Asymmetric []  Moderate     []  Severe  Pharynx with Phonation ("ah") [x]   Symmetric []   Mild   []  Asymmetric []  Moderate     []  Severe  Tongue Atrophy [x]   Not Present []   Mild   []  Present []  Moderate     []  Severe  Tongue Mobility [x]   Normal []   Mild   []  Reduced []  Moderate     []  Severe  Tongue Protrusion [x]   Midline []   Mild Side: ____________   []  Deviation []  Moderate      []  Severe   Sensation of the Pharynx [x]   Normal []   Mild   []  Decreased []  Moderate     []   Severe  Section D:  House-Brackman Facial Paralysis Scale (Circle One)  Grade Impairment  I  [x]  Normal  II  []  Mild Dysfunction (slight weakness, normal symmetry at rest)  III  []  Moderate Dysfunction (obvious but not disfiguring weakness with synkinesis, normal symmetry at rest); Complete eye closure with maximal effort; Good forehead movement  IV  []  Moderately severe dysfunction (obvious and disfiguring asymmetry, significant synkinesis); Incomplete eye closure; moderate forehead movement  V  []  Severe dysfunction (barely perceptible motion  VI  []  Total paralysis (no movement)  Section E:  Baseline Cranial Nerve Damage  Does the subject have a presence of baseline cranial nerve dysfunction []  Yes, specify what dysfunction: _____________________________________   [x]  No  Section F:  ENT Notes (Pre-D/C and Follow-Up)  Were changes in Dysphonia from baseline or any abnormalities related to the [facility] Implant? []   Yes   [x]  No   []  Unknown   []  Not Applicable  Were changes in Tongue/Pharynx from baseline or any abnormalities related to the [facility] Implant? []   Yes   [x]   No   []  Unknown   []  Not Applicable  Were changes in House-Brackman Facial Paralysis Scale from baseline or any abnormalities related to the [facility] Implant? []   Yes   [x]  No   []  Unknown   []  Not Applicable  Were any of the above changes from baseline potentially related to the intubation for the procedure? []   Yes   [x]  No   []  Unknown   []  Not Applicable  Section G:  Cranial Nerves  Has there been new cranial nerve dysfunction since Baseline?   Marginal mandibular branch of cranial nerve VII (Facial Nerve) []  Yes, specify what changed: ___________________________________   [x]  No  Cranial nerve IX (Glossopharyngeal Nerve) []  Yes, specify what changed: ___________________________________   [x]  No  Cranial nerve X  (Vagus Nerve) []  Yes, specify what changed: ___________________________________   [x]  No   Cranial nerve XI  (Accessory Nerve) []  Yes, specify what changed: ___________________________________   [x]  No  Cranial nerve XII  (Hypoglossal Nerve) []  Yes, specify what changed: ___________________________________   [x]  No  Section H:  Comments/Notes/Describe any abnormalities/deficiencies (as well if post-operative change)

## 2020-09-02 NOTE — Research (Signed)
Patient here today for his 70M visit. Patient doing well. Feeling better since being in Er yesterday for chest pains. He has follow up with Dr Rennis Golden next week. No more chest pains since yesterday morning. Pt took 1 NTG and it relieved the pain. Troponins were negative. Will see back in one month. No changes in meds.              Section A:  Administrative Section  Subject ID: _[id number]_ - _012_ - 015 Subject Initials: _W_ _-_ _L_  Visit Interval:    []   Screening/Baseline    []   Activation   []   0.5 Month       [x]   1 Month     []   2 Month     []   3 Month       []   6 Month     []   12 Month         []   Unscheduled, reason for visit: _________________________________________  Section B:  Physical Assessment   Date: _03_/_DEC_/_2021_     (DD / MMM / YYYY)   Weight: _215__  []  kg [x]  pounds  Blood Pressure: _106_ / _66_mmHg Heart Rate: _80_ bpm  Section B:  Device Information   Battery Battery Voltage: 3.04 V   Battery Life: 91 Months   RRT Date: 09/jul/2029 (DD/MMM/YYYY)  Lead Impeadance Right Lead __1025_ Ohms    []   Low    []   High   Left Lead ________ Ohms    []   Low    [x]   High  Programmed Settings Pathway Pulse Width Amplitude Frequency   []   Left 125 3.4 40   [x]   Right     Section C:  Programming Information (12 Month and Unscheduled - not required)   []  Programming Not Done   Date: _03_/_DEC_/_2021_     (DD / MMM / ) Device information, therapy schedule and programmed settings can be found on the session summary report  Site Clinician Present: :) ________________  Employee helping with programming: ________ []   N/A  Location of [employer] person: [x]   []  Onsite Remote  Subject Experience   1. Did the subject experience transient bradycardia or hypotension during device testing? []   [x]  Yes No   2. Did the subject experience transient electrical stimulation of non-vascular tissues? []  [x]  Yes No   3. If yes to either question above, was  intervention beyond reprogramming needed or was it associated with an additional untoward event? []   []  Yes No   4a. Is there any indication that there has been unacceptable device interaction between the [employer] device and other implanted electrical stimulators or sensors device? []   [x]  []  Yes No N/A (no other device)   4b. 4b. If "Yes" to question 4a, please describe the device interaction and corrective action taken:                  Section D:  Arrythmia Interventions  Has the subject received a cardiac ablation since the last study visit? []    Yes  Date of last procedure: ____/____/_____ (DD/MMM/YYYY)   [x]  No   []  Unknown   If yes, what type and # of ablations Type []  Atrial     []  Ventricular    # of procedures: ____________   Was this pre-planned prior to [employer] implant? []  Yes    []  No (complete/update AE form)  Has the subject received a cardioversion since the  last study visit? []  Yes  Date of last procedure: ____/____/_____ (DD/MMM/YYYY)   [x]  No   []  Unknown   If yes, what type and # of cardioversions? Type []  Medications     []  Electrical Shock    # of procedures: _____________   Was this pre-planned prior to [employer] implant? []  Yes    []  No (complete/update AE form)  Section E:  Adverse Events  Have any new adverse events or updates to existing events occurred since last visit? [x]  Yes (complete/update AE form)   []  No  Section F:  Medication Changes   Have there been any changes to the subject's home use medications for Arrhythmia, Antiplatelet/Anticoagulation and Heart failure medications since the last visit? []  Yes (update Med form)   [x]  No  Section G:  Comments                   Section H:  Signature    Person completing form (Print Name): __Kimberly Eriyana Sweeten :) _____________  Signature: ___Kimberly :) _____ Date: __12/03/2021_____________    Patient ID: _[id number]_ - _012_ - 015 Patient Initials: _W_ _-_ _L_  Visit Interval:    []    Screening/Baseline    []  Activation   []  0.5 Month       [x]  1 Month     []  2 Month     []  3 Month       []  6 Month     []  12 Month         []  Unscheduled, reason for visit: _________________________________________  SECTION B:  COVID-19 Like Illness Symptoms   Has the subject experienced any cold, flu or COVID-19 symptoms in the past 30 days? [x]   No  (skip to section C)     []  Yes, date of onset:  _____/_____ MMM/YYYY    If yes, check all symptoms that apply:   []  Fevers or chills   []   New or Worsening Cough  []  Productive  []  Dry     If yes to cough, indicate severity  []  Constant  []  Occasional, several per hour   []  New or worsened shortness of breath   []  Diarrhea   []  Altered or reduced sense of smell or taste   []  Muscle aches/Severe Fatigue   []  Chest pain or tightness   []  Sore throat   []  Nausea or vomiting  SECTION C:  COVID-19 Like Illness Testing   Has the patient been tested for COVID-19? [x]  No     []  Yes, date of test:  _____/_____ MMM/YYYY    If yes, test results:   []  Positive []  Negative []  Unknown  Has the patient been tested for COVID-19 Antibodies? [x]  No     []  Yes, date of test:  _____/_____ MMM/YYYY    If yes, test results:   []  Positive []  Negative []  Unknown  Has the patient been vaccinated for COVID-19? [x]  No     []  Yes, date of test:  _____/_____ MMM/YYYY  Has the patient been tested for Influenza ("flu")? [x]  No     []  Yes, date of test:  _____/_____ MMM/YYYY    If yes, test results:   []  Positive []  Negative []  Unknown  Has the patient been vaccinated for Influenza ("flu")? [x]  No     []  Yes, date of test:  _____/_____ MMM/YYYY  SECTION D:  COVID-19 Like Illness Exposure  Has the subject been told that they might have had COVID-19/have symptoms  suggestive of COVID-19?    No    Yes     Unknown  Has the subject been exposed to anyone with known or suspected COVID-19?    No    Yes     Unknown  Has the subject been told  that they might have had the "flu" or influenza?    No    Yes     Unknown  SECTION E:  Effects of COVID Pandemic on Subject's Healthcare Interactions  Since the last study visit, did the subject feel the need to go to an emergency department or hospital for their heart failure but decided not to because of concerns about COVID-19?   No    Yes    Unknown    If yes, how did they seek care (check all that apply):    Telemedicine visit  In-person  Clinic  Urgent Care    Subject did not change hospital/ER use due to COVID-19  Other, specify: ________________________  Since the last study visit, did the subject have a cardiology/HF related appointment cancelled/rescheduled due to COVID-19 pandemic?   No    Yes, how many? ___________    Unknown  Since the last study visit, did the subject have any cardiology/HF related telemedicine visit due to COVID-19 pandemic?   No    Yes, how many? ___________    Unknown  Since the last study visit, did the subject have a cardiology/HF procedure cancelled/rescheduled due to COVID-19 pandemic?   No    Yes, how many? ___________    Unknown  SECTION F:  Effects of COVID Pandemic on Subject's Medications  Since the last visit, did any of their heart failure medications change or stop, even for a short time?   If yes, enter change into medication eCRF   No    Yes, how many? ___________    Unknown    If yes, why:    Instructed by Doctor  Self-discontinued  Unknown  Other: ________________  Since the last visit, was the subject prescribed any medications for COVID-19?   No    Yes    Unknown    If yes, what medications (generic name):  SECTION G:  Effects of COVID Pandemic on Subject's Lifestyle  How has the subject's activity/exercise level changed due to COVID-19 pandemic?   No change    More activity/exercise    Less activity/exercise  How has the subject's smoking habits changed  due to COVID-19?   Does not smoke    No change    Smoke more    Smoke less  How has the subject's alcohol drinking habits changed due to COVID-19?   Does not drink    No change    Drink more    Drink less  Section H:  Signature    Person completing form (Print Name): Mercer Pod :) _____________  Signature: Mercer Pod :) ______________________ Date: _12/06/2021______    Section A:  Administrative Section  Subject ID: _[id number]_ - _012_ - 015 Subject Initials: _W_ _-_ _L_  Visit Interval:  (* = as required)    Screening/Baseline     1 Month    3 Month*        6 Month*      12 Month*          Unscheduled*, reason for visit: _________________________________________  Section D:  Voice Handicap Index - 10   Voice Handicap Index Date:    _03_/_DEC_/_2021_  (DD / MMM / Annamarie Major)  Instructions:  These are statements that many people have used to describe their voices and the effects of their voices on their lives.  Circle the response that indicates how frequently you have the same experience:   Never Almost Never Some-times Almost Always Always  1. My voice makes it difficult for people to hear me. 0 [x]  1 []  2 []  3 []  4 []   2. People have difficulty understanding me in a noisy room. 0 [x]  1 []  2 []  3 []  4 []   3. My voice difficulties restrict personal and social life. 0 [x]  1 []  2 []  3 []  4 []   4. I feel left out of conversations because of my voice. 0 [x]  1 []  2 []  3 []  4 []   5. My voice problem causes me to lose income. 0 [x]  1 []  2 []  3 []  4 []   6. I feel as though I have to strain to produce voice. 0 [x]  1 []  2 []  3 []  4 []   7. The clarity of my voice is unpredictable. 0 [x]  1 []  2 []  3 []  4 []   8. My voice problem upsets me. 0 [x]  1 []  2 []  3 []  4 []   9. My voice makes me feel handicapped. 0 [x]  1 []  2 []  3 []  4 []   10. People ask, "What's wrong with your voice?" 0 [x]  1 []  2 []  3 []  4 []     Section D:  Eating Assessment Tool - 10    Eating Assessment Index Date:    _03_/_DEC_/_2021_  (DD / MMM / YYYY)  To what extent are the following scenarios problematic for you: (Circle the appropriate response)   No Problem    Severe Problem  1. My swallowing problem has caused me to lose weight. 0 [x]  1 []  2 []  3 []  4 []   2. My swallowing problem interferes with my ability to go out for meals. 0 [x]  1 []  2 []  3 []  4 []   3. Swallowing liquids takes extra effort. 0 [x]  1 []  2 []  3 []  4 []   4. Swallowing solids takes extra effort. 0 [x]  1 []  2 []  3 []  4 []   5. Swallowing pills takes extra effort. 0 [x]  1 []  2 []  3 []  4 []   6. Swallowing is painful. 0 [x]  1 []  2 []  3 []  4 []   7. The pleasure of eating is affected by my swallowing. 0 [x]  1 []  2 []  3 []  4 []   8. When I swallow, food sticks in my throat. 0 [x]  1 []  2 []  3 []  4 []   9. I cough when I eat. 0 [x]  1 []  2 []  3 []  4 []   10. Swallowing is stressful. 0 [x]  1 []  2 []  3 []  4 []   Section E:  Signature Section   Person Administering Questionnaire Name: (person who instructed the subject on how to complete the form) __Kimberly Kara Mierzejewski :) __ (Print)    :) ___ (Sign) _03/DEC/2021 (Date)  Person Completing Questionnaire Name: (e.g. subject, person reading questionnaire, etc.) __Kimberly :) __ (Print)  :) ___ (Sign)   03/DEC/2021 (DATE)         Current Outpatient Medications:  .  aspirin EC 81 MG EC tablet, Take 1 tablet (81 mg total) by mouth daily., Disp: 30 tablet, Rfl: 11 .  atorvastatin (LIPITOR) 80 MG tablet, TAKE 1 TABLET BY MOUTH EVERY DAY, Disp: 90 tablet, Rfl: 2 .  carvedilol (  COREG) 3.125 MG tablet, TAKE 1 TABLET BY MOUTH TWICE A DAY WITH A MEAL (Patient taking differently: Take 3.125 mg by mouth in the morning and at bedtime. ), Disp: 180 tablet, Rfl: 3 .  clopidogrel (PLAVIX) 75 MG tablet, TAKE 1 TABLET BY MOUTH EVERY DAY (Patient taking differently: Take 75 mg by mouth daily. ), Disp: 90 tablet, Rfl: 2 .   ENTRESTO 24-26 MG, TAKE 1 TABLET BY MOUTH TWICE A DAY (Patient taking differently: Take 1 tablet by mouth in the morning and at bedtime. ), Disp: 60 tablet, Rfl: 11 .  HYDROcodone-acetaminophen (NORCO/VICODIN) 5-325 MG tablet, Take 1 tablet by mouth every 4 (four) hours as needed for moderate pain., Disp: 6 tablet, Rfl: 0 .  nitroGLYCERIN (NITRODUR - DOSED IN MG/24 HR) 0.2 mg/hr patch, PLACE 1 PATCH (0.2 MG TOTAL) ONTO THE SKIN DAILY., Disp: 90 patch, Rfl: 1 .  nitroGLYCERIN (NITROSTAT) 0.4 MG SL tablet, Place 1 tablet (0.4 mg total) under the tongue every 5 (five) minutes x 3 doses as needed for chest pain., Disp: 25 tablet, Rfl: 12 .  pantoprazole (PROTONIX) 40 MG tablet, TAKE 1 TABLET BY MOUTH EVERY DAY (Patient taking differently: Take 40 mg by mouth daily before breakfast. ), Disp: 90 tablet, Rfl: 2 .  spironolactone (ALDACTONE) 25 MG tablet, TAKE 1/2 TABLET BY MOUTH EVERY DAY (Patient taking differently: Take 12.5 mg by mouth every other day. ), Disp: 45 tablet, Rfl: 4

## 2020-09-04 NOTE — Progress Notes (Unsigned)
Cardiology Clinic Note   Patient Name: Alan White Date of Encounter: 09/06/2020  Primary Care Provider:  Joycelyn Rua, MD Primary Cardiologist:  Chrystie Nose, MD  Patient Profile    Alan White 59 year old male presents the clinic today for follow-up evaluation of his coronary artery disease and chest pain.  Past Medical History    Past Medical History:  Diagnosis Date  . AICD (automatic cardioverter/defibrillator) present    [employer]) [employer] ICD  . CAD (coronary artery disease), 12/29/13 PCI/DES LCX and PCI/DES LAD with overlapping DES  12/30/2013   cath 07/05/14 OK  . CHF (congestive heart failure) (HCC)   . Dyslipidemia, goal LDL below 70 12/30/2013  . GERD (gastroesophageal reflux disease)   . Metabolic syndrome, HgbA1C 6.0  12/30/2013  . Myocardial infarction (HCC) 2015,2020  . Sleep apnea    by history - patient denies this dx as of 08/01/20  . Smoker   . Wears partial dentures    upper   Past Surgical History:  Procedure Laterality Date  . BAROREFLEX SYSTEM INSERTION Right 08/02/2020   Procedure: BAROREFLEX SYSTEM INSERTION;  Surgeon: Hillis Range, MD;  Location: [facility];  Service: Cardiovascular;  Laterality: Right;  . CARDIAC CATHETERIZATION  07/05/14,01/31/19   patent stents  . CORONARY ANGIOPLASTY WITH STENT PLACEMENT  12/29/13   DES to LCX and overlapping stents DES mid LAD  . CORONARY/GRAFT ACUTE MI REVASCULARIZATION N/A 01/31/2019   Procedure: Coronary/Graft Acute MI Revascularization;  Surgeon: Kathleene Hazel, MD;  Location: [facility];  Service: Cardiovascular;  Laterality: N/A;  . ICD IMPLANT N/A 12/17/2019   Procedure: ICD IMPLANT;  Surgeon: Regan Lemming, MD;  Location: [facility];  Service: Cardiovascular;  Laterality: N/A;  . LEFT HEART CATH AND CORONARY ANGIOGRAPHY N/A 01/31/2019   Procedure: LEFT HEART CATH AND CORONARY ANGIOGRAPHY;  Surgeon: Kathleene Hazel, MD;  Location: [facility];   Service: Cardiovascular;  Laterality: N/A;  . LEFT HEART CATHETERIZATION WITH CORONARY ANGIOGRAM N/A 12/29/2013   Procedure: LEFT HEART CATHETERIZATION WITH CORONARY ANGIOGRAM;  Surgeon: [name], MD;  Location: [facility] CATH LAB;  Service: Cardiovascular;  Laterality: N/A;  . LEFT HEART CATHETERIZATION WITH CORONARY ANGIOGRAM N/A 07/05/2014   Procedure: LEFT HEART CATHETERIZATION WITH CORONARY ANGIOGRAM;  Surgeon: Peter M Swaziland, MD;  Location: [facility] CATH LAB;  Service: Cardiovascular;  Laterality: N/A;  . PERCUTANEOUS CORONARY STENT INTERVENTION (PCI-S)  12/29/2013   Procedure: PERCUTANEOUS CORONARY STENT INTERVENTION (PCI-S);  Surgeon: [name], MD;  Location: [facility] CATH LAB;  Service: Cardiovascular;;    Allergies  No Known Allergies  History of Present Illness    Alan White has a PMH of CAD with PCI to LAD and circumflex 2015.  7 months later he had recurrent chestpain and underwent repeat cardiac catheterization which showed stable coronary anatomy.  He had a STEMI 5/20 with PTCA of the mid LAD, his circumflex stent was patent.  Due to cardiomyopathy and no improvement with medication he had a Saint Jude ICD placed (12/17/19).  He underwent Barostim Neo implant in right carotid artery (08/02/20) and is following with the research clinic 08/18/2020.  His PMH also includes tobacco use.  He presented to the emergency department 09/01/20 with complaints of left-sided chest pain/fullness x3-4 days.  He indicated that the discomfort had been present since Thanksgiving.  He described knee pain as indigestion type pain that would come and go.  Not having any shortness of breath,  nausea, diaphoresis.  He described the pain as traveling down his left arm from his left neck to his elbow.  He also had trouble raising his arm.  He reported a sore feeling in his left chest.  He reported taking Tums and Tylenol which did not provide much relief.  He took nitroglycerin at home which resolved his symptoms.   EKG 09/01/2020 over right generator Barostim Neo and demonstrated sinus rhythm first-degree AV block 62.  Blood pressure 126/82-113/81.  He reported Covid vaccinations x2 and appointment for booster 09/05/2020.  High-sensitivity troponin negative x2.  It was felt that his pain was MSK in nature.  He presents to the clinic today for follow-up evaluation states he feels well today.  No further episodes of chest pain arm pain or neck pain.  He reports that when he went to the emergency department the symptoms were the exact same as he had during his previous MI related to his chest pain and arm pain.  He feels reassured that his evaluation was negative.  He feels that after having his Barostim device placed he is breathing much better and has been much more active.  He reports that after he left to the emergency department he slept for around 13 hours and woke up feeling well rested.  He continues to increase his physical activity as tolerated and was very appreciative of his care.  He reports that the device was recently increased in intensity and he continues to see improvement.  He has follow-up with Dr. Rennis Golden in February.  We will keep his follow-up appointment for that time.  I will give him the salty 6 diet sheet, and have him maintain his physical activity.  Today he denies chest pain, shortness of breath, lower extremity edema, fatigue, palpitations, melena, hematuria, hemoptysis, diaphoresis, weakness, presyncope, syncope, orthopnea, and PND.   Home Medications    Prior to Admission medications   Medication Sig Start Date End Date Taking? Authorizing Provider  aspirin EC 81 MG EC tablet Take 1 tablet (81 mg total) by mouth daily. 02/03/19   Bhagat, Sharrell Ku, PA  atorvastatin (LIPITOR) 80 MG tablet TAKE 1 TABLET BY MOUTH EVERY DAY 08/22/20   Hilty, Lisette Abu, MD  carvedilol (COREG) 3.125 MG tablet TAKE 1 TABLET BY MOUTH TWICE A DAY WITH A MEAL Patient taking differently: Take 3.125 mg by mouth in  the morning and at bedtime.  10/29/19   Hilty, Lisette Abu, MD  clopidogrel (PLAVIX) 75 MG tablet TAKE 1 TABLET BY MOUTH EVERY DAY Patient taking differently: Take 75 mg by mouth daily.  05/27/20   Camnitz, Will Daphine Deutscher, MD  ENTRESTO 24-26 MG TAKE 1 TABLET BY MOUTH TWICE A DAY Patient taking differently: Take 1 tablet by mouth in the morning and at bedtime.  05/27/20   Hilty, Lisette Abu, MD  HYDROcodone-acetaminophen (NORCO/VICODIN) 5-325 MG tablet Take 1 tablet by mouth every 4 (four) hours as needed for moderate pain. Patient not taking: Reported on 09/01/2020 08/03/20 08/03/21  Milinda Antis, PA-C  nitroGLYCERIN (NITRODUR - DOSED IN MG/24 HR) 0.2 mg/hr patch PLACE 1 PATCH (0.2 MG TOTAL) ONTO THE SKIN DAILY. 07/11/20   Hilty, Lisette Abu, MD  nitroGLYCERIN (NITROSTAT) 0.4 MG SL tablet Place 1 tablet (0.4 mg total) under the tongue every 5 (five) minutes x 3 doses as needed for chest pain. 02/02/19   Bhagat, Bhavinkumar, PA  pantoprazole (PROTONIX) 40 MG tablet TAKE 1 TABLET BY MOUTH EVERY DAY Patient taking differently: Take 40 mg by mouth daily  before breakfast.  03/17/20   Runell Gess, MD  spironolactone (ALDACTONE) 25 MG tablet TAKE 1/2 TABLET BY MOUTH EVERY DAY Patient taking differently: Take 12.5 mg by mouth every other day.  02/26/20   Hilty, Lisette Abu, MD    Family History    Family History  Problem Relation Age of Onset  . Heart attack Brother        Deceased  . Heart attack Brother   . Stroke Sister   . Diabetes Father        also heart disease  . Cancer Father        Deceased  . Diabetes Mother   . Cancer Mother        Deceased   He indicated that his mother is deceased. He indicated that his father is deceased. He indicated that the status of his sister is unknown. He indicated that only one of his four brothers is alive.  Social History    Social History   Socioeconomic History  . Marital status: Married    Spouse name:    . Number of children: Not on file  . Years of  education: Not on file  . Highest education level: Not on file  Occupational History  . Occupation: Event organiser: [employer]  Tobacco Use  . Smoking status: Current Every Day Smoker    Packs/day: 0.50    Years: 40.00    Pack years: 20.00    Types: Cigarettes  . Smokeless tobacco: Never Used  Vaping Use  . Vaping Use: Never used  Substance and Sexual Activity  . Alcohol use: Yes    Comment: "Once in a blue moon"  . Drug use: No  . Sexual activity: Not on file  Other Topics Concern  . Not on file  Social History Narrative   Pt lives with wife.   Social Determinants of Health   Financial Resource Strain:   . Difficulty of Paying Living Expenses: Not on file  Food Insecurity:   . Worried About Programme researcher, broadcasting/film/video in the Last Year: Not on file  . Ran Out of Food in the Last Year: Not on file  Transportation Needs:   . Lack of Transportation (Medical): Not on file  . Lack of Transportation (Non-Medical): Not on file  Physical Activity:   . Days of Exercise per Week: Not on file  . Minutes of Exercise per Session: Not on file  Stress:   . Feeling of Stress : Not on file  Social Connections:   . Frequency of Communication with Friends and Family: Not on file  . Frequency of Social Gatherings with Friends and Family: Not on file  . Attends Religious Services: Not on file  . Active Member of Clubs or Organizations: Not on file  . Attends Banker Meetings: Not on file  . Marital Status: Not on file  Intimate Partner Violence:   . Fear of Current or Ex-Partner: Not on file  . Emotionally Abused: Not on file  . Physically Abused: Not on file  . Sexually Abused: Not on file     Review of Systems    General:  No chills, fever, night sweats or weight changes.  Cardiovascular:  No chest pain, dyspnea on exertion, edema, orthopnea, palpitations, paroxysmal nocturnal dyspnea. Dermatological: No rash, lesions/masses Respiratory: No cough, dyspnea  Urologic: No hematuria, dysuria Abdominal:   No nausea, vomiting, diarrhea, bright red blood per rectum, melena, or hematemesis  Neurologic:  No visual changes, wkns, changes in mental status. All other systems reviewed and are otherwise negative except as noted above.  Physical Exam    VS:  BP 108/66   Pulse (!) 59   Wt 214 lb (97.1 kg)   SpO2 96%   BMI 29.02 kg/m  , BMI Body mass index is 29.02 kg/m. GEN: Well nourished, well developed, in no acute distress. HEENT: normal. Neck: Supple, no JVD, carotid bruits, or masses. Cardiac: RRR, no murmurs, rubs, or gallops. No clubbing, cyanosis, edema.  Radials/DP/PT 2+ and equal bilaterally.  Respiratory:  Respirations regular and unlabored, clear to auscultation bilaterally. GI: Soft, nontender, nondistended, BS + x 4. MS: no deformity or atrophy. Skin: warm and dry, no rash. Neuro:  Strength and sensation are intact. Psych: Normal affect.  Accessory Clinical Findings    Recent Labs: 04/20/2020: BNP 107.0 06/13/2020: NT-Pro BNP 500 09/01/2020: BUN 14; Creatinine, Ser 1.31; Hemoglobin 14.8; Platelets 262; Potassium 3.6; Sodium 141   Recent Lipid Panel    Component Value Date/Time   CHOL 117 04/20/2020 0810   TRIG 95 04/20/2020 0810   HDL 32 (L) 04/20/2020 0810   CHOLHDL 3.7 04/20/2020 0810   CHOLHDL 4.3 08/10/2014 0810   VLDL 27 08/10/2014 0810   LDLCALC 67 04/20/2020 0810    ECG personally reviewed by me today- none today. EKG 09/29/2020 Sinus rhythm borderline PR prolongation nonspecific intraventricular conduction delay 62 bpm  Echocardiogram 07/12/2020 IMPRESSIONS    1. Technically difficult; akinesis of the distal anteroseptal and apical  walls; overall moderate to severe LV dysfunction; grade 2 diastolic  dysfunction.  2. Left ventricular ejection fraction, by estimation, is 30 to 35%. The  left ventricle has moderate to severely decreased function. The left  ventricle demonstrates regional wall motion  abnormalities (see scoring  diagram/findings for description). Left  ventricular diastolic parameters are consistent with Grade II diastolic  dysfunction (pseudonormalization).  3. Right ventricular systolic function is normal. The right ventricular  size is normal. Tricuspid regurgitation signal is inadequate for assessing  PA pressure.  4. The mitral valve is normal in structure. Trivial mitral valve  regurgitation. No evidence of mitral stenosis.  5. The aortic valve is tricuspid. Aortic valve regurgitation is not  visualized. Mild aortic valve sclerosis is present, with no evidence of  aortic valve stenosis.  6. The inferior vena cava is normal in size with greater than 50%  respiratory variability, suggesting right atrial pressure of 3 mmHg.  Assessment & Plan   1.  Atypical chest pain-no chest pain today.  Presented to the emergency department September 01, 2020 with complaints of 3-4 days of chest discomfort.  EKG showed no ischemic changes and troponins negative x2.  It was felt to be MSK in nature. Continue nltrodur Heart healthy low-sodium diet-salty 6 given Increase physical activity as tolerated  Coronary artery disease-no chest pain today.  Cardiac catheterization 5/20 showed restenosis in LAD stent and he received PTCA, circumflex stent patent, residual disease LAD 40% proximal stenosis Continue asa, atorvastatin, carvedilol, Plavix, Entresto, nitroglycerin, spironolactone Heart healthy low-sodium diet-salty 6 given Increase physical activity as tolerated  Nonischemic cardiomyopathy-euvolemic today.  No increased DOE or activity intolerance.  EF 25-30% and had ICD placed.  Had Barostim Neo device placed and is now feeling much better.  CXR in ED showed no acute changes. Continue current medical therapy Follows with research  Disposition: Follow-up with Dr. Rennis Golden as scheduled.  Thomasene Ripple. Cleaver NP-C    09/06/2020, 2:37 PM [facility] [address] Office [phone] Fax [phone]  Notice: This dictation was prepared with Dragon dictation along with smaller phrase technology. Any transcriptional errors that result from this process are unintentional and may not be corrected upon review.

## 2020-09-05 NOTE — Telephone Encounter (Signed)
Patient contacted regarding discharge from [facility] on 09/01/20 .  Patient understands to follow up with provider cleaver on 09/06/20 at 2:15 pm at [facility]. Patient understands discharge instructions? yes Patient understands medications and regiment? yes Patient understands to bring all medications to this visit? yes  Ask patient:  Are you enrolled in My Chart (yes or no)  If no ask patient if they would like to enroll.

## 2020-09-06 ENCOUNTER — Other Ambulatory Visit: Payer: Self-pay

## 2020-09-06 ENCOUNTER — Encounter: Payer: Self-pay | Admitting: General Practice

## 2020-09-06 ENCOUNTER — Ambulatory Visit (INDEPENDENT_AMBULATORY_CARE_PROVIDER_SITE_OTHER): Payer: BC Managed Care – PPO | Admitting: General Practice

## 2020-09-06 VITALS — BP 108/66 | HR 59 | Wt 214.0 lb

## 2020-09-06 DIAGNOSIS — I428 Other cardiomyopathies: Secondary | ICD-10-CM

## 2020-09-06 DIAGNOSIS — R0789 Other chest pain: Secondary | ICD-10-CM | POA: Diagnosis not present

## 2020-09-06 DIAGNOSIS — I2510 Atherosclerotic heart disease of native coronary artery without angina pectoris: Secondary | ICD-10-CM | POA: Diagnosis not present

## 2020-09-06 MED ORDER — NITROGLYCERIN 0.4 MG SL SUBL: 0.4000 mg | SUBLINGUAL_TABLET | SUBLINGUAL | 12 refills | Status: AC | PRN

## 2020-09-06 MED ORDER — SPIRONOLACTONE 25 MG PO TABS: 12.5000 mg | ORAL_TABLET | ORAL | 11 refills | Status: DC

## 2020-09-06 MED ORDER — SPIRONOLACTONE 25 MG PO TABS: 25.0000 mg | ORAL_TABLET | Freq: Every day | ORAL | 3 refills | Status: DC

## 2020-09-06 NOTE — Patient Instructions (Signed)
Medication Instructions:  The current medical regimen is effective;  continue present plan and medications as directed. Please refer to the Current Medication list given to you today.  *If you need a refill on your cardiac medications before your next appointment, please call your pharmacy*  Lab Work:   Testing/Procedures:  NONE    NONE  Special Instructions  PLEASE READ AND FOLLOW SALTY 6-ATTACHED-1,800mg  daily  PLEASE MAINTAIN PHYSICAL ACTIVITY AS TOLERATED  Follow-Up: Your next appointment:  KEEP SCHEDULED ON 11-23-2020 @330PM   In Person with K. Hilty, MD   At [facility], you and your health needs are our priority.  As part of our continuing mission to provide you with exceptional heart care, we have created designated Provider Care Teams.  These Care Teams include your primary Cardiologist (physician) and Advanced Practice Providers (APPs -  Physician Assistants and Nurse Practitioners) who all work together to provide you with the care you need, when you need it.            6 SALTY THINGS TO AVOID     1,800MG  DAILY

## 2020-09-15 ENCOUNTER — Ambulatory Visit (INDEPENDENT_AMBULATORY_CARE_PROVIDER_SITE_OTHER): Payer: BC Managed Care – PPO

## 2020-09-15 DIAGNOSIS — I255 Ischemic cardiomyopathy: Secondary | ICD-10-CM | POA: Diagnosis not present

## 2020-09-15 LAB — CUP PACEART REMOTE DEVICE CHECK
Battery Remaining Longevity: 98 mo
Battery Remaining Percentage: 89 %
Battery Voltage: 2.99 V
Brady Statistic RV Percent Paced: 1 %
Date Time Interrogation Session: 20211216010338
HighPow Impedance: 72 Ohm
Implantable Lead Implant Date: 20210318
Implantable Lead Location: 753860
Implantable Pulse Generator Implant Date: 20210318
Lead Channel Impedance Value: 450 Ohm
Lead Channel Pacing Threshold Amplitude: 0.75 V
Lead Channel Pacing Threshold Pulse Width: 0.5 ms
Lead Channel Sensing Intrinsic Amplitude: 12 mV
Lead Channel Setting Pacing Amplitude: 2.5 V
Lead Channel Setting Pacing Pulse Width: 0.5 ms
Lead Channel Setting Sensing Sensitivity: 0.5 mV
Pulse Gen Serial Number: 111013899

## 2020-09-22 ENCOUNTER — Other Ambulatory Visit: Payer: Self-pay

## 2020-09-22 MED ORDER — CARVEDILOL 3.125 MG PO TABS
ORAL_TABLET | ORAL | 3 refills | Status: DC
Start: 2020-09-22 — End: 2021-10-19

## 2020-09-29 NOTE — Progress Notes (Signed)
Remote ICD transmission.   

## 2020-10-04 ENCOUNTER — Encounter: Payer: BC Managed Care – PPO | Admitting: *Deleted

## 2020-10-04 VITALS — BP 100/72 | HR 73 | Wt 214.0 lb

## 2020-10-04 DIAGNOSIS — Z006 Encounter for examination for normal comparison and control in clinical research program: Secondary | ICD-10-CM

## 2020-10-04 NOTE — Research (Addendum)
18M  [facility]   Patient here today and doing well.      Section A:  Administrative Section  Subject ID: __[id number]__ - _012_ - 015 Subject Initials: _W_ _-_ _L_  Visit Interval:    []  Screening/Baseline    []  Activation   []  0.5 Month       []  1 Month     [x]  2 Month     []  3 Month       []  6 Month     []  12 Month         []  Unscheduled, reason for visit: _________________________________________  Section B:  Device Information   Battery Battery Voltage: 3.01 V   Battery Life: 65 Months   RRT Date: 05-Mar-2026 (DD/MMM/YYYY)  Lead Impeadance Right Lead _839__ Ohms    []  Low    []  High   Left Lead _____ Ohms    []  Low    [x]  High  Programmed Settings Pathway Pulse Width Amplitude Frequency   []  Left 125 5.4 40   [x]  Right     Section C:  Programming Information (12 Month and Unscheduled - not required)  Date: _04_/_Jan_/_2022____     (DD / MMM / YYYY) Device information, therapy schedule and programmed settings can be found on the session summary report  Site Clinician Present: ___Kimberly Alba Destine :)_____________________  [employer] Employee helping with programming: __Kyle Eliberto Ivory and Candi Gandler (updated KL:) 02/03/2021)_______ []  N/A  Location of [facility] person: [x]  []  Onsite Remote  Subject Experience   1. Did the subject experience transient bradycardia or hypotension during device testing? []  [x]  Yes No   2. Did the subject experience transient electrical stimulation of non-vascular tissues? []  [x]  Yes No   3. If yes to either question above, was intervention beyond reprogramming needed or was it associated with an additional untoward event? []  []  Yes No   4a. Is there any indication that there has been unacceptable device interaction between the [facility] device and other implanted electrical stimulators or sensors device? []  [x]  []  Yes No N/A (no other device)   4b.                   Section D:  Arrythmia Interventions  Has the subject received a cardiac ablation since the  last study visit? []  Yes  Date of last procedure:   ____/____/_____ (DD/MMM/YYYY)   [x]  No   []  Unknown   If yes, what type and # of ablations Type []  []  Atrial     []  []  Ventricular    # of procedures: ____________   Was this pre-planned prior to CV[facility] implant? []  Yes    []  No (complete/update AE form)  Has the subject received a cardioversion since the last study visit? []  Yes  Date of last procedure:   ____/____/_____ (DD/MMM/YYYY)   [x]  No   []  Unknown   If yes, what type and # of cardioversions? Type []  Medications     []  Electrical Shock    # of procedures: _____________   Was this pre-planned prior to CV[facility] implant? []  Yes    []   No (complete/update AE form)    Section E:  Adverse Events (N/A for Implant Interval)  Have any new adverse events or updates to existing events occurred since last visit? []   Yes (complete/update AE form)   [x]   No  Section F:  Medication Changes   Have there been any changes to the subject's home use medications for Arrhythmia,  Antiplatelet/Anticoagulation and Heart failure medications since the last visit? []  Yes (update Med form)   [x]  No  Section G:  Comments                     Section H:  Signature    Person completing form (Print Name): :) ________  Signature: :) ________ Date: __01/04/2022_______________    Section A:  Administrative Section  Subject ID: _[id number]_ - _012_ - 015 Subject Initials: _W_ _-_ _L_  Visit Interval:    []  Screening/Baseline    []  Activation   []  0.5 Month       []  1 Month     [x]  2 Month     []  3 Month       []  6 Month     []  12 Month         []  Unscheduled, reason for visit: _________________________________________  Section B:  Physical Assessment   Date:    _04_/_Jan_/_2022______   (DD / MMM / 11-03-1997)  Weight: _214_  []  kg    [x]  pounds Height (Screening Visit Only): ________  []  cm []  inches  Blood Pressure: _100_  / _72_mmHg Heart Rate: _73_ bpm   Section E:  Signature     Person completing form (Print Name): __Kimberly 07-08-1995 :) ______  Signature: :) _____ Date: __01/04/2022________________________    SECTION A:  Administrative Section  Patient ID - _[id number]_ - 015 Patient Initials: _W_ _-_ _L_  Visit Interval:    []  Screening/Baseline*    []  Activation   []  0.5 Month       []  1 Month     [x]  2 Month     []  3 Month       []  6 Month     []  12 Month         []  Unscheduled, reason for visit: _________________________________________  * For Screening / Baseline only the questions are based on 30 days prior to consent.  SECTION B:  COVID-19 Like Illness Symptoms   Has the subject experienced any cold, flu or COVID-19 symptoms since the last study visit? [x]  No  (skip to section C)     []  Yes, date of onset:  _____/_____ MMM/YYYY    If yes, check all symptoms that apply:   []  Fevers or chills   []  New or Worsening Cough  []  Productive  []  Dry    []  If yes to cough, indicate severity  []  Constant  []  Occasional, several per hour   []  New or worsened shortness of breath   []  Diarrhea   []  Altered or reduced sense of smell or taste   []  Muscle aches/Severe Fatigue   []  Chest pain or tightness   []  Sore throat   []  Nausea or vomiting  SECTION C:  COVID-19 Like Illness Testing   Has the patient been tested for COVID-19 since the last study visit? [x]  No     []  Yes, date of test:  _____/_____ MMM/YYYY    If yes, test results:   []  Positive []  Negative []  Unknown  Has the patient been tested for COVID-19 Antibodies since the last study visit? [x]  No     []  Yes, date of test:  _____/_____ MMM/YYYY    If yes, test results:   []  Positive []  Negative []  Unknown  Has the patient been vaccinated for COVID-19 since the last study visit? []  No     [  x] Yes, date of test:   02 / DEC_/_2021_ MMM/YYYY  Has the patient been tested for Influenza ("flu") since the last study visit? [x]  No     []  Yes, date of  test:  _____/_____ MMM/YYYY    If yes, test results:   []  Positive []  Negative []  Unknown  Has the patient been vaccinated for Influenza ("flu") since the last study visit? [x]  No     []  Yes, date of test:  _____/_____ MMM/YYYY  SECTION D:  COVID-19 Like Illness Exposure  Has the subject been told that they might have had COVID-19/have symptoms suggestive of COVID-19 since the last study visit? [x]  No   []  Yes   []  Unknown  Has the subject been exposed to anyone with known or suspected COVID-19 since the last study visit? [x]  No   []  Yes   []  Unknown  Has the subject been told that they might have had the "flu" or influenza since the last study visit? [x]  No   []  Yes   []  Unknown  SECTION E:  Effects of COVID Pandemic on Subject's Healthcare Interactions  Since the last study visit, did the subject feel the need to go to an emergency department or hospital for their heart failure but decided not to because of concerns about COVID-19? [x]  No   []  Yes   []  Unknown    If yes, how did they seek care (check all that apply):   []  Telemedicine visit []  In-person []  Clinic []  Urgent Care   []  Subject did not change hospital/ER use due to COVID-19 []  Other, specify: ________________________  Since the last study visit, did the subject have a cardiology/HF related appointment cancelled/rescheduled due to COVID-19 pandemic? [x]  No   []  Yes, how many? ___________   []  Unknown  Since the last study visit, did the subject have any cardiology/HF related telemedicine visit due to COVID-19 pandemic? [x]  No   []  Yes, how many? ___________   []  Unknown  Since the last study visit, did the subject have a cardiology/HF procedure cancelled/rescheduled due to COVID-19 pandemic? [x]  No   []  Yes, how many? ___________   []  Unknown  SECTION F:  Effects of COVID Pandemic on Subject's Medications  Since the last visit, did any of their heart failure medications change or stop, even for a short time?   If yes,  enter change into medication eCRF [x]  No   []  Yes, how many? ___________   []  Unknown    If yes, why:   []  Instructed by Doctor []  Self-discontinued []  Unknown  Other: ________________  Since the last visit, was the subject prescribed any medications for COVID-19? [x]  No   []  Yes   []  Unknown    If yes, what medications (generic name):  SECTION G:  Effects of COVID Pandemic on Subject's Lifestyle  How has the subject's activity/exercise level changed due to COVID-19 pandemic? [x]  No change   []  More activity/exercise   []  Less activity/exercise  How has the subject's smoking habits changed due to COVID-19? []  Does not smoke   [x]  No change   []  Smoke more   []  Smoke less  How has the subject's alcohol drinking habits changed due to COVID-19? []  Does not drink   [x]  No change   []  Drink more   []  Drink less  Section H:  Signature    Person completing form (Print Name): __Kimberly :)______________  Signature: :) ________ Date: __01/04/2022_________________  Current Outpatient Medications:  .  aspirin EC 81 MG EC tablet, Take 1 tablet (81 mg total) by mouth daily., Disp: 30 tablet, Rfl: 11 .  atorvastatin (LIPITOR) 80 MG tablet, TAKE 1 TABLET BY MOUTH EVERY DAY, Disp: 90 tablet, Rfl: 2 .  carvedilol (COREG) 3.125 MG tablet, TAKE 1 TABLET BY MOUTH TWICE A DAY WITH A MEAL, Disp: 180 tablet, Rfl: 3 .  clopidogrel (PLAVIX) 75 MG tablet, TAKE 1 TABLET BY MOUTH EVERY DAY (Patient taking differently: Take 75 mg by mouth daily. ), Disp: 90 tablet, Rfl: 2 .  ENTRESTO 24-26 MG, TAKE 1 TABLET BY MOUTH TWICE A DAY (Patient taking differently: Take 1 tablet by mouth in the morning and at bedtime. ), Disp: 60 tablet, Rfl: 11 .  HYDROcodone-acetaminophen (NORCO/VICODIN) 5-325 MG tablet, Take 1 tablet by mouth every 4 (four) hours as needed for moderate pain., Disp: 6 tablet, Rfl: 0 .  nitroGLYCERIN (NITRODUR - DOSED IN MG/24 HR) 0.2 mg/hr patch, PLACE 1 PATCH  (0.2 MG TOTAL) ONTO THE SKIN DAILY., Disp: 90 patch, Rfl: 1 .  nitroGLYCERIN (NITROSTAT) 0.4 MG SL tablet, Place 1 tablet (0.4 mg total) under the tongue every 5 (five) minutes x 3 doses as needed for chest pain., Disp: 25 tablet, Rfl: 12 .  pantoprazole (PROTONIX) 40 MG tablet, TAKE 1 TABLET BY MOUTH EVERY DAY (Patient taking differently: Take 40 mg by mouth daily before breakfast. ), Disp: 90 tablet, Rfl: 2 .  spironolactone (ALDACTONE) 25 MG tablet, Take 0.5 tablets (12.5 mg total) by mouth every other day., Disp: 30 tablet, Rfl: 11

## 2020-11-03 ENCOUNTER — Encounter: Payer: BC Managed Care – PPO | Admitting: *Deleted

## 2020-11-03 ENCOUNTER — Other Ambulatory Visit: Payer: Self-pay

## 2020-11-03 ENCOUNTER — Ambulatory Visit (HOSPITAL_COMMUNITY)
Admission: RE | Admit: 2020-11-03 | Discharge: 2020-11-03 | Disposition: A | Discharge status: Home / Self Care | Payer: Self-pay | Source: Ambulatory Visit | Attending: Family Medicine | Admitting: Family Medicine

## 2020-11-03 VITALS — BP 104/72 | HR 68 | Wt 207.0 lb

## 2020-11-03 DIAGNOSIS — Z006 Encounter for examination for normal comparison and control in clinical research program: Secondary | ICD-10-CM

## 2020-11-03 NOTE — Progress Notes (Signed)
VASCULAR LAB    Carotid duplex has been performed.  See CV proc for preliminary results.   Giavana Rooke, RVT 11/03/2020, 11:30 AM

## 2020-11-03 NOTE — Research (Signed)
[facility] 3M   Patient doing well, no complaints of chest pains or shortness of breath. Patient says he is feeling great. He has a goal to go play golf in the Spring.   Section A:  Administrative Section  Subject ID: _[id number]_ - _012_ - 015 Subject Initials: _W_ _K_ _L_  Visit Interval:    []  Screening/Baseline    []  Activation   []  0.5 Month       []  1 Month     []  2 Month     [x]  3 Month       []  6 Month     []  12 Month         []  Unscheduled, reason for visit: _________________________________________  Section B:  Physical Assessment   Date:    _03_/ _Feb_/_2022_   (DD / MMM / YYYY)  Weight: _207_  []  kg    [x]  pounds Height (Screening Visit Only): ________  []  cm []  inches  Blood Pressure: _104_  / _72__mmHg Heart Rate: _68_ bpm  Section E:  Signature     Person completing form (Print Name): :) ________  Signature: :) _____ Date: _02/03/2022________    SECTION A:  Administrative Section  Patient ID: - _[id number]_ - 015 Patient Initials: _W_ _K_ _L_  Visit Interval:    []  Screening/Baseline*    []  Activation   []  0.5 Month       []  1 Month     []  2 Month     [x]  3 Month       []  6 Month     []  12 Month         []  Unscheduled, reason for visit: _________________________________________  * For Screening / Baseline only the questions are based on 30 days prior to consent.  SECTION B:  COVID-19 Like Illness Symptoms   Has the subject experienced any cold, flu or COVID-19 symptoms since the last study visit? [x]  No  (skip to section C)     []  Yes, date of onset:  _____/_____ MMM/YYYY    If yes, check all symptoms that apply:   []  Fevers or chills   []  New or Worsening Cough  []  Productive  []  Dry    []  If yes to cough, indicate severity  []  Constant  []  Occasional, several per hour   []  New or worsened shortness of breath   []  Diarrhea   []  Altered or reduced sense of smell or taste   []  Muscle aches/Severe Fatigue   []  Chest pain or  tightness   []  Sore throat   []  Nausea or vomiting  SECTION C:  COVID-19 Like Illness Testing   Has the patient been tested for COVID-19 since the last study visit? [x]  No     []  Yes, date of test:  _____/_____ MMM/YYYY    If yes, test results:   []  Positive []  Negative []  Unknown  Has the patient been tested for COVID-19 Antibodies since the last study visit? [x]  No     []  Yes, date of test:  _____/_____ MMM/YYYY    If yes, test results:   []  Positive []  Negative []  Unknown  Has the patient been vaccinated for COVID-19 since the last study visit? [x]  No     []  Yes, date of test:  _____/_____ MMM/YYYY  Has the patient been tested for Influenza ("flu") since the last study visit? [x]  No     []  Yes, date of test:  _____/_____ MMM/YYYY    If yes, test results:   []  Positive []  Negative []  Unknown  Has the patient been vaccinated for Influenza ("flu") since the last study visit? [x]  No     []  Yes, date of test:  _____/_____ MMM/YYYY  SECTION D:  COVID-19 Like Illness Exposure  Has the subject been told that they might have had COVID-19/have symptoms suggestive of COVID-19 since the last study visit? [x]  No   []  Yes   []  Unknown  Has the subject been exposed to anyone with known or suspected COVID-19 since the last study visit? [x]  No   []  Yes   []  Unknown  Has the subject been told that they might have had the "flu" or influenza since the last study visit? [x]  No   []  Yes   []  Unknown  SECTION E:  Effects of COVID Pandemic on Subject's Healthcare Interactions  Since the last study visit, did the subject feel the need to go to an emergency department or hospital for their heart failure but decided not to because of concerns about COVID-19? [x]  No   []  Yes   []  Unknown    If yes, how did they seek care (check all that apply):   []  Telemedicine visit []  In-person []  Clinic []  Urgent Care   []  Subject did not change hospital/ER use due to COVID-19 []  Other, specify:  ________________________  Since the last study visit, did the subject have a cardiology/HF related appointment cancelled/rescheduled due to COVID-19 pandemic? [x]  No   []  Yes, how many? ___________   []  Unknown  Since the last study visit, did the subject have any cardiology/HF related telemedicine visit due to COVID-19 pandemic? [x]  No   []  Yes, how many? ___________   []  Unknown  Since the last study visit, did the subject have a cardiology/HF procedure cancelled/rescheduled due to COVID-19 pandemic? [x]  No   []  Yes, how many? ___________   []  Unknown  SECTION F:  Effects of COVID Pandemic on Subject's Medications  Since the last visit, did any of their heart failure medications change or stop, even for a short time?   If yes, enter change into medication eCRF [x]  No   []  Yes, how many? ___________   []  Unknown    If yes, why:   []  Instructed by Doctor []  Self-discontinued []  Unknown  Other: ________________  Since the last visit, was the subject prescribed any medications for COVID-19? [x]  No   []  Yes   []  Unknown    If yes, what medications (generic name):  SECTION G:  Effects of COVID Pandemic on Subject's Lifestyle  How has the subject's activity/exercise level changed due to COVID-19 pandemic? []  No change   []  More activity/exercise   [x]  Less activity/exercise  How has the subject's smoking habits changed due to COVID-19? []  Does not smoke   [x]  No change   []  Smoke more   []  Smoke less  How has the subject's alcohol drinking habits changed due to COVID-19? []  Does not drink   [x]  No change   []  Drink more   []  Drink less  Section H:  Signature    Person completing form (Print Name): :) ________________  Signature: :) _____ Date: __02/03/2022________________________    Carotid duplex done at [facility] visit.

## 2020-11-04 NOTE — Progress Notes (Signed)
[facility] pt #12 60M scan, pt aware of results

## 2020-11-23 ENCOUNTER — Ambulatory Visit (INDEPENDENT_AMBULATORY_CARE_PROVIDER_SITE_OTHER): Payer: BLUE CROSS/BLUE SHIELD | Admitting: Internal Medicine

## 2020-11-23 ENCOUNTER — Other Ambulatory Visit: Payer: Self-pay

## 2020-11-23 ENCOUNTER — Encounter: Payer: Self-pay | Admitting: Internal Medicine

## 2020-11-23 VITALS — BP 94/70 | HR 75 | Ht 72.0 in | Wt 213.8 lb

## 2020-11-23 DIAGNOSIS — I5022 Chronic systolic (congestive) heart failure: Secondary | ICD-10-CM

## 2020-11-23 DIAGNOSIS — Z9861 Coronary angioplasty status: Secondary | ICD-10-CM

## 2020-11-23 DIAGNOSIS — I255 Ischemic cardiomyopathy: Secondary | ICD-10-CM

## 2020-11-23 DIAGNOSIS — Z006 Encounter for examination for normal comparison and control in clinical research program: Secondary | ICD-10-CM | POA: Diagnosis not present

## 2020-11-23 DIAGNOSIS — I2510 Atherosclerotic heart disease of native coronary artery without angina pectoris: Secondary | ICD-10-CM | POA: Diagnosis not present

## 2020-11-23 NOTE — Progress Notes (Signed)
Marland Kitchen.    OFFICE NOTE  Chief Complaint:  Follow-up heart failure  Primary Care Physician: Joycelyn RuaMeyers, Stephen, MD  HPI:  Alan White is a 60 y.o. male with no history of CAD. CRFs are tobacco, FH. No known history of HTN, HL, DM. He presented 12/28/13 with a 4 day history of intermittent chest pressure. It was left substernal, radiating to both arms. It was 8/10 at its worst. It had occurred at rest and with exertion. He tried OTC PPI and Tums, without relief. One time, he took ASA which helped. It seems to happen more often in the afternoon/evening. It has also woken him from sleep. He had SOB with it it if occurred with exertion, but also describes DOE. He has had no N&V or diaphoresis. He told his wife about it 3 days ago, has had multiple episodes. He took a day off from work and came to the ER because it was scaring him. He was admitted by Dr. Rennis GoldenHilty with addition of IV heparin and NTG. Cardiac enzymes were negative- exceptfor 0.11 of troponin marker. Pt underwent cardiac cath with findings of:   1. Severe proximal to mid LAD and mid circumflex stenoses as outlined above.  2. Normal right coronary artery.  3. Wall motion abnormality involving the inferoapical wall. Preserved LVEF at 50-55%  4. Successful circumflex DES implantation post dilated to 3.75 mm in diameter and an aneurysmal portion of the vessel. 0% stenosis was noted post deployment.  5. Successful deployment of overlapping stents in the proximal to mid LAD from 99% to 0% with TIMI grade 3 flow. Postdilatation diameter was 3.0   Dual antiplatelet therapy for greater than 12 months. Brilinta can be switched to Plavix at 6 months.   Pt did well post procedure. EKG with incomplete RBBB, t wave inversions in ant lat leads. Was seen by Dr. SwazilandJordan and found stable for discharge. Will ambulate with cardiac rehab first. Have called [facility] to arrange new pt appointment, they are to call pt.   Other issues borderline diabetes will  have dietician see before discharge. Pt on statin, BB and asa, Brilinta. BP is borderline, ACE will be added as outpatient. No work until  01/04/14.  Alan White had been reporting very similar left chest discomfort that he had prior to his original stents. He reported a soreness under the left breast which improved with elevating the left breast. This is very atypical however seem to improve significantly after having his stents placed. He said that he felt great for about 3 months after having stents placed but is since become more short of breath and continues to have chest discomfort. He presented the hospital with similar complaints in early October and underwent a repeat cardiac catheterization which showed widely patent stents. He was discharged without changes in his medication. He comes back today with again similar complaints in the left chest. Unclear whether this is angina or not. He may have had some improvement with a nitrate. Again he is describing some shortness of breath.  Alan White returns today and reports she is feeling the best he has in years. He took his isosorbide for about a week and then had awful headaches and discontinued it. He did notice a change during that week and improvement in his chest pain symptoms, however. He also was switched from Brilinta to Plavix and seems to be tolerating this well. His P2 Y 12 assay indicated 221,which is adequate platelet suppression.  I saw Alan White back in  the office today. Overall he tends to feel fatigued at times. He said initially it felt the best that he had in years after his PCI but now seems to tired fairly easily. He was ordered for sleep study however that has not yet been performed. He also has some significant anxiety and continues to smoke. He's been very difficult for him to quit smoking. I do believe anxiety is playing a role in this. He denies anymore cardiac chest pain.  Alan White returns today for follow-up. Overall he is  feeling excellent today. He denies any chest pain or shortness of breath. He's been dealing with a kidney stone but he seeing a urologist for this. He has significantly cut back his smoking down to 5 cigarettes a day. He is not started Zyban, but he has filled the prescription and said that he would start the medicine if he cannot stop smoking on his own.  05/29/2018  Alan White is seen today in follow-up.  He denies any chest pain or worsening shortness of breath.  He recently saw Azalee Course, PA-C in February 2019.  He is complaining of some leg pain and underwent venous Dopplers which were negative for DVT.  He had some associated edema.  That is resolved.  He denies any worsening chest pain.  He has not had any lab work in the past couple of years.  08/20/2019  Alan White is seen today in follow-up.  I saw him recently for virtual visit.  He had interim decline in LVEF down to 25 to 30% with severe akinesis of the mid apical anteroseptal wall lateral wall and apical septum.  It was felt that there might be some nonischemic and ischemic mixed cardiomyopathy.  I did switch him over to [facility] and he is on spironolactone.  Unfortunately he had hypotension with this and was feeling very fatigued.  His spironolactone was then decreased to 12.5 mg every other day.  After doing this his blood pressure has come up now into the low 100s and he feels much better.  He is currently not working and is out until February on short-term disability.  10/26/2019  Alan White returns today for follow-up.  He underwent a repeat echo which unfortunately shows no significant improvement in LV function with EF 25 to 30%.  He is ready been referred to and seen by Dr. Elberta Fortis in early January for evaluation of AICD.  He agrees that we should proceed with this due to increased risk of sudden cardiac death.  Alan White has started doing some more exercise.  He says he does almost on a daily basis with either walking on a treadmill  or riding a stationary bicycle between 15 and 30 minutes.  Overall he says he feels now the best that he has in a while.  Recently was started on a nitro patch which he says is helped significantly as well therefore he may have either had some persistent angina or heart failure symptoms that were improved with this.  He is currently on maximal tolerated goal-directed therapy for congestive heart failure, however due to hypotension with blood pressures typically in the 90s to 100 systolic, I am unable to uptitrate his medicine further.  04/12/2020  Mr. Haskew returns today for follow-up.  Overall he says he feels some fatigue.  He is glad however that he went through with the AICD placement.  That was back in March with Dr. Elberta Fortis.  He had no issues with it since then has  done well.  Remote checks have been normal.  Unfortunately his LVEF has not improved significantly.  Blood pressure was unusually high for him today as it typically runs around 100 systolic.  This been little to no room to uptitrate his medications for heart failure.  He reports getting fatigue and seems like he is quite busy.  He works still about 8 to 10 hours a day.  He is very fatigued after this and is being pressured somewhat by his family to consider retiring.  Given his significant cardiomyopathy, he would very likely qualify for disability however he has wanted to work.  Unfortunately since his symptoms are not improving and is likely going to be a long-term issue, I would support him as I feel he would qualify for long-term disability.   11/23/2020  Mr. Spivak returns today for follow-up of his heart failure.  Recently was randomized into the bat wire trial which is a Baro stem device similar to a pacemaker that stimulates the carotid sinus.  He says he already feels somewhat better after this implantation.  Of course he has an ICD as well.  LVEF has been consistently around 30 to 35% however he notes some improvement in quality of  life.  They have been slowly increasing the output of the device and he will have a repeat echo in May of this year.  Unfortunately, blood pressure would not allow further up titration of his medications  PMHx:  Past Medical History:  Diagnosis Date  . AICD (automatic cardioverter/defibrillator) present    [employer]) Gallant ICD  . CAD (coronary artery disease), 12/29/13 PCI/DES LCX and PCI/DES LAD with overlapping DES  12/30/2013   cath 07/05/14 [place]  . CHF (congestive heart failure) (HCC)   . Dyslipidemia, goal LDL below 70 12/30/2013  . GERD (gastroesophageal reflux disease)   . Metabolic syndrome, HgbA1C 6.0  12/30/2013  . Myocardial infarction (HCC) 2015,2020  . Sleep apnea    by history - patient denies this dx as of 08/01/20  . Smoker   . Wears partial dentures    upper    Past Surgical History:  Procedure Laterality Date  . BAROREFLEX SYSTEM INSERTION Right 08/02/2020   Procedure: BAROREFLEX SYSTEM INSERTION;  Surgeon: Hillis Range, MD;  [facility];  Service: Cardiovascular;  Laterality: Right;  . CARDIAC CATHETERIZATION  07/05/14,01/31/19   patent stents  . CORONARY ANGIOPLASTY WITH STENT PLACEMENT  12/29/13   DES to LCX and overlapping stents DES mid LAD  . CORONARY/GRAFT ACUTE MI REVASCULARIZATION N/A 01/31/2019   Procedure: Coronary/Graft Acute MI Revascularization;  Surgeon: Kathleene Hazel, MD;  [facility]: [facility];  Service: Cardiovascular;  Laterality: N/A;  . ICD IMPLANT N/A 12/17/2019   Procedure: ICD IMPLANT;  Surgeon: Regan Lemming, MD;  [facility]: [facility];  Service: Cardiovascular;  Laterality: N/A;  . LEFT HEART CATH AND CORONARY ANGIOGRAPHY N/A 01/31/2019   Procedure: LEFT HEART CATH AND CORONARY ANGIOGRAPHY;  Surgeon: Kathleene Hazel, MD;  [facility];  Service: Cardiovascular;  Laterality: N/A;  . LEFT HEART CATHETERIZATION WITH CORONARY ANGIOGRAM N/A 12/29/2013   Procedure: LEFT HEART  CATHETERIZATION WITH CORONARY ANGIOGRAM;  Surgeon: [name], MD;  Location: [facility] CATH LAB;  Service: Cardiovascular;  Laterality: N/A;  . LEFT HEART CATHETERIZATION WITH CORONARY ANGIOGRAM N/A 07/05/2014   Procedure: LEFT HEART CATHETERIZATION WITH CORONARY ANGIOGRAM;  Surgeon: Peter M Swaziland, MD;  Location[facility] CATH LAB;  Service: Cardiovascular;  Laterality: N/A;  . PERCUTANEOUS CORONARY STENT INTERVENTION (PCI-S)  12/29/2013   Procedure: PERCUTANEOUS CORONARY STENT INTERVENTION (PCI-S);  Surgeon: [name], MD;  Location: [facility] CATH LAB;  Service: Cardiovascular;;    FAMHx:  Family History  Problem Relation Age of Onset  . Heart attack Brother        Deceased  . Heart attack Brother   . Stroke Sister   . Diabetes Father        also heart disease  . Cancer Father        Deceased  . Diabetes Mother   . Cancer Mother        Deceased    SOCHx:   reports that he has been smoking cigarettes. He has a 20.00 pack-year smoking history. He has never used smokeless tobacco. He reports current alcohol use. He reports that he does not use drugs.  ALLERGIES:  No Known Allergies  ROS: A comprehensive review of systems was negative.  HOME MEDS: Current Outpatient Medications  Medication Sig Dispense Refill  . aspirin EC 81 MG EC tablet Take 1 tablet (81 mg total) by mouth daily. 30 tablet 11  . atorvastatin (LIPITOR) 80 MG tablet TAKE 1 TABLET BY MOUTH EVERY DAY 90 tablet 2  . carvedilol (COREG) 3.125 MG tablet TAKE 1 TABLET BY MOUTH TWICE A DAY WITH A MEAL 180 tablet 3  . clopidogrel (PLAVIX) 75 MG tablet TAKE 1 TABLET BY MOUTH EVERY DAY 90 tablet 2  . ENTRESTO 24-26 MG TAKE 1 TABLET BY MOUTH TWICE A DAY 60 tablet 11  . HYDROcodone-acetaminophen (NORCO/VICODIN) 5-325 MG tablet Take 1 tablet by mouth every 4 (four) hours as needed for moderate pain. 6 tablet 0  . isosorbide mononitrate (IMDUR) 30 MG 24 hr tablet 1/2 tablet in the morning    . nitroGLYCERIN (NITRODUR - DOSED IN  MG/24 HR) 0.2 mg/hr patch PLACE 1 PATCH (0.2 MG TOTAL) ONTO THE SKIN DAILY. 90 patch 1  . nitroGLYCERIN (NITROSTAT) 0.4 MG SL tablet Place 1 tablet (0.4 mg total) under the tongue every 5 (five) minutes x 3 doses as needed for chest pain. 25 tablet 12  . pantoprazole (PROTONIX) 40 MG tablet TAKE 1 TABLET BY MOUTH EVERY DAY 90 tablet 2  . spironolactone (ALDACTONE) 25 MG tablet Take 0.5 tablets (12.5 mg total) by mouth every other day. 30 tablet 11   No current facility-administered medications for this visit.    LABS/IMAGING: No results found for this or any previous visit (from the past 48 hour(s)). No results found.  VITALS: BP 94/70 (Patient Position: Sitting)   Pulse 75   Ht 6' (1.829 m)   Wt 213 lb 12.8 oz (97 kg)   SpO2 96%   BMI 29.00 kg/m   EXAM: General appearance: alert and no distress Neck: no carotid bruit, no JVD and thyroid not enlarged, symmetric, no tenderness/mass/nodules Lungs: clear to auscultation bilaterally Heart: regular rate and rhythm, S1, S2 normal, no murmur, click, rub or gallop Abdomen: soft, non-tender; bowel sounds normal; no masses,  no organomegaly Extremities: extremities normal, atraumatic, no cyanosis or edema Pulses: 2+ and symmetric Skin: Skin color, texture, turgor normal. No rashes or lesions Neurologic: Grossly normal Psych: Pleasant  EKG: Deferred  ASSESSMENT: 1. Coronary artery disease status post two-vessel PCI to the left circumflex and LAD (2015) 2. Dyslipidemia on statin 3. Tobacco abuse - working on quitting 4. Anxiety 5. Mixed ischemic and nonischemic cardiomyopathy EF 25 to 30%, NYHA class II-III symptoms 6. Status  post AICD Meryl Dare, VR) - 11/2019 7. status post Batwire (Barostim device)  PLAN: 1.   Mr. Golliher is to have had some symptomatic improvement after placement of his [place] stim device.  Hopefully with increasing outputs this will combat some of his heart failure symptoms.  He has a repeat echo scheduled  in May.  We will plan follow-up after that time. Unfortunately, BP will not allow uptitration of medication at this point.  Follow-up with me in 6 months.  Chrystie Nose, MD, [facility], FACP  [place]  [facility]  Medical Director of the [facility] Lipid [facility]  Cardiovascular Risk Reduction Clinic Diplomate of the American Board of Clinical Lipidology Attending Cardiologist  Direct Dial: [phone]  Fax: [phone]  Website:  [email or web address] 11/23/2020, 4:26 PM

## 2020-11-23 NOTE — Patient Instructions (Signed)

## 2020-12-05 ENCOUNTER — Other Ambulatory Visit: Payer: Self-pay | Admitting: Cardiovascular Disease

## 2020-12-09 NOTE — Research (Signed)
Batwire Informed Consent   Subject Name: Alan White  Subject met inclusion and exclusion criteria.  The informed consent form, study requirements and expectations were reviewed with the subject and questions and concerns were addressed prior to the signing of the consent form.  The subject verbalized understanding of the trial requirements.  The subject agreed to participate in the [facility] trial and signed the informed consent 10/04/2020.  The informed consent was obtained prior to performance of any protocol-specific procedures for the subject.  A copy of the signed informed consent was given to the subject and a copy was placed in the subject's medical record.   Subject re-consented  Document Number: [id number] Rev. B Version Date: May 18, 2020 IRB approved 08/01/2020  [name]

## 2020-12-15 ENCOUNTER — Ambulatory Visit (INDEPENDENT_AMBULATORY_CARE_PROVIDER_SITE_OTHER): Payer: BLUE CROSS/BLUE SHIELD

## 2020-12-15 DIAGNOSIS — I255 Ischemic cardiomyopathy: Secondary | ICD-10-CM | POA: Diagnosis not present

## 2020-12-15 LAB — CUP PACEART REMOTE DEVICE CHECK
Battery Remaining Longevity: 109 mo
Battery Remaining Percentage: 87 %
Battery Voltage: 2.99 V
Brady Statistic RV Percent Paced: 1 %
Date Time Interrogation Session: 20220317031845
HighPow Impedance: 82 Ohm
Implantable Lead Implant Date: 20210318
Implantable Lead Location: 753860
Implantable Pulse Generator Implant Date: 20210318
Lead Channel Impedance Value: 460 Ohm
Lead Channel Pacing Threshold Amplitude: 0.75 V
Lead Channel Pacing Threshold Pulse Width: 0.5 ms
Lead Channel Sensing Intrinsic Amplitude: 12 mV
Lead Channel Setting Pacing Amplitude: 2.5 V
Lead Channel Setting Pacing Pulse Width: 0.5 ms
Lead Channel Setting Sensing Sensitivity: 0.5 mV
Pulse Gen Serial Number: 111013899

## 2020-12-23 NOTE — Progress Notes (Signed)
Remote ICD transmission.   

## 2020-12-27 ENCOUNTER — Encounter: Payer: Self-pay | Admitting: Cardiology

## 2020-12-27 ENCOUNTER — Ambulatory Visit (INDEPENDENT_AMBULATORY_CARE_PROVIDER_SITE_OTHER): Payer: BLUE CROSS/BLUE SHIELD | Admitting: Cardiology

## 2020-12-27 ENCOUNTER — Other Ambulatory Visit: Payer: Self-pay

## 2020-12-27 VITALS — BP 116/78 | HR 74 | Ht 72.0 in | Wt 216.0 lb

## 2020-12-27 DIAGNOSIS — I255 Ischemic cardiomyopathy: Secondary | ICD-10-CM

## 2020-12-27 NOTE — Progress Notes (Signed)
Electrophysiology Office Note   Date:  12/27/2020   ID:  Mrk Buzby, DOB 06/05/1961, MRN [id number]  PCP:  Joycelyn Rua, MD  Cardiologist:  Rennis Golden Primary Electrophysiologist:  Emmaleigh Longo Jorja Loa, MD    Chief Complaint: CHF   History of Present Illness: Alan White is a 60 y.o. male who is being seen today for the evaluation of CHF at the request of Joycelyn Rua, MD. Presenting today for electrophysiology evaluation.  He has a history of coronary artery disease status post multiple stents, ischemic cardiomyopathy, hyperlipidemia, obstructive sleep apnea.  He presented to the emergency room 01/31/2019 with chest pain after stopping all of his medications.  He underwent left heart catheterization which showed an occluded proximal LAD.  He received angioplasty.  He had a patent circumflex stent.  Follow-up echo September 2020 showed an ejection fraction of 25 to 30%.  He is now status post Saint Jude ICD implanted 12/17/2019.  Today, denies symptoms of palpitations, chest pain, shortness of breath, orthopnea, PND, lower extremity edema, claudication, dizziness, presyncope, syncope, bleeding, or neurologic sequela. The patient is tolerating medications without difficulties.  He is doing well.  He has no chest pain or shortness of breath.  He had a Verastem device implanted and has done well since then.  He is currently going through evaluation for that.  Otherwise he has no complaints.   Past Medical History:  Diagnosis Date  . AICD (automatic cardioverter/defibrillator) present    [employer]) Gallant ICD  . CAD (coronary artery disease), 12/29/13 PCI/DES LCX and PCI/DES LAD with overlapping DES  12/30/2013   cath 07/05/14 [place]  . CHF (congestive heart failure) (HCC)   . Dyslipidemia, goal LDL below 70 12/30/2013  . GERD (gastroesophageal reflux disease)   . Metabolic syndrome, HgbA1C 6.0  12/30/2013  . Myocardial infarction (HCC) 2015,2020  . Sleep apnea    by history - patient  denies this dx as of 08/01/20  . Smoker   . Wears partial dentures    upper   Past Surgical History:  Procedure Laterality Date  . BAROREFLEX SYSTEM INSERTION Right 08/02/2020   Procedure: BAROREFLEX SYSTEM INSERTION;  Surgeon: Hillis Range, MD;  [facility];  Service: Cardiovascular;  Laterality: Right;  . CARDIAC CATHETERIZATION  07/05/14,01/31/19   patent stents  . CORONARY ANGIOPLASTY WITH STENT PLACEMENT  12/29/13   DES to LCX and overlapping stents DES mid LAD  . CORONARY/GRAFT ACUTE MI REVASCULARIZATION N/A 01/31/2019   Procedure: Coronary/Graft Acute MI Revascularization;  Surgeon: Kathleene Hazel, MD;  [facility];  Service: Cardiovascular;  Laterality: N/A;  . ICD IMPLANT N/A 12/17/2019   Procedure: ICD IMPLANT;  Surgeon: Regan Lemming, MD;  [facility];  Service: Cardiovascular;  Laterality: N/A;  . LEFT HEART CATH AND CORONARY ANGIOGRAPHY N/A 01/31/2019   Procedure: LEFT HEART CATH AND CORONARY ANGIOGRAPHY;  Surgeon: Kathleene Hazel, MD;  [facility];  Service: Cardiovascular;  Laterality: N/A;  . LEFT HEART CATHETERIZATION WITH CORONARY ANGIOGRAM N/A 12/29/2013   Procedure: LEFT HEART CATHETERIZATION WITH CORONARY ANGIOGRAM;  Surgeon: [name], MD;  Location: [facility] CATH LAB;  Service: Cardiovascular;  Laterality: N/A;  . LEFT HEART CATHETERIZATION WITH CORONARY ANGIOGRAM N/A 07/05/2014   Procedure: LEFT HEART CATHETERIZATION WITH CORONARY ANGIOGRAM;  Surgeon: Peter M Swaziland, MD;  Location: [facility] CATH LAB;  Service: Cardiovascular;  Laterality: N/A;  . PERCUTANEOUS CORONARY STENT INTERVENTION (PCI-S)  12/29/2013   Procedure: PERCUTANEOUS  CORONARY STENT INTERVENTION (PCI-S);  Surgeon: [name], MD;  Location: [facility] CATH LAB;  Service: Cardiovascular;;     Current Outpatient Medications  Medication Sig Dispense Refill  . aspirin EC 81 MG EC tablet Take 1 tablet (81 mg total) by mouth daily.  30 tablet 11  . atorvastatin (LIPITOR) 80 MG tablet TAKE 1 TABLET BY MOUTH EVERY DAY 90 tablet 2  . carvedilol (COREG) 3.125 MG tablet TAKE 1 TABLET BY MOUTH TWICE A DAY WITH A MEAL 180 tablet 3  . clopidogrel (PLAVIX) 75 MG tablet TAKE 1 TABLET BY MOUTH EVERY DAY 90 tablet 2  . ENTRESTO 24-26 MG TAKE 1 TABLET BY MOUTH TWICE A DAY 60 tablet 11  . HYDROcodone-acetaminophen (NORCO/VICODIN) 5-325 MG tablet Take 1 tablet by mouth every 4 (four) hours as needed for moderate pain. 6 tablet 0  . isosorbide mononitrate (IMDUR) 30 MG 24 hr tablet 1/2 tablet in the morning    . nitroGLYCERIN (NITRODUR - DOSED IN MG/24 HR) 0.2 mg/hr patch PLACE 1 PATCH (0.2 MG TOTAL) ONTO THE SKIN DAILY. 90 patch 1  . nitroGLYCERIN (NITROSTAT) 0.4 MG SL tablet Place 1 tablet (0.4 mg total) under the tongue every 5 (five) minutes x 3 doses as needed for chest pain. 25 tablet 12  . pantoprazole (PROTONIX) 40 MG tablet TAKE 1 TABLET BY MOUTH EVERY DAY 90 tablet 2  . spironolactone (ALDACTONE) 25 MG tablet Take 0.5 tablets (12.5 mg total) by mouth every other day. 30 tablet 11   No current facility-administered medications for this visit.    Allergies:   Patient has no known allergies.   Social History:  The patient  reports that he has been smoking cigarettes. He has a 20.00 pack-year smoking history. He has never used smokeless tobacco. He reports current alcohol use. He reports that he does not use drugs.   Family History:  The patient's family history includes Cancer in his father and mother; Diabetes in his father and mother; Heart attack in his brother and brother; Stroke in his sister.   ROS:  Please see the history of present illness.   Otherwise, review of systems is positive for none.   All other systems are reviewed and negative.   PHYSICAL EXAM: VS:  BP 116/78   Pulse 74   Ht 6' (1.829 m)   Wt 216 lb (98 kg)   BMI 29.29 kg/m  , BMI Body mass index is 29.29 kg/m. GEN: Well nourished, well developed, in no  acute distress  HEENT: normal  Neck: no JVD, carotid bruits, or masses Cardiac: RRR; no murmurs, rubs, or gallops,no edema  Respiratory:  clear to auscultation bilaterally, normal work of breathing GI: soft, nontender, nondistended, + BS MS: no deformity or atrophy  Skin: warm and dry, device site well healed Neuro:  Strength and sensation are intact Psych: euthymic mood, full affect  EKG:  EKG is not ordered today. Personal review of the ekg ordered 09/02/20 shows sinus rhythm, rate 85, high frequency noise  Personal review of the device interrogation today. Results in [facility]   Recent Labs: 04/20/2020: BNP 107.0 06/13/2020: NT-Pro BNP 500 09/01/2020: BUN 14; Creatinine, Ser 1.31; Hemoglobin 14.8; Platelets 262; Potassium 3.6; Sodium 141    Lipid Panel     Component Value Date/Time   CHOL 117 04/20/2020 0810   TRIG 95 04/20/2020 0810   HDL 32 (L) 04/20/2020 0810   CHOLHDL 3.7 04/20/2020 0810   CHOLHDL 4.3 08/10/2014 0810   VLDL 27  08/10/2014 0810   LDLCALC 67 04/20/2020 0810     Wt Readings from Last 3 Encounters:  12/27/20 216 lb (98 kg)  11/23/20 213 lb 12.8 oz (97 kg)  11/03/20 207 lb (93.9 kg)      Other studies Reviewed: Additional studies/ records that were reviewed today include: TTE 09/30/19  Review of the above records today demonstrates:   1. Left ventricular ejection fraction, by visual estimation, is 25 to 30%. The left ventricle has severely decreased function. There is aneurysmal dilatation of all of the apical segments and akinesis of the mid anteroseptal, anterior, anterolateral  walls. NO paical thrombus is seen.  2. There is mildly increased left ventricular wall thickness.  3. Left ventricular diastolic parameters are consistent with Grade I diastolic dysfunction (impaired relaxation).  4. The left ventricle demonstrates regional wall motion abnormalities.  5. Left atrial size was mildly dilated.  6. Mild mitral valve regurgitation.  7. Tricuspid  valve regurgitation is mild.  8. Tricuspid valve regurgitation is mild.   ASSESSMENT AND PLAN:  1.  Ischemic cardiomyopathy: Ejection fraction 25 to 30%.  Is currently on optimal medical therapy with Entresto, Aldactone, carvedilol.  Status post Saint Jude ICD implanted 12/17/2019.  Device functioning appropriately.  No changes.    2.  Coronary artery disease: No current chest pain.  Continue current management per primary cardiology.  3.  Hyperlipidemia: Continue statin per primary cardiology   Current medicines are reviewed at length with the patient today.   The patient does not have concerns regarding his medicines.  The following changes were made today:  none  Labs/ tests ordered today include:  No orders of the defined types were placed in this encounter.    Disposition:   FU with Naveed Humphres 12 months  Signed, Yvonnia Tango Jorja Loa, MD  12/27/2020 2:22 PM     [facility] [address] [place] [address] [phone] (office) [phone] (fax)

## 2021-01-31 NOTE — Research (Addendum)
LATE ENTRY   Section A:  Administrative Section  Subject ID: _[id number]_ - _012_ - 015 Subject Initials: _W_ _-_ _L_  Visit Interval:    []  Screening/Baseline    []  Activation   []  0.5 Month       []  1 Month     []  2 Month     [x]  3 Month       []  6 Month     []  12 Month         []  Unscheduled, reason for visit: _________________________________________  Section B:  Device Information   Battery Battery Voltage: 2.99 V   Battery Life:   46 (updated KL:) 02/03/2021 Months   RRT Date: 18-Aug-2024 (DD/MMM/YYYY)  Lead Impeadance Right Lead __786_ Ohms    []  Low    []  High   Left Lead ______ Ohms    []  Low    [x]  High  Programmed Settings Pathway Pulse Width Amplitude Frequency   []  Left 125 7.4 40   [x]  Right     Section C:  Programming Information (12 Month and Unscheduled - not required)  Date: _03_/_Feb_/_2022_     (DD / MMM / YYYY) Device information, therapy schedule and programmed settings can be found on the session summary report  Site Clinician Present: :) _____________  helping with programming: __Kyle Wolf_______ []  N/A  Location of [facility] person: [x]  []  Onsite Remote  Subject Experience   1. Did the subject experience transient bradycardia or hypotension during device testing? []  []  Yes No   2. Did the subject experience transient electrical stimulation of non-vascular tissues? []  [x]  Yes No   3. If yes to either question above, was intervention beyond reprogramming needed or was it associated with an additional untoward event? []  []  Yes No   4a. Is there any indication that there has been unacceptable device interaction between the [facility] device and other implanted electrical stimulators or sensors device? []  [x]  []  Yes No N/A (no other device)   4b.                   Section D:  Arrythmia Interventions  Has the subject received a cardiac ablation since the last study visit? []  Yes  Date of last procedure:    ____/____/_____ (DD/MMM/YYYY)   [x]  No   []  Unknown   If yes, what type and # of ablations Type []  []  Atrial     []  []  Ventricular    # of procedures: ____________   Was this pre-planned prior to [facility] implant? []  Yes    []  No (complete/update AE form)  Has the subject received a cardioversion since the last study visit? []  Yes  Date of last procedure:   ____/____/_____ (DD/MMM/YYYY)   [x]  No   []  Unknown   If yes, what type and # of cardioversions? Type []  Medications     []  Electrical Shock    # of procedures: _____________   Was this pre-planned prior to [facility] implant? []  Yes    []   No (complete/update AE form)    Section E:  Adverse Events (N/A for Implant Interval)  Have any new adverse events or updates to existing events occurred since last visit? []   Yes (complete/update AE form)   [x]   No  Section F:  Medication Changes   Have there been any changes to the subject's home use medications for Arrhythmia, Antiplatelet/Anticoagulation and Heart failure medications since the last visit? []  Yes (update  Med form)   [x]  No  Section G:  Comments                     Section H:  Signature    Person completing form (Print Name): __Kimberly :)______________  Signature: Ivory Broad :) __ Date: _05/11/2020 _____________

## 2021-02-01 ENCOUNTER — Other Ambulatory Visit: Payer: Self-pay | Admitting: *Deleted

## 2021-02-01 DIAGNOSIS — Z006 Encounter for examination for normal comparison and control in clinical research program: Secondary | ICD-10-CM

## 2021-02-01 NOTE — Progress Notes (Signed)
Orders only for carotid and echo

## 2021-02-02 ENCOUNTER — Encounter: Payer: BC Managed Care – PPO | Admitting: *Deleted

## 2021-02-02 ENCOUNTER — Other Ambulatory Visit: Payer: Self-pay

## 2021-02-02 ENCOUNTER — Ambulatory Visit (HOSPITAL_BASED_OUTPATIENT_CLINIC_OR_DEPARTMENT_OTHER)
Admission: RE | Admit: 2021-02-02 | Discharge: 2021-02-02 | Disposition: A | Discharge status: Home / Self Care | Payer: BC Managed Care – PPO | Source: Ambulatory Visit

## 2021-02-02 ENCOUNTER — Ambulatory Visit (HOSPITAL_COMMUNITY)
Admission: RE | Admit: 2021-02-02 | Discharge: 2021-02-02 | Disposition: A | Discharge status: Home / Self Care | Payer: BC Managed Care – PPO | Source: Ambulatory Visit | Attending: Internal Medicine | Admitting: Internal Medicine

## 2021-02-02 VITALS — BP 115/92 | HR 74 | Wt 213.0 lb

## 2021-02-02 DIAGNOSIS — Z006 Encounter for examination for normal comparison and control in clinical research program: Secondary | ICD-10-CM

## 2021-02-02 NOTE — Progress Notes (Signed)
  Echocardiogram 2D Echocardiogram has been performed.  Alan White Alan White Alan White 02/02/2021, 2:16 PM

## 2021-02-03 NOTE — Progress Notes (Signed)
Carotid pt 012 6 month evaluation, patient aware

## 2021-02-09 NOTE — Research (Addendum)
10M [facility] patient.   Patient is doing well today. Since his last visit he had a few weeks of feeling yucky and not wanting to do anything but lay around. He said his head felt like it was going to explode. He said his throat was bothering him when he swallowed.  He just didn't feel right I asked him why he didn't call anyone and he said he didn't want to bother anyone. I told him to always call when stuff like that happens so we can figure out what is going on.  He stated within the last 10 days he has felt like a teenager again, maybe a slight headache but nothing like before.  Today we adjusted his device and he said that he felt so much better. When we adjusted his device he said it was a like a switch and he felt so much better.  I told patient to always call me with any concerns or call 911 if having trouble breathing and/or can't swallow.      Section A:  Administrative Section  Subject ID: _[id number]_ - _ 012 Lovey Newcomer :) _ - 015 Subject Initials: _W_ _-_ _L_  Visit Interval:    []  Screening/Baseline    []  Activation   []  0.5 Month       []  1 Month     []  2 Month     []  3 Month       [x]  6 Month     []  12 Month         []  Unscheduled, reason for visit: _________________________________________  Section B:  Device Information   Battery Battery Voltage: 2.94 V   Battery Life: 44 Months   RRT Date: 10-Oct-2024 (DD/MMM/YYYY)  Lead Impeadance Right Lead _871_ Ohms    []  Low    []  High   Left Lead ______ Ohms    []  Low    [x]  High  Programmed Settings Pathway Pulse Width Amplitude Frequency   []  Left 125 7 40   [x]  Right     Section C:  Programming Information (12 Month and Unscheduled - not required)  Date: _05_/_MAY_/_2022_     (DD / MMM / Annamarie Major) Device information, therapy schedule and programmed settings can be found on the session summary report  Site Clinician Present: Mercer Pod :) _________________  [employer] helping with programming: _Dexter and Ronaldo Miyamoto ___ []  N/A  Location  of [facility] person: [x]  []  Onsite Remote  Subject Experience   1. Did the subject experience transient bradycardia or hypotension during device testing? []  [x]  Yes No   2. Did the subject experience transient electrical stimulation of non-vascular tissues? []  [x]  Yes No   3. If yes to either question above, was intervention beyond reprogramming needed or was it associated with an additional untoward event? []  []  Yes No   4a. Is there any indication that there has been unacceptable device interaction between the [facility] device and other implanted electrical stimulators or sensors device? []  [x]  []  Yes No N/A (no other device)   4b.       Section D:  Arrythmia Interventions  Has the subject received a cardiac ablation since the last study visit? []  Yes  Date of last procedure:   ____/____/_____ (DD/MMM/YYYY)   [x]  No   []  Unknown   If yes, what type and # of ablations Type []  []  Atrial     []  []  Ventricular    # of procedures: ____________   Was this pre-planned  prior to [facility] implant? []  Yes    []  No (complete/update AE form)  Has the subject received a cardioversion since the last study visit? []  Yes  Date of last procedure:   ____/____/_____ (DD/MMM/YYYY)   [x]  No   []  Unknown   If yes, what type and # of cardioversions? Type []  Medications     []  Electrical Shock    # of procedures: _____________   Was this pre-planned prior to [facility] implant? []  Yes    []   No (complete/update AE form)    Section E:  Adverse Events (N/A for Implant Interval)  Have any new adverse events or updates to existing events occurred since last visit? [x]   Yes (complete/update AE form)   []   No  Section F:  Medication Changes   Have there been any changes to the subject's home use medications for Arrhythmia, Antiplatelet/Anticoagulation and Heart failure medications since the last visit? []  Yes (update Med form)   [x]  No  Section G:  Comments     n/a    Section H:  Signature    Person  completing form (Print Name): Mercer Pod_Kimberly Haedyn Ancrum :) ______________  Signature: Mercer Pod_Kimberly Nalaya Wojdyla :) ____ Date: _05/05/2022______  Updated subject number KL :) 05/09/2021   Section A:  Administrative Section  Subject ID: _[id number]__[id number]_ - _012_ - 015 Subject Initials: _W_ _-_ _L_  Visit Interval:    []  Screening/Baseline    []  Activation   []  0.5 Month       []  1 Month     []  2 Month     []  3 Month       [x]  6 Month     []  12 Month         []  Unscheduled, reason for visit: _________________________________________  Section B:  Physical Assessment   Date:    _05_/_May_/_2022_   (DD / MMM / Annamarie MajorYYYY)  Weight: _213_  []  kg    [x]  pounds Height (Screening Visit Only): ________  []  cm []  inches  Blood Pressure: _115_  / _92_mmHg Heart Rate: _74_ bpm  Section E:  Signature     Person completing form (Print Name): Mercer Pod_Kimberly Matheus Spiker :) ________  Signature: Mercer Pod_Kimberly Tavi Hoogendoorn :) _________  Date: 05/05/2022___________________     SECTION A:  Administrative Section  Patient : _[id number]_: _1245_ - _012_ - 015 Patient Initials: _W_ _-_ _L_  Visit Interval:    []  Screening/Baseline*    []  Activation   []  0.5 Month       []  1 Month     []  2 Month     []  3 Month       [x]  6 Month     []  12 Month         []  Unscheduled, reason for visit: _________________________________________  * For Screening / Baseline only the questions are based on 30 days prior to consent.  SECTION B:  COVID-19 Like Illness Symptoms   Has the subject experienced any cold, flu or COVID-19 symptoms since the last study visit? [x]  No  (skip to section C)     []  Yes, date of onset:  _____/_____ MMM/YYYY    If yes, check all symptoms that apply:   []  Fevers or chills   []  New or Worsening Cough  []  Productive  []  Dry    []  If yes to cough, indicate severity  []  Constant  []  Occasional, several per hour   []  New or worsened shortness of breath   []  Diarrhea   []   Altered or reduced sense of smell or taste   []  Muscle aches/Severe Fatigue    []  Chest pain or tightness   []  Sore throat   []  Nausea or vomiting  SECTION C:  COVID-19 Like Illness Testing   Has the patient been tested for COVID-19 since the last study visit? [x]  No     []  Yes, date of test:  _____/_____ MMM/YYYY    If yes, test results:   []  Positive []  Negative []  Unknown  Has the patient been tested for COVID-19 Antibodies since the last study visit? [x]  No     []  Yes, date of test:  _____/_____ MMM/YYYY    If yes, test results:   []  Positive []  Negative []  Unknown  Has the patient been vaccinated for COVID-19 since the last study visit? [x]  No     []  Yes, date of test:  _____/_____ MMM/YYYY  Has the patient been tested for Influenza ("flu") since the last study visit? [x]  No     []  Yes, date of test:  _____/_____ MMM/YYYY    If yes, test results:   []  Positive []  Negative []  Unknown  Has the patient been vaccinated for Influenza ("flu") since the last study visit? [x]  No     []  Yes, date of test:  _____/_____ MMM/YYYY  SECTION D:  COVID-19 Like Illness Exposure  Has the subject been told that they might have had COVID-19/have symptoms suggestive of COVID-19 since the last study visit? [x]  No   []  Yes   []  Unknown  Has the subject been exposed to anyone with known or suspected COVID-19 since the last study visit? [x]  No   []  Yes   []  Unknown  Has the subject been told that they might have had the "flu" or influenza since the last study visit? [x]  No   []  Yes   []  Unknown  SECTION E:  Effects of COVID Pandemic on Subject's Healthcare Interactions  Since the last study visit, did the subject feel the need to go to an emergency department or hospital for their heart failure but decided not to because of concerns about COVID-19? [x]  No   []  Yes   []  Unknown    If yes, how did they seek care (check all that apply):   []  Telemedicine visit []  In-person []  Clinic []  Urgent Care   []  Subject did not change hospital/ER use due to COVID-19 []  Other,  specify: ________________________  Since the last study visit, did the subject have a cardiology/HF related appointment cancelled/rescheduled due to COVID-19 pandemic? [x]  No   []  Yes, how many? ___________   []  Unknown  Since the last study visit, did the subject have any cardiology/HF related telemedicine visit due to COVID-19 pandemic? [x]  No   []  Yes, how many? ___________   []  Unknown  Since the last study visit, did the subject have a cardiology/HF procedure cancelled/rescheduled due to COVID-19 pandemic? [x]  No   []  Yes, how many? ___________   []  Unknown  SECTION F:  Effects of COVID Pandemic on Subject's Medications  Since the last visit, did any of their heart failure medications change or stop, even for a short time?   If yes, enter change into medication eCRF [x]  No   []  Yes, how many? ___________   []  Unknown    If yes, why:   []  Instructed by Doctor []  Self-discontinued []  Unknown  Other: ________________  Since the last visit, was the subject prescribed any medications for COVID-19? [  x] No   []  Yes   []  Unknown    If yes, what medications (generic name):  SECTION G:  Effects of COVID Pandemic on Subject's Lifestyle  How has the subject's activity/exercise level changed due to COVID-19 pandemic? [x]  No change   []  More activity/exercise   []  Less activity/exercise  How has the subject's smoking habits changed due to COVID-19? []  Does not smoke   [x]  No change   []  Smoke more   []  Smoke less  How has the subject's alcohol drinking habits changed due to COVID-19? []  Does not drink   [x]  No change   []  Drink more   []  Drink less  Section H:  Signature    Person completing form (Print Name): :) ___________________  Signature: :)__________ Date: _05/05/2022___________________    Six Minute Hall Walk Test Date:    _05_/_MAY_/_2022_  (DD / MMM / )  Distance Walked _480_ meters  Were there any devices used to assist the  subject in walking (e.g. cane, walker, etc.)? [x]  No   []  Yes specify:_______________________  Was the walk terminated before 6 minutes? [x]  No   []  Yes specify reason: (select all that apply)    []  Angina    []   Dyspnea    []   Fatigue    []   Dizziness    []   Syncope    []   Other, specify:  Name of person conducting 6-Minute Walk: :) _______________   Page 1 of 1     Confidential   Form Protocol [phone] Rev. B dated 08-Jul-2019   Effective Date: 20-Aug-2019  Subject ID: _[id number]_ - _012_ - 015 Subject Initials: _W_ _ _-_ _L_ Visit Interval:  []   Baseline     [x]  6 Month  Section B:  NYHA   NYHA Classification Date:    _05_/_MAY_/_2022_  (DD / MMM / )  Select One Class Subject Symptoms  []  I No limitations of physical activity, No undue fatigue, palpitation or dyspnea  [x]  II Slight limitation of physical activity, Comfortable at rest, Less than ordinary activity results in fatigue, Palpitation, or dyspnea  []  III Marked limitation of physical activity, Comfortable at rest, Less than ordinary activity results in fatigue, palpitation, or dyspnea  []  IV Unable to carry out any physical activity without discomfort, Symptoms of cardiac insufficiency at rest, Physical activity causes increased discomfort  Name of person conducting NYHA: Mercer Pod :) ________________   Patient ID: _[id number]_ - _012_ - 015 Patient Initials: _W_ _-_ _L_ Visit Interval:  []   Baseline     [x]  6 Month  Section D:  MLWHF   [place] Living With Heart Failure Questionnaire Date:    _05_/_MAY_/_2022______  (DD / MMM / YYYY)  These questions concern how your heart failure (heart condition) has prevented you from living as you wanted during the last month.  These items listed below describe different ways some people are affected.  If you are sure an item does not apply to you or is not related to your heart failure then circle 0 (no) and go on to the next item.  If an item does  apply to you, then circle the number rating how much it prevented you from living as you wanted.  Did your heart failure prevent you from living as you wanted during the last month by:   No Very little    Very much  1. Causing swelling in your ankles, legs, etc.? 0[x]   1[]  2[]  3[]  4[]  5[]   2. Making you sit or lie down to rest during the day? 0[]  1[x]  2[]  3[]  4[]  5[]   3. Making your walking about or climbing stairs difficult? 0[]  1[x]  2[]  3[]  4[]  5[]   4. Making your working around the house or yard difficult? 0[]  1[]  2[x]  3[]  4[]  5[]   5. Making your going places away from home difficult? 0[x]  1[]  2[]  3[]  4[]  5[]   6. Making your sleeping well at night difficult? 0[]  1[x]  2[]  3[]  4[]  5[]   7. Making your relating to or doing things with your friends or family difficult? 0[]  1[x]  2[]   3[]  4[]  5[]   8. Making your working to earn a living difficult? 0[]  1[]  2[]  3[]  4[]  5[x]   9. Making your recreational pastimes, sports or hobbies difficult? 0[]  1[x]  2[]  3[]  4[]  5[]   10. Making your sexual activities difficult? 0[x]  1[]  2[]  3[]  4[]  5[]   11. Making you eat less of the foods you like? 0[]  1[x]  2[]  3[]  4[]  5[]   12. Making you short of breath? 0[]  1[x]  2[]  3[]  4[]  5[]   13. Making you tired, fatigued, or low on energy? 0[]  1[]  2[x]  3[]  4[]  5[]   14. Making you stay in a hospital? 0[x]  1[]  2[]  3[]  4[]  5[]   15. Costing you money for medical care? 0[x]  1[]  2[]  3[]  4[]  5[]   16. Giving you side effects from medications? 0[x]  1[]  2[]  3[]  4[]  5[]   17. Making you feel you are a burden to your family or friends? 0[]  1[]  2[x]  3[]  4[]  5[]   18. Making you feel a loss of self-control in your life? 0[]  1[x]  2[]  3[]  4[]  5[]   19. Making you worry? 0[]  1[]  2[]  3[]  4[x]  5[]   20. Making it difficult for you to concentrate or remember things? 0[x]  1[]  2[]  3[]  4[]  5[]   21 Making you feel depressed? 0[]  1[x]  2[]  3[]  4[]  5[]   Section E:  Signature Section  Person Administering Questionnaire Name: (person who read first  question and handed questionnaire to subject) Mercer Pod :) ____ (Print) 02/02/2021   __Kimberly Ivory Broad :) _______ (Sign) (Date)  Person Completing Questionnaire Name: (e.g. subject, person reading questionnaire, etc.) Mercer Pod :) ______ (Print) 02/02/2021   Mercer Pod :) _____ (Sign) (Date)

## 2021-02-14 LAB — ECHOCARDIOGRAM COMPLETE
Area-P 1/2: 3.27 cm2
Calc EF: 38.8 %
S' Lateral: 3.1 cm
Single Plane A2C EF: 44.7 %
Single Plane A4C EF: 43.1 %

## 2021-02-15 NOTE — Progress Notes (Signed)
[facility] 34M visit  Patient is aware of results

## 2021-03-02 ENCOUNTER — Other Ambulatory Visit: Payer: Self-pay | Admitting: Cardiology

## 2021-03-16 ENCOUNTER — Ambulatory Visit (INDEPENDENT_AMBULATORY_CARE_PROVIDER_SITE_OTHER): Payer: BC Managed Care – PPO

## 2021-03-16 DIAGNOSIS — I255 Ischemic cardiomyopathy: Secondary | ICD-10-CM

## 2021-03-16 LAB — CUP PACEART REMOTE DEVICE CHECK
Battery Remaining Longevity: 106 mo
Battery Remaining Percentage: 86 %
Battery Voltage: 2.99 V
Brady Statistic RV Percent Paced: 1 %
Date Time Interrogation Session: 20220616021653
HighPow Impedance: 79 Ohm
Implantable Lead Implant Date: 20210318
Implantable Lead Location: 753860
Implantable Pulse Generator Implant Date: 20210318
Lead Channel Impedance Value: 460 Ohm
Lead Channel Pacing Threshold Amplitude: 0.75 V
Lead Channel Pacing Threshold Pulse Width: 0.5 ms
Lead Channel Sensing Intrinsic Amplitude: 12 mV
Lead Channel Setting Pacing Amplitude: 2.5 V
Lead Channel Setting Pacing Pulse Width: 0.5 ms
Lead Channel Setting Sensing Sensitivity: 0.5 mV
Pulse Gen Serial Number: 111013899

## 2021-04-06 NOTE — Progress Notes (Signed)
Remote ICD transmission.   

## 2021-05-09 ENCOUNTER — Encounter: Payer: BC Managed Care – PPO | Admitting: *Deleted

## 2021-05-09 VITALS — BP 104/80 | HR 66 | Wt 216.0 lb

## 2021-05-09 DIAGNOSIS — Z006 Encounter for examination for normal comparison and control in clinical research program: Secondary | ICD-10-CM

## 2021-05-19 NOTE — Research (Signed)
Section A:  Administrative Section   Subject ID: _[id number]_ - __012_____ - 015  Subject Initials: __W___ __-___ _L____  Section B:  Carotid Duplex Ultrasound (CDU) []  Not Done []  Not Done  Date: __12___/_OCT______/__2021_____ (DD / MMM / )  [x]  Images sent to [facility]  % atherosclerosis in the internal and distal common carotid and duplex measurements:  Right Side Measurements    Both internal and distal common carotids are less than 30% [x] Yes   []   No (cannot be used for implant)   Comments on determination of <30% stenosis:  Internal Carotid (ICA) []  Normal (no plaque) PSV (peak systolic velocity) ___76______ cm/sec   [x]  ?15% EDV (end diastolic velocity) ____29_____ cm/sec   []  16-49% (*see CTA) Linear diameter __0.52_______ mm   []  50-69% Normal spectral waveform  [x]  Yes   []  ?70%  []  No, specify pattern: __________________  Distal Common Carotid [facility]) []  Normal (no plaque) PSV (peak systolic velocity) __83_______ cm/sec   [x]  ?15% EDV (end diastolic velocity) ____18_____ cm/sec   []  16-49% (*see CTA) Linear diameter ____0_.84____ mm   []  50-69% Normal spectral waveform  [x]  Yes   []  ?70%  []  No, specify pattern: __________________  Left Side Measurements   Both internal and distal common carotids are less than 30% [x] Yes       [] No (cannot be used for implant)   Comments on determination of <30% stenosis:  Internal Carotid (ICA) []  Stenosis Normal (no plaque) PSV (peak systolic velocity) __66_______ cm/sec   [x]  ?15%  EDV (end diastolic velocity) __25_______ cm/sec   []  16-49% (*see CTA) Linear diameter ___0.78______ mm   []  50-69%  Normal spectral waveform  [x]  Yes   []  ?70%   []  No, specify pattern: __________________  Distal Common Carotid [facility])  []  Stenosis Normal (no plaque) PSV (peak systolic velocity) __79_______ cm/sec   [x]  ?15%  EDV (end diastolic velocity) __23_______ cm/sec   []  16-49% (*see CTA) Linear diameter __0.9_______ mm   []  50-69%  Normal spectral  waveform  [x]  Yes   []  ?70%   []  No, specify pattern: __________________  Name of person conducting CDU: Name of person reading CDU:  , MD   Section C:  Computed Tomography Angiogram (CTA) (if required) [] Not Done [] Not Done  []  CDU was inconclusive (e.g. *stenosis 16-49%) Date: __25___/_OCT_/_2021_  (DD / MMM / )  [x] Images sent to [facility]  []  CDU was incomplete/not done    [x]  CTA requested by [facility]    % atherosclerosis Internal Carotid (ICA) Distal Common Carotid [facility])  Right Side _0______% __0_____%  Left Side __0_____% ___0____%  Name of person conducting CTA: Name of person reading CTA: , MD    Section D:  Physician Assessment  Are there any vascular structures or orientations or neck anomalies that would be obstructive to the implantation path? [x] No [] Yes (Specify side):     []  Left   []  Right  []  Both  Has the subject had prior surgery, radiation, or endovascular stent placement in the carotid artery or the carotid sinus region? [x]  No []  Yes (Specify side):     []  Left   []  Right   []  Both  Is at least one carotid bifurcation below the level of the mandible? []  No [x]  Yes (Specify side):     []  Left   []  Right   [x]  Both  Are there any ulcerative carotid arterial plaques? [x]  No []  Yes (Specify side):     []   Left   []  Right   []  Both  Is this subject a candidate for the [facility] procedure? []  No [x]  Yes (Specify side):     []  Left   []  Right   [x]  Both  Section E:  Comments  N/a        Section F:  Signature   Person completing form (Print Name): ___Kimberly :) ________ Signature: :) __ Date: __08/19/2022_____________

## 2021-05-22 NOTE — Research (Signed)
Batwire unscheduled visit   Patient called coordinator to report that he was feeling a something in his neck.  So he came in to have his device checked.  Device was titrated and patient felt much better.   Section A:  Administrative Section  Subject ID: _[id number] - _012_ - 015 Subject Initials: _W_ _-_ _L_  Visit Interval:    []  Screening/Baseline    []  Activation   []  0.5 Month       []  1 Month     []  2 Month     []  3 Month       []  6 Month     []  12 Month         [x]  Unscheduled, reason for visit:  _felt a little something in his neck   Section B:  Physical Assessment   Date:    _09_/_Aug_/_2022_   (DD / MMM / )  Weight: _216_  []  kg    [x]  pounds Height (Screening Visit Only): ________  []  cm []  inches  Blood Pressure: _104_  / _80_mmHg Heart Rate: __66__ bpm  Section E:  Signature    Person completing form (Print Name): :)_____________  Signature: :) _____ Date: ___08/22/2022_______________________       Section A:  Administrative Section  Subject ID: __[id number]__ - _012_ - 015 Subject Initials: _W_ _-_ _L_  Visit Interval:    []  Screening/Baseline    []  Activation   []  0.5 Month       []  1 Month     []  2 Month     []  3 Month       []  6 Month     []  12 Month         [x]  Unscheduled, reason for visit: _felt something in his neck _______  Section B:  Device Information   Battery Battery Voltage: 2.96 V   Battery Life: 41 Months   RRT Date: 28-Sep-2024 (DD/MMM/YYYY)  Lead Impeadance Right Lead __940__ Ohms    []  Low    []  High   Left Lead _________ Ohms    []  Low    [x]  High  Programmed Settings Pathway Pulse Width Amplitude Frequency   []  Left 125 6.8 40   [x]  Right     Section C:  Programming Information (12 Month and Unscheduled - not required)  Date: _09_/_AUG_/_2022____     (DD / MMM / ) Device information, therapy schedule and programmed settings can be found on the session summary report  Site Clinician Present:  __Kimberly Taha Dimond :) ______________________  helping with programming: __Dexter O'Steen______ []  N/A  Location of CVRx person: [x]  []  Onsite Remote  Subject Experience   1. Did the subject experience transient bradycardia or hypotension during device testing? []  [x]  Yes No   2. Did the subject experience transient electrical stimulation of non-vascular tissues? []  [x]  Yes No   3. If yes to either question above, was intervention beyond reprogramming needed or was it associated with an additional untoward event? []  []  Yes No   4a. Is there any indication that there has been unacceptable device interaction between the CVRx device and other implanted electrical stimulators or sensors device? []  [x]  []  Yes No N/A (no other device)   4b.                   Section D:  Arrythmia Interventions  Has the subject received a cardiac ablation since the last study visit? []   Yes  Date of last procedure:   ____/____/_____ (DD/MMM/YYYY)   [x]  No   []  Unknown   If yes, what type and # of ablations Type []  []  Atrial     []  []  Ventricular    # of procedures: ____________   Was this pre-planned prior to CVRx implant? []  Yes    []  No (complete/update AE form)  Has the subject received a cardioversion since the last study visit? []  Yes  Date of last procedure:   ____/____/_____ (DD/MMM/YYYY)   [x]  No   []  Unknown   If yes, what type and # of cardioversions? Type []  Medications     []  Electrical Shock    # of procedures: _____________   Was this pre-planned prior to CVRx implant? []  Yes    []   No (complete/update AE form)    Section E:  Adverse Events (N/A for Implant Interval)  Have any new adverse events or updates to existing events occurred since last visit? []   Yes (complete/update AE form)   [x]   No  Section F:  Medication Changes   Have there been any changes to the subject's home use medications for Arrhythmia, Antiplatelet/Anticoagulation and Heart failure  medications since the last visit? []  Yes (update Med form)   [x]  No  Section G:  Comments     N/A                Section H:  Signature   Person completing form (Print Name): :) ______________  Signature: :) ______ Date: _08/22/2022_______________

## 2021-06-01 ENCOUNTER — Other Ambulatory Visit: Payer: Self-pay | Admitting: Internal Medicine

## 2021-06-03 ENCOUNTER — Other Ambulatory Visit: Payer: Self-pay | Admitting: Internal Medicine

## 2021-06-15 ENCOUNTER — Ambulatory Visit (INDEPENDENT_AMBULATORY_CARE_PROVIDER_SITE_OTHER): Payer: BC Managed Care – PPO

## 2021-06-15 DIAGNOSIS — I255 Ischemic cardiomyopathy: Secondary | ICD-10-CM

## 2021-06-17 LAB — CUP PACEART REMOTE DEVICE CHECK
Battery Remaining Longevity: 103 mo
Battery Remaining Percentage: 83 %
Battery Voltage: 2.99 V
Brady Statistic RV Percent Paced: 1 %
Date Time Interrogation Session: 20220915024618
HighPow Impedance: 91 Ohm
Implantable Lead Implant Date: 20210318
Implantable Lead Location: 753860
Implantable Pulse Generator Implant Date: 20210318
Lead Channel Impedance Value: 460 Ohm
Lead Channel Pacing Threshold Amplitude: 0.75 V
Lead Channel Pacing Threshold Pulse Width: 0.5 ms
Lead Channel Sensing Intrinsic Amplitude: 12 mV
Lead Channel Setting Pacing Amplitude: 2.5 V
Lead Channel Setting Pacing Pulse Width: 0.5 ms
Lead Channel Setting Sensing Sensitivity: 0.5 mV
Pulse Gen Serial Number: 111013899

## 2021-06-19 IMAGING — CT CT ANGIO NECK
2 of 7 series · 8 of 33 positions shown · IV contrast (APPLIED)
Comparison: None.

CLINICAL DATA: Pre batwire implant

EXAM:
CT ANGIOGRAPHY NECK
TECHNIQUE: Multidetector CT imaging of the neck was performed using the
standard protocol during bolus administration of intravenous
contrast. Multiplanar CT image reconstructions and MIPs were
obtained to evaluate the vascular anatomy. Carotid stenosis
measurements (when applicable) are obtained utilizing NASCET
criteria, using the distal internal carotid diameter as the
denominator.
CONTRAST:  100mL OMNIPAQUE IOHEXOL 350 MG/ML SOLN

[Series 6: cta neck · axial · 0.55mm/px · z∈[-211,-127]mm · 2 of 127 slices shown]
[im 43/127  soft-tissue]
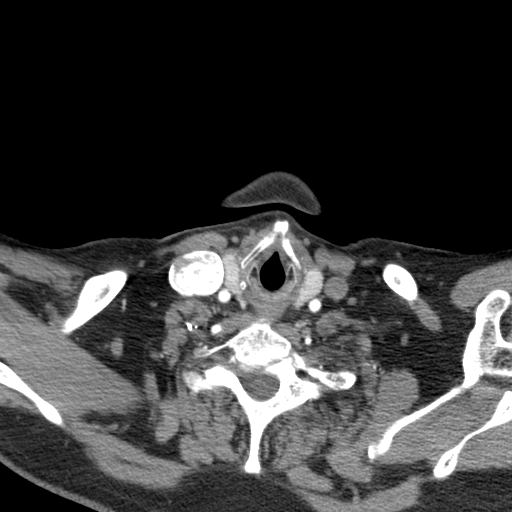
[im 85/127  soft-tissue]
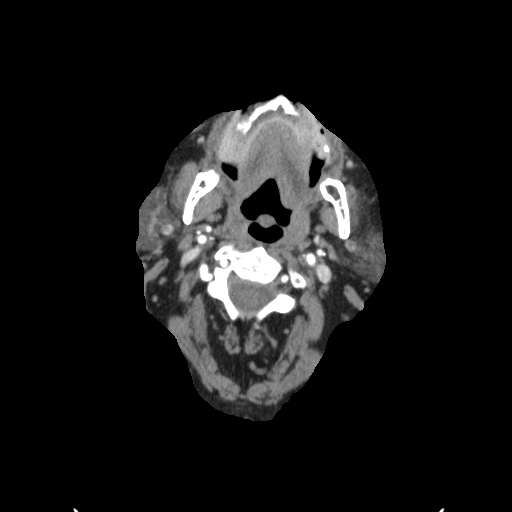

[Series 8: ax thin · axial · 0.44mm/px · z∈[-258,-78]mm · 6 of 253 slices shown]
[im 37/253  soft-tissue]
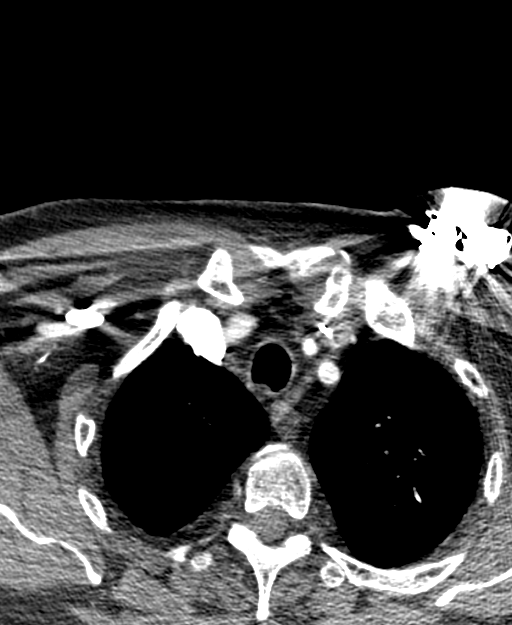
[im 73/253  bone]
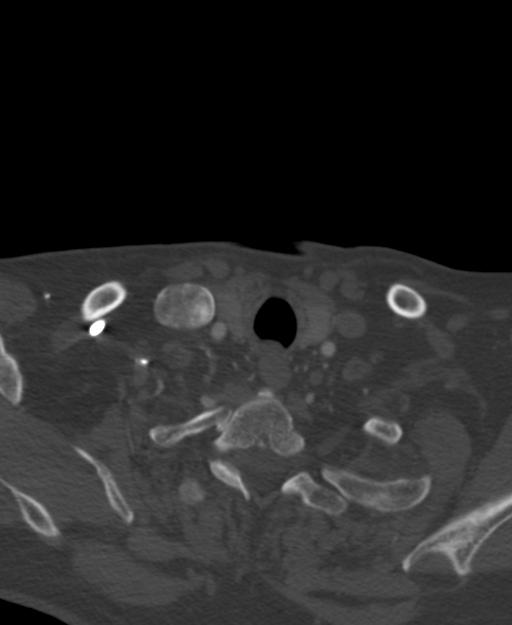
[im 109/253  soft-tissue]
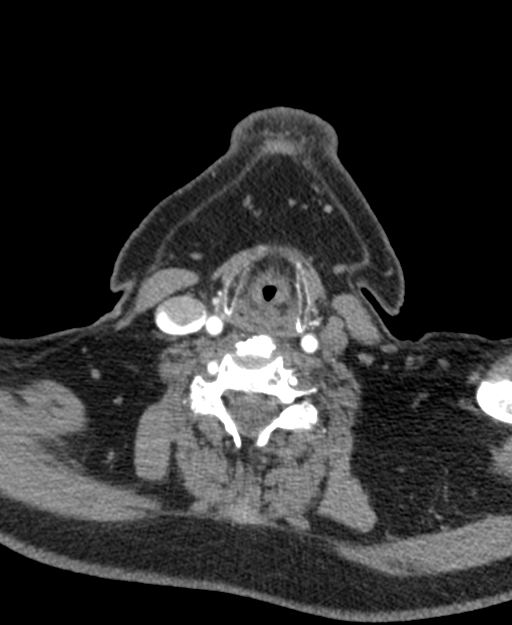
[im 145/253  bone]
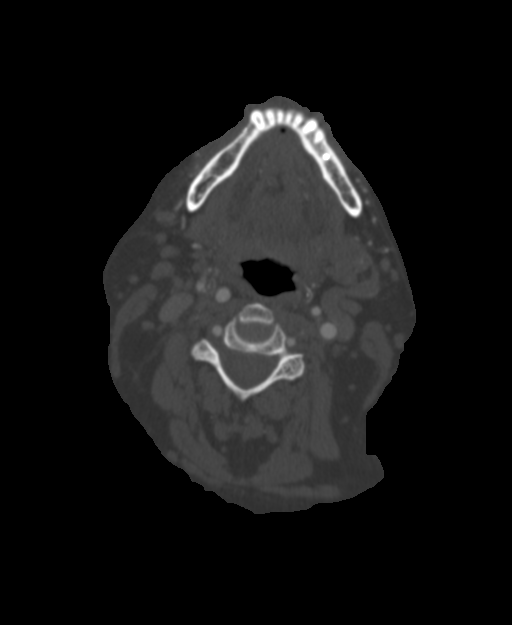
[im 181/253  soft-tissue]
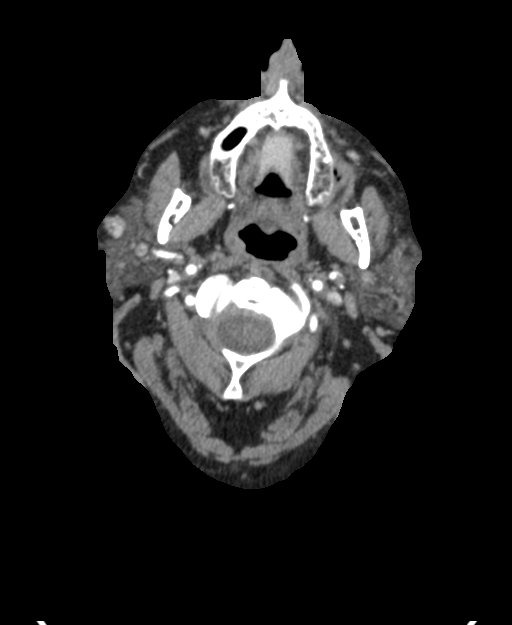
[im 217/253  bone]
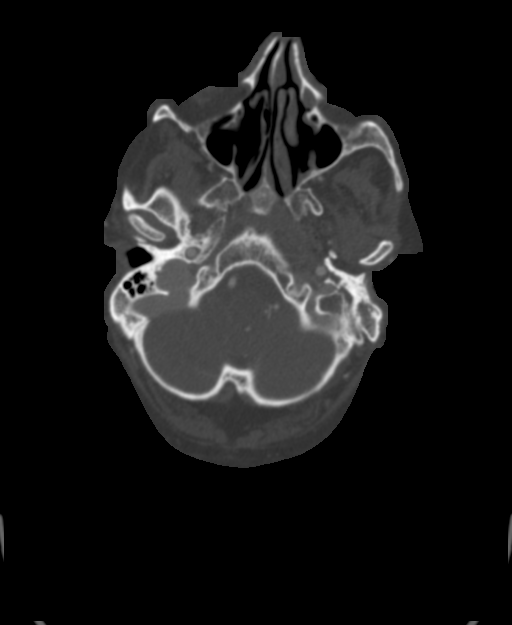

[8 of 33 positions shown; findings below may reference images not displayed]

FINDINGS: Aortic arch: 3 vessel aortic arch.  Great vessel origins are patent.

Right carotid system: Patent. No significant plaque at the common
carotid bifurcation. No measurable stenosis.

Left carotid system: Patent. Trace calcified plaque at the common
carotid bifurcation. No measurable stenosis.

Vertebral arteries: Patent. Right vertebral artery is dominant. No
measurable stenosis.

Skeleton: Degenerative changes of the cervical spine, greatest at
C6-C7 and C5-C6.

Other neck: No mass or adenopathy.

Upper chest: Partially imaged left chest wall single lead pacemaker.
IMPRESSION: No significant carotid plaque burden or measurable stenosis.

## 2021-06-20 ENCOUNTER — Other Ambulatory Visit (HOSPITAL_COMMUNITY): Payer: Self-pay | Admitting: Internal Medicine

## 2021-06-20 ENCOUNTER — Other Ambulatory Visit: Payer: Self-pay | Admitting: *Deleted

## 2021-06-20 ENCOUNTER — Other Ambulatory Visit: Payer: Self-pay | Admitting: Internal Medicine

## 2021-06-20 DIAGNOSIS — Z006 Encounter for examination for normal comparison and control in clinical research program: Secondary | ICD-10-CM

## 2021-06-22 NOTE — Progress Notes (Signed)
Remote ICD transmission.   

## 2021-06-27 IMAGING — DX DG CHEST 1V PORT
1 series · 1 of 1 positions shown · non-contrast
Comparison: 08/29/2015.  07/03/2014.

CLINICAL DATA: CHF.

EXAM:
PORTABLE CHEST 1 VIEW

[chest ap]
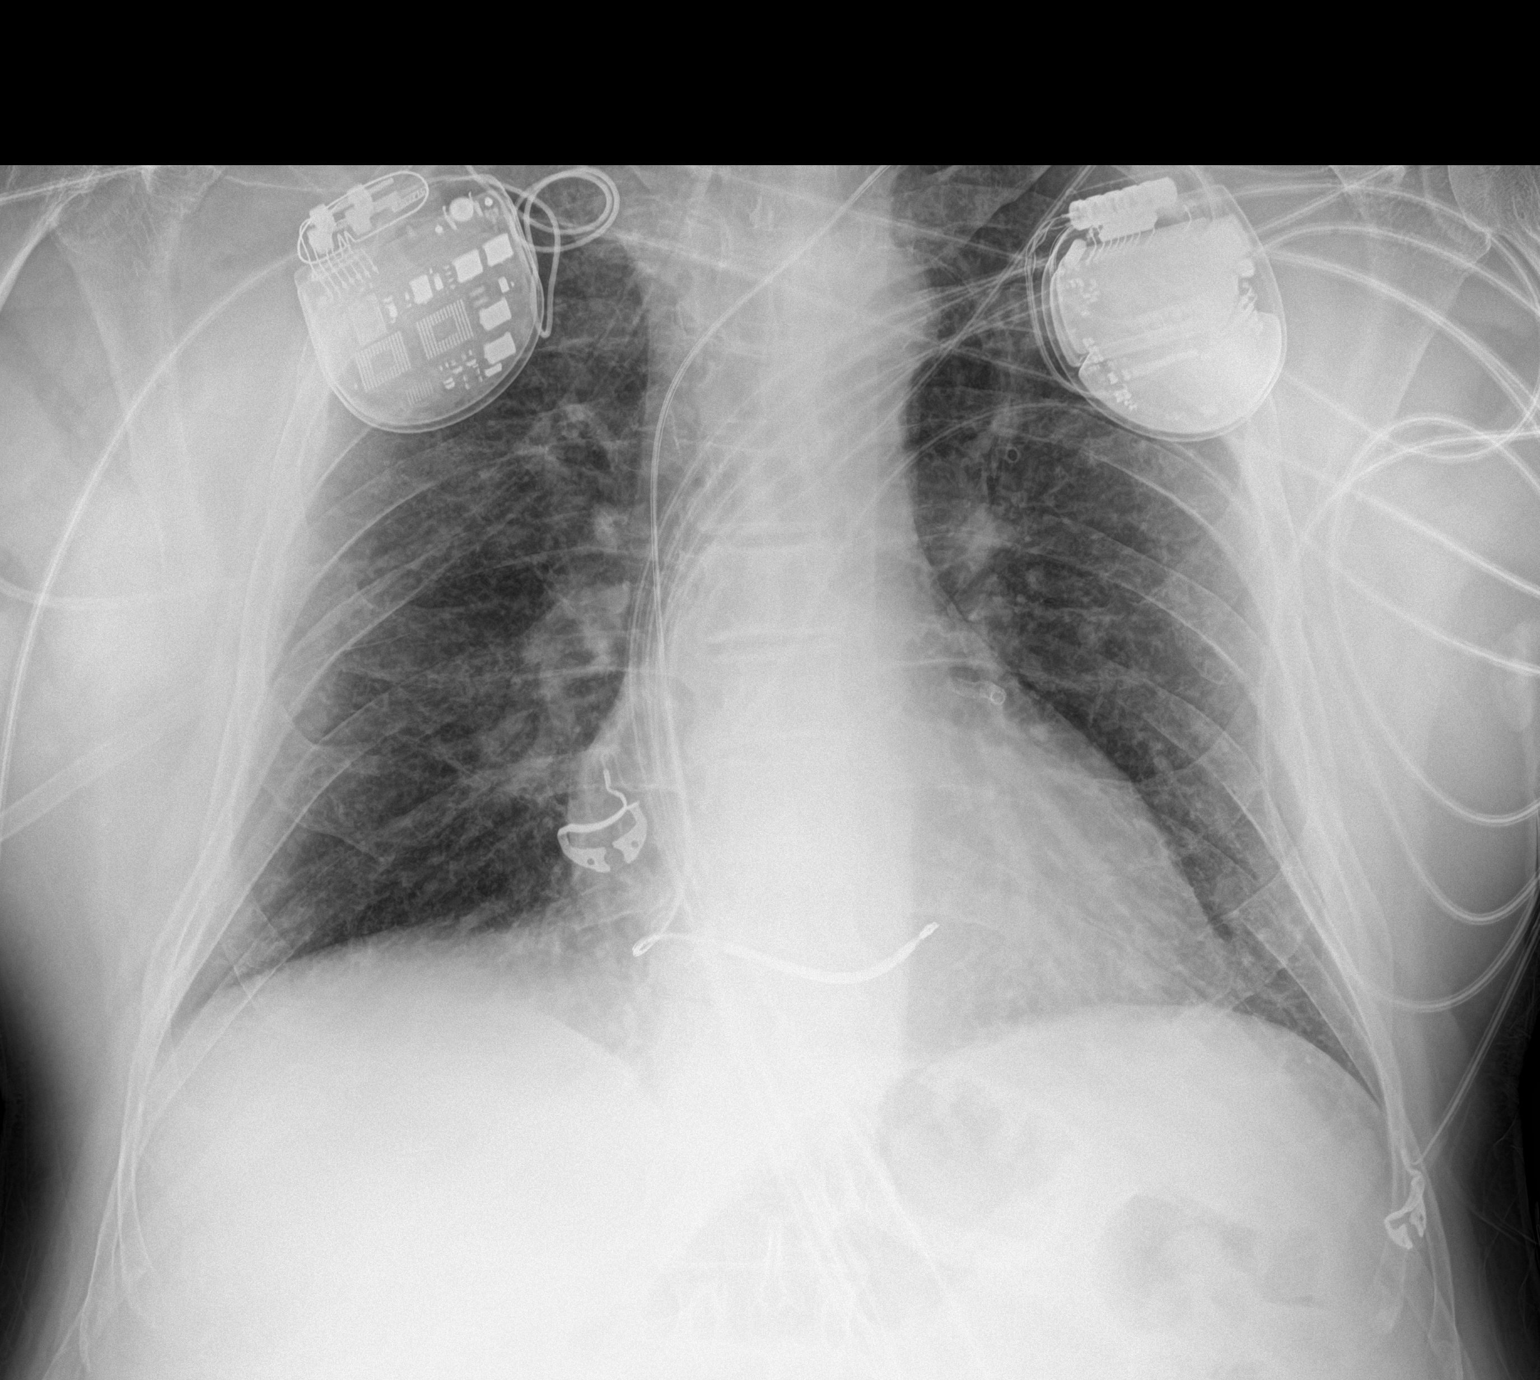

[1 of 1 positions shown; findings below may reference images not displayed]

FINDINGS: Left upper AICD noted with lead tip over the right ventricle.
Stimulator device noted over the right upper chest with lead tip
coursing up the right neck. Borderline cardiomegaly, no pulmonary
venous congestion. Stable mild bilateral interstitial prominence,
this may be chronic. No pleural effusion or pneumothorax. No acute
bony abnormality.
IMPRESSION: 1. Left upper AICD noted with lead tip over the right ventricle.
Stimulator device noted over the right upper chest with lead tip
coursing up the right neck.
2. Borderline cardiomegaly.
3. Stable mild bilateral interstitial prominence. This may be
chronic.

## 2021-08-08 ENCOUNTER — Encounter: Payer: BC Managed Care – PPO | Admitting: *Deleted

## 2021-08-08 ENCOUNTER — Other Ambulatory Visit: Payer: Self-pay

## 2021-08-08 ENCOUNTER — Ambulatory Visit (HOSPITAL_BASED_OUTPATIENT_CLINIC_OR_DEPARTMENT_OTHER)
Admission: RE | Admit: 2021-08-08 | Discharge: 2021-08-08 | Disposition: A | Discharge status: Home / Self Care | Payer: BC Managed Care – PPO | Source: Ambulatory Visit | Attending: Surgery | Admitting: Surgery

## 2021-08-08 ENCOUNTER — Ambulatory Visit (HOSPITAL_COMMUNITY)
Admission: RE | Admit: 2021-08-08 | Discharge: 2021-08-08 | Disposition: A | Discharge status: Home / Self Care | Payer: Self-pay | Source: Ambulatory Visit | Attending: Surgery | Admitting: Surgery

## 2021-08-08 ENCOUNTER — Ambulatory Visit (HOSPITAL_COMMUNITY)
Admission: RE | Admit: 2021-08-08 | Discharge: 2021-08-08 | Disposition: A | Discharge status: Home / Self Care | Payer: BC Managed Care – PPO | Source: Ambulatory Visit | Attending: Surgery | Admitting: Surgery

## 2021-08-08 VITALS — BP 115/84 | HR 65 | Wt 208.0 lb

## 2021-08-08 DIAGNOSIS — Z006 Encounter for examination for normal comparison and control in clinical research program: Secondary | ICD-10-CM

## 2021-08-08 LAB — POCT I-STAT CREATININE: Creatinine, Ser: 1.1 mg/dL (ref 0.61–1.24)

## 2021-08-08 MED ORDER — IOHEXOL 350 MG/ML SOLN
50.0000 mL | Freq: Once | INTRAVENOUS | Status: AC | PRN
  Administered 2021-08-08: 50 mL via INTRAVENOUS

## 2021-08-08 NOTE — Progress Notes (Signed)
  Echocardiogram 2D Echocardiogram has been performed.  Alan White 08/08/2021, 4:32 PM

## 2021-08-09 LAB — ECHOCARDIOGRAM COMPLETE
AR max vel: 3.26 cm2
AV Area VTI: 3.4 cm2
AV Area mean vel: 3.22 cm2
AV Mean grad: 2 mmHg
AV Peak grad: 4.4 mmHg
Ao pk vel: 1.05 m/s
Area-P 1/2: 3.93 cm2
Calc EF: 50.1 %
S' Lateral: 4.7 cm
Single Plane A2C EF: 56.9 %
Single Plane A4C EF: 44 %
Weight: 3328 oz

## 2021-08-10 NOTE — Progress Notes (Signed)
Batwire pt 312 1 year visit  Will give results to patient.

## 2021-08-10 NOTE — Progress Notes (Signed)
Batwire #12 1 year visit  Will let patient know results of carotid

## 2021-08-10 NOTE — Progress Notes (Signed)
Batwire #12 1 year visit

## 2021-08-15 ENCOUNTER — Other Ambulatory Visit: Payer: Self-pay

## 2021-08-15 ENCOUNTER — Emergency Department (HOSPITAL_BASED_OUTPATIENT_CLINIC_OR_DEPARTMENT_OTHER)
Admission: EM | Admit: 2021-08-15 | Discharge: 2021-08-15 | Disposition: A | Discharge status: Home / Self Care | Payer: BC Managed Care – PPO | Attending: Emergency Medicine | Admitting: Emergency Medicine

## 2021-08-15 ENCOUNTER — Emergency Department (HOSPITAL_BASED_OUTPATIENT_CLINIC_OR_DEPARTMENT_OTHER): Payer: BC Managed Care – PPO

## 2021-08-15 ENCOUNTER — Encounter (HOSPITAL_BASED_OUTPATIENT_CLINIC_OR_DEPARTMENT_OTHER): Payer: Self-pay | Admitting: Emergency Medicine

## 2021-08-15 DIAGNOSIS — R109 Unspecified abdominal pain: Secondary | ICD-10-CM | POA: Diagnosis present

## 2021-08-15 DIAGNOSIS — Z7982 Long term (current) use of aspirin: Secondary | ICD-10-CM | POA: Diagnosis not present

## 2021-08-15 DIAGNOSIS — K649 Unspecified hemorrhoids: Secondary | ICD-10-CM

## 2021-08-15 DIAGNOSIS — K8020 Calculus of gallbladder without cholecystitis without obstruction: Secondary | ICD-10-CM | POA: Insufficient documentation

## 2021-08-15 DIAGNOSIS — K648 Other hemorrhoids: Secondary | ICD-10-CM | POA: Diagnosis not present

## 2021-08-15 DIAGNOSIS — Z7902 Long term (current) use of antithrombotics/antiplatelets: Secondary | ICD-10-CM | POA: Diagnosis not present

## 2021-08-15 DIAGNOSIS — K219 Gastro-esophageal reflux disease without esophagitis: Secondary | ICD-10-CM | POA: Diagnosis not present

## 2021-08-15 DIAGNOSIS — I509 Heart failure, unspecified: Secondary | ICD-10-CM | POA: Diagnosis not present

## 2021-08-15 DIAGNOSIS — Z955 Presence of coronary angioplasty implant and graft: Secondary | ICD-10-CM | POA: Insufficient documentation

## 2021-08-15 DIAGNOSIS — I2510 Atherosclerotic heart disease of native coronary artery without angina pectoris: Secondary | ICD-10-CM | POA: Insufficient documentation

## 2021-08-15 DIAGNOSIS — F17210 Nicotine dependence, cigarettes, uncomplicated: Secondary | ICD-10-CM | POA: Insufficient documentation

## 2021-08-15 DIAGNOSIS — Z79899 Other long term (current) drug therapy: Secondary | ICD-10-CM | POA: Diagnosis not present

## 2021-08-15 DIAGNOSIS — K8050 Calculus of bile duct without cholangitis or cholecystitis without obstruction: Secondary | ICD-10-CM | POA: Diagnosis not present

## 2021-08-15 LAB — CBC
HCT: 44.1 % (ref 39.0–52.0)
Hemoglobin: 14.5 g/dL (ref 13.0–17.0)
MCH: 31 pg (ref 26.0–34.0)
MCHC: 32.9 g/dL (ref 30.0–36.0)
MCV: 94.4 fL (ref 80.0–100.0)
Platelets: 235 10*3/uL (ref 150–400)
RBC: 4.67 MIL/uL (ref 4.22–5.81)
RDW: 13.3 % (ref 11.5–15.5)
WBC: 9.7 10*3/uL (ref 4.0–10.5)
nRBC: 0 % (ref 0.0–0.2)

## 2021-08-15 LAB — COMPREHENSIVE METABOLIC PANEL
ALT: 10 U/L (ref 0–44)
AST: 11 U/L — ABNORMAL LOW (ref 15–41)
Albumin: 4.3 g/dL (ref 3.5–5.0)
Alkaline Phosphatase: 98 U/L (ref 38–126)
Anion gap: 7 (ref 5–15)
BUN: 15 mg/dL (ref 8–23)
CO2: 31 mmol/L (ref 22–32)
Calcium: 10 mg/dL (ref 8.9–10.3)
Chloride: 102 mmol/L (ref 98–111)
Creatinine, Ser: 1.15 mg/dL (ref 0.61–1.24)
GFR, Estimated: >60 mL/min (ref >60)
Glucose, Bld: 97 mg/dL (ref 70–99)
Potassium: 4.2 mmol/L (ref 3.5–5.1)
Sodium: 140 mmol/L (ref 135–145)
Total Bilirubin: 0.6 mg/dL (ref 0.3–1.2)
Total Protein: 7.2 g/dL (ref 6.5–8.1)

## 2021-08-15 LAB — LIPASE, BLOOD: Lipase: 19 U/L (ref 11–51)

## 2021-08-15 NOTE — ED Provider Notes (Signed)
[facility] EMERGENCY DEPARTMENT Provider Note  CSN: [id number] Arrival date & time: 08/15/21 1032    History Chief Complaint  Patient presents with   Abdominal Pain    Alan White is a 60 y.o. male with history of CAD, CHF, AICD reports 2 years of intermittent R sided abdominal pain. He had an outpatient CT done in Jun 2021 showing chronic cholecystitis and cholelithiasis, but did not follow up with surgery or seek further care for that problem. He reports pain has worsened in the last few weeks, comes and goes, usually starts a couple of hours after he eats. He has had some nausea, belching and dry heaves. No vomiting, no change in stool color. No melena. He also reports a 'pimple' near his rectum that comes and goes, sometimes 'pops and drains blood'. He denies fever but reports he feels cold all the time.    Past Medical History:  Diagnosis Date   AICD (automatic cardioverter/defibrillator) present    [employer]) [employer] ICD   CAD (coronary artery disease), 12/29/13 PCI/DES LCX and PCI/DES LAD with overlapping DES  12/30/2013   cath 07/05/14 [place]   CHF (congestive heart failure) (HCC)    Dyslipidemia, goal LDL below 70 12/30/2013   GERD (gastroesophageal reflux disease)    Metabolic syndrome, HgbA1C 6.0  12/30/2013   Myocardial infarction (HCC) 2015,2020   Sleep apnea    by history - patient denies this dx as of 08/01/20   Smoker    Wears partial dentures    upper    Past Surgical History:  Procedure Laterality Date   BAROREFLEX SYSTEM INSERTION Right 08/02/2020   Procedure: BAROREFLEX SYSTEM INSERTION;  Surgeon: Hillis Range, MD;  Location: [facility] INVASIVE CV LAB;  Service: Cardiovascular;  Laterality: Right;   CARDIAC CATHETERIZATION  07/05/14,01/31/19   patent stents   CORONARY ANGIOPLASTY WITH STENT PLACEMENT  12/29/13   DES to LCX and overlapping stents DES mid LAD   CORONARY/GRAFT ACUTE MI REVASCULARIZATION N/A 01/31/2019   Procedure: Coronary/Graft Acute MI  Revascularization;  Surgeon: Kathleene Hazel, MD;  Location: [facility] INVASIVE CV LAB;  Service: Cardiovascular;  Laterality: N/A;   ICD IMPLANT N/A 12/17/2019   Procedure: ICD IMPLANT;  Surgeon: Regan Lemming, MD;  Location[facility] INVASIVE CV LAB;  Service: Cardiovascular;  Laterality: N/A;   LEFT HEART CATH AND CORONARY ANGIOGRAPHY N/A 01/31/2019   Procedure: LEFT HEART CATH AND CORONARY ANGIOGRAPHY;  Surgeon: Kathleene Hazel, MD;  Location: [facility] INVASIVE CV LAB;  Service: Cardiovascular;  Laterality: N/A;   LEFT HEART CATHETERIZATION WITH CORONARY ANGIOGRAM N/A 12/29/2013   Procedure: LEFT HEART CATHETERIZATION WITH CORONARY ANGIOGRAM;  Surgeon: [name], MD;  Location: [facility] CATH LAB;  Service: Cardiovascular;  Laterality: N/A;   LEFT HEART CATHETERIZATION WITH CORONARY ANGIOGRAM N/A 07/05/2014   Procedure: LEFT HEART CATHETERIZATION WITH CORONARY ANGIOGRAM;  Surgeon: Peter M Swaziland, MD;  Location: [facility] CATH LAB;  Service: Cardiovascular;  Laterality: N/A;   PERCUTANEOUS CORONARY STENT INTERVENTION (PCI-S)  12/29/2013   Procedure: PERCUTANEOUS CORONARY STENT INTERVENTION (PCI-S);  Surgeon: [name], MD;  Location: [facility] CATH LAB;  Service: Cardiovascular;;    Family History  Problem Relation Age of Onset   Heart attack Brother        Deceased   Heart attack Brother    Stroke Sister    Diabetes Father        also heart disease   Cancer Father        Deceased  Diabetes Mother    Cancer Mother        Deceased    Social History   Tobacco Use   Smoking status: Every Day    Packs/day: 0.50    Years: 40.00    Pack years: 20.00    Types: Cigarettes   Smokeless tobacco: Never  Vaping Use   Vaping Use: Never used  Substance Use Topics   Alcohol use: Yes    Comment: "Once in a blue moon"   Drug use: No     Home Medications Prior to Admission medications   Medication Sig Start Date End Date Taking? Authorizing Provider  aspirin EC 81 MG EC tablet Take 1  tablet (81 mg total) by mouth daily. 02/03/19  Yes Bhagat, Bhavinkumar, PA  atorvastatin (LIPITOR) 80 MG tablet TAKE 1 TABLET BY MOUTH EVERY DAY 06/01/21  Yes Runell Gess, MD  carvedilol (COREG) 3.125 MG tablet TAKE 1 TABLET BY MOUTH TWICE A DAY WITH A MEAL 09/22/20  Yes Hilty, Lisette Abu, MD  clopidogrel (PLAVIX) 75 MG tablet TAKE 1 TABLET BY MOUTH EVERY DAY 03/02/21  Yes Camnitz, Andree Coss, MD  ENTRESTO 24-26 MG TAKE 1 TABLET BY MOUTH TWICE A DAY 06/06/21  Yes Hilty, Lisette Abu, MD  isosorbide mononitrate (IMDUR) 30 MG 24 hr tablet 1/2 tablet in the morning 02/02/19  Yes [provider]  pantoprazole (PROTONIX) 40 MG tablet TAKE 1 TABLET BY MOUTH EVERY DAY 12/05/20  Yes Runell Gess, MD  spironolactone (ALDACTONE) 25 MG tablet Take 0.5 tablets (12.5 mg total) by mouth every other day. 09/06/20 08/15/21 Yes Hilty, Lisette Abu, MD  nitroGLYCERIN (NITROSTAT) 0.4 MG SL tablet Place 1 tablet (0.4 mg total) under the tongue every 5 (five) minutes x 3 doses as needed for chest pain. 09/06/20   Ronney Asters, NP     Allergies    Patient has no known allergies.   Review of Systems   Review of Systems A comprehensive review of systems was completed and negative except as noted in HPI.    Physical Exam BP 116/77 (BP Location: Right Arm)   Pulse 77   Temp 98.4 F (36.9 C)   Resp 16   SpO2 97%   Physical Exam Vitals and nursing note reviewed.  Constitutional:      Appearance: Normal appearance.  HENT:     Head: Normocephalic and atraumatic.     Nose: Nose normal.     Mouth/Throat:     Mouth: Mucous membranes are moist.  Eyes:     Extraocular Movements: Extraocular movements intact.     Conjunctiva/sclera: Conjunctivae normal.  Cardiovascular:     Rate and Rhythm: Normal rate.  Pulmonary:     Effort: Pulmonary effort is normal.     Breath sounds: Normal breath sounds.  Abdominal:     General: Abdomen is flat.     Palpations: Abdomen is soft.     Tenderness: There is  abdominal tenderness in the right upper quadrant and right lower quadrant. There is no guarding. Negative signs include Murphy's sign and McBurney's sign.  Genitourinary:    Comments: Chaperone present, small non-thrombosed and non-bleeding hemorrhoid at 9 o'clock Musculoskeletal:        General: No swelling. Normal range of motion.     Cervical back: Neck supple.  Skin:    General: Skin is warm and dry.  Neurological:     General: No focal deficit present.     Mental Status: He is alert.  Psychiatric:        Mood and Affect: Mood normal.     ED Results / Procedures / Treatments   Labs (all labs ordered are listed, but only abnormal results are displayed) Labs Reviewed  COMPREHENSIVE METABOLIC PANEL - Abnormal; Notable for the following components:      Result Value   AST 11 (*)    All other components within normal limits  LIPASE, BLOOD  CBC  URINALYSIS, ROUTINE W REFLEX MICROSCOPIC    EKG None   Radiology US Abdomen Limited  Result Date: 08/15/2021 CLINICAL DATA:  Right upper quadrant pain for 6 months. EXAM: ULTRASOUND ABDOMEN LIMITED RIGHT UPPER QUADRANT COMPARISON:  CT abdomen pelvis dated March 29, 2020. FINDINGS: Gallbladder: Unchanged 2.4 cm gallstone in the gallbladder neck. Small amount of sludge noted. No wall thickening visualized. No sonographic Murphy sign noted by sonographer. Common bile duct: Diameter: 6 mm, normal. Liver: No focal lesion identified. Mildly increased in parenchymal echogenicity. Portal vein is patent on color Doppler imaging with normal direction of blood flow towards the liver. Other: None. IMPRESSION: 1. Cholelithiasis and sludge without sonographic evidence of acute cholecystitis. 2. Mildly increased hepatic parenchymal echogenicity, nonspecific, but suggestive of steatosis. Electronically Signed   By: Obie Dredge M.D.   On: 08/15/2021 13:04    Procedures Procedures  Medications Ordered in the ED Medications - No data to  display   MDM Rules/Calculators/A&P MDM Patient with chronic abdominal pain, possibly biliary colic, but did not go to see Gen Surg after CT done in 2021 showed gall stones. Labs done in triage including CBC, CMP and Lipase are normal. Will check [place]. Currently he is not in much pain.   ED Course  I have reviewed the triage vital signs and the nursing notes.  Pertinent labs & imaging results that were available during my care of the patient were reviewed by me and considered in my medical decision making (see chart for details).  Clinical Course as of 08/15/21 1358  Tue Aug 15, 2021  1356 US shows large gall stone, no signs of cholecystitis or obstruction on labs. Recommend outpatient Gen Surg follow up. RTED for fever, persistent pain/vomiting or for any other concerns.  [CS]    Clinical Course User Index [CS] Pollyann Savoy, MD    Final Clinical Impression(s) / ED Diagnoses Final diagnoses:  Biliary colic  Gall stones  Hemorrhoids, unspecified hemorrhoid type    Rx / DC Orders ED Discharge Orders     None        Pollyann Savoy, MD 08/15/21 1358

## 2021-08-15 NOTE — ED Notes (Signed)
Patient verbalizes understanding of discharge instructions. Opportunity for questioning and answers were provided. Patient discharged from ED.  °

## 2021-08-15 NOTE — ED Notes (Signed)
20 g placed in the L AC without complication. Patient labs drawn and taken to lab. IV flushed and saline locked.

## 2021-08-15 NOTE — ED Triage Notes (Signed)
Pt arrives to ED withy c/o of right sided abdominal pain and right flank pain that has been intermittent over the past six months. Pt reports the pain worsens after pt finished eating. The pain is constant and sharp after he eats. He reports he had an abnormal CT abdomen pelvis x1 year ago that showed chronic cholelithiasis. Associated symptoms include dry heaves, belching, and nausea.

## 2021-08-17 NOTE — Research (Signed)
Late entry:  Saw patient in clinic (08/08/2021) for his 1 year titration  Patient is doing well, no stim or issues.  Patient had carotid doppler, echo, and CTA of neck today.   SECTION A:  Administrative Section  Patient ID: _[id number]_ - _012_ - 015 Patient Initials: _W_ _K_ _L_  Visit Interval:    []  Screening/Baseline*    []  Activation   []  0.5 Month       []  1 Month     []  2 Month     []  3 Month       []  6 Month     [x]  12 Month         []  Unscheduled, reason for visit: _________________________________________  * For Screening / Baseline only the questions are based on 30 days prior to consent.  SECTION B:  COVID-19 Like Illness Symptoms   Has the subject experienced any cold, flu or COVID-19 symptoms since the last study visit? [x]  No  (skip to section C)     []  Yes, date of onset:  _____/_____ MMM/YYYY    If yes, check all symptoms that apply:   []  Fevers or chills   []  New or Worsening Cough  []  Productive  []  Dry    []  If yes to cough, indicate severity  []  Constant  []  Occasional, several per hour   []  New or worsened shortness of breath   []  Diarrhea   []  Altered or reduced sense of smell or taste   []  Muscle aches/Severe Fatigue   []  Chest pain or tightness   []  Sore throat   []  Nausea or vomiting  SECTION C:  COVID-19 Like Illness Testing   Has the patient been tested for COVID-19 since the last study visit? [x]  No     []  Yes, date of test:  _____/_____ MMM/YYYY    If yes, test results:   []  Positive []  Negative []  Unknown  Has the patient been tested for COVID-19 Antibodies since the last study visit? [x]  No     []  Yes, date of test:  _____/_____ MMM/YYYY    If yes, test results:   []  Positive []  Negative []  Unknown  Has the patient been vaccinated for COVID-19 since the last study visit? [x]  No     []  Yes, date of test:  _____/_____ MMM/YYYY  Has the patient been tested for Influenza ("flu") since the last study visit? [x]  No     []  Yes, date of test:   _____/_____ MMM/YYYY    If yes, test results:   []  Positive []  Negative []  Unknown  Has the patient been vaccinated for Influenza ("flu") since the last study visit? [x]  No     []  Yes, date of test:  _____/_____ MMM/YYYY  SECTION D:  COVID-19 Like Illness Exposure  Has the subject been told that they might have had COVID-19/have symptoms suggestive of COVID-19 since the last study visit? [x]  No   []  Yes   []  Unknown  Has the subject been exposed to anyone with known or suspected COVID-19 since the last study visit? [x]  No   []  Yes   []  Unknown  Has the subject been told that they might have had the "flu" or influenza since the last study visit? [x]  No   []  Yes   []  Unknown  SECTION E:  Effects of COVID Pandemic on Subject's Healthcare Interactions  Since the last study visit, did the subject feel the need to go to an emergency  department or hospital for their heart failure but decided not to because of concerns about COVID-19? [x]  No   []  Yes   []  Unknown    If yes, how did they seek care (check all that apply):   []  Telemedicine visit []  In-person []  Clinic []  Urgent Care   []  Subject did not change hospital/ER use due to COVID-19 []  Other, specify: ________________________  Since the last study visit, did the subject have a cardiology/HF related appointment cancelled/rescheduled due to COVID-19 pandemic? [x]  No   []  Yes, how many? ___________   []  Unknown  Since the last study visit, did the subject have any cardiology/HF related telemedicine visit due to COVID-19 pandemic? [x]  No   []  Yes, how many? ___________   []  Unknown  Since the last study visit, did the subject have a cardiology/HF procedure cancelled/rescheduled due to COVID-19 pandemic? [x]  No   []  Yes, how many? ___________   []  Unknown  SECTION F:  Effects of COVID Pandemic on Subject's Medications  Since the last visit, did any of their heart failure medications change or stop, even for a short time?   If yes, enter  change into medication eCRF [x]  No   []  Yes, how many? ___________   []  Unknown    If yes, why:   []  Instructed by Doctor []  Self-discontinued []  Unknown  Other: ________________  Since the last visit, was the subject prescribed any medications for COVID-19? [x]  No   []  Yes   []  Unknown    If yes, what medications (generic name):  SECTION G:  Effects of COVID Pandemic on Subject's Lifestyle  How has the subject's activity/exercise level changed due to COVID-19 pandemic? [x]  No change   []  More activity/exercise   []  Less activity/exercise  How has the subject's smoking habits changed due to COVID-19? []  Does not smoke   [x]  No change   []  Smoke more   []  Smoke less  How has the subject's alcohol drinking habits changed due to COVID-19? []  Does not drink   [x]  No change   []  Drink more   []  Drink less  Section H:  Signature    Person completing form (Print Name): :) ___________________  Signature: :) _______ Date: _11/08/2022_________________________       Section A:  Administrative Section  Subject ID: __[id number]__ - _012_ - 015 Subject Initials: _W_ _K_ _L_  Visit Interval:    []  Screening/Baseline    []  Activation   []  0.5 Month       []  1 Month     []  2 Month     []  3 Month       []  6 Month     [x]  12 Month         []  Unscheduled, reason for visit: _________________________________________  Section B:  Device Information   Battery Battery Voltage: 2.95 V   Battery Life: 38 Months   RRT Date: 19-Oct-2024 (DD/MMM/YYYY)  Lead Impeadance Right Lead __914_ Ohms    []  Low    []  High   Left Lead _________ Ohms    []  Low    [x]  High  Programmed Settings Pathway Pulse Width Amplitude Frequency   []  Left 125 6.8 40   [x]  Right     Section C:  Programming Information (12 Month and Unscheduled - not required)  Date: _08_/_NOV_/_2022_     (DD / MMM / YYYY) Device information, therapy schedule and programmed settings can be found  on the  session summary report  Site Clinician Present: __Kimberly Ivory Broad :) ________________________  [employer] Employee helping with programming: Nadeen Landau ___________ []  N/A  Location of [employer] person: [x]  []  Onsite Remote  Subject Experience   1. Did the subject experience transient bradycardia or hypotension during device testing? []  [x]  Yes No   2. Did the subject experience transient electrical stimulation of non-vascular tissues? []  [x]  Yes No   3. If yes to either question above, was intervention beyond reprogramming needed or was it associated with an additional untoward event? []  []  Yes No   4a. Is there any indication that there has been unacceptable device interaction between the [employer] device and other implanted electrical stimulators or sensors device? []  [x]  []  Yes No N/A (no other device)   4b.                   Section D:  Arrythmia Interventions  Has the subject received a cardiac ablation since the last study visit? []  Yes  Date of last procedure:   ____/____/_____ (DD/MMM/YYYY)   [x]  No   []  Unknown   If yes, what type and # of ablations Type []  []  Atrial     []  []  Ventricular    # of procedures: ____________   Was this pre-planned prior to [employer] implant? []  Yes    []  No (complete/update AE form)  Has the subject received a cardioversion since the last study visit? []  Yes  Date of last procedure:   ____/____/_____ (DD/MMM/YYYY)   [x]  No   []  Unknown   If yes, what type and # of cardioversions? Type []  Medications     []  Electrical Shock    # of procedures: _____________   Was this pre-planned prior to [employer] implant? []  Yes    []   No (complete/update AE form)    Section E:  Adverse Events (N/A for Implant Interval)  Have any new adverse events or updates to existing events occurred since last visit? []   Yes (complete/update AE form)   [x]   No  Section F:  Medication Changes   Have there been any changes to the subject's home use medications for  Arrhythmia, Antiplatelet/Anticoagulation and Heart failure medications since the last visit? []  Yes (update Med form)   [x]  No  Section G:  Comments     N/A          Section H:  Signature   Person completing form (Print Name): ___Kimberly :) ______________  Signature: :) ______ Date: __11/08/2022_________    Section A:  Administrative Section  Subject ID: _[id number]__ - _012_ - 015 Subject Initials: _W_ _K_ _L__  Visit Interval:    []  Screening/Baseline    []  Activation   []  0.5 Month       []  1 Month     []  2 Month     []  3 Month       []  6 Month     [x]  12 Month         []  Unscheduled, reason for visit: _________________________________________  Section B:  Physical Assessment   Date:    _08__/_NOV_/_2022______   (DD / MMM / YYYY)  Weight: __208_  []  KG  [x]  pounds Height (Screening Visit Only): ________  []  cm []  inches  Blood Pressure: _115_  / _84_mmHg Heart Rate: _65_ bpm  Section E:  Signature     Person completing form (Print Name): __Kimberly Oluwadamilare Tobler :) ________  Signature  Ivory Broad :) _____ Date: _11/08/2022_________________________       Section A:  Administrative Section   Subject ID:  _[id number]_ - _012_ - 015  Subject Initials:  _W_ _K_ _L_  Visit Interval:   (*if required)   []  3 Month       []  6 Month       [x]  12 Month       []  Unscheduled*   Section B:  CTA   If the subject had a CTA at baseline, a CTA at 44-month should be performed   Was a CTA performed?  Yes [x]  Not Done   []   CTA Date: _08__/_NOV__/_2022__                           (DD / MMM / Annamarie Major)  Images Sent to [facility]    [x]   Stenosis  Internal Carotid (ICA)  Distal Common [facility]ot[facility])   Right Side    __0_____%    ___0____%   Left Side    ___0____%    _0______%   Name of person reading CTA: Arn Medal MD   Section C:  CDU   Was a CDU performed?  Yes    [x]     Not Done []   CDU Date: __08_/_NOV_/__2022__                           (DD / MMM /  Annamarie Major)  Images Sent to [facility]    [x]   Stenosis  Internal Carotid (ICA)  Distal Common Carotid [facility])   Right Side    __<15__%    __<15____%   Left Side    ___<15__%    ___<15___%   Name of person reading CDU:  Waverly Ferrari, MD   Section D:  New Stenosis   Is there new stenosis noted on CDU or CTA?  [x]  No  []  Yes (Specify side):       []  Left   []  Right  []  Both     If yes is there a greater than 50% stenosis in either artery?  []  No []  Yes (Specify side**):        []  Left   []  Right  []  Both   ** CTA should be completed if ?50% stenosis on the implanted side only at 86-month visit      Section E:  63-month Follow-up Only    Has the subject had new stenosis >40% since baseline in the artery where the lead was implanted?  [x]  No      []  Yes#, please indicate % new stenosis: _______%  (comment below)   Comments on determination of >40% stenosis:   # Subject must be followed after the 46-month visit, please see section 3.4.27 Final Study Visit at 12 Months   Section :  Comments    NA        Section G:  Signature       Person completing form (Print Name): __Kimberly Ivory Broad :) ____    Signature: Mercer Pod :) _    Date: _11/08/2022______________    Section A:  Administrative Section  Subject ID: _[id number]_ - _012__ - 015 Subject Initials: _W_ _K_ _L_ Visit Interval:  []  Baseline     []  6 Month     [x]  12 Month  Section B:  Cardiac Echo   Cardiac Echo Date:  _08__/_NOV__/_2022______ (DD / MMM / Annamarie Major)  [x]  Images  sent to [facility]  Heart rate _79___ bpm Echo type: [x]  2D Images Captured: (4-Chamber and Biplane required) [x]  PS-LAX  Blood Pressure _115_/_84_ mmHg  []  3D  [x]  Biplane        [x]  Apical 4-Chamber        []  Other, specify:_______   LV End Diastolic Dimension ____56___________ mm   LV End Systolic Dimension ____47___________ mm   LA Volume _____30__________ mL   LV End-Diastolic Volume _____137__________ mL   LV End-Systolic Volume _____77__________ mL   LV  Ejection Fraction _______________ %  Section C:  Comments  N/A          Section C:  Signature   Person completing form (Print Name): Mercer Pod :) ___________  Signature: Mercer Pod :) ___ Date: _08/NOV/2022_____   CRM device information was used from last transmission on 09/15    Current Outpatient Medications:    aspirin EC 81 MG EC tablet, Take 1 tablet (81 mg total) by mouth daily., Disp: 30 tablet, Rfl: 11   atorvastatin (LIPITOR) 80 MG tablet, TAKE 1 TABLET BY MOUTH EVERY DAY, Disp: 90 tablet, Rfl: 2   carvedilol (COREG) 3.125 MG tablet, TAKE 1 TABLET BY MOUTH TWICE A DAY WITH A MEAL, Disp: 180 tablet, Rfl: 3   clopidogrel (PLAVIX) 75 MG tablet, TAKE 1 TABLET BY MOUTH EVERY DAY, Disp: 90 tablet, Rfl: 2   ENTRESTO 24-26 MG, TAKE 1 TABLET BY MOUTH TWICE A DAY, Disp: 60 tablet, Rfl: 11   isosorbide mononitrate (IMDUR) 30 MG 24 hr tablet, 1/2 tablet in the morning, Disp: , Rfl:    nitroGLYCERIN (NITROSTAT) 0.4 MG SL tablet, Place 1 tablet (0.4 mg total) under the tongue every 5 (five) minutes x 3 doses as needed for chest pain., Disp: 25 tablet, Rfl: 12   pantoprazole (PROTONIX) 40 MG tablet, TAKE 1 TABLET BY MOUTH EVERY DAY, Disp: 90 tablet, Rfl: 2   spironolactone (ALDACTONE) 25 MG tablet, Take 0.5 tablets (12.5 mg total) by mouth every other day., Disp: 30 tablet, Rfl: 11

## 2021-09-01 ENCOUNTER — Other Ambulatory Visit: Payer: Self-pay | Admitting: Internal Medicine

## 2021-09-08 ENCOUNTER — Other Ambulatory Visit: Payer: Self-pay

## 2021-09-08 ENCOUNTER — Encounter: Payer: Self-pay | Admitting: Internal Medicine

## 2021-09-08 ENCOUNTER — Other Ambulatory Visit: Payer: Self-pay | Admitting: Internal Medicine

## 2021-09-12 NOTE — Progress Notes (Incomplete)
Cardiology Office Note:    Date:  09/12/2021   ID:  Alan White, DOB [birth year], MRN [id number]  PCP:  Joycelyn Rua, MD [facility] Cardiologist: Chrystie Nose, MD   Reason for visit: 52-month follow-up  History of Present Illness:    Alan White is a 60 y.o. male with a hx of CAD status post DES to the left circumflex and LAD in 2015, tobacco use, chronic systolic heart failure status post ICD placement, dyslipidemia, hypotension limiting goal-directed therapy for heart failure.  He last saw Dr. Rennis Golden in February 2022.  Patient was randomized into the bat wire trial which is a baro stem device similar to pacemaker that stimulates the carotid sinus.  He was working on quitting smoking.  Today, ***  CAD -Status post PCI to LCX and LAD in 2015 -***  Chronic systolic heart failure -Status post ICD placement (([employer]ott Gallant, VR) - 11/2019) -Status post Barostim device implant -***  Hypertension -*** -Goal BP is <130/80.  Recommend DASH diet (high in vegetables, fruits, low-fat dairy products, whole grains, poultry, fish, and nuts and low in sweets, sugar-sweetened beverages, and red meats), salt restriction and increase physical activity.  Hyperlipidemia -*** -Discussed cholesterol lowering diets - Mediterranean diet, DASH diet, vegetarian diet, low-carbohydrate diet and avoidance of trans fats.  Discussed healthier choice substitutes.  Nuts, high-fiber foods, and fiber supplements may also improve lipids.    Obesity -Discussed how even a 5-10% weight loss can have cardiovascular benefits.   -Recommend moderate intensity activity for 30 minutes 5 days/week and the DASH diet.  Tobacco use  -Recommend tobacco cessation.  Reviewed physiologic effects of nicotine and the immediate-eventual benefits of quitting including improvement in cough/breathing and reduction in cardiovascular events.  Discussed quitting tips such as removing triggers and getting support  from family/friends and Quitline [place]. -USPSTF recommends one-time screening for abdominal aortic aneurysm (AAA) by ultrasound in men 70 -4 years old who have ever smoked.       Disposition - Follow-up in ***     Past Medical History:  Diagnosis Date   AICD (automatic cardioverter/defibrillator) present    [employer]) Gallant ICD   CAD (coronary artery disease), 12/29/13 PCI/DES LCX and PCI/DES LAD with overlapping DES  12/30/2013   cath 07/05/14 [place]   CHF (congestive heart failure) (HCC)    Dyslipidemia, goal LDL below 70 12/30/2013   GERD (gastroesophageal reflux disease)    Metabolic syndrome, HgbA1C 6.0  12/30/2013   Myocardial infarction (HCC) 2015,2020   Sleep apnea    by history - patient denies this dx as of 08/01/20   Smoker    Wears partial dentures    upper    Past Surgical History:  Procedure Laterality Date   BAROREFLEX SYSTEM INSERTION Right 08/02/2020   Procedure: BAROREFLEX SYSTEM INSERTION;  Surgeon: Hillis Range, MD;  Location: [facility] INVASIVE CV LAB;  Service: Cardiovascular;  Laterality: Right;   CARDIAC CATHETERIZATION  07/05/14,01/31/19   patent stents   CORONARY ANGIOPLASTY WITH STENT PLACEMENT  12/29/13   DES to LCX and overlapping stents DES mid LAD   CORONARY/GRAFT ACUTE MI REVASCULARIZATION N/A 01/31/2019   Procedure: Coronary/Graft Acute MI Revascularization;  Surgeon: Kathleene Hazel, MD;  Location: [facility] INVASIVE CV LAB;  Service: Cardiovascular;  Laterality: N/A;   ICD IMPLANT N/A 12/17/2019   Procedure: ICD IMPLANT;  Surgeon: Regan Lemming, MD;  Location: [facility] INVASIVE CV LAB;  Service: Cardiovascular;  Laterality: N/A;   LEFT HEART CATH AND CORONARY ANGIOGRAPHY N/A  01/31/2019   Procedure: LEFT HEART CATH AND CORONARY ANGIOGRAPHY;  Surgeon: Kathleene Hazel, MD;  Location: [facility] INVASIVE CV [facility];  Service: Cardiovascular;  Laterality: N/A;   LEFT HEART CATHETERIZATION WITH CORONARY ANGIOGRAM N/A 12/29/2013   Procedure: LEFT HEART CATHETERIZATION WITH  CORONARY ANGIOGRAM;  Surgeon: [name], MD;  Location: [facility] CATH LAB;  Service: Cardiovascular;  Laterality: N/A;   LEFT HEART CATHETERIZATION WITH CORONARY ANGIOGRAM N/A 07/05/2014   Procedure: LEFT HEART CATHETERIZATION WITH CORONARY ANGIOGRAM;  Surgeon: Peter M Swaziland, MD;  Location: [facility] CATH LAB;  Service: Cardiovascular;  Laterality: N/A;   PERCUTANEOUS CORONARY STENT INTERVENTION (PCI-S)  12/29/2013   Procedure: PERCUTANEOUS CORONARY STENT INTERVENTION (PCI-S);  Surgeon: [name], MD;  Location: [facility] CATH LAB;  Service: Cardiovascular;;    Current Medications: No outpatient medications have been marked as taking for the 09/13/21 encounter (Appointment) with Cannon Kettle, PA-C.     Allergies:   Patient has no known allergies.   Social History   Socioeconomic History   Marital status: Married    Spouse name:     Number of children: Not on file   Years of education: Not on file   Highest education level: Not on file  Occupational History   Occupation: Event organiser: [employer]  Tobacco Use   Smoking status: Every Day    Packs/day: 0.50    Years: 40.00    Pack years: 20.00    Types: Cigarettes   Smokeless tobacco: Never  Vaping Use   Vaping Use: Never used  Substance and Sexual Activity   Alcohol use: Yes    Comment: "Once in a blue moon"   Drug use: No   Sexual activity: Not on file  Other Topics Concern   Not on file  Social History Narrative   Pt lives with wife.   Social Determinants of Health   Financial Resource Strain: Not on file  Food Insecurity: Not on file  Transportation Needs: Not on file  Physical Activity: Not on file  Stress: Not on file  Social Connections: Not on file     Family History: The patient's family history includes Cancer in his father and mother; Diabetes in his father and mother; Heart attack in his brother and brother; Stroke in his sister.  ROS:   Please see the history of present illness.      EKGs/Labs/Other Studies Reviewed:    EKG:  The ekg ordered today demonstrates ***  Recent Labs: 08/15/2021: ALT 10; BUN 15; Creatinine, Ser 1.15; Hemoglobin 14.5; Platelets 235; Potassium 4.2; Sodium 140   Recent Lipid Panel Lab Results  Component Value Date/Time   CHOL 117 04/20/2020 08:10 AM   TRIG 95 04/20/2020 08:10 AM   HDL 32 (L) 04/20/2020 08:10 AM   LDLCALC 67 04/20/2020 08:10 AM    Physical Exam:    VS:  There were no vitals taken for this visit.   No data found.  Wt Readings from Last 3 Encounters:  08/08/21 208 lb (94.3 kg)  05/09/21 216 lb (98 kg)  02/02/21 213 lb (96.6 kg)     GEN: *** Well nourished, well developed in no acute distress HEENT: Normal NECK: No JVD; No carotid bruits CARDIAC: ***RRR, no murmurs, rubs, gallops RESPIRATORY:  Clear to auscultation without rales, wheezing or rhonchi  ABDOMEN: Soft, non-tender, non-distended MUSCULOSKELETAL: No edema; No deformity  SKIN: Warm and dry NEUROLOGIC:  Alert and oriented PSYCHIATRIC:  Normal affect  ASSESSMENT AND PLAN   ***   {Are you ordering a CV Procedure (e.g. stress test, cath, DCCV, TEE, etc)?   Press F2        :[id number]}    Medication Adjustments/Labs and Tests Ordered: Current medicines are reviewed at length with the patient today.  Concerns regarding medicines are outlined above.  No orders of the defined types were placed in this encounter.  No orders of the defined types were placed in this encounter.   There are no Patient Instructions on file for this visit.   Signed, Cannon Kettle, PA-C  09/12/2021 1:23 PM    [facility]

## 2021-09-13 ENCOUNTER — Ambulatory Visit: Payer: BC Managed Care – PPO | Admitting: Physician Assistant

## 2021-09-14 ENCOUNTER — Ambulatory Visit (INDEPENDENT_AMBULATORY_CARE_PROVIDER_SITE_OTHER): Payer: BC Managed Care – PPO

## 2021-09-14 DIAGNOSIS — I255 Ischemic cardiomyopathy: Secondary | ICD-10-CM

## 2021-09-14 LAB — CUP PACEART REMOTE DEVICE CHECK
Battery Remaining Longevity: 100 mo
Battery Remaining Percentage: 82 %
Battery Voltage: 2.99 V
Brady Statistic RV Percent Paced: 1 %
Date Time Interrogation Session: 20221215012516
HighPow Impedance: 68 Ohm
Implantable Lead Implant Date: 20210318
Implantable Lead Location: 753860
Implantable Pulse Generator Implant Date: 20210318
Lead Channel Impedance Value: 430 Ohm
Lead Channel Pacing Threshold Amplitude: 0.75 V
Lead Channel Pacing Threshold Pulse Width: 0.5 ms
Lead Channel Sensing Intrinsic Amplitude: 12 mV
Lead Channel Setting Pacing Amplitude: 2.5 V
Lead Channel Setting Pacing Pulse Width: 0.5 ms
Lead Channel Setting Sensing Sensitivity: 0.5 mV
Pulse Gen Serial Number: 111013899

## 2021-09-22 ENCOUNTER — Ambulatory Visit: Payer: BC Managed Care – PPO | Admitting: Adult Health

## 2021-09-26 NOTE — Progress Notes (Signed)
Remote ICD transmission.   

## 2021-10-19 ENCOUNTER — Other Ambulatory Visit: Payer: Self-pay | Admitting: Internal Medicine

## 2021-11-14 ENCOUNTER — Other Ambulatory Visit: Payer: Self-pay

## 2021-11-14 ENCOUNTER — Ambulatory Visit: Payer: BC Managed Care – PPO | Admitting: Physician Assistant

## 2021-11-14 ENCOUNTER — Encounter: Payer: Self-pay | Admitting: Physician Assistant

## 2021-11-14 VITALS — BP 108/71 | HR 73 | Ht 72.0 in | Wt 209.2 lb

## 2021-11-14 DIAGNOSIS — R6883 Chills (without fever): Secondary | ICD-10-CM | POA: Diagnosis not present

## 2021-11-14 DIAGNOSIS — I255 Ischemic cardiomyopathy: Secondary | ICD-10-CM | POA: Diagnosis not present

## 2021-11-14 DIAGNOSIS — E785 Hyperlipidemia, unspecified: Secondary | ICD-10-CM | POA: Diagnosis not present

## 2021-11-14 DIAGNOSIS — I2510 Atherosclerotic heart disease of native coronary artery without angina pectoris: Secondary | ICD-10-CM

## 2021-11-14 DIAGNOSIS — Z95810 Presence of automatic (implantable) cardiac defibrillator: Secondary | ICD-10-CM

## 2021-11-14 LAB — TSH+T4F+T3FREE
Free T4: 1.28 ng/dL (ref 0.82–1.77)
T3, Free: 3.5 pg/mL (ref 2.0–4.4)
TSH: 2.96 u[IU]/mL (ref 0.450–4.500)

## 2021-11-14 LAB — CBC WITH DIFFERENTIAL/PLATELET
Basophils Absolute: 0 10*3/uL (ref 0.0–0.2)
Basos: 0 %
EOS (ABSOLUTE): 0.2 10*3/uL (ref 0.0–0.4)
Eos: 2 %
Hematocrit: 42.7 % (ref 37.5–51.0)
Hemoglobin: 14.4 g/dL (ref 13.0–17.7)
Immature Grans (Abs): 0 10*3/uL (ref 0.0–0.1)
Immature Granulocytes: 0 %
Lymphocytes Absolute: 3.3 10*3/uL — ABNORMAL HIGH (ref 0.7–3.1)
Lymphs: 34 %
MCH: 30.6 pg (ref 26.6–33.0)
MCHC: 33.7 g/dL (ref 31.5–35.7)
MCV: 91 fL (ref 79–97)
Monocytes Absolute: 0.7 10*3/uL (ref 0.1–0.9)
Monocytes: 7 %
Neutrophils Absolute: 5.7 10*3/uL (ref 1.4–7.0)
Neutrophils: 57 %
Platelets: 236 10*3/uL (ref 150–450)
RBC: 4.71 x10E6/uL (ref 4.14–5.80)
RDW: 13 % (ref 11.6–15.4)
WBC: 9.9 10*3/uL (ref 3.4–10.8)

## 2021-11-14 LAB — COMPREHENSIVE METABOLIC PANEL
ALT: 11 IU/L (ref 0–44)
AST: 15 IU/L (ref 0–40)
Albumin/Globulin Ratio: 1.7 (ref 1.2–2.2)
Albumin: 4.3 g/dL (ref 3.8–4.8)
Alkaline Phosphatase: 141 IU/L — ABNORMAL HIGH (ref 44–121)
BUN/Creatinine Ratio: 13 (ref 10–24)
BUN: 15 mg/dL (ref 8–27)
Bilirubin Total: 0.4 mg/dL (ref 0.0–1.2)
CO2: 26 mmol/L (ref 20–29)
Calcium: 10 mg/dL (ref 8.6–10.2)
Chloride: 102 mmol/L (ref 96–106)
Creatinine, Ser: 1.2 mg/dL (ref 0.76–1.27)
Globulin, Total: 2.5 g/dL (ref 1.5–4.5)
Glucose: 83 mg/dL (ref 70–99)
Potassium: 4.5 mmol/L (ref 3.5–5.2)
Sodium: 139 mmol/L (ref 134–144)
Total Protein: 6.8 g/dL (ref 6.0–8.5)
eGFR: 68 mL/min/{1.73_m2} (ref >59)

## 2021-11-14 NOTE — Progress Notes (Signed)
Cardiology Office Note:    Date:  11/16/2021   ID:  Alan White, DOB 1960-11-23, MRN [id number]  PCP:  Joycelyn Rua, MD   [facility] Providers Cardiologist:  Chrystie Nose, MD Electrophysiologist:  Regan Lemming, MD     Referring MD: Joycelyn Rua, MD   Chief Complaint  Patient presents with   Follow-up    Seen for Dr. Rennis Golden    History of Present Illness:    [name] is a 61 y.o. male with a hx of prediabetes, likely OSA, tobacco abuse, HLD, and CAD. Last hemoglobin A1c 6.0 on 12/28/2013. He was admitted for NSTEMI and underwent cath on 12/29/2013 which showed EF 50-55%, normal RCA, 85-90% mid LCx treated 3.5 x 12 mm Promus Premier DES postdilated to 3.75 mm, 90% proximal to mid LAD stenosis treated with a 3.0 x 16 mm Promus primary DES overlapped with a 2.75 x 16 mm Promus DES. Echocardiogram obtained 12/29/2013 showed EF 45-50%, mid to distal anteroapical and inferoapical hypokinesis, mild LVH. He had a recurrent chest discomfort and underwent relook cath in October 2015 that showed nonobstructive CAD, EF 55-65%. Patient returned to the hospital on 01/31/2019 with anterolateral STEMI after stopping all his cardiac medications. Emergent cardiac catheterization obtained on 01/31/2019 showed 100% proximal LAD occlusion treated with balloon angioplasty, 40% ostial to proximal LAD disease, 20% proximal RCA disease, patent left circumflex stent.  Echocardiogram obtained on the following day showed EF 35 to 40% with apical ballooning consistent with Takotsubo cardiomyopathy, no significant valve disease.  Postprocedure, troponin trended up to >65.  EKG prior to discharge continue to show anterolateral infarct with diffuse T wave inversion in the anterior leads.  Postprocedure, he was placed on aspirin and Plavix for life.  He was also placed on statin, Imdur, carvedilol, losartan and spironolactone.  He underwent St Jude ICD on 12/17/2019.  Repeat echocardiogram in October 2021  continue to show EF 30 to 35%, akinesis of the distal anteroseptal and apical wall.  Patient had Barostim device implanted on 08/02/2020.  Most recent echocardiogram obtained on 08/08/2021 showed EF 40 to 45%.  He has been fully vaccinated with COVID vaccine times 2+ a booster.  He was most recently seen by Dr Dr. Rennis Golden in February 2022 at which time he was doing well.  Most recent echocardiogram obtained on 08/20/2021 showed EF 40 to 45%, possible wall motion abnormality.  Patient presents today for follow-up for evaluation of chills that has been going on for the past several months.  He denies any worsening shortness of breath.  Other than the chills, he also noticed some tingling sensation when he rests his arm on top of each other.  Otherwise he denies any chest pain.  I recommend that evaluating his thyroid function, electrolyte and hemoglobin level.  He does not have any fevers, the duration of his symptoms argues against infection.  His recent echocardiogram in November showed improved EF.  I recommended proceeding with lab work to rule out secondary causes for his chills and the tingling sensation.  If laboratories are normal, I would recommend to hold off on additional evaluation unless he has worsening dyspnea at which point we can repeat echocardiogram.   Past Medical History:  Diagnosis Date   AICD (automatic cardioverter/defibrillator) present    Abbott (St Jude) [place] ICD   CAD (coronary artery disease), 12/29/13 PCI/DES LCX and PCI/DES LAD with overlapping DES  12/30/2013   cath 07/05/14 OK   CHF (congestive heart failure) (HCC)  Dyslipidemia, goal LDL below 70 12/30/2013   GERD (gastroesophageal reflux disease)    Metabolic syndrome, HgbA1C 6.0  12/30/2013   Myocardial infarction [facility]) 2015,2020   Sleep apnea    by history - patient denies this dx as of 08/01/20   Smoker    Wears partial dentures    upper    Past Surgical History:  Procedure Laterality Date   BAROREFLEX SYSTEM  INSERTION Right 08/02/2020   Procedure: BAROREFLEX SYSTEM INSERTION;  Surgeon: Hillis Range, MD;  [facility]: [facility] INVASIVE CV LAB;  Service: Cardiovascular;  Laterality: Right;   CARDIAC CATHETERIZATION  07/05/14,01/31/19   patent stents   CORONARY ANGIOPLASTY WITH STENT PLACEMENT  12/29/13   DES to LCX and overlapping stents DES mid LAD   CORONARY/GRAFT ACUTE MI REVASCULARIZATION N/A 01/31/2019   Procedure: Coronary/Graft Acute MI Revascularization;  Surgeon: Kathleene Hazel, MD;  Location: [facility] INVASIVE CV LAB;  Service: Cardiovascular;  Laterality: N/A;   ICD IMPLANT N/A 12/17/2019   Procedure: ICD IMPLANT;  Surgeon: Regan Lemming, MD;  Location: [facility] INVASIVE CV LAB;  Service: Cardiovascular;  Laterality: N/A;   LEFT HEART CATH AND CORONARY ANGIOGRAPHY N/A 01/31/2019   Procedure: LEFT HEART CATH AND CORONARY ANGIOGRAPHY;  Surgeon: Kathleene Hazel, MD;  Location: [facility] INVASIVE CV LAB;  Service: Cardiovascular;  Laterality: N/A;   LEFT HEART CATHETERIZATION WITH CORONARY ANGIOGRAM N/A 12/29/2013   Procedure: LEFT HEART CATHETERIZATION WITH CORONARY ANGIOGRAM;  Surgeon: [name], MD;  Location: [facility] CATH LAB;  Service: Cardiovascular;  Laterality: N/A;   LEFT HEART CATHETERIZATION WITH CORONARY ANGIOGRAM N/A 07/05/2014   Procedure: LEFT HEART CATHETERIZATION WITH CORONARY ANGIOGRAM;  Surgeon: Peter M Swaziland, MD;  Location: [facility] CATH LAB;  Service: Cardiovascular;  Laterality: N/A;   PERCUTANEOUS CORONARY STENT INTERVENTION (PCI-S)  12/29/2013   Procedure: PERCUTANEOUS CORONARY STENT INTERVENTION (PCI-S);  Surgeon: [name], MD;  Location: [facility] CATH LAB;  Service: Cardiovascular;;    Current Medications: Current Meds  Medication Sig   aspirin EC 81 MG EC tablet Take 1 tablet (81 mg total) by mouth daily.   atorvastatin (LIPITOR) 80 MG tablet TAKE 1 TABLET BY MOUTH EVERY DAY   carvedilol (COREG) 3.125 MG tablet TAKE 1 TABLET BY MOUTH TWICE A DAY WITH MEALS   clopidogrel  (PLAVIX) 75 MG tablet TAKE 1 TABLET BY MOUTH EVERY DAY   ENTRESTO 24-26 MG TAKE 1 TABLET BY MOUTH TWICE A DAY   isosorbide mononitrate (IMDUR) 30 MG 24 hr tablet 1/2 tablet in the morning   nitroGLYCERIN (NITROSTAT) 0.4 MG SL tablet Place 1 tablet (0.4 mg total) under the tongue every 5 (five) minutes x 3 doses as needed for chest pain.   pantoprazole (PROTONIX) 40 MG tablet TAKE 1 TABLET BY MOUTH EVERY DAY   spironolactone (ALDACTONE) 25 MG tablet TAKE 1/2 TABLET BY MOUTH EVERY DAY     Allergies:   Patient has no known allergies.   Social History   Socioeconomic History   Marital status: Married    Spouse name:     Number of children: Not on file   Years of education: Not on file   Highest education level: Not on file  Occupational History   Occupation: Event organiser: [employer]  Tobacco Use   Smoking status: Every Day    Packs/day: 0.50    Years: 40.00    Pack years: 20.00    Types: Cigarettes   Smokeless tobacco: Never  Vaping Use  Vaping Use: Never used  Substance and Sexual Activity   Alcohol use: Yes    Comment: "Once in a blue moon"   Drug use: No   Sexual activity: Not on file  Other Topics Concern   Not on file  Social History Narrative   Pt lives with wife.   Social Determinants of Health   Financial Resource Strain: Not on file  Food Insecurity: Not on file  Transportation Needs: Not on file  Physical Activity: Not on file  Stress: Not on file  Social Connections: Not on file     Family History: The patient's family history includes Cancer in his father and mother; Diabetes in his father and mother; Heart attack in his brother and brother; Stroke in his sister.  ROS:   Please see the history of present illness.     All other systems reviewed and are negative.  EKGs/Labs/Other Studies Reviewed:    The following studies were reviewed today:  Echo 08/08/2021 1. Cannot exclude apical and mid to distal anterior hypokinesis.   Diasotlic function cannot be calculated due to paced rhythm although  appears to be normal. Left ventricular ejection fraction, by estimation,  is 40 to 45%. The left ventricle has mildly  decreased function. The left ventricle demonstrates global hypokinesis.  The left ventricular internal cavity size was mildly dilated.  Indeterminate diastolic filling due to E-A fusion.   2. Right ventricular systolic function is normal. The right ventricular  size is normal.   3. Left atrial size was mildly dilated.   4. The mitral valve is normal in structure. Trivial mitral valve  regurgitation. No evidence of mitral stenosis.   5. The aortic valve is tricuspid. Aortic valve regurgitation is not  visualized. Mild aortic valve sclerosis is present, with no evidence of  aortic valve stenosis.   6. The inferior vena cava is normal in size with greater than 50%  respiratory variability, suggesting right atrial pressure of 3 mmHg.   Comparison(s): No significant change from prior study. 02/14/2021.   EKG:  EKG is ordered today.  The ekg ordered today demonstrates normal sinus rhythm, poor R wave progression in the anterior leads.  Recent Labs: 11/14/2021: ALT 11; BUN 15; Creatinine, Ser 1.20; Hemoglobin 14.4; Platelets 236; Potassium 4.5; Sodium 139; TSH 2.960  Recent Lipid Panel    Component Value Date/Time   CHOL 117 04/20/2020 0810   TRIG 95 04/20/2020 0810   HDL 32 (L) 04/20/2020 0810   CHOLHDL 3.7 04/20/2020 0810   CHOLHDL 4.3 08/10/2014 0810   VLDL 27 08/10/2014 0810   LDLCALC 67 04/20/2020 0810     Risk Assessment/Calculations:           Physical Exam:    VS:  BP 108/71    Pulse 73    Ht 6' (1.829 m)    Wt 209 lb 3.2 oz (94.9 kg)    SpO2 99%    BMI 28.37 kg/m     Wt Readings from Last 3 Encounters:  11/14/21 209 lb 3.2 oz (94.9 kg)  08/08/21 208 lb (94.3 kg)  05/09/21 216 lb (98 kg)     GEN:  Well nourished, well developed in no acute distress HEENT: Normal NECK: No JVD;  No carotid bruits LYMPHATICS: No lymphadenopathy CARDIAC: RRR, no murmurs, rubs, gallops RESPIRATORY:  Clear to auscultation without rales, wheezing or rhonchi  ABDOMEN: Soft, non-tender, non-distended MUSCULOSKELETAL:  No edema; No deformity  SKIN: Warm and dry NEUROLOGIC:  Alert and oriented x 3  PSYCHIATRIC:  Normal affect   ASSESSMENT:    1. Chills   2. Coronary artery disease involving native coronary artery of native heart without angina pectoris   3. Ischemic cardiomyopathy   4. Hyperlipidemia LDL goal <70   5. ICD (implantable cardioverter-defibrillator) in place    PLAN:    In order of problems listed above:  Chills: Unclear cause for chills.  He has no fever however constantly feel cold.  Symptom has been going on for months which somewhat argues against infection.  Will obtain lab work including thyroid panel, CMP and CBC.  Patient denies any recent chest discomfort.  Given the fact he just had a echocardiogram in November, will hold off on repeating echocardiogram at this time  CAD: Denies any recent chest pain.  On aspirin and Plavix  Ischemic cardiomyopathy: EF improved to 40 to 45% on last echocardiogram in November 2022.  Continue carvedilol and Entresto  Hyperlipidemia: On Lipitor  Status post ICD: Followed by EP service.           Medication Adjustments/Labs and Tests Ordered: Current medicines are reviewed at length with the patient today.  Concerns regarding medicines are outlined above.  Orders Placed This Encounter  Procedures   TSH+T4F+T3Free   Comprehensive metabolic panel   CBC with Differential/Platelet   EKG 12-Lead   No orders of the defined types were placed in this encounter.   Patient Instructions  Medication Instructions:  Your physician recommends that you continue on your current medications as directed. Please refer to the Current Medication list given to you today.   *If you need a refill on your cardiac medications before your  next appointment, please call your pharmacy*   Lab Work: Your physician recommends that you return for lab work today MP CBC w diff TSH+T4F+T3Free  If you have labs (blood work) drawn today and your tests are completely normal, you will receive your results only by: MyChart Message (if you have MyChart) OR A paper copy in the mail If you have any lab test that is abnormal or we need to change your treatment, we will call you to review the results.   Testing/Procedures: NONE ordered at this time of appointment     Follow-Up: At [facility], you and your health needs are our priority.  As part of our continuing mission to provide you with exceptional heart care, we have created designated Provider Care Teams.  These Care Teams include your primary Cardiologist (physician) and Advanced Practice Providers (APPs -  Physician Assistants and Nurse Practitioners) who all work together to provide you with the care you need, when you need it.  We recommend signing up for the patient portal called "MyChart".  Sign up information is provided on this After Visit Summary.  MyChart is used to connect with patients for Virtual Visits (Telemedicine).  Patients are able to view lab/test results, encounter notes, upcoming appointments, etc.  Non-urgent messages can be sent to your provider as well.   To learn more about what you can do with MyChart, go to [email or web address].    Your next appointment:   6 month(s)  The format for your next appointment:   In Person  Provider:   Chrystie Nose, MD     Other Instructions    Signed, Azalee Course, PA  11/16/2021 10:34 PM    [facility]

## 2021-11-14 NOTE — Patient Instructions (Signed)
Medication Instructions:  Your physician recommends that you continue on your current medications as directed. Please refer to the Current Medication list given to you today.   *If you need a refill on your cardiac medications before your next appointment, please call your pharmacy*   Lab Work: Your physician recommends that you return for lab work today MP CBC w diff TSH+T4F+T3Free  If you have labs (blood work) drawn today and your tests are completely normal, you will receive your results only by: MyChart Message (if you have MyChart) OR A paper copy in the mail If you have any lab test that is abnormal or we need to change your treatment, we will call you to review the results.   Testing/Procedures: NONE ordered at this time of appointment     Follow-Up: At [facility], you and your health needs are our priority.  As part of our continuing mission to provide you with exceptional heart care, we have created designated Provider Care Teams.  These Care Teams include your primary Cardiologist (physician) and Advanced Practice Providers (APPs -  Physician Assistants and Nurse Practitioners) who all work together to provide you with the care you need, when you need it.  We recommend signing up for the patient portal called "MyChart".  Sign up information is provided on this After Visit Summary.  MyChart is used to connect with patients for Virtual Visits (Telemedicine).  Patients are able to view lab/test results, encounter notes, upcoming appointments, etc.  Non-urgent messages can be sent to your provider as well.   To learn more about what you can do with MyChart, go to [email or web address].    Your next appointment:   6 month(s)  The format for your next appointment:   In Person  Provider:   Chrystie Nose, MD     Other Instructions

## 2021-11-15 ENCOUNTER — Other Ambulatory Visit: Payer: Self-pay

## 2021-11-15 DIAGNOSIS — I2510 Atherosclerotic heart disease of native coronary artery without angina pectoris: Secondary | ICD-10-CM

## 2021-11-15 NOTE — Progress Notes (Signed)
Thyroid level normal. Normal red blood cell and white blood cell count. Normal liver function. Normal electrolyte and stable renal function. Alkaline phosphatase is elevated, this is a nonspecific lab which may correlate with either gallbladder or bone issue. Recommend repeat CMP in 1 month. If Alan White has any right upper abdominal pain, will need to see primary care provider. Otherwise, no clear issue to explain the episodes of chills and tingling sensation, if symptom worsens, may consider a repeat echo, but would hold off for now given the fact he just had a echo in Nov 2022.

## 2021-11-16 ENCOUNTER — Encounter: Payer: Self-pay | Admitting: Physician Assistant

## 2021-11-25 ENCOUNTER — Other Ambulatory Visit: Payer: Self-pay | Admitting: Cardiology

## 2021-12-11 ENCOUNTER — Other Ambulatory Visit: Payer: Self-pay

## 2021-12-11 DIAGNOSIS — I2510 Atherosclerotic heart disease of native coronary artery without angina pectoris: Secondary | ICD-10-CM

## 2021-12-22 ENCOUNTER — Ambulatory Visit (INDEPENDENT_AMBULATORY_CARE_PROVIDER_SITE_OTHER): Payer: BC Managed Care – PPO

## 2021-12-22 DIAGNOSIS — I255 Ischemic cardiomyopathy: Secondary | ICD-10-CM | POA: Diagnosis not present

## 2021-12-25 LAB — CUP PACEART REMOTE DEVICE CHECK
Battery Remaining Longevity: 98 mo
Battery Remaining Percentage: 80 %
Battery Voltage: 2.99 V
Brady Statistic RV Percent Paced: 0 %
Date Time Interrogation Session: 20230324085758
HighPow Impedance: 81 Ohm
Implantable Lead Implant Date: 20210318
Implantable Lead Location: 753860
Implantable Pulse Generator Implant Date: 20210318
Lead Channel Impedance Value: 450 Ohm
Lead Channel Pacing Threshold Amplitude: 0.75 V
Lead Channel Pacing Threshold Pulse Width: 0.4 ms
Lead Channel Sensing Intrinsic Amplitude: 12 mV
Lead Channel Setting Pacing Amplitude: 2 V
Lead Channel Setting Pacing Pulse Width: 0.4 ms
Lead Channel Setting Sensing Sensitivity: 0.5 mV
Pulse Gen Serial Number: 111013899

## 2021-12-27 NOTE — Progress Notes (Signed)
Remote ICD transmission.   

## 2022-01-29 NOTE — Progress Notes (Deleted)
Electrophysiology Office Note Date: 01/29/2022  ID:  Alan White, DOB 1961-03-21, MRN [id number]  PCP: Alan Rua, MD Primary Cardiologist: Alan Nose, MD Electrophysiologist: Alan Lemming, MD   CC: Routine ICD follow-up  Alan White is a 61 y.o. male seen today for Alan Jorja Loa, MD for routine electrophysiology followup.  Since last being seen in our clinic the patient reports doing ***.  he denies chest pain, palpitations, dyspnea, PND, orthopnea, nausea, vomiting, dizziness, syncope, edema, weight gain, or early satiety. {He/she (caps):30048} has not had ICD shocks.   Device History: St. Jude Dual Chamber ICD implanted 12/17/2019 for Chronic systolic CHF History of appropriate therapy: No History of AAD therapy: No  S/p Barostim 08/2020 / [facility]WIRE procedure  Past Medical History:  Diagnosis Date   AICD (automatic cardioverter/defibrillator) present    Abbott (St Jude) [place] ICD   CAD (coronary artery disease), 12/29/13 PCI/DES LCX and PCI/DES LAD with overlapping DES  12/30/2013   cath 07/05/14 OK   CHF (congestive heart failure) (HCC)    Dyslipidemia, goal LDL below 70 12/30/2013   GERD (gastroesophageal reflux disease)    Metabolic syndrome, HgbA1C 6.0  12/30/2013   Myocardial infarction (HCC) 2015,2020   Sleep apnea    by history - patient denies this dx as of 08/01/20   Smoker    Wears partial dentures    upper   Past Surgical History:  Procedure Laterality Date   BAROREFLEX SYSTEM INSERTION Right 08/02/2020   Procedure: BAROREFLEX SYSTEM INSERTION;  Surgeon: Alan Range, MD;  Location: [facility] INVASIVE CV LAB;  Service: Cardiovascular;  Laterality: Right;   CARDIAC CATHETERIZATION  07/05/14,01/31/19   patent stents   CORONARY ANGIOPLASTY WITH STENT PLACEMENT  12/29/13   DES to LCX and overlapping stents DES mid LAD   CORONARY/GRAFT ACUTE MI REVASCULARIZATION N/A 01/31/2019   Procedure: Coronary/Graft Acute MI Revascularization;  Surgeon: Alan Hazel, MD;  Location: [facility] INVASIVE CV LAB;  Service: Cardiovascular;  Laterality: N/A;   ICD IMPLANT N/A 12/17/2019   Procedure: ICD IMPLANT;  Surgeon: Alan Lemming, MD;  Location: [facility] INVASIVE CV LAB;  Service: Cardiovascular;  Laterality: N/A;   LEFT HEART CATH AND CORONARY ANGIOGRAPHY N/A 01/31/2019   Procedure: LEFT HEART CATH AND CORONARY ANGIOGRAPHY;  Surgeon: Alan Hazel, MD;  Location: [facility] INVASIVE CV LAB;  Service: Cardiovascular;  Laterality: N/A;   LEFT HEART CATHETERIZATION WITH CORONARY ANGIOGRAM N/A 12/29/2013   Procedure: LEFT HEART CATHETERIZATION WITH CORONARY ANGIOGRAM;  Surgeon: Alan Noe, MD;  Location: [facility] CATH LAB;  Service: Cardiovascular;  Laterality: N/A;   LEFT HEART CATHETERIZATION WITH CORONARY ANGIOGRAM N/A 07/05/2014   Procedure: LEFT HEART CATHETERIZATION WITH CORONARY ANGIOGRAM;  Surgeon: Alan M Swaziland, MD;  Location: [facility] CATH LAB;  Service: Cardiovascular;  Laterality: N/A;   PERCUTANEOUS CORONARY STENT INTERVENTION (PCI-S)  12/29/2013   Procedure: PERCUTANEOUS CORONARY STENT INTERVENTION (PCI-S);  Surgeon: Alan Noe, MD;  Location: [facility] CATH LAB;  Service: Cardiovascular;;    Current Outpatient Medications  Medication Sig Dispense Refill   aspirin EC 81 MG EC tablet Take 1 tablet (81 mg total) by mouth daily. 30 tablet 11   atorvastatin (LIPITOR) 80 MG tablet TAKE 1 TABLET BY MOUTH EVERY DAY 90 tablet 2   carvedilol (COREG) 3.125 MG tablet TAKE 1 TABLET BY MOUTH TWICE A DAY WITH MEALS 180 tablet 3   clopidogrel (PLAVIX) 75 MG tablet TAKE 1 TABLET BY MOUTH EVERY DAY 90 tablet 0   ENTRESTO  24-26 MG TAKE 1 TABLET BY MOUTH TWICE A DAY 60 tablet 11   isosorbide mononitrate (IMDUR) 30 MG 24 hr tablet 1/2 tablet in the morning     nitroGLYCERIN (NITROSTAT) 0.4 MG SL tablet Place 1 tablet (0.4 mg total) under the tongue every 5 (five) minutes x 3 doses as needed for chest pain. 25 tablet 12   pantoprazole (PROTONIX) 40 MG tablet TAKE 1  TABLET BY MOUTH EVERY DAY 90 tablet 3   spironolactone (ALDACTONE) 25 MG tablet TAKE 1/2 TABLET BY MOUTH EVERY DAY 45 tablet 4   No current facility-administered medications for this visit.    Allergies:   Patient has no known allergies.   Social History: Social History   Socioeconomic History   Marital status: Married    Spouse name:     Number of children: Not on file   Years of education: Not on file   Highest education level: Not on file  Occupational History   Occupation: Best boy: [employer]  Tobacco Use   Smoking status: Every Day    Packs/day: 0.50    Years: 40.00    Pack years: 20.00    Types: Cigarettes   Smokeless tobacco: Never  Vaping Use   Vaping Use: Never used  Substance and Sexual Activity   Alcohol use: Yes    Comment: "Once in a blue moon"   Drug use: No   Sexual activity: Not on file  Other Topics Concern   Not on file  Social History Narrative   Pt lives with wife.   Social Determinants of Health   Financial Resource Strain: Not on file  Food Insecurity: Not on file  Transportation Needs: Not on file  Physical Activity: Not on file  Stress: Not on file  Social Connections: Not on file  Intimate Partner Violence: Not on file    Family History: Family History  Problem Relation Age of Onset   Heart attack Brother        Deceased   Heart attack Brother    Stroke Sister    Diabetes Father        also heart disease   Cancer Father        Deceased   Diabetes Mother    Cancer Mother        Deceased    Review of Systems: All other systems reviewed and are otherwise negative except as noted above.   Physical Exam: There were no vitals filed for this visit.   GEN- The patient is well appearing, alert and oriented x 3 today.   HEENT: normocephalic, atraumatic; sclera clear, conjunctiva pink; hearing intact; oropharynx clear; neck supple, no JVP Lymph- no cervical lymphadenopathy Lungs- Clear to ausculation  bilaterally, normal work of breathing.  No wheezes, rales, rhonchi Heart- Regular rate and rhythm, no murmurs, rubs or gallops, PMI not laterally displaced GI- soft, non-tender, non-distended, bowel sounds present, no hepatosplenomegaly Extremities- no clubbing or cyanosis. No edema; DP/PT/radial pulses 2+ bilaterally MS- no significant deformity or atrophy Skin- warm and dry, no rash or lesion; ICD pocket well healed Psych- euthymic mood, full affect Neuro- strength and sensation are intact  ICD interrogation- reviewed in detail today,  See PACEART report  EKG:  EKG is not ordered today. Personal review of EKG ordered  11/14/2021  shows NSR at 72 bpm  Recent Labs: 11/14/2021: ALT 11; BUN 15; Creatinine, Ser 1.20; Hemoglobin 14.4; Platelets 236; Potassium 4.5; Sodium 139; TSH 2.960  Wt Readings from Last 3 Encounters:  11/14/21 209 lb 3.2 oz (94.9 kg)  08/08/21 208 lb (94.3 kg)  05/09/21 216 lb (98 kg)     Other studies Reviewed: Additional studies/ records that were reviewed today include: Previous EP office notes.   Assessment and Plan:  1.  Chronic systolic dysfunction s/p St. Jude dual chamber ICD  euvolemic today Stable on an appropriate medical regimen Normal ICD function See Pace Art report No changes today Barostim device is ***  2. Tobacco abuse Encouraged cessation  3. CAD Denies s/s ischemia  Current medicines are reviewed at length with the patient today.   =  Labs/ tests ordered today include: *** No orders of the defined types were placed in this encounter.    Disposition:   Follow up with {Blank single:19197::"Dr. Allred","Dr. Arlan Organ. Klein","Dr. Camnitz","Dr. Lambert","EP APP"} in {Blank single:19197::"2 weeks","4 weeks","3 months","6 months","12 months","as usual post gen change"}    Signed, Annamaria Helling  01/29/2022 11:33 AM  [facility] [address] [place] [address] [phone]  (office) [phone] (fax)

## 2022-02-05 ENCOUNTER — Encounter: Payer: BC Managed Care – PPO | Admitting: Student

## 2022-02-05 DIAGNOSIS — I2510 Atherosclerotic heart disease of native coronary artery without angina pectoris: Secondary | ICD-10-CM

## 2022-02-05 DIAGNOSIS — I5022 Chronic systolic (congestive) heart failure: Secondary | ICD-10-CM

## 2022-02-05 DIAGNOSIS — I428 Other cardiomyopathies: Secondary | ICD-10-CM

## 2022-06-15 NOTE — Telephone Encounter (Signed)
Mr. Rennels,  We have verified that you have a current VA authorization in place and it should cover the Barostim as well.  [facility] Patient Accounts

## 2022-06-17 ENCOUNTER — Encounter (HOSPITAL_BASED_OUTPATIENT_CLINIC_OR_DEPARTMENT_OTHER): Payer: Self-pay | Admitting: Internal Medicine

## 2022-06-18 NOTE — Progress Notes (Signed)
Electrophysiology Office Note Date: 06/25/2022  ID:  Alan White, DOB [birth year], MRN [id number]  PCP: Joycelyn Rua, MD Primary Cardiologist: Chrystie Nose, MD Electrophysiologist: Regan Lemming, MD   CC: Routine ICD follow-up  Alan White is a 61 y.o. male seen today for [name], MD for routine electrophysiology followup. Since last being seen in our clinic the patient reports doing well from a cardiac perspective. He has been having issues sleeping, feeling like his mind is racing at times. He asks if turning down his BAT would help with this, and re-visited pathophysiology. he denies chest pain, palpitations, dyspnea, PND, orthopnea, nausea, vomiting, dizziness, syncope, edema, weight gain, or early satiety.   He has not had ICD shocks.   Device History: St. Jude Dual Chamber ICD implanted 12/17/2019 for Chronic systolic CHF History of appropriate therapy: No History of AAD therapy: No     Past Medical History:  Diagnosis Date   AICD (automatic cardioverter/defibrillator) present    [employer]) [employer] ICD   CAD (coronary artery disease), 12/29/13 PCI/DES LCX and PCI/DES LAD with overlapping DES  12/30/2013   cath 07/05/14 OK   CHF (congestive heart failure) (HCC)    Dyslipidemia, goal LDL below 70 12/30/2013   GERD (gastroesophageal reflux disease)    Metabolic syndrome, HgbA1C 6.0  12/30/2013   Myocardial infarction (HCC) 2015,2020   Sleep apnea    by history - patient denies this dx as of 08/01/20   Smoker    Wears partial dentures    upper   Past Surgical History:  Procedure Laterality Date   BAROREFLEX SYSTEM INSERTION Right 08/02/2020   Procedure: BAROREFLEX SYSTEM INSERTION;  Surgeon: Hillis Range, MD;  Location: [facility] INVASIVE [facility];  Service: Cardiovascular;  Laterality: Right;   CARDIAC CATHETERIZATION  07/05/14,01/31/19   patent stents   CORONARY ANGIOPLASTY WITH STENT PLACEMENT  12/29/13   DES to LCX and overlapping stents DES mid  LAD   CORONARY/GRAFT ACUTE MI REVASCULARIZATION N/A 01/31/2019   Procedure: Coronary/Graft Acute MI Revascularization;  Surgeon: Kathleene Hazel, MD;  Location: [facility];  Service: Cardiovascular;  Laterality: N/A;   ICD IMPLANT N/A 12/17/2019   Procedure: ICD IMPLANT;  Surgeon: Regan Lemming, MD;  Location: [facility] INVASIVE CV LAB;  Service: Cardiovascular;  Laterality: N/A;   LEFT HEART CATH AND CORONARY ANGIOGRAPHY N/A 01/31/2019   Procedure: LEFT HEART CATH AND CORONARY ANGIOGRAPHY;  Surgeon: Kathleene Hazel, MD;  Location: [facility];  Service: Cardiovascular;  Laterality: N/A;   LEFT HEART CATHETERIZATION WITH CORONARY ANGIOGRAM N/A 12/29/2013   Procedure: LEFT HEART CATHETERIZATION WITH CORONARY ANGIOGRAM;  Surgeon: [name], MD;  Location: [facility] CATH LAB;  Service: Cardiovascular;  Laterality: N/A;   LEFT HEART CATHETERIZATION WITH CORONARY ANGIOGRAM N/A 07/05/2014   Procedure: LEFT HEART CATHETERIZATION WITH CORONARY ANGIOGRAM;  Surgeon: Peter M Swaziland, MD;  Location: [facility] CATH LAB;  Service: Cardiovascular;  Laterality: N/A;   PERCUTANEOUS CORONARY STENT INTERVENTION (PCI-S)  12/29/2013   Procedure: PERCUTANEOUS CORONARY STENT INTERVENTION (PCI-S);  Surgeon: [name], MD;  Location: [facility] CATH LAB;  Service: Cardiovascular;;    Current Outpatient Medications  Medication Sig Dispense Refill   aspirin EC 81 MG EC tablet Take 1 tablet (81 mg total) by mouth daily. 30 tablet 11   atorvastatin (LIPITOR) 80 MG tablet TAKE 1 TABLET BY MOUTH EVERY DAY 90 tablet 2   carvedilol (COREG) 3.125 MG tablet TAKE 1 TABLET BY MOUTH TWICE  A DAY WITH MEALS 180 tablet 3   clopidogrel (PLAVIX) 75 MG tablet TAKE 1 TABLET BY MOUTH EVERY DAY 90 tablet 0   ENTRESTO 24-26 MG TAKE 1 TABLET BY MOUTH TWICE A DAY 60 tablet 11   isosorbide mononitrate (IMDUR) 30 MG 24 hr tablet 1/2 tablet in the morning     nitroGLYCERIN (NITROSTAT) 0.4 MG SL tablet Place 1 tablet (0.4 mg  total) under the tongue every 5 (five) minutes x 3 doses as needed for chest pain. 25 tablet 12   pantoprazole (PROTONIX) 40 MG tablet TAKE 1 TABLET BY MOUTH EVERY DAY 90 tablet 3   spironolactone (ALDACTONE) 25 MG tablet TAKE 1/2 TABLET BY MOUTH EVERY DAY 45 tablet 4   No current facility-administered medications for this visit.    Allergies:   Patient has no known allergies.   Social History: Social History   Socioeconomic History   Marital status: Married    Spouse name:     Number of children: Not on file   Years of education: Not on file   Highest education level: Not on file  Occupational History   Occupation: Event organiser: [employer]  Tobacco Use   Smoking status: Every Day    Packs/day: 0.50    Years: 40.00    Total pack years: 20.00    Types: Cigarettes   Smokeless tobacco: Never  Vaping Use   Vaping Use: Never used  Substance and Sexual Activity   Alcohol use: Yes    Comment: "Once in a blue moon"   Drug use: No   Sexual activity: Not on file  Other Topics Concern   Not on file  Social History Narrative   Pt lives with wife.   Social Determinants of Health   Financial Resource Strain: Not on file  Food Insecurity: Not on file  Transportation Needs: Not on file  Physical Activity: Not on file  Stress: Not on file  Social Connections: Not on file  Intimate Partner Violence: Not on file    Family History: Family History  Problem Relation Age of Onset   Heart attack Brother        Deceased   Heart attack Brother    Stroke Sister    Diabetes Father        also heart disease   Cancer Father        Deceased   Diabetes Mother    Cancer Mother        Deceased    Review of Systems: All other systems reviewed and are otherwise negative except as noted above.   Physical Exam: Vitals:   06/25/22 1158  BP: 118/72  Pulse: 67  SpO2: 98%  Weight: 208 lb (94.3 kg)  Height: 6' (1.829 m)     GEN- The patient is well appearing,  alert and oriented x 3 today.   HEENT: normocephalic, atraumatic; sclera clear, conjunctiva pink; hearing intact; oropharynx clear; neck supple, no JVP Lymph- no cervical lymphadenopathy Lungs- Clear to ausculation bilaterally, normal work of breathing.  No wheezes, rales, rhonchi Heart- Regular  rate and rhythm, no murmurs, rubs or gallops, PMI not laterally displaced GI- soft, non-tender, non-distended, bowel sounds present, no hepatosplenomegaly Extremities- no clubbing or cyanosis. No peripheral edema; DP/PT/radial pulses 2+ bilaterally MS- no significant deformity or atrophy Skin- warm and dry, no rash or lesion; ICD pocket well healed Psych- euthymic mood, full affect Neuro- strength and sensation are intact  ICD interrogation- reviewed in  detail today,  See PACEART report Barostim interrogation - performed personally, see scanned report.   EKG:  EKG is not ordered today. Personal review of EKG ordered 11/14/2021 shows NSR 72 bpm  Recent Labs: 11/14/2021: ALT 11; BUN 15; Creatinine, Ser 1.20; Hemoglobin 14.4; Platelets 236; Potassium 4.5; Sodium 139; TSH 2.960   Wt Readings from Last 3 Encounters:  06/25/22 208 lb (94.3 kg)  11/14/21 209 lb 3.2 oz (94.9 kg)  08/08/21 208 lb (94.3 kg)     Other studies Reviewed: Additional studies/ records that were reviewed today include: Previous EP office notes.   Assessment and Plan:  1.  Chronic systolic dysfunction s/p St. Jude dual chamber ICD  2. Barostim implantation 08/2020 [facility]) euvolemic today Stable on an appropriate medical regimen Normal ICD function See Pace Art report No changes today No changes to BAT programming. At chronic programming. See scanned report.   2. Tobacco abuse Encouraged cessation    Current medicines are reviewed at length with the patient today.    Disposition:   Follow up with EP APP in 6 months    Signed, Shirley Friar, PA-C  06/25/2022 12:10 PM  [address] [place] [address] [phone] (office) [phone] (fax)

## 2022-06-18 NOTE — Telephone Encounter (Signed)
Ok to hold Plavix 5 days prior to surgery and restart when safe from bleeding standpoint after.  Dr. Lemmie Evens

## 2022-06-20 NOTE — Telephone Encounter (Signed)
Hi Dr. Debara Pickett, I am coming on board to cover preop today. This chart has remained in the patient advice requests. I saw your reply to the patient regarding his clearance. Our preop team has not received a formal fax request on this patient and he would typically meet criteria for needing a virtual visit prior to surgery but just wanted to touch base with you to make sure we are OK to bypass this and close this message out. Please route response to P CV DIV PREOP (the pre-op pool). Thank you.

## 2022-06-21 NOTE — Telephone Encounter (Addendum)
I am coming on board today to cover preop. Per Dr. Debara Pickett, his reply below will suffice for preop clearance. I do not see any active clearances in the chart from [facility] for which we can copy/paste this onto. Will route to callback to assist with getting this entered (alternatively OK to give verbal to them if that would suffice). Will otherwise remove from preop APP box until this is received.

## 2022-06-21 NOTE — Telephone Encounter (Signed)
Call placed to [facility], spoke with Mammie Lorenzo, LPN, she has taken the verbal and will pull the written out of Epic.

## 2022-06-25 ENCOUNTER — Encounter: Payer: Self-pay | Admitting: Student

## 2022-06-25 ENCOUNTER — Ambulatory Visit: Payer: No Typology Code available for payment source | Attending: Student | Admitting: Student

## 2022-06-25 VITALS — BP 118/72 | HR 67 | Ht 72.0 in | Wt 208.0 lb

## 2022-06-25 DIAGNOSIS — Z95810 Presence of automatic (implantable) cardiac defibrillator: Secondary | ICD-10-CM | POA: Diagnosis not present

## 2022-06-25 DIAGNOSIS — I2510 Atherosclerotic heart disease of native coronary artery without angina pectoris: Secondary | ICD-10-CM

## 2022-06-25 DIAGNOSIS — I255 Ischemic cardiomyopathy: Secondary | ICD-10-CM | POA: Diagnosis not present

## 2022-06-25 LAB — CUP PACEART INCLINIC DEVICE CHECK
Battery Remaining Longevity: 96 mo
Brady Statistic RV Percent Paced: 0.02 %
Date Time Interrogation Session: 20230925124428
HighPow Impedance: 78.75 Ohm
Implantable Lead Implant Date: 20210318
Implantable Lead Location: 753860
Implantable Pulse Generator Implant Date: 20210318
Lead Channel Impedance Value: 475 Ohm
Lead Channel Pacing Threshold Amplitude: 0.75 V
Lead Channel Pacing Threshold Amplitude: 0.75 V
Lead Channel Pacing Threshold Pulse Width: 0.4 ms
Lead Channel Pacing Threshold Pulse Width: 0.4 ms
Lead Channel Sensing Intrinsic Amplitude: 12 mV
Lead Channel Setting Pacing Amplitude: 2 V
Lead Channel Setting Pacing Pulse Width: 0.4 ms
Lead Channel Setting Sensing Sensitivity: 0.5 mV
Pulse Gen Serial Number: 111013899

## 2022-06-25 NOTE — Patient Instructions (Signed)
Medication Instructions:  Your physician recommends that you continue on your current medications as directed. Please refer to the Current Medication list given to you today.  *If you need a refill on your cardiac medications before your next appointment, please call your pharmacy*   Lab Work: None If you have labs (blood work) drawn today and your tests are completely normal, you will receive your results only by: MyChart Message (if you have MyChart) OR A paper copy in the mail If you have any lab test that is abnormal or we need to change your treatment, we will call you to review the results.   Follow-Up: At [facility], you and your health needs are our priority.  As part of our continuing mission to provide you with exceptional heart care, we have created designated Provider Care Teams.  These Care Teams include your primary Cardiologist (physician) and Advanced Practice Providers (APPs -  Physician Assistants and Nurse Practitioners) who all work together to provide you with the care you need, when you need it.  Your next appointment:   6 month(s)  The format for your next appointment:   In Person  Provider:   Michael "Andy" Tillery, PA-C  

## 2022-07-26 ENCOUNTER — Ambulatory Visit: Payer: Self-pay | Admitting: General Surgery

## 2022-08-07 ENCOUNTER — Encounter (HOSPITAL_COMMUNITY): Admission: RE | Admit: 2022-08-07 | Payer: No Typology Code available for payment source | Source: Ambulatory Visit

## 2022-08-14 ENCOUNTER — Ambulatory Visit: Payer: Self-pay | Admitting: General Surgery

## 2022-08-16 ENCOUNTER — Ambulatory Visit (HOSPITAL_COMMUNITY)
Admission: RE | Admit: 2022-08-16 | Payer: No Typology Code available for payment source | Source: Ambulatory Visit | Admitting: General Surgery

## 2022-08-16 ENCOUNTER — Encounter (HOSPITAL_COMMUNITY): Admission: RE | Payer: Self-pay | Source: Ambulatory Visit

## 2022-08-16 SURGERY — LAPAROSCOPIC CHOLECYSTECTOMY
Anesthesia: General

## 2022-09-19 ENCOUNTER — Telehealth: Payer: Self-pay | Admitting: Internal Medicine

## 2022-09-19 NOTE — Telephone Encounter (Signed)
Called patient to schedule follow up appointment with Dr. Rennis Golden. He says he is just being seen by the [place] now.

## 2022-12-26 ENCOUNTER — Telehealth (HOSPITAL_COMMUNITY): Payer: Self-pay

## 2022-12-26 NOTE — Telephone Encounter (Signed)
Called patient to see if he was interested in participating in the Cardiac Rehab Program. Patient stated yes. Patient will come in for orientation on 01/01/23 @ 930AM and will attend the 1015AM exercise class.   Tourist information centre manager.

## 2023-01-01 ENCOUNTER — Encounter (HOSPITAL_COMMUNITY): Payer: Self-pay

## 2023-01-01 ENCOUNTER — Encounter (HOSPITAL_COMMUNITY)
Admission: RE | Admit: 2023-01-01 | Discharge: 2023-01-01 | Disposition: A | Discharge status: Home / Self Care | Payer: No Typology Code available for payment source | Source: Ambulatory Visit | Attending: Cardiology | Admitting: Cardiology

## 2023-01-01 VITALS — BP 118/76 | HR 68 | Ht 72.0 in | Wt 210.1 lb

## 2023-01-01 DIAGNOSIS — Z5189 Encounter for other specified aftercare: Secondary | ICD-10-CM | POA: Insufficient documentation

## 2023-01-01 DIAGNOSIS — I255 Ischemic cardiomyopathy: Secondary | ICD-10-CM | POA: Diagnosis present

## 2023-01-01 NOTE — Progress Notes (Signed)
Cardiac Individual Treatment Plan  Patient Details  Name: Deshawn Broom MRN: [id number] Date of Birth: 1961-03-13 Referring Provider:   Flowsheet Row INTENSIVE CARDIAC REHAB ORIENT from 01/01/2023 in [facility]  Referring Provider Talbot Grumbling, MD  Fransico Him, MD covering]       Initial Encounter Date:  [place] from 01/01/2023 in [facility]  Date 01/01/23       Visit Diagnosis: Ischemic cardiomyopathy  Patient's Home Medications on Admission:  Current Outpatient Medications:    aspirin EC 81 MG EC tablet, Take 1 tablet (81 mg total) by mouth daily., Disp: 30 tablet, Rfl: 11   atorvastatin (LIPITOR) 80 MG tablet, TAKE 1 TABLET BY MOUTH EVERY DAY, Disp: 90 tablet, Rfl: 2   carvedilol (COREG) 3.125 MG tablet, TAKE 1 TABLET BY MOUTH TWICE A DAY WITH MEALS, Disp: 180 tablet, Rfl: 3   clopidogrel (PLAVIX) 75 MG tablet, TAKE 1 TABLET BY MOUTH EVERY DAY, Disp: 90 tablet, Rfl: 0   ENTRESTO 24-26 MG, TAKE 1 TABLET BY MOUTH TWICE A DAY, Disp: 60 tablet, Rfl: 11   isosorbide mononitrate (IMDUR) 30 MG 24 hr tablet, 1/2 tablet in the morning, Disp: , Rfl:    nitroGLYCERIN (NITROSTAT) 0.4 MG SL tablet, Place 1 tablet (0.4 mg total) under the tongue every 5 (five) minutes x 3 doses as needed for chest pain., Disp: 25 tablet, Rfl: 12   pantoprazole (PROTONIX) 40 MG tablet, TAKE 1 TABLET BY MOUTH EVERY DAY, Disp: 90 tablet, Rfl: 3   spironolactone (ALDACTONE) 25 MG tablet, TAKE 1/2 TABLET BY MOUTH EVERY DAY, Disp: 45 tablet, Rfl: 4  Past Medical History: Past Medical History:  Diagnosis Date   AICD (automatic cardioverter/defibrillator) present    [employer] ([employer]) [place] ICD   CAD (coronary artery disease), 12/29/13 PCI/DES LCX and PCI/DES LAD with overlapping DES  12/30/2013   cath 07/05/14 OK   CHF (congestive heart failure)     Dyslipidemia, goal LDL below 70 12/30/2013   GERD (gastroesophageal reflux disease)    Metabolic syndrome, 0000000 6.0  12/30/2013   Myocardial infarction 2015,2020   Sleep apnea    by history - patient denies this dx as of 08/01/20   Smoker    Wears partial dentures    upper    Tobacco Use: Social History   Tobacco Use  Smoking Status Every Day   Packs/day: 0.50   Years: 50.00   Additional pack years: 0.00   Total pack years: 25.00   Types: Cigarettes  Smokeless Tobacco Never    Labs: Review Flowsheet  More data exists      Latest Ref Rng & Units 05/29/2018 01/31/2019 05/19/2019 04/20/2020 08/02/2020  Labs for ITP Cardiac and Pulmonary Rehab  Cholestrol 100 - 199 mg/dL 188  - 114  117  -  LDL (calc) 0 - 99 mg/dL 132  - 60  67  -  HDL-C >39 mg/dL 27  - 28  32  -  Trlycerides 0 - 149 mg/dL 144  - 128  95  -  Hemoglobin A1c 4.8 - 5.6 % - - 5.8  - -  PH, Arterial 7.350 - 7.450 - 7.149  - - -  PCO2 arterial 32.0 - 48.0 mmHg - 47.6  - - -  Bicarbonate 20.0 - 28.0 mmol/L - 16.5  - - -  TCO2 22 - 32 mmol/L - 18  - -  24   Acid-base deficit 0.0 - 2.0 mmol/L - 12.0  - - -  O2 Saturation % - 81.0  - - -    Capillary Blood Glucose: Lab Results  Component Value Date   GLUCAP 193 (H) 08/02/2020   GLUCAP 181 (H) 08/02/2020   GLUCAP 130 (H) 08/02/2020     Exercise Target Goals: Exercise Program Goal: Individual exercise prescription set using results from initial 6 min walk test and THRR while considering  patient's activity barriers and safety.   Exercise Prescription Goal: Initial exercise prescription builds to 30-45 minutes a day of aerobic activity, 2-3 days per week.  Home exercise guidelines will be given to patient during program as part of exercise prescription that the participant will acknowledge.  Activity Barriers & Risk Stratification:  Activity Barriers & Cardiac Risk Stratification - 01/01/23 0937       Activity Barriers & Cardiac Risk Stratification   Activity  Barriers None    Cardiac Risk Stratification High             6 Minute Walk:  6 Minute Walk     Row Name 01/01/23 1045         6 Minute Walk   Phase Initial     Distance 1444 feet     Walk Time 6 minutes     # of Rest Breaks 0     MPH 2.73     METS 3.28     RPE 9     Perceived Dyspnea  0.5     VO2 Peak 11.47     Symptoms Yes (comment)     Comments Mild shortness of breath.     Resting HR 68 bpm     Resting BP 118/76     Resting Oxygen Saturation  97 %     Exercise Oxygen Saturation  during 6 min walk 98 %     Max Ex. HR 86 bpm     Max Ex. BP 110/72     2 Minute Post BP 118/66              Oxygen Initial Assessment:   Oxygen Re-Evaluation:   Oxygen Discharge (Final Oxygen Re-Evaluation):   Initial Exercise Prescription:  Initial Exercise Prescription - 01/01/23 1200       Date of Initial Exercise RX and Referring Provider   Date 01/01/23    Referring Provider Carlis Stable Ang, MD   Fransico Him, MD covering   Expected Discharge Date 03/22/23      Treadmill   MPH 1.7    Grade 0    Minutes 15    METs 2.3      NuStep   Level 2    SPM 85    Minutes 15    METs 2.3      Prescription Details   Frequency (times per week) 3    Duration Progress to 30 minutes of continuous aerobic without signs/symptoms of physical distress      Intensity   THRR 40-80% of Max Heartrate 63-126    Ratings of Perceived Exertion 11-13    Perceived Dyspnea 0-4      Progression   Progression Continue to progress workloads to maintain intensity without signs/symptoms of physical distress.      Resistance Training   Training Prescription Yes    Weight 4 lbs    Reps 10-15             Perform Capillary Blood Glucose checks as needed.  Exercise Prescription Changes:   Exercise Comments:   Exercise Goals and Review:   Exercise Goals     Row Name 01/01/23 0946             Exercise Goals   Increase Physical Activity Yes       Intervention  Provide advice, education, support and counseling about physical activity/exercise needs.;Develop an individualized exercise prescription for aerobic and resistive training based on initial evaluation findings, risk stratification, comorbidities and participant's personal goals.       Expected Outcomes Short Term: Attend rehab on a regular basis to increase amount of physical activity.;Long Term: Add in home exercise to make exercise part of routine and to increase amount of physical activity.;Long Term: Exercising regularly at least 3-5 days a week.       Increase Strength and Stamina Yes       Intervention Provide advice, education, support and counseling about physical activity/exercise needs.;Develop an individualized exercise prescription for aerobic and resistive training based on initial evaluation findings, risk stratification, comorbidities and participant's personal goals.       Expected Outcomes Short Term: Increase workloads from initial exercise prescription for resistance, speed, and METs.;Short Term: Perform resistance training exercises routinely during rehab and add in resistance training at home;Long Term: Improve cardiorespiratory fitness, muscular endurance and strength as measured by increased METs and functional capacity (6MWT)       Able to understand and use rate of perceived exertion (RPE) scale Yes       Intervention Provide education and explanation on how to use RPE scale       Expected Outcomes Short Term: Able to use RPE daily in rehab to express subjective intensity level;Long Term:  Able to use RPE to guide intensity level when exercising independently       Knowledge and understanding of Target Heart Rate Range (THRR) Yes       Intervention Provide education and explanation of THRR including how the numbers were predicted and where they are located for reference       Expected Outcomes Short Term: Able to state/look up THRR;Long Term: Able to use THRR to govern intensity  when exercising independently;Short Term: Able to use daily as guideline for intensity in rehab       Able to check pulse independently Yes       Intervention Provide education and demonstration on how to check pulse in carotid and radial arteries.;Review the importance of being able to check your own pulse for safety during independent exercise       Expected Outcomes Short Term: Able to explain why pulse checking is important during independent exercise;Long Term: Able to check pulse independently and accurately       Understanding of Exercise Prescription Yes       Intervention Provide education, explanation, and written materials on patient's individual exercise prescription       Expected Outcomes Short Term: Able to explain program exercise prescription;Long Term: Able to explain home exercise prescription to exercise independently                Exercise Goals Re-Evaluation :   Discharge Exercise Prescription (Final Exercise Prescription Changes):   Nutrition:  Target Goals: Understanding of nutrition guidelines, daily intake of sodium 1500mg , cholesterol 200mg , calories 30% from fat and 7% or less from saturated fats, daily to have 5 or more servings of fruits and vegetables.  Biometrics:  Pre Biometrics - 01/01/23 0919       Pre Biometrics  Waist Circumference 44.25 inches    Hip Circumference 43.75 inches    Waist to Hip Ratio 1.01 %    Triceps Skinfold 22 mm    % Body Fat 30.9 %    Grip Strength 40 kg    Flexibility 10.88 in    Single Leg Stand 13.93 seconds              Nutrition Therapy Plan and Nutrition Goals:   Nutrition Assessments:  MEDIFICTS Score Key: ?70 Need to make dietary changes  40-70 Heart Healthy Diet ? 40 Therapeutic Level Cholesterol Diet    Picture Your Plate Scores: [id number] Unhealthy dietary pattern with much room for improvement. 41-50 Dietary pattern unlikely to meet recommendations for good health and room for  improvement. 51-60 More healthful dietary pattern, with some room for improvement.  >60 Healthy dietary pattern, although there may be some specific behaviors that could be improved.    Nutrition Goals Re-Evaluation:   Nutrition Goals Re-Evaluation:   Nutrition Goals Discharge (Final Nutrition Goals Re-Evaluation):   Psychosocial: Target Goals: Acknowledge presence or absence of significant depression and/or stress, maximize coping skills, provide positive support system. Participant is able to verbalize types and ability to use techniques and skills needed for reducing stress and depression.  Initial Review & Psychosocial Screening:  Initial Psych Review & Screening - 01/01/23 0954       Initial Review   Current issues with Current Sleep Concerns   Patient states he sleeps about 4 hours and then wakes up and has difficulty getting back to sleep, thinking a lot.     Family Dynamics   Good Support System? Yes    Comments Patient has a very supportive wife and family, children.      Barriers   Psychosocial barriers to participate in program The patient should benefit from training in stress management and relaxation.      Screening Interventions   Interventions Encouraged to exercise;To provide support and resources with identified psychosocial needs;Provide feedback about the scores to participant    Expected Outcomes Short Term goal: Identification and review with participant of any Quality of Life or Depression concerns found by scoring the questionnaire.;Long Term goal: The participant improves quality of Life and PHQ9 Scores as seen by post scores and/or verbalization of changes             Quality of Life Scores:  Quality of Life - 01/01/23 1247       Quality of Life   Select Quality of Life      Quality of Life Scores   Health/Function Pre 18.6 %    Socioeconomic Pre 23.79 %    Psych/Spiritual Pre 21.43 %    Family Pre 22.5 %    GLOBAL Pre 20.82 %             Scores of 19 and below usually indicate a poorer quality of life in these areas.  A difference of  2-3 points is a clinically meaningful difference.  A difference of 2-3 points in the total score of the Quality of Life Index has been associated with significant improvement in overall quality of life, self-image, physical symptoms, and general health in studies assessing change in quality of life.  PHQ-9: Review Flowsheet       01/01/2023 03/22/2014  Depression screen PHQ 2/9  Decreased Interest 2 0  Down, Depressed, Hopeless 2 0  PHQ - 2 Score 4 0  Altered sleeping 2 -  Tired, decreased energy 2 -  Change in appetite 2 -  Feeling bad or failure about yourself  2 -  Trouble concentrating 0 -  Moving slowly or fidgety/restless 0 -  Suicidal thoughts 0 -  PHQ-9 Score 12 -  Difficult doing work/chores Somewhat difficult -   Interpretation of Total Score  Total Score Depression Severity:  1-4 = Minimal depression, 5-9 = Mild depression, 10-14 = Moderate depression, 15-19 = Moderately severe depression, 20-27 = Severe depression   Psychosocial Evaluation and Intervention:   Psychosocial Re-Evaluation:   Psychosocial Discharge (Final Psychosocial Re-Evaluation):   Vocational Rehabilitation: Provide vocational rehab assistance to qualifying candidates.   Vocational Rehab Evaluation & Intervention:  Vocational Rehab - 01/01/23 1249       Initial Vocational Rehab Evaluation & Intervention   Assessment shows need for Vocational Rehabilitation No      Vocational Rehab Re-Evaulation   Comments Patient on disability, no need for vocational rehab.             Education: Education Goals: Education classes will be provided on a weekly basis, covering required topics. Participant will state understanding/return demonstration of topics presented.     Core Videos: Exercise    Move It!  Clinical staff conducted group or individual video education with verbal and written  material and guidebook.  Patient learns the recommended Pritikin exercise program. Exercise with the goal of living a long, healthy life. Some of the health benefits of exercise include controlled diabetes, healthier blood pressure levels, improved cholesterol levels, improved heart and lung capacity, improved sleep, and better body composition. Everyone should speak with their doctor before starting or changing an exercise routine.  Biomechanical Limitations Clinical staff conducted group or individual video education with verbal and written material and guidebook.  Patient learns how biomechanical limitations can impact exercise and how we can mitigate and possibly overcome limitations to have an impactful and balanced exercise routine.  Body Composition Clinical staff conducted group or individual video education with verbal and written material and guidebook.  Patient learns that body composition (ratio of muscle mass to fat mass) is a key component to assessing overall fitness, rather than body weight alone. Increased fat mass, especially visceral belly fat, can put [place] at increased risk for metabolic syndrome, type 2 diabetes, heart disease, and even death. It is recommended to combine diet and exercise (cardiovascular and resistance training) to improve your body composition. Seek guidance from your physician and exercise physiologist before implementing an exercise routine.  Exercise Action Plan Clinical staff conducted group or individual video education with verbal and written material and guidebook.  Patient learns the recommended strategies to achieve and enjoy long-term exercise adherence, including variety, self-motivation, self-efficacy, and positive decision making. Benefits of exercise include fitness, good health, weight management, more energy, better sleep, less stress, and overall well-being.  Medical   Heart Disease Risk Reduction Clinical staff conducted group or individual  video education with verbal and written material and guidebook.  Patient learns our heart is our most vital organ as it circulates oxygen, nutrients, white blood cells, and hormones throughout the entire body, and carries waste away. Data supports a plant-based eating plan like the Pritikin Program for its effectiveness in slowing progression of and reversing heart disease. The video provides a number of recommendations to address heart disease.   Metabolic Syndrome and Belly Fat  Clinical staff conducted group or individual video education with verbal and written material and guidebook.  Patient learns what metabolic syndrome is, how it leads to heart  disease, and how one can reverse it and keep it from coming back. You have metabolic syndrome if you have 3 of the following 5 criteria: abdominal obesity, high blood pressure, high triglycerides, low HDL cholesterol, and high blood sugar.  Hypertension and Heart Disease Clinical staff conducted group or individual video education with verbal and written material and guidebook.  Patient learns that high blood pressure, or hypertension, is very common in the [place]. Hypertension is largely due to excessive salt intake, but other important risk factors include being overweight, physical inactivity, drinking too much alcohol, smoking, and not eating enough potassium from fruits and vegetables. High blood pressure is a leading risk factor for heart attack, stroke, congestive heart failure, dementia, kidney failure, and premature death. Long-term effects of excessive salt intake include stiffening of the arteries and thickening of heart muscle and organ damage. Recommendations include ways to reduce hypertension and the risk of heart disease.  Diseases of Our Time - Focusing on Diabetes Clinical staff conducted group or individual video education with verbal and written material and guidebook.  Patient learns why the best way to stop diseases of our  time is prevention, through food and other lifestyle changes. Medicine (such as prescription pills and surgeries) is often only a Band-Aid on the problem, not a long-term solution. Most common diseases of our time include obesity, type 2 diabetes, hypertension, heart disease, and cancer. The Pritikin Program is recommended and has been proven to help reduce, reverse, and/or prevent the damaging effects of metabolic syndrome.  Nutrition   Overview of the Pritikin Eating Plan  Clinical staff conducted group or individual video education with verbal and written material and guidebook.  Patient learns about the [place] for disease risk reduction. The [place] emphasizes a wide variety of unrefined, minimally-processed carbohydrates, like fruits, vegetables, whole grains, and legumes. Go, Caution, and Stop food choices are explained. Plant-based and lean animal proteins are emphasized. Rationale provided for low sodium intake for blood pressure control, low added sugars for blood sugar stabilization, and low added fats and oils for coronary artery disease risk reduction and weight management.  Calorie Density  Clinical staff conducted group or individual video education with verbal and written material and guidebook.  Patient learns about calorie density and how it impacts the Pritikin Eating Plan. Knowing the characteristics of the food you choose will help you decide whether those foods will lead to weight gain or weight loss, and whether you want to consume more or less of them. Weight loss is usually a side effect of the Pritikin Eating Plan because of its focus on low calorie-dense foods.  Label Reading  Clinical staff conducted group or individual video education with verbal and written material and guidebook.  Patient learns about the Pritikin recommended label reading guidelines and corresponding recommendations regarding calorie density, added sugars, sodium content, and  whole grains.  Dining Out - Part 1  Clinical staff conducted group or individual video education with verbal and written material and guidebook.  Patient learns that restaurant meals can be sabotaging because they can be so high in calories, fat, sodium, and/or sugar. Patient learns recommended strategies on how to positively address this and avoid unhealthy pitfalls.  Facts on Fats  Clinical staff conducted group or individual video education with verbal and written material and guidebook.  Patient learns that lifestyle modifications can be just as effective, if not more so, as many medications for lowering your risk of heart disease. A Pritikin  lifestyle can help to reduce your risk of inflammation and atherosclerosis (cholesterol build-up, or plaque, in the artery walls). Lifestyle interventions such as dietary choices and physical activity address the cause of atherosclerosis. A review of the types of fats and their impact on blood cholesterol levels, along with dietary recommendations to reduce fat intake is also included.  Nutrition Action Plan  Clinical staff conducted group or individual video education with verbal and written material and guidebook.  Patient learns how to incorporate Pritikin recommendations into their lifestyle. Recommendations include planning and keeping personal health goals in mind as an important part of their success.  Healthy Mind-Set    Healthy Minds, Bodies, Hearts  Clinical staff conducted group or individual video education with verbal and written material and guidebook.  Patient learns how to identify when they are stressed. Video will discuss the impact of that stress, as well as the many benefits of stress management. Patient will also be introduced to stress management techniques. The way we think, act, and feel has an impact on our hearts.  How Our Thoughts Can Heal Our Hearts  Clinical staff conducted group or individual video education with verbal and  written material and guidebook.  Patient learns that negative thoughts can cause depression and anxiety. This can result in negative lifestyle behavior and serious health problems. Cognitive behavioral therapy is an effective method to help control our thoughts in order to change and improve our emotional outlook.  Additional Videos:  Exercise    Improving Performance  Clinical staff conducted group or individual video education with verbal and written material and guidebook.  Patient learns to use a non-linear approach by alternating intensity levels and lengths of time spent exercising to help burn more calories and lose more body fat. Cardiovascular exercise helps improve heart health, metabolism, hormonal balance, blood sugar control, and recovery from fatigue. Resistance training improves strength, endurance, balance, coordination, reaction time, metabolism, and muscle mass. Flexibility exercise improves circulation, posture, and balance. Seek guidance from your physician and exercise physiologist before implementing an exercise routine and learn your capabilities and proper form for all exercise.  Introduction to Yoga  Clinical staff conducted group or individual video education with verbal and written material and guidebook.  Patient learns about yoga, a discipline of the coming together of mind, breath, and body. The benefits of yoga include improved flexibility, improved range of motion, better posture and core strength, increased lung function, weight loss, and positive self-image. Yoga's heart health benefits include lowered blood pressure, healthier heart rate, decreased cholesterol and triglyceride levels, improved immune function, and reduced stress. Seek guidance from your physician and exercise physiologist before implementing an exercise routine and learn your capabilities and proper form for all exercise.  Medical   Aging: Enhancing Your Quality of Life  Clinical staff conducted  group or individual video education with verbal and written material and guidebook.  Patient learns key strategies and recommendations to stay in good physical health and enhance quality of life, such as prevention strategies, having an advocate, securing a [place], and keeping a list of medications and system for tracking them. It also discusses how to avoid risk for bone loss.  Biology of Weight Control  Clinical staff conducted group or individual video education with verbal and written material and guidebook.  Patient learns that weight gain occurs because we consume more calories than we burn (eating more, moving less). Even if your body weight is normal, you may have higher  ratios of fat compared to muscle mass. Too much body fat puts you at increased risk for cardiovascular disease, heart attack, stroke, type 2 diabetes, and obesity-related cancers. In addition to exercise, following the [place] can help reduce your risk.  Decoding Lab Results  Clinical staff conducted group or individual video education with verbal and written material and guidebook.  Patient learns that lab test reflects one measurement whose values change over time and are influenced by many factors, including medication, stress, sleep, exercise, food, hydration, pre-existing medical conditions, and more. It is recommended to use the knowledge from this video to become more involved with your lab results and evaluate your numbers to speak with your doctor.   Diseases of Our Time - Overview  Clinical staff conducted group or individual video education with verbal and written material and guidebook.  Patient learns that according to the CDC, 50% to 70% of chronic diseases (such as obesity, type 2 diabetes, elevated lipids, hypertension, and heart disease) are avoidable through lifestyle improvements including healthier food choices, listening to satiety cues, and increased physical  activity.  Sleep Disorders Clinical staff conducted group or individual video education with verbal and written material and guidebook.  Patient learns how good quality and duration of sleep are important to overall health and well-being. Patient also learns about sleep disorders and how they impact health along with recommendations to address them, including discussing with a physician.  Nutrition  Dining Out - Part 2 Clinical staff conducted group or individual video education with verbal and written material and guidebook.  Patient learns how to plan ahead and communicate in order to maximize their dining experience in a healthy and nutritious manner. Included are recommended food choices based on the type of restaurant the patient is visiting.   Fueling a Best boy conducted group or individual video education with verbal and written material and guidebook.  There is a strong connection between our food choices and our health. Diseases like obesity and type 2 diabetes are very prevalent and are in large-part due to lifestyle choices. The Pritikin Eating Plan provides plenty of food and hunger-curbing satisfaction. It is easy to follow, affordable, and helps reduce health risks.  Menu Workshop  Clinical staff conducted group or individual video education with verbal and written material and guidebook.  Patient learns that restaurant meals can sabotage health goals because they are often packed with calories, fat, sodium, and sugar. Recommendations include strategies to plan ahead and to communicate with the manager, chef, or server to help order a healthier meal.  Planning Your Eating Strategy  Clinical staff conducted group or individual video education with verbal and written material and guidebook.  Patient learns about the [place] and its benefit of reducing the risk of disease. The [place] does not focus on calories. Instead, it emphasizes  high-quality, nutrient-rich foods. By knowing the characteristics of the foods, we choose, we can determine their calorie density and make informed decisions.  Targeting Your Nutrition Priorities  Clinical staff conducted group or individual video education with verbal and written material and guidebook.  Patient learns that lifestyle habits have a tremendous impact on disease risk and progression. This video provides eating and physical activity recommendations based on your personal health goals, such as reducing LDL cholesterol, losing weight, preventing or controlling type 2 diabetes, and reducing high blood pressure.  Vitamins and Minerals  Clinical staff conducted group or individual video education with verbal and written  material and guidebook.  Patient learns different ways to obtain key vitamins and minerals, including through a recommended healthy diet. It is important to discuss all supplements you take with your doctor.   Healthy Mind-Set    Smoking Cessation  Clinical staff conducted group or individual video education with verbal and written material and guidebook.  Patient learns that cigarette smoking and tobacco addiction pose a serious health risk which affects millions of people. Stopping smoking will significantly reduce the risk of heart disease, lung disease, and many forms of cancer. Recommended strategies for quitting are covered, including working with your doctor to develop a successful plan.  Culinary   Becoming a Financial trader conducted group or individual video education with verbal and written material and guidebook.  Patient learns that cooking at home can be healthy, cost-effective, quick, and puts them in control. Keys to cooking healthy recipes will include looking at your recipe, assessing your equipment needs, planning ahead, making it simple, choosing cost-effective seasonal ingredients, and limiting the use of added fats, salts, and  sugars.  Cooking - Breakfast and Snacks  Clinical staff conducted group or individual video education with verbal and written material and guidebook.  Patient learns how important breakfast is to satiety and nutrition through the entire day. Recommendations include key foods to eat during breakfast to help stabilize blood sugar levels and to prevent overeating at meals later in the day. Planning ahead is also a key component.  Cooking - Human resources officer conducted group or individual video education with verbal and written material and guidebook.  Patient learns eating strategies to improve overall health, including an approach to cook more at home. Recommendations include thinking of animal protein as a side on your plate rather than center stage and focusing instead on lower calorie dense options like vegetables, fruits, whole grains, and plant-based proteins, such as beans. Making sauces in large quantities to freeze for later and leaving the skin on your vegetables are also recommended to maximize your experience.  Cooking - Healthy Salads and Dressing Clinical staff conducted group or individual video education with verbal and written material and guidebook.  Patient learns that vegetables, fruits, whole grains, and legumes are the foundations of the [place]. Recommendations include how to incorporate each of these in flavorful and healthy salads, and how to create homemade salad dressings. Proper handling of ingredients is also covered. Cooking - Soups and [employer] - Soups and Desserts Clinical staff conducted group or individual video education with verbal and written material and guidebook.  Patient learns that Pritikin soups and desserts make for easy, nutritious, and delicious snacks and meal components that are low in sodium, fat, sugar, and calorie density, while high in vitamins, minerals, and filling fiber. Recommendations include simple and healthy  ideas for soups and desserts.   Overview     The Pritikin Solution Program Overview Clinical staff conducted group or individual video education with verbal and written material and guidebook.  Patient learns that the results of the [place] Program have been documented in more than 100 articles published in peer-reviewed journals, and the benefits include reducing risk factors for (and, in some cases, even reversing) high cholesterol, high blood pressure, type 2 diabetes, obesity, and more! An overview of the three key pillars of the Pritikin Program will be covered: eating well, doing regular exercise, and having a healthy mind-set.  WORKSHOPS  Exercise: Exercise Basics: Building Your Action Plan Clinical staff  led group instruction and group discussion with PowerPoint presentation and patient guidebook. To enhance the learning environment the use of posters, models and videos may be added. At the conclusion of this workshop, patients will comprehend the difference between physical activity and exercise, as well as the benefits of incorporating both, into their routine. Patients will understand the FITT (Frequency, Intensity, Time, and Type) principle and how to use it to build an exercise action plan. In addition, safety concerns and other considerations for exercise and cardiac rehab will be addressed by the presenter. The purpose of this lesson is to promote a comprehensive and effective weekly exercise routine in order to improve patients' overall level of fitness.   Managing Heart Disease: Your Path to a Healthier Heart Clinical staff led group instruction and group discussion with PowerPoint presentation and patient guidebook. To enhance the learning environment the use of posters, models and videos may be added.At the conclusion of this workshop, patients will understand the anatomy and physiology of the heart. Additionally, they will understand how Pritikin's three pillars impact the  risk factors, the progression, and the management of heart disease.  The purpose of this lesson is to provide a high-level overview of the heart, heart disease, and how the Pritikin lifestyle positively impacts risk factors.  Exercise Biomechanics Clinical staff led group instruction and group discussion with PowerPoint presentation and patient guidebook. To enhance the learning environment the use of posters, models and videos may be added. Patients will learn how the structural parts of their bodies function and how these functions impact their daily activities, movement, and exercise. Patients will learn how to promote a neutral spine, learn how to manage pain, and identify ways to improve their physical movement in order to promote healthy living. The purpose of this lesson is to expose patients to common physical limitations that impact physical activity. Participants will learn practical ways to adapt and manage aches and pains, and to minimize their effect on regular exercise. Patients will learn how to maintain good posture while sitting, walking, and lifting.  Balance Training and Fall Prevention  Clinical staff led group instruction and group discussion with PowerPoint presentation and patient guidebook. To enhance the learning environment the use of posters, models and videos may be added. At the conclusion of this workshop, patients will understand the importance of their sensorimotor skills (vision, proprioception, and the vestibular system) in maintaining their ability to balance as they age. Patients will apply a variety of balancing exercises that are appropriate for their current level of function. Patients will understand the common causes for poor balance, possible solutions to these problems, and ways to modify their physical environment in order to minimize their fall risk. The purpose of this lesson is to teach patients about the importance of maintaining balance as they age  and ways to minimize their risk of falling.  WORKSHOPS   Nutrition:  Fueling a Scientist, research (physical sciences) led group instruction and group discussion with PowerPoint presentation and patient guidebook. To enhance the learning environment the use of posters, models and videos may be added. Patients will review the foundational principles of the [place] and understand what constitutes a serving size in each of the food groups. Patients will also learn Pritikin-friendly foods that are better choices when away from home and review make-ahead meal and snack options. Calorie density will be reviewed and applied to three nutrition priorities: weight maintenance, weight loss, and weight gain. The purpose of this lesson is to reinforce (  in a group setting) the key concepts around what patients are recommended to eat and how to apply these guidelines when away from home by planning and selecting Pritikin-friendly options. Patients will understand how calorie density may be adjusted for different weight management goals.  Mindful Eating  Clinical staff led group instruction and group discussion with PowerPoint presentation and patient guidebook. To enhance the learning environment the use of posters, models and videos may be added. Patients will briefly review the concepts of the [place] and the importance of low-calorie dense foods. The concept of mindful eating will be introduced as well as the importance of paying attention to internal hunger signals. Triggers for non-hunger eating and techniques for dealing with triggers will be explored. The purpose of this lesson is to provide patients with the opportunity to review the basic principles of the [place], discuss the value of eating mindfully and how to measure internal cues of hunger and fullness using the Hunger Scale. Patients will also discuss reasons for non-hunger eating and learn strategies to use for controlling  emotional eating.  Targeting Your Nutrition Priorities Clinical staff led group instruction and group discussion with PowerPoint presentation and patient guidebook. To enhance the learning environment the use of posters, models and videos may be added. Patients will learn how to determine their genetic susceptibility to disease by reviewing their family history. Patients will gain insight into the importance of diet as part of an overall healthy lifestyle in mitigating the impact of genetics and other environmental insults. The purpose of this lesson is to provide patients with the opportunity to assess their personal nutrition priorities by looking at their family history, their own health history and current risk factors. Patients will also be able to discuss ways of prioritizing and modifying the [place] for their highest risk areas  Menu  Clinical staff led group instruction and group discussion with PowerPoint presentation and patient guidebook. To enhance the learning environment the use of posters, models and videos may be added. Using menus brought in from [employer], or printed from [employer], patients will apply the [place] dining out guidelines that were presented in the [employer] video. Patients will also be able to practice these guidelines in a variety of provided scenarios. The purpose of this lesson is to provide patients with the opportunity to practice hands-on learning of the [place] with actual menus and practice scenarios.  Label Reading Clinical staff led group instruction and group discussion with PowerPoint presentation and patient guidebook. To enhance the learning environment the use of posters, models and videos may be added. Patients will review and discuss the Pritikin label reading guidelines presented in Pritikin's Label Reading Educational series video. Using fool labels brought in from local grocery stores  and markets, patients will apply the label reading guidelines and determine if the packaged food meet the Pritikin guidelines. The purpose of this lesson is to provide patients with the opportunity to review, discuss, and practice hands-on learning of the Pritikin Label Reading guidelines with actual packaged food labels. [place] Workshops are designed to teach patients ways to prepare quick, simple, and affordable recipes at home. The importance of nutrition's role in chronic disease risk reduction is reflected in its emphasis in the overall Pritikin program. By learning how to prepare essential core Pritikin Eating Plan recipes, patients will increase control over what they eat; be able to customize the flavor of foods  without the use of added salt, sugar, or fat; and improve the quality of the food they consume. By learning a set of core recipes which are easily assembled, quickly prepared, and affordable, patients are more likely to prepare more healthy foods at home. These workshops focus on convenient breakfasts, simple entres, side dishes, and desserts which can be prepared with minimal effort and are consistent with nutrition recommendations for cardiovascular risk reduction. Cooking [employer] are taught by a Engineer, materials (RD) who has been trained by the [employer]. The chef or RD has a clear understanding of the importance of minimizing - if not completely eliminating - added fat, sugar, and sodium in recipes. Throughout the series of [place] Workshop sessions, patients will learn about healthy ingredients and efficient methods of cooking to build confidence in their capability to prepare    Cooking School weekly topics:  Adding Flavor- Sodium-Free  Fast and Healthy Breakfasts  Powerhouse Plant-Based Proteins  Satisfying Salads and Dressings  Simple Sides and Sauces  International Cuisine-Spotlight on the [place]  Zones  Delicious Desserts  Savory Soups  [employer] - Meals in a Agricultural consultant Appetizers and Snacks  Comforting Weekend Breakfasts  One-Pot Wonders   Fast Evening Meals  Contractor Your Pritikin Plate  WORKSHOPS   Healthy Mindset (Psychosocial):  Focused Goals, Sustainable Changes Clinical staff led group instruction and group discussion with PowerPoint presentation and patient guidebook. To enhance the learning environment the use of posters, models and videos may be added. Patients will be able to apply effective goal setting strategies to establish at least one personal goal, and then take consistent, meaningful action toward that goal. They will learn to identify common barriers to achieving personal goals and develop strategies to overcome them. Patients will also gain an understanding of how our mind-set can impact our ability to achieve goals and the importance of cultivating a positive and growth-oriented mind-set. The purpose of this lesson is to provide patients with a deeper understanding of how to set and achieve personal goals, as well as the tools and strategies needed to overcome common obstacles which may arise along the way.  From Head to Heart: The Power of a Healthy Outlook  Clinical staff led group instruction and group discussion with PowerPoint presentation and patient guidebook. To enhance the learning environment the use of posters, models and videos may be added. Patients will be able to recognize and describe the impact of emotions and mood on physical health. They will discover the importance of self-care and explore self-care practices which may work for them. Patients will also learn how to utilize the 4 C's to cultivate a healthier outlook and better manage stress and challenges. The purpose of this lesson is to demonstrate to patients how a healthy outlook is an essential part of maintaining good health, especially as they continue their  cardiac rehab journey.  Healthy Sleep for a Healthy Heart Clinical staff led group instruction and group discussion with PowerPoint presentation and patient guidebook. To enhance the learning environment the use of posters, models and videos may be added. At the conclusion of this workshop, patients will be able to demonstrate knowledge of the importance of sleep to overall health, well-being, and quality of life. They will understand the symptoms of, and treatments for, common sleep disorders. Patients will also be able to identify daytime and nighttime behaviors which impact sleep, and they will be able to apply these tools to help  manage sleep-related challenges. The purpose of this lesson is to provide patients with a general overview of sleep and outline the importance of quality sleep. Patients will learn about a few of the most common sleep disorders. Patients will also be introduced to the concept of "sleep hygiene," and discover ways to self-manage certain sleeping problems through simple daily behavior changes. Finally, the workshop will motivate patients by clarifying the links between quality sleep and their goals of heart-healthy living.   Recognizing and Reducing Stress Clinical staff led group instruction and group discussion with PowerPoint presentation and patient guidebook. To enhance the learning environment the use of posters, models and videos may be added. At the conclusion of this workshop, patients will be able to understand the types of stress reactions, differentiate between acute and chronic stress, and recognize the impact that chronic stress has on their health. They will also be able to apply different coping mechanisms, such as reframing negative self-talk. Patients will have the opportunity to practice a variety of stress management techniques, such as deep abdominal breathing, progressive muscle relaxation, and/or guided imagery.  The purpose of this lesson is to educate  patients on the role of stress in their lives and to provide healthy techniques for coping with it.  Learning Barriers/Preferences:  Learning Barriers/Preferences - 01/01/23 1015       Learning Barriers/Preferences   Learning Barriers None   Wears reading glasses   Learning Preferences Skilled Demonstration;Video             Education Topics:  Knowledge Questionnaire Score:  Knowledge Questionnaire Score - 01/01/23 1249       Knowledge Questionnaire Score   Pre Score 23/28             Core Components/Risk Factors/Patient Goals at Admission:  Personal Goals and Risk Factors at Admission - 01/01/23 0954       Core Components/Risk Factors/Patient Goals on Admission   Tobacco Cessation Yes    Number of packs per day 0.5    Intervention Assist the participant in steps to quit. Provide individualized education and counseling about committing to Tobacco Cessation, relapse prevention, and pharmacological support that can be provided by physician.;Advice worker, assist with locating and accessing local/national Quit Smoking programs, and support quit date choice.    Expected Outcomes Short Term: Will demonstrate readiness to quit, by selecting a quit date.;Long Term: Complete abstinence from all tobacco products for at least 12 months from quit date.;Short Term: Will quit all tobacco product use, adhering to prevention of relapse plan.    Hypertension Yes    Intervention Provide education on lifestyle modifcations including regular physical activity/exercise, weight management, moderate sodium restriction and increased consumption of fresh fruit, vegetables, and low fat dairy, alcohol moderation, and smoking cessation.;Monitor prescription use compliance.    Expected Outcomes Short Term: Continued assessment and intervention until BP is < 140/65mm HG in hypertensive participants. < 130/66mm HG in hypertensive participants with diabetes, heart failure or chronic kidney  disease.;Long Term: Maintenance of blood pressure at goal levels.    Lipids Yes    Intervention Provide education and support for participant on nutrition & aerobic/resistive exercise along with prescribed medications to achieve LDL 70mg , HDL >40mg .    Expected Outcomes Short Term: Participant states understanding of desired cholesterol values and is compliant with medications prescribed. Participant is following exercise prescription and nutrition guidelines.;Long Term: Cholesterol controlled with medications as prescribed, with individualized exercise RX and with personalized nutrition plan. Value goals: LDL < 70mg ,  HDL > 40 mg.    Personal Goal Other Yes    Personal Goal Increase motivation to be more active. Enjoy life. Feel good about himself.    Intervention Patient will participate in the intensive cardiac rehab program and attend workshops, video lectures on building an exercise plan and having a healthy mindset.    Expected Outcomes Patient will be more active,compliant with exericse plan and have a better out look as measured by self-report.             Core Components/Risk Factors/Patient Goals Review:    Core Components/Risk Factors/Patient Goals at Discharge (Final Review):    ITP Comments:  ITP Comments     Row Name 01/01/23 0919           ITP Comments Medical Director- Dr. Fransico Him, MD. Oriented patient to the [place] Education Program / Intensive Cardiac Rehab. Reviewed intitial orientation folder with patient.                Comments: Participant attended orientation for the cardiac rehabilitation program on  01/01/2023  to perform initial intake and exercise walk test. Patient introduced to the [place] education and orientation packet was reviewed. Completed 6-minute walk test, measurements, initial ITP, and exercise prescription. Vital signs stable. Telemetry-normal sinus rhythm with 1st degree AV block, asymptomatic.   Service time was from 920  to 1115.

## 2023-01-07 ENCOUNTER — Encounter (HOSPITAL_COMMUNITY)
Admission: RE | Admit: 2023-01-07 | Discharge: 2023-01-07 | Disposition: A | Discharge status: Home / Self Care | Payer: No Typology Code available for payment source | Source: Ambulatory Visit | Attending: Cardiology | Admitting: Cardiology

## 2023-01-07 DIAGNOSIS — I255 Ischemic cardiomyopathy: Secondary | ICD-10-CM

## 2023-01-07 NOTE — Progress Notes (Signed)
Daily Session Note  Patient Details  Name: Alan White MRN: [id number] Date of Birth: 02/09/1961 Referring Provider:   Flowsheet Row INTENSIVE CARDIAC REHAB ORIENT from 01/01/2023 in [facility]  Referring Provider [name], MD  Armanda Magic, MD covering]       Encounter Date: 01/07/2023  Check In:  Session Check In - 01/07/23 1151       Check-In   Supervising physician immediately available to respond to emergencies [facility] MD immediately available    Physician(s) Gwendolyn Lima, NP    Location [facility]    Staff Present Cristy Hilts, MS, ACSM-CEP, Exercise Physiologist;Suzanne Kho, RN, Marton Redwood, MS, ACSM-CEP, CCRP, Exercise Physiologist;Sarah Cleophas Dunker, RN, MSN;Johnny Hale Bogus, MS, Exercise Physiologist    Virtual Visit No    Medication changes reported     No    Fall or balance concerns reported    No    Tobacco Cessation No Change    Current number of cigarettes/nicotine per day     10    Warm-up and Cool-down Performed as group-led instruction    Resistance Training Performed Yes    VAD Patient? No    PAD/SET Patient? No      Pain Assessment   Currently in Pain? No/denies    Pain Score 0-No pain    Multiple Pain Sites No             Capillary Blood Glucose: No results found for this or any previous visit (from the past 24 hour(s)).   Exercise Prescription Changes - 01/07/23 1022       Response to Exercise   Blood Pressure (Admit) 102/70    Blood Pressure (Exercise) 122/72    Blood Pressure (Exit) 90/62    Heart Rate (Admit) 73 bpm    Heart Rate (Exercise) 106 bpm    Heart Rate (Exit) 78 bpm    Rating of Perceived Exertion (Exercise) 10    Symptoms None    Comments Off to an excellent start with exercise.    Duration Continue with 30 min of aerobic exercise without signs/symptoms of physical distress.    Intensity THRR unchanged      Progression    Progression Continue to progress workloads to maintain intensity without signs/symptoms of physical distress.    Average METs 3      Resistance Training   Training Prescription Yes    Weight 4 lbs    Reps 10-15    Time 10 Minutes      Interval Training   Interval Training No      Treadmill   MPH 2.5    Grade 0    Minutes 15    METs 2.91      NuStep   Level 4    SPM 88    Minutes 15    METs 3.2             Social History   Tobacco Use  Smoking Status Every Day   Packs/day: 0.50   Years: 50.00   Additional pack years: 0.00   Total pack years: 25.00   Types: Cigarettes  Smokeless Tobacco Never    Goals Met:  Exercise tolerated well No report of concerns or symptoms today Strength training completed today  Goals Unmet:  Not Applicable  Comments: Pt started cardiac rehab today.  Pt tolerated light exercise without difficulty. VSS, telemetry-Sinus rhythm, asymptomatic.  Medication list reconciled. Pt denies barriers to [employer]  compliance. Post systolic BP's 90/62. Patient asymptomatic. Will continue to monitor BP. PSYCHOSOCIAL ASSESSMENT:  PHQ-12.  Will review quality of life questionnaire in the upcoming.  Pt enjoys music and sports.   Pt oriented to exercise equipment and routine.    Understanding verbalized.Thayer Headings RN BSN    Dr. Armanda Magic is Medical Director for Cardiac Rehab at [facility].

## 2023-01-08 ENCOUNTER — Telehealth (HOSPITAL_COMMUNITY): Payer: Self-pay

## 2023-01-08 NOTE — Telephone Encounter (Signed)
Cala Bradford, RN from the [place] returned call to cardiac rehab. Reported to [facility] that yesterday, pt BP was 90/62 and was asymptomatic. Reviewed Coreg and Entresto with Cala Bradford. She stated that Dr. Jodie Echevaria said if patient is asymptomatic, he is ok with the low BP.

## 2023-01-09 ENCOUNTER — Encounter (HOSPITAL_COMMUNITY)
Admission: RE | Admit: 2023-01-09 | Discharge: 2023-01-09 | Discharge status: Home / Self Care | Payer: No Typology Code available for payment source | Source: Ambulatory Visit | Attending: Cardiology

## 2023-01-09 DIAGNOSIS — I255 Ischemic cardiomyopathy: Secondary | ICD-10-CM

## 2023-01-11 ENCOUNTER — Encounter (HOSPITAL_COMMUNITY): Payer: No Typology Code available for payment source

## 2023-01-14 ENCOUNTER — Encounter (HOSPITAL_COMMUNITY): Payer: No Typology Code available for payment source

## 2023-01-16 ENCOUNTER — Encounter (HOSPITAL_COMMUNITY)
Admission: RE | Admit: 2023-01-16 | Discharge: 2023-01-16 | Disposition: A | Discharge status: Home / Self Care | Payer: No Typology Code available for payment source | Source: Ambulatory Visit | Attending: Cardiology | Admitting: Cardiology

## 2023-01-16 DIAGNOSIS — I255 Ischemic cardiomyopathy: Secondary | ICD-10-CM | POA: Diagnosis not present

## 2023-01-18 ENCOUNTER — Encounter (HOSPITAL_COMMUNITY)
Admission: RE | Admit: 2023-01-18 | Discharge: 2023-01-18 | Disposition: A | Discharge status: Home / Self Care | Payer: No Typology Code available for payment source | Source: Ambulatory Visit | Attending: Cardiology

## 2023-01-18 DIAGNOSIS — I255 Ischemic cardiomyopathy: Secondary | ICD-10-CM

## 2023-01-18 NOTE — Progress Notes (Signed)
QUALITY OF LIFE SCORE REVIEW  Pt completed Quality of Life survey as a participant in Cardiac Rehab.  Scores 21.0 or below are considered low.  Pt score very low in several areas Overall 20.82, Health and Function 18.6, socioeconomic 23.79, physiological and spiritual 21.43, family 22.5. Patient quality of life slightly altered by physical constraints which limits ability to perform as prior to recent cardiac illness. Alan White report that he is experiencing less depression since he has been participating in intensive cardiac rehab. Alan White says he has more energy  Offered emotional support and reassurance.  Will continue to monitor and intervene as necessary.Thayer Headings RN BSN

## 2023-01-21 ENCOUNTER — Encounter (HOSPITAL_COMMUNITY)
Admission: RE | Admit: 2023-01-21 | Discharge: 2023-01-21 | Disposition: A | Discharge status: Home / Self Care | Payer: No Typology Code available for payment source | Source: Ambulatory Visit | Attending: Cardiology

## 2023-01-21 DIAGNOSIS — I255 Ischemic cardiomyopathy: Secondary | ICD-10-CM | POA: Diagnosis not present

## 2023-01-23 ENCOUNTER — Encounter (HOSPITAL_COMMUNITY)
Admission: RE | Admit: 2023-01-23 | Discharge: 2023-01-23 | Disposition: A | Discharge status: Home / Self Care | Payer: No Typology Code available for payment source | Source: Ambulatory Visit | Attending: Cardiology

## 2023-01-23 DIAGNOSIS — I255 Ischemic cardiomyopathy: Secondary | ICD-10-CM | POA: Diagnosis not present

## 2023-01-25 ENCOUNTER — Encounter (HOSPITAL_COMMUNITY)
Admission: RE | Admit: 2023-01-25 | Discharge: 2023-01-25 | Disposition: A | Discharge status: Home / Self Care | Payer: No Typology Code available for payment source | Source: Ambulatory Visit | Attending: Cardiology | Admitting: Cardiology

## 2023-01-25 DIAGNOSIS — I255 Ischemic cardiomyopathy: Secondary | ICD-10-CM | POA: Diagnosis not present

## 2023-01-28 ENCOUNTER — Encounter (HOSPITAL_COMMUNITY)
Admission: RE | Admit: 2023-01-28 | Discharge: 2023-01-28 | Disposition: A | Discharge status: Home / Self Care | Payer: No Typology Code available for payment source | Source: Ambulatory Visit | Attending: Cardiology

## 2023-01-28 DIAGNOSIS — I255 Ischemic cardiomyopathy: Secondary | ICD-10-CM

## 2023-01-28 NOTE — Progress Notes (Signed)
Reviewed home exercise guidelines with patient including endpoints, temperature precautions, target heart rate and rate of perceived exertion. Patient started walking on his treadmill at home, 3.o mph, no incline for 15 minutes as his mode of home exercise. Patient will walk 30 minutes, 2 days/week in addition to exercise at cardiac rehab. Patient voices understanding of instructions given.  Artist Pais, MS, ACSM CEP

## 2023-01-29 NOTE — Progress Notes (Signed)
Cardiology Office Note:    Date:  01/30/2023   ID:  Alan White, DOB October 02, 1960, MRN [id number]  PCP:  Joycelyn Rua, MD [facility] Cardiologist: Chrystie Nose, MD   Reason for visit: 1 year follow-up  History of Present Illness:    Alan White is a 62 y.o. male with a hx of chronic systolic heart failure with ICD implanted 2021, baroreflex implant (batwire) 2021, CAD with DES to Lcx & LAD in 2015, angioplasty to LAD 2020, hyperlipidemia, sleep apnea, history of tobacco use.    Admitted 01/31/2019 with anterolateral STEMI after stopping all his cardiac medications. Emergent cardiac catheterization obtained on 01/31/2019 showed 100% proximal LAD occlusion treated with balloon angioplasty.  Recommended to continue aspirin and Plavix indefinitely.  Was last seen by Azalee Course in February 2023.  No chest pain, no worsening shortness of breath.  Recent echo showed improved EF.  Today, he states following up with Dr. Jodie Echevaria at the [facility] for his cardiology care for the past year.  But he would like to return to [facility] heart care.  He states Dr. Jodie Echevaria recently wanted to change his medicines to increase his blood pressure and also wanted to decrease his cholesterol.  Dr. Jodie Echevaria recommended he halve his Entresto 24-26 mg tablet.  Patient tried Zetia but felt some throat swelling.  Patient was nervous about changing his regimen.  He states that he completed cardiac rehab 8 years ago and restarted cardiac rehab 3 weeks ago.  He states he was depressed after quitting work a couple years ago but has finally gotten his head in a better space.  Since starting cardiac rehab, he is eating better and has lost 6 pounds.  He has smoked 1 pack for the past 48 years and is now down to half pack for last 7 months.  His goal is to quit this summer.  He has quit 3 times previously.  He has some concern about blood pressure often systolics 90s to 100s over 60s.  His blood pressure does go up with exercise.  He denies  lightheadedness and syncope.  Patient states he can walk 5 miles on flat ground but does experience shortness of breath on inclines.  He would have to rest after 2 flights of stairs.  This has been stable.  He denies chest pain, PND, orthopnea and lower extremity edema.  He states he has he had echo at the [place] 6 weeks ago with EF stable at 40-45%.  He is monitored by EP for his Barostim; his setting has been stable at 6.8 and he follows with them yearly.  With his previous MIs, he had severe chest pressure and arm pain.  He has had no recurrent angina.      Past Medical History:  Diagnosis Date   AICD (automatic cardioverter/defibrillator) present    [employer]) [employer] ICD   CAD (coronary artery disease), 12/29/13 PCI/DES LCX and PCI/DES LAD with overlapping DES  12/30/2013   cath 07/05/14 OK   CHF (congestive heart failure) (HCC)    Dyslipidemia, goal LDL below 70 12/30/2013   GERD (gastroesophageal reflux disease)    Metabolic syndrome, HgbA1C 6.0  12/30/2013   Myocardial infarction (HCC) 2015,2020   Sleep apnea    by history - patient denies this dx as of 08/01/20   Smoker    Wears partial dentures    upper    Past Surgical History:  Procedure Laterality Date   BAROREFLEX SYSTEM INSERTION Right 08/02/2020   Procedure:  BAROREFLEX SYSTEM INSERTION;  Surgeon: Hillis Range, MD;  [facility] IN[facility];  Service: Cardiovascular;  Laterality: Right;   CARDIAC CATHETERIZATION  07/05/14,01/31/19   patent stents   CORONARY ANGIOPLASTY WITH STENT PLACEMENT  12/29/13   DES to LCX and overlapping stents DES mid LAD   CORONARY/GRAFT ACUTE MI REVASCULARIZATION N/A 01/31/2019   Procedure: Coronary/Graft Acute MI Revascularization;  Surgeon: Kathleene Hazel, MD;  Location: [facility];  Service: Cardiovascular;  Laterality: N/A;   ICD IMPLANT N/A 12/17/2019   Procedure: ICD IMPLANT;  Surgeon: Regan Lemming, MD;  Location: [facility] INVASIVE CV LAB;  Service: Cardiovascular;   Laterality: N/A;   LEFT HEART CATH AND CORONARY ANGIOGRAPHY N/A 01/31/2019   Procedure: LEFT HEART CATH AND CORONARY ANGIOGRAPHY;  Surgeon: Kathleene Hazel, MD;  Location: [facility];  Service: Cardiovascular;  Laterality: N/A;   LEFT HEART CATHETERIZATION WITH CORONARY ANGIOGRAM N/A 12/29/2013   Procedure: LEFT HEART CATHETERIZATION WITH CORONARY ANGIOGRAM;  Surgeon: [name], MD;  Location: [facility] CATH LAB;  Service: Cardiovascular;  Laterality: N/A;   LEFT HEART CATHETERIZATION WITH CORONARY ANGIOGRAM N/A 07/05/2014   Procedure: LEFT HEART CATHETERIZATION WITH CORONARY ANGIOGRAM;  Surgeon: Peter M Swaziland, MD;  Location: [facility] CATH LAB;  Service: Cardiovascular;  Laterality: N/A;   PERCUTANEOUS CORONARY STENT INTERVENTION (PCI-S)  12/29/2013   Procedure: PERCUTANEOUS CORONARY STENT INTERVENTION (PCI-S);  Surgeon: [name], MD;  Location: [facility] CATH LAB;  Service: Cardiovascular;;    Current Medications: Current Meds  Medication Sig   aspirin EC 81 MG EC tablet Take 1 tablet (81 mg total) by mouth daily.   atorvastatin (LIPITOR) 80 MG tablet TAKE 1 TABLET BY MOUTH EVERY DAY   clopidogrel (PLAVIX) 75 MG tablet TAKE 1 TABLET BY MOUTH EVERY DAY   ENTRESTO 24-26 MG TAKE 1 TABLET BY MOUTH TWICE A DAY (Patient taking differently: 0.5 tablets 2 (two) times daily.)   isosorbide mononitrate (IMDUR) 30 MG 24 hr tablet 1/2 tablet in the morning   metoprolol succinate (TOPROL XL) 25 MG 24 hr tablet Take 0.5 tablets (12.5 mg total) by mouth daily.   nitroGLYCERIN (NITROSTAT) 0.4 MG SL tablet Place 1 tablet (0.4 mg total) under the tongue every 5 (five) minutes x 3 doses as needed for chest pain.   pantoprazole (PROTONIX) 40 MG tablet TAKE 1 TABLET BY MOUTH EVERY DAY   spironolactone (ALDACTONE) 25 MG tablet TAKE 1/2 TABLET BY MOUTH EVERY DAY   [DISCONTINUED] carvedilol (COREG) 3.125 MG tablet TAKE 1 TABLET BY MOUTH TWICE A DAY WITH MEALS     Allergies:   Zetia [ezetimibe]   Social  History   Socioeconomic History   Marital status: Married    Spouse name:     Number of children: Not on file   Years of education: Not on file   Highest education level: Not on file  Occupational History   Occupation: Event organiser: [employer]  Tobacco Use   Smoking status: Every Day    Packs/day: 0.50    Years: 50.00    Additional pack years: 0.00    Total pack years: 25.00    Types: Cigarettes   Smokeless tobacco: Never  Vaping Use   Vaping Use: Never used  Substance and Sexual Activity   Alcohol use: Yes    Comment: "Once in a blue moon"   Drug use: No   Sexual activity: Not on file  Other Topics Concern  Not on file  Social History Narrative   Pt lives with wife.   Social Determinants of Health   Financial Resource Strain: Not on file  Food Insecurity: Not on file  Transportation Needs: Not on file  Physical Activity: Not on file  Stress: Not on file  Social Connections: Not on file     Family History: The patient's family history includes Cancer in his father and mother; Diabetes in his father and mother; Heart attack in his brother and brother; Stroke in his sister.  ROS:   Please see the history of present illness.     EKGs/Labs/Other Studies Reviewed:    Recent Labs: No results found for requested labs within last 365 days.   Recent Lipid Panel Lab Results  Component Value Date/Time   CHOL 117 04/20/2020 08:10 AM   TRIG 95 04/20/2020 08:10 AM   HDL 32 (L) 04/20/2020 08:10 AM   LDLCALC 67 04/20/2020 08:10 AM    Physical Exam:    VS:  BP 103/69   Pulse 69   Ht 6' (1.829 m)   Wt 206 lb (93.4 kg)   SpO2 96%   BMI 27.94 kg/m    No data found.   Wt Readings from Last 3 Encounters:  01/30/23 206 lb (93.4 kg)  01/01/23 210 lb 1.6 oz (95.3 kg)  06/25/22 208 lb (94.3 kg)     GEN:  Well nourished, well developed in no acute distress HEENT: Normal NECK: No JVD; No carotid bruits CARDIAC: RRR, no murmurs, rubs,  gallops RESPIRATORY:  Clear to auscultation without rales, wheezing or rhonchi  ABDOMEN: Soft, non-tender, non-distended MUSCULOSKELETAL: No edema SKIN: Warm and dry NEUROLOGIC:  Alert and oriented PSYCHIATRIC:  Normal affect     ASSESSMENT AND PLAN   Chronic systolic and diastolic heart failure, euvolemic Ischemic cardiomyopathy -ICD & Batwire implant 2021 -Echo 08/2021 with EF 40 to 45%; reported EF on recent echo at the [place] 40 to 45% -Will try to get recent blood work from the [place]. -With borderline blood pressure, stop Coreg.  Start Toprol XL 12.5 mg daily. -Okay to continue Entresto 24-26 mg half tablet twice daily.  If continues having borderline blood pressure, would consider changing Entresto to losartan 25 mg daily. -Continue spironolactone 12.5 mg daily. -Not a SGLT 2 inhibitor candidate secondary to hypotension.  Coronary artery disease, no angina -DES to Lcx & LAD in 2015; angioplasty to LAD 2020 -Continue DAPT indefinitely -Continue Lipitor 80 mg daily. -Continue cardiac rehab.  Tobacco cessation strongly encouraged.  Hyperlipidemia with goal LDL less than 70 -Currently on Lipitor 80 mg daily.  States throat swelling with Zetia. -Will try to get recent lipids from the [place].  Otherwise we will check fasting lipids before next appointment in 6 months. -Discussed cholesterol lowering diets - Mediterranean diet, DASH diet, vegetarian diet, low-carbohydrate diet and avoidance of trans fats.  Discussed healthier choice substitutes.  Nuts, high-fiber foods, and fiber supplements may also improve lipids.    Tobacco use  -Recommend tobacco cessation.  Reviewed physiologic effects of nicotine and the immediate-eventual benefits of quitting including improvement in cough/breathing and reduction in cardiovascular events.  Discussed quitting tips such as removing triggers and getting support from family/friends and Quitline [place]. -USPSTF recommends one-time screening for abdominal aortic  aneurysm (AAA) by ultrasound in men 80 -55 years old who have ever smoked.      Disposition - Follow-up in 6 months.  Medication Adjustments/Labs and Tests Ordered: Current medicines are reviewed at length with  the patient today.  Concerns regarding medicines are outlined above.  Orders Placed This Encounter  Procedures   Comprehensive metabolic panel   Lipid panel   Meds ordered this encounter  Medications   metoprolol succinate (TOPROL XL) 25 MG 24 hr tablet    Sig: Take 0.5 tablets (12.5 mg total) by mouth daily.    Dispense:  90 tablet    Refill:  3    Patient Instructions  Medication Instructions:  Stop Coreg. Start Toprol-XL 12.5 mg ( Take 1/2 of 25 mg Tablet Daily). *If you need a refill on your cardiac medications before your next appointment, please call your pharmacy*   Lab Work: CMET, Lipid Panel : Prior to Follow-up Appointment. If you have labs (blood work) drawn today and your tests are completely normal, you will receive your results only by: MyChart Message (if you have MyChart) OR A paper copy in the mail If you have any lab test that is abnormal or we need to change your treatment, we will call you to review the results.   Testing/Procedures: No Testing   Follow-Up: At [facility], you and your health needs are our priority.  As part of our continuing mission to provide you with exceptional heart care, we have created designated Provider Care Teams.  These Care Teams include your primary Cardiologist (physician) and Advanced Practice Providers (APPs -  Physician Assistants and Nurse Practitioners) who all work together to provide you with the care you need, when you need it.  We recommend signing up for the patient portal called "MyChart".  Sign up information is provided on this After Visit Summary.  MyChart is used to connect with patients for Virtual Visits (Telemedicine).  Patients are able to view lab/test results, encounter notes, upcoming  appointments, etc.  Non-urgent messages can be sent to your provider as well.   To learn more about what you can do with MyChart, go to [email or web address].    Your next appointment:   6 month(s)  Provider:   Chrystie Nose, MD     Signed, Cannon Kettle, PA-C  01/30/2023 10:22 AM    [facility]

## 2023-01-30 ENCOUNTER — Encounter: Payer: Self-pay | Admitting: Physician Assistant

## 2023-01-30 ENCOUNTER — Ambulatory Visit: Payer: PPO | Attending: Physician Assistant | Admitting: Physician Assistant

## 2023-01-30 ENCOUNTER — Encounter (HOSPITAL_COMMUNITY): Payer: No Typology Code available for payment source

## 2023-01-30 VITALS — BP 103/69 | HR 69 | Ht 72.0 in | Wt 206.0 lb

## 2023-01-30 DIAGNOSIS — Z720 Tobacco use: Secondary | ICD-10-CM

## 2023-01-30 DIAGNOSIS — I2510 Atherosclerotic heart disease of native coronary artery without angina pectoris: Secondary | ICD-10-CM | POA: Diagnosis not present

## 2023-01-30 DIAGNOSIS — E785 Hyperlipidemia, unspecified: Secondary | ICD-10-CM

## 2023-01-30 DIAGNOSIS — I255 Ischemic cardiomyopathy: Secondary | ICD-10-CM

## 2023-01-30 MED ORDER — METOPROLOL SUCCINATE ER 25 MG PO TB24: 12.5000 mg | ORAL_TABLET | Freq: Every day | ORAL | 3 refills | Status: DC

## 2023-01-30 NOTE — Patient Instructions (Signed)
Medication Instructions:  Stop Coreg. Start Toprol-XL 12.5 mg ( Take 1/2 of 25 mg Tablet Daily). *If you need a refill on your cardiac medications before your next appointment, please call your pharmacy*   Lab Work: CMET, Lipid Panel : Prior to Follow-up Appointment. If you have labs (blood work) drawn today and your tests are completely normal, you will receive your results only by: MyChart Message (if you have MyChart) OR A paper copy in the mail If you have any lab test that is abnormal or we need to change your treatment, we will call you to review the results.   Testing/Procedures: No Testing   Follow-Up: At [facility], you and your health needs are our priority.  As part of our continuing mission to provide you with exceptional heart care, we have created designated Provider Care Teams.  These Care Teams include your primary Cardiologist (physician) and Advanced Practice Providers (APPs -  Physician Assistants and Nurse Practitioners) who all work together to provide you with the care you need, when you need it.  We recommend signing up for the patient portal called "MyChart".  Sign up information is provided on this After Visit Summary.  MyChart is used to connect with patients for Virtual Visits (Telemedicine).  Patients are able to view lab/test results, encounter notes, upcoming appointments, etc.  Non-urgent messages can be sent to your provider as well.   To learn more about what you can do with MyChart, go to [email or web address].    Your next appointment:   6 month(s)  Provider:   Chrystie Nose, MD

## 2023-02-01 ENCOUNTER — Encounter (HOSPITAL_COMMUNITY): Payer: No Typology Code available for payment source

## 2023-02-04 ENCOUNTER — Encounter (HOSPITAL_COMMUNITY)
Admission: RE | Admit: 2023-02-04 | Discharge: 2023-02-04 | Disposition: A | Discharge status: Home / Self Care | Payer: No Typology Code available for payment source | Source: Ambulatory Visit | Attending: Cardiology | Admitting: Cardiology

## 2023-02-04 DIAGNOSIS — I255 Ischemic cardiomyopathy: Secondary | ICD-10-CM | POA: Diagnosis present

## 2023-02-04 NOTE — Progress Notes (Signed)
Cardiac Individual Treatment Plan  Patient Details  Name: Alan White MRN: [id number] Date of Birth: Mar 01, 1961 Referring Provider:   Flowsheet Row INTENSIVE CARDIAC REHAB ORIENT from 01/01/2023 in [facility]  Referring Provider [name], MD  Armanda Magic, MD covering]       Initial Encounter Date:  Flowsheet Row INTENSIVE CARDIAC REHAB ORIENT from 01/01/2023 in [facility]  Date 01/01/23       Visit Diagnosis: Ischemic cardiomyopathy  Patient's Home Medications on Admission:  Current Outpatient Medications:    aspirin EC 81 MG EC tablet, Take 1 tablet (81 mg total) by mouth daily., Disp: 30 tablet, Rfl: 11   atorvastatin (LIPITOR) 80 MG tablet, TAKE 1 TABLET BY MOUTH EVERY DAY, Disp: 90 tablet, Rfl: 2   clopidogrel (PLAVIX) 75 MG tablet, TAKE 1 TABLET BY MOUTH EVERY DAY, Disp: 90 tablet, Rfl: 0   ENTRESTO 24-26 MG, TAKE 1 TABLET BY MOUTH TWICE A DAY (Patient taking differently: 0.5 tablets 2 (two) times daily.), Disp: 60 tablet, Rfl: 11   isosorbide mononitrate (IMDUR) 30 MG 24 hr tablet, 1/2 tablet in the morning, Disp: , Rfl:    metoprolol succinate (TOPROL XL) 25 MG 24 hr tablet, Take 0.5 tablets (12.5 mg total) by mouth daily., Disp: 90 tablet, Rfl: 3   nitroGLYCERIN (NITROSTAT) 0.4 MG SL tablet, Place 1 tablet (0.4 mg total) under the tongue every 5 (five) minutes x 3 doses as needed for chest pain., Disp: 25 tablet, Rfl: 12   pantoprazole (PROTONIX) 40 MG tablet, TAKE 1 TABLET BY MOUTH EVERY DAY, Disp: 90 tablet, Rfl: 3   spironolactone (ALDACTONE) 25 MG tablet, TAKE 1/2 TABLET BY MOUTH EVERY DAY, Disp: 45 tablet, Rfl: 4  Past Medical History: Past Medical History:  Diagnosis Date   AICD (automatic cardioverter/defibrillator) present    [employer] ([employer]) [employer] ICD   CAD (coronary artery disease), 12/29/13 PCI/DES LCX and PCI/DES LAD with  overlapping DES  12/30/2013   cath 07/05/14 OK   CHF (congestive heart failure) (HCC)    Dyslipidemia, goal LDL below 70 12/30/2013   GERD (gastroesophageal reflux disease)    Metabolic syndrome, HgbA1C 6.0  12/30/2013   Myocardial infarction (HCC) 2015,2020   Sleep apnea    by history - patient denies this dx as of 08/01/20   Smoker    Wears partial dentures    upper    Tobacco Use: Social History   Tobacco Use  Smoking Status Every Day   Packs/day: 0.50   Years: 50.00   Additional pack years: 0.00   Total pack years: 25.00   Types: Cigarettes  Smokeless Tobacco Never    Labs: Review Flowsheet  More data exists      Latest Ref Rng & Units 05/29/2018 01/31/2019 05/19/2019 04/20/2020 08/02/2020  Labs for ITP Cardiac and Pulmonary Rehab  Cholestrol 100 - 199 mg/dL 409  - 811  914  -  LDL (calc) 0 - 99 mg/dL 782  - 60  67  -  HDL-C >39 mg/dL 27  - 28  32  -  Trlycerides 0 - 149 mg/dL 956  - 213  95  -  Hemoglobin A1c 4.8 - 5.6 % - - 5.8  - -  PH, Arterial 7.350 - 7.450 - 7.149  - - -  PCO2 arterial 32.0 - 48.0 mmHg - 47.6  - - -  Bicarbonate 20.0 - 28.0 mmol/L -  16.5  - - -  TCO2 22 - 32 mmol/L - 18  - - 24   Acid-base deficit 0.0 - 2.0 mmol/L - 12.0  - - -  O2 Saturation % - 81.0  - - -    Capillary Blood Glucose: Lab Results  Component Value Date   GLUCAP 193 (H) 08/02/2020   GLUCAP 181 (H) 08/02/2020   GLUCAP 130 (H) 08/02/2020     Exercise Target Goals: Exercise Program Goal: Individual exercise prescription set using results from initial 6 min walk test and THRR while considering  patient's activity barriers and safety.   Exercise Prescription Goal: Initial exercise prescription builds to 30-45 minutes a day of aerobic activity, 2-3 days per week.  Home exercise guidelines will be given to patient during program as part of exercise prescription that the participant will acknowledge.  Activity Barriers & Risk Stratification:  Activity Barriers & Cardiac Risk  Stratification - 01/01/23 0937       Activity Barriers & Cardiac Risk Stratification   Activity Barriers None    Cardiac Risk Stratification High             6 Minute Walk:  6 Minute Walk     Row Name 01/01/23 1045         6 Minute Walk   Phase Initial     Distance 1444 feet     Walk Time 6 minutes     # of Rest Breaks 0     MPH 2.73     METS 3.28     RPE 9     Perceived Dyspnea  0.5     VO2 Peak 11.47     Symptoms Yes (comment)     Comments Mild shortness of breath.     Resting HR 68 bpm     Resting BP 118/76     Resting Oxygen Saturation  97 %     Exercise Oxygen Saturation  during 6 min walk 98 %     Max Ex. HR 86 bpm     Max Ex. BP 110/72     2 Minute Post BP 118/66              Oxygen Initial Assessment:   Oxygen Re-Evaluation:   Oxygen Discharge (Final Oxygen Re-Evaluation):   Initial Exercise Prescription:  Initial Exercise Prescription - 01/01/23 1200       Date of Initial Exercise RX and Referring Provider   Date 01/01/23    Referring Provider [name], MD   Armanda Magic, MD covering   Expected Discharge Date 03/22/23      Treadmill   MPH 1.7    Grade 0    Minutes 15    METs 2.3      NuStep   Level 2    SPM 85    Minutes 15    METs 2.3      Prescription Details   Frequency (times per week) 3    Duration Progress to 30 minutes of continuous aerobic without signs/symptoms of physical distress      Intensity   THRR 40-80% of Max Heartrate 63-126    Ratings of Perceived Exertion 11-13    Perceived Dyspnea 0-4      Progression   Progression Continue to progress workloads to maintain intensity without signs/symptoms of physical distress.      Resistance Training   Training Prescription Yes    Weight 4 lbs    Reps 10-15  Perform Capillary Blood Glucose checks as needed.  Exercise Prescription Changes:   Exercise Prescription Changes     Row Name 01/07/23 1022 01/28/23 1030            Response to Exercise   Blood Pressure (Admit) 102/70 118/52      Blood Pressure (Exercise) 122/72 108/80      Blood Pressure (Exit) 90/62 98/60      Heart Rate (Admit) 73 bpm 86 bpm      Heart Rate (Exercise) 106 bpm 113 bpm      Heart Rate (Exit) 78 bpm 92 bpm      Rating of Perceived Exertion (Exercise) 10 12      Symptoms None None      Comments Off to an excellent start with exercise. Patient increases TM about halfway from 2.5 to 3.0 mph with no incline.      Duration Continue with 30 min of aerobic exercise without signs/symptoms of physical distress. Continue with 30 min of aerobic exercise without signs/symptoms of physical distress.      Intensity THRR unchanged THRR unchanged        Progression   Progression Continue to progress workloads to maintain intensity without signs/symptoms of physical distress. Continue to progress workloads to maintain intensity without signs/symptoms of physical distress.      Average METs 3 3.4        Resistance Training   Training Prescription Yes Yes      Weight 4 lbs 4 lbs      Reps 10-15 10-15      Time 10 Minutes 10 Minutes        Interval Training   Interval Training No No        Treadmill   MPH 2.5 3  2.5 to 3.0 mph halfway through      Grade 0 0      Minutes 15 15      METs 2.91 2.91        NuStep   Level 4 4      SPM 88 88      Minutes 15 15      METs 3.2 3.2        Home Exercise Plan   Plans to continue exercise at -- Home (comment)  Treadmill, walking      Frequency -- Add 2 additional days to program exercise sessions.      Initial Home Exercises Provided -- 01/28/23               Exercise Comments:   Exercise Comments     Row Name 01/07/23 1132 01/28/23 1046         Exercise Comments Patient tolerated first session of exercise well without symptoms. Oriented to warm-up / cool-down routine and equipment. Reviewed home exercise guidelines, METs, and goals with patient.               Exercise Goals and  Review:   Exercise Goals     Row Name 01/01/23 0946             Exercise Goals   Increase Physical Activity Yes       Intervention Provide advice, education, support and counseling about physical activity/exercise needs.;Develop an individualized exercise prescription for aerobic and resistive training based on initial evaluation findings, risk stratification, comorbidities and participant's personal goals.       Expected Outcomes Short Term: Attend rehab on a regular basis to increase amount of physical activity.;Long Term: Add  in home exercise to make exercise part of routine and to increase amount of physical activity.;Long Term: Exercising regularly at least 3-5 days a week.       Increase Strength and Stamina Yes       Intervention Provide advice, education, support and counseling about physical activity/exercise needs.;Develop an individualized exercise prescription for aerobic and resistive training based on initial evaluation findings, risk stratification, comorbidities and participant's personal goals.       Expected Outcomes Short Term: Increase workloads from initial exercise prescription for resistance, speed, and METs.;Short Term: Perform resistance training exercises routinely during rehab and add in resistance training at home;Long Term: Improve cardiorespiratory fitness, muscular endurance and strength as measured by increased METs and functional capacity ( )       Able to understand and use rate of perceived exertion (RPE) scale Yes       Intervention Provide education and explanation on how to use RPE scale       Expected Outcomes Short Term: Able to use RPE daily in rehab to express subjective intensity level;Long Term:  Able to use RPE to guide intensity level when exercising independently       Knowledge and understanding of Target Heart Rate Range (THRR) Yes       Intervention Provide education and explanation of THRR including how the numbers were predicted and where  they are located for reference       Expected Outcomes Short Term: Able to state/look up THRR;Long Term: Able to use THRR to govern intensity when exercising independently;Short Term: Able to use daily as guideline for intensity in rehab       Able to check pulse independently Yes       Intervention Provide education and demonstration on how to check pulse in carotid and radial arteries.;Review the importance of being able to check your own pulse for safety during independent exercise       Expected Outcomes Short Term: Able to explain why pulse checking is important during independent exercise;Long Term: Able to check pulse independently and accurately       Understanding of Exercise Prescription Yes       Intervention Provide education, explanation, and written materials on patient's individual exercise prescription       Expected Outcomes Short Term: Able to explain program exercise prescription;Long Term: Able to explain home exercise prescription to exercise independently                Exercise Goals Re-Evaluation :  Exercise Goals Re-Evaluation     Row Name 01/07/23 1132 01/28/23 1046           Exercise Goal Re-Evaluation   Exercise Goals Review Increase Physical Activity;Increase Strength and Stamina;Able to understand and use rate of perceived exertion (RPE) scale Increase Physical Activity;Increase Strength and Stamina;Able to understand and use rate of perceived exertion (RPE) scale;Knowledge and understanding of Target Heart Rate Range (THRR);Understanding of Exercise Prescription      Comments Patient able to understand and use RPE scale appropriately. Patient states he's more motivated, has more energy and is eating healhtier. Patient started using his treadmill yesterday for 15 minutes at 3.0/0.0 and tolerated well. Patient's nicotine intake has decreased from 10 cigarettes to 8 cigarettes/day. Patient is still SOB going up hills. Will add incline to TM prescription at cardiac  rehab to help decrease SOB with inclines. Reviewed exercise prescription.      Expected Outcomes Progress workloads as tolerated to help improve cardiorespiratory fitness. Patient will exercise  at least 30 minutes 2 days/week at home. Will add incline to TM at CR.               Discharge Exercise Prescription (Final Exercise Prescription Changes):  Exercise Prescription Changes - 01/28/23 1030       Response to Exercise   Blood Pressure (Admit) 118/52    Blood Pressure (Exercise) 108/80    Blood Pressure (Exit) 98/60    Heart Rate (Admit) 86 bpm    Heart Rate (Exercise) 113 bpm    Heart Rate (Exit) 92 bpm    Rating of Perceived Exertion (Exercise) 12    Symptoms None    Comments Patient increases TM about halfway from 2.5 to 3.0 mph with no incline.    Duration Continue with 30 min of aerobic exercise without signs/symptoms of physical distress.    Intensity THRR unchanged      Progression   Progression Continue to progress workloads to maintain intensity without signs/symptoms of physical distress.    Average METs 3.4      Resistance Training   Training Prescription Yes    Weight 4 lbs    Reps 10-15    Time 10 Minutes      Interval Training   Interval Training No      Treadmill   MPH 3   2.5 to 3.0 mph halfway through   Grade 0    Minutes 15    METs 2.91      NuStep   Level 4    SPM 88    Minutes 15    METs 3.2      Home Exercise Plan   Plans to continue exercise at Home (comment)   Treadmill, walking   Frequency Add 2 additional days to program exercise sessions.    Initial Home Exercises Provided 01/28/23             Nutrition:  Target Goals: Understanding of nutrition guidelines, daily intake of sodium 1500mg , cholesterol 200mg , calories 30% from fat and 7% or less from saturated fats, daily to have 5 or more servings of fruits and vegetables.  Biometrics:  Pre Biometrics - 01/01/23 0919       Pre Biometrics   Waist Circumference 44.25  inches    Hip Circumference 43.75 inches    Waist to Hip Ratio 1.01 %    Triceps Skinfold 22 mm    % Body Fat 30.9 %    Grip Strength 40 kg    Flexibility 10.88 in    Single Leg Stand 13.93 seconds              Nutrition Therapy Plan and Nutrition Goals:  Nutrition Therapy & Goals - 02/04/23 1059       Nutrition Therapy   Diet Heart Healthy Diet    Drug/Food Interactions Statins/Certain Fruits      Personal Nutrition Goals   Nutrition Goal Patient to identify strategies for reducing cardiovascular risk by attending the [employer] education and nutrition series weekly.    Personal Goal #2 Patient to improve diet quality by using the plate method as a guide for meal planning to include lean protein/plant protein, fruits, vegetables, whole grains, nonfat dairy as part of a well-balanced diet.    Personal Goal #3 Patient to reduce sodium intake to 1500mg  per day    Personal Goal #4 Patient to identify food sources and limit daily intake of saturated fat, trans fat, sodium, and refined carbohydrates.    Comments Goals in action.  Alan White continues to attend the [employer] and nutrition series regularly. He has started making many dietary changes including reduced mountain dew from 5/day to 3/day, drastically reduced fried foods and decreased portions of meat, reduced snacking on refined carbohydrates (chips) and switched to fruit, and increased non-starchy vegetables at dinner daily. Alan White remains motivated to continue to improve food choices. He is down 3.5# since starting with our program. Alan White will benefit from participation in intensive cardiac rehab for nutrition, exercise, and lifestyle modification.      Intervention Plan   Intervention Prescribe, educate and counsel regarding individualized specific dietary modifications aiming towards targeted core components such as weight, hypertension, lipid management, diabetes, heart failure and other comorbidities.;Nutrition handout(s)  given to patient.    Expected Outcomes Short Term Goal: Understand basic principles of dietary content, such as calories, fat, sodium, cholesterol and nutrients.;Long Term Goal: Adherence to prescribed nutrition plan.             Nutrition Assessments:  MEDIFICTS Score Key: ?70 Need to make dietary changes  40-70 Heart Healthy Diet ? 40 Therapeutic Level Cholesterol Diet    Picture Your Plate Scores: <16 Unhealthy dietary pattern with much room for improvement. 41-50 Dietary pattern unlikely to meet recommendations for good health and room for improvement. 51-60 More healthful dietary pattern, with some room for improvement.  >60 Healthy dietary pattern, although there may be some specific behaviors that could be improved.    Nutrition Goals Re-Evaluation:  Nutrition Goals Re-Evaluation     Row Name 01/07/23 1154 02/04/23 1059           Goals   Current Weight 210 lb 5.1 oz (95.4 kg) 206 lb 9.1 oz (93.7 kg)      Comment lipids WNL,- HDL 27, A1c 6.1 New labs from 5/1 but have not been released. He has cut back on smoking.      Expected Outcome Alan White reports drinking mountain dew (>5/day), eating out 3-4x/week, and over eating frieds foods. He is receptive to making dietary changes and enjoys beans and vegetables. Alan White will benefit from participation in intensive cardiac rehab for nutrition, exercise, and lifestyle modification. Goals in action. Alan White continues to attend the [employer] and nutrition series regularly. He has started making many dietary changes including reduced mountain dew from 5/day to 3/day, drastically reduced fried foods and decreased portions of meat, reduced snacking on refined carbohydrates (chips) and switched to fruit, and increased non-starchy vegetables at dinner daily. Alan White remains motivated to continue to improve food choices. He is down 3.5# since starting with our program. Alan White will benefit from participation in intensive cardiac rehab for  nutrition, exercise, and lifestyle modification.               Nutrition Goals Re-Evaluation:  Nutrition Goals Re-Evaluation     Row Name 01/07/23 1154 02/04/23 1059           Goals   Current Weight 210 lb 5.1 oz (95.4 kg) 206 lb 9.1 oz (93.7 kg)      Comment lipids WNL,- HDL 27, A1c 6.1 New labs from 5/1 but have not been released. He has cut back on smoking.      Expected Outcome Alan White reports drinking mountain dew (>5/day), eating out 3-4x/week, and over eating frieds foods. He is receptive to making dietary changes and enjoys beans and vegetables. Alan White will benefit from participation in intensive cardiac rehab for nutrition, exercise, and lifestyle modification. Goals in action. Alan White continues to attend the [employer]  and nutrition series regularly. He has started making many dietary changes including reduced mountain dew from 5/day to 3/day, drastically reduced fried foods and decreased portions of meat, reduced snacking on refined carbohydrates (chips) and switched to fruit, and increased non-starchy vegetables at dinner daily. Alan White remains motivated to continue to improve food choices. He is down 3.5# since starting with our program. Alan White will benefit from participation in intensive cardiac rehab for nutrition, exercise, and lifestyle modification.               Nutrition Goals Discharge (Final Nutrition Goals Re-Evaluation):  Nutrition Goals Re-Evaluation - 02/04/23 1059       Goals   Current Weight 206 lb 9.1 oz (93.7 kg)    Comment New labs from 5/1 but have not been released. He has cut back on smoking.    Expected Outcome Goals in action. Alan White continues to attend the [employer] and nutrition series regularly. He has started making many dietary changes including reduced mountain dew from 5/day to 3/day, drastically reduced fried foods and decreased portions of meat, reduced snacking on refined carbohydrates (chips) and switched to fruit, and increased  non-starchy vegetables at dinner daily. Alan White remains motivated to continue to improve food choices. He is down 3.5# since starting with our program. Alan White will benefit from participation in intensive cardiac rehab for nutrition, exercise, and lifestyle modification.             Psychosocial: Target Goals: Acknowledge presence or absence of significant depression and/or stress, maximize coping skills, provide positive support system. Participant is able to verbalize types and ability to use techniques and skills needed for reducing stress and depression.  Initial Review & Psychosocial Screening:  Initial Psych Review & Screening - 01/01/23 0954       Initial Review   Current issues with Current Sleep Concerns   Patient states he sleeps about 4 hours and then wakes up and has difficulty getting back to sleep, thinking a lot.     Family Dynamics   Good Support System? Yes    Comments Patient has a very supportive wife and family, children.      Barriers   Psychosocial barriers to participate in program The patient should benefit from training in stress management and relaxation.      Screening Interventions   Interventions Encouraged to exercise;To provide support and resources with identified psychosocial needs;Provide feedback about the scores to participant    Expected Outcomes Short Term goal: Identification and review with participant of any Quality of Life or Depression concerns found by scoring the questionnaire.;Long Term goal: The participant improves quality of Life and PHQ9 Scores as seen by post scores and/or verbalization of changes             Quality of Life Scores:  Quality of Life - 01/01/23 1247       Quality of Life   Select Quality of Life      Quality of Life Scores   Health/Function Pre 18.6 %    Socioeconomic Pre 23.79 %    Psych/Spiritual Pre 21.43 %    Family Pre 22.5 %    GLOBAL Pre 20.82 %            Scores of 19 and below usually  indicate a poorer quality of life in these areas.  A difference of  2-3 points is a clinically meaningful difference.  A difference of 2-3 points in the total score of the Quality of Life Index has  been associated with significant improvement in overall quality of life, self-image, physical symptoms, and general health in studies assessing change in quality of life.  PHQ-9: Review Flowsheet       01/01/2023 03/22/2014  Depression screen PHQ 2/9  Decreased Interest 2 0  Down, Depressed, Hopeless 2 0  PHQ - 2 Score 4 0  Altered sleeping 2 -  Tired, decreased energy 2 -  Change in appetite 2 -  Feeling bad or failure about yourself  2 -  Trouble concentrating 0 -  Moving slowly or fidgety/restless 0 -  Suicidal thoughts 0 -  PHQ-9 Score 12 -  Difficult doing work/chores Somewhat difficult -   Interpretation of Total Score  Total Score Depression Severity:  1-4 = Minimal depression, 5-9 = Mild depression, 10-14 = Moderate depression, 15-19 = Moderately severe depression, 20-27 = Severe depression   Psychosocial Evaluation and Intervention:   Psychosocial Re-Evaluation:  Psychosocial Re-Evaluation     Row Name 01/10/23 1543 02/05/23 1509           Psychosocial Re-Evaluation   Current issues with Current Sleep Concerns Current Sleep Concerns      Comments Alan White has not voiced  any increased concerns or stressors at cardiac rehab. Will review quality of life and PHQ 9 in the near future Reviewed Kelly's quality of life. Alan White reports that his energy level has imporved since he has been participating in intensive cardiac rehab. Alan White denies being depressed currently      Expected Outcomes Alan White will have controlled or decreased stress concerns upon completion of intensive cardiac rehab. Alan White will have controlled or decreased stress concerns upon completion of intensive cardiac rehab.      Interventions Encouraged to attend Cardiac Rehabilitation for the exercise;Relaxation  education;Stress management education Encouraged to attend Cardiac Rehabilitation for the exercise;Relaxation education;Stress management education      Continue Psychosocial Services  Follow up required by staff No Follow up required               Psychosocial Discharge (Final Psychosocial Re-Evaluation):  Psychosocial Re-Evaluation - 02/05/23 1509       Psychosocial Re-Evaluation   Current issues with Current Sleep Concerns    Comments Reviewed Kelly's quality of life. Alan White reports that his energy level has imporved since he has been participating in intensive cardiac rehab. Alan White denies being depressed currently    Expected Outcomes Alan White will have controlled or decreased stress concerns upon completion of intensive cardiac rehab.    Interventions Encouraged to attend Cardiac Rehabilitation for the exercise;Relaxation education;Stress management education    Continue Psychosocial Services  No Follow up required             Vocational Rehabilitation: Provide vocational rehab assistance to qualifying candidates.   Vocational Rehab Evaluation & Intervention:  Vocational Rehab - 01/01/23 1249       Initial Vocational Rehab Evaluation & Intervention   Assessment shows need for Vocational Rehabilitation No      Vocational Rehab Re-Evaulation   Comments Patient on disability, no need for vocational rehab.             Education: Education Goals: Education classes will be provided on a weekly basis, covering required topics. Participant will state understanding/return demonstration of topics presented.    Education     Row Name 01/07/23 1400     Education   Cardiac Education Topics Pritikin   Psychologist, counselling  Select Nutrition   Nutrition Nutrition Action Plan   Instruction Review Code 1- Verbalizes Understanding   Class Start Time 1145   Class Stop Time 1219   Class Time Calculation (min) 34 min    Row Name 01/09/23  1300     Education   Cardiac Education Topics Pritikin   Customer service manager   Weekly Topic Efficiency Cooking - Meals in a Snap   Instruction Review Code 1- Verbalizes Understanding   Class Start Time 1140   Class Stop Time 1220   Class Time Calculation (min) 40 min    Row Name 01/16/23 1300     Education   Cardiac Education Topics Pritikin   Orthoptist   Educator Dietitian   Weekly Topic One-Pot Wonders   Instruction Review Code 1- Verbalizes Understanding   Class Start Time 1140   Class Stop Time 1212   Class Time Calculation (min) 32 min    Row Name 01/18/23 1400     Education   Cardiac Education Topics Pritikin   Hospital doctor Education   General Education Hypertension and Heart Disease   Instruction Review Code 1- Verbalizes Understanding   Class Start Time 1147   Class Stop Time 1220   Class Time Calculation (min) 33 min    Row Name 01/21/23 1300     Education   Cardiac Education Topics Pritikin   Select Core Videos     Core Videos   Educator Nurse   Select Exercise Education   Exercise Education Biomechanial Limitations   Instruction Review Code 1- Verbalizes Understanding   Class Start Time 1156   Class Stop Time 1232   Class Time Calculation (min) 36 min    Row Name 01/23/23 1300     Education   Cardiac Education Topics Pritikin   Customer service manager   Weekly Topic Comforting Weekend Breakfasts   Instruction Review Code 1- Verbalizes Understanding   Class Start Time 1135   Class Stop Time 1223   Class Time Calculation (min) 48 min    Row Name 01/28/23 1400     Education   Cardiac Education Topics Pritikin   Licensed conveyancer Nutrition   Nutrition Facts on Fat   Instruction Review Code 1- Verbalizes  Understanding   Class Start Time 1145   Class Stop Time 1225   Class Time Calculation (min) 40 min            Core Videos: Exercise    Move It!  Clinical staff conducted group or individual video education with verbal and written material and guidebook.  Patient learns the recommended Pritikin exercise program. Exercise with the goal of living a long, healthy life. Some of the health benefits of exercise include controlled diabetes, healthier blood pressure levels, improved cholesterol levels, improved heart and lung capacity, improved sleep, and better body composition. Everyone should speak with their doctor before starting or changing an exercise routine.  Biomechanical Limitations Clinical staff conducted group or individual video education with verbal and written material and guidebook.  Patient learns how biomechanical limitations can impact exercise and how we can mitigate and possibly overcome limitations to have an impactful and balanced exercise  routine.  Body Composition Clinical staff conducted group or individual video education with verbal and written material and guidebook.  Patient learns that body composition (ratio of muscle mass to fat mass) is a key component to assessing overall fitness, rather than body weight alone. Increased fat mass, especially visceral belly fat, can put [place] at increased risk for metabolic syndrome, type 2 diabetes, heart disease, and even death. It is recommended to combine diet and exercise (cardiovascular and resistance training) to improve your body composition. Seek guidance from your physician and exercise physiologist before implementing an exercise routine.  Exercise Action Plan Clinical staff conducted group or individual video education with verbal and written material and guidebook.  Patient learns the recommended strategies to achieve and enjoy long-term exercise adherence, including variety, self-motivation, self-efficacy, and positive  decision making. Benefits of exercise include fitness, good health, weight management, more energy, better sleep, less stress, and overall well-being.  Medical   Heart Disease Risk Reduction Clinical staff conducted group or individual video education with verbal and written material and guidebook.  Patient learns our heart is our most vital organ as it circulates oxygen, nutrients, white blood cells, and hormones throughout the entire body, and carries waste away. Data supports a plant-based eating plan like the Pritikin Program for its effectiveness in slowing progression of and reversing heart disease. The video provides a number of recommendations to address heart disease.   Metabolic Syndrome and Belly Fat  Clinical staff conducted group or individual video education with verbal and written material and guidebook.  Patient learns what metabolic syndrome is, how it leads to heart disease, and how one can reverse it and keep it from coming back. You have metabolic syndrome if you have 3 of the following 5 criteria: abdominal obesity, high blood pressure, high triglycerides, low HDL cholesterol, and high blood sugar.  Hypertension and Heart Disease Clinical staff conducted group or individual video education with verbal and written material and guidebook.  Patient learns that high blood pressure, or hypertension, is very common in the [place]. Hypertension is largely due to excessive salt intake, but other important risk factors include being overweight, physical inactivity, drinking too much alcohol, smoking, and not eating enough potassium from fruits and vegetables. High blood pressure is a leading risk factor for heart attack, stroke, congestive heart failure, dementia, kidney failure, and premature death. Long-term effects of excessive salt intake include stiffening of the arteries and thickening of heart muscle and organ damage. Recommendations include ways to reduce hypertension and the  risk of heart disease.  Diseases of Our Time - Focusing on Diabetes Clinical staff conducted group or individual video education with verbal and written material and guidebook.  Patient learns why the best way to stop diseases of our time is prevention, through food and other lifestyle changes. Medicine (such as prescription pills and surgeries) is often only a Band-Aid on the problem, not a long-term solution. Most common diseases of our time include obesity, type 2 diabetes, hypertension, heart disease, and cancer. The Pritikin Program is recommended and has been proven to help reduce, reverse, and/or prevent the damaging effects of metabolic syndrome.  Nutrition   Overview of the Pritikin Eating Plan  Clinical staff conducted group or individual video education with verbal and written material and guidebook.  Patient learns about the Pritikin Eating Plan for disease risk reduction. The Pritikin Eating Plan emphasizes a wide variety of unrefined, minimally-processed carbohydrates, like fruits, vegetables, whole grains, and legumes. Go, Caution, and Stop food  choices are explained. Plant-based and lean animal proteins are emphasized. Rationale provided for low sodium intake for blood pressure control, low added sugars for blood sugar stabilization, and low added fats and oils for coronary artery disease risk reduction and weight management.  Calorie Density  Clinical staff conducted group or individual video education with verbal and written material and guidebook.  Patient learns about calorie density and how it impacts the Pritikin Eating Plan. Knowing the characteristics of the food you choose will help you decide whether those foods will lead to weight gain or weight loss, and whether you want to consume more or less of them. Weight loss is usually a side effect of the Pritikin Eating Plan because of its focus on low calorie-dense foods.  Label Reading  Clinical staff conducted group or  individual video education with verbal and written material and guidebook.  Patient learns about the Pritikin recommended label reading guidelines and corresponding recommendations regarding calorie density, added sugars, sodium content, and whole grains.  Dining Out - Part 1  Clinical staff conducted group or individual video education with verbal and written material and guidebook.  Patient learns that restaurant meals can be sabotaging because they can be so high in calories, fat, sodium, and/or sugar. Patient learns recommended strategies on how to positively address this and avoid unhealthy pitfalls.  Facts on Fats  Clinical staff conducted group or individual video education with verbal and written material and guidebook.  Patient learns that lifestyle modifications can be just as effective, if not more so, as many medications for lowering your risk of heart disease. A Pritikin lifestyle can help to reduce your risk of inflammation and atherosclerosis (cholesterol build-up, or plaque, in the artery walls). Lifestyle interventions such as dietary choices and physical activity address the cause of atherosclerosis. A review of the types of fats and their impact on blood cholesterol levels, along with dietary recommendations to reduce fat intake is also included.  Nutrition Action Plan  Clinical staff conducted group or individual video education with verbal and written material and guidebook.  Patient learns how to incorporate Pritikin recommendations into their lifestyle. Recommendations include planning and keeping personal health goals in mind as an important part of their success.  Healthy Mind-Set    Healthy Minds, Bodies, Hearts  Clinical staff conducted group or individual video education with verbal and written material and guidebook.  Patient learns how to identify when they are stressed. Video will discuss the impact of that stress, as well as the many benefits of stress management.  Patient will also be introduced to stress management techniques. The way we think, act, and feel has an impact on our hearts.  How Our Thoughts Can Heal Our Hearts  Clinical staff conducted group or individual video education with verbal and written material and guidebook.  Patient learns that negative thoughts can cause depression and anxiety. This can result in negative lifestyle behavior and serious health problems. Cognitive behavioral therapy is an effective method to help control our thoughts in order to change and improve our emotional outlook.  Additional Videos:  Exercise    Improving Performance  Clinical staff conducted group or individual video education with verbal and written material and guidebook.  Patient learns to use a non-linear approach by alternating intensity levels and lengths of time spent exercising to help burn more calories and lose more body fat. Cardiovascular exercise helps improve heart health, metabolism, hormonal balance, blood sugar control, and recovery from fatigue. Resistance training improves strength, endurance,  balance, coordination, reaction time, metabolism, and muscle mass. Flexibility exercise improves circulation, posture, and balance. Seek guidance from your physician and exercise physiologist before implementing an exercise routine and learn your capabilities and proper form for all exercise.  Introduction to Yoga  Clinical staff conducted group or individual video education with verbal and written material and guidebook.  Patient learns about yoga, a discipline of the coming together of mind, breath, and body. The benefits of yoga include improved flexibility, improved range of motion, better posture and core strength, increased lung function, weight loss, and positive self-image. Yoga's heart health benefits include lowered blood pressure, healthier heart rate, decreased cholesterol and triglyceride levels, improved immune function, and reduced stress.  Seek guidance from your physician and exercise physiologist before implementing an exercise routine and learn your capabilities and proper form for all exercise.  Medical   Aging: Enhancing Your Quality of Life  Clinical staff conducted group or individual video education with verbal and written material and guidebook.  Patient learns key strategies and recommendations to stay in good physical health and enhance quality of life, such as prevention strategies, having an advocate, securing a Health Care Proxy and Power of Attorney, and keeping a list of medications and system for tracking them. It also discusses how to avoid risk for bone loss.  Biology of Weight Control  Clinical staff conducted group or individual video education with verbal and written material and guidebook.  Patient learns that weight gain occurs because we consume more calories than we burn (eating more, moving less). Even if your body weight is normal, you may have higher ratios of fat compared to muscle mass. Too much body fat puts you at increased risk for cardiovascular disease, heart attack, stroke, type 2 diabetes, and obesity-related cancers. In addition to exercise, following the Pritikin Eating Plan can help reduce your risk.  Decoding Lab Results  Clinical staff conducted group or individual video education with verbal and written material and guidebook.  Patient learns that lab test reflects one measurement whose values change over time and are influenced by many factors, including medication, stress, sleep, exercise, food, hydration, pre-existing medical conditions, and more. It is recommended to use the knowledge from this video to become more involved with your lab results and evaluate your numbers to speak with your doctor.   Diseases of Our Time - Overview  Clinical staff conducted group or individual video education with verbal and written material and guidebook.  Patient learns that according to the CDC, 50%  to 70% of chronic diseases (such as obesity, type 2 diabetes, elevated lipids, hypertension, and heart disease) are avoidable through lifestyle improvements including healthier food choices, listening to satiety cues, and increased physical activity.  Sleep Disorders Clinical staff conducted group or individual video education with verbal and written material and guidebook.  Patient learns how good quality and duration of sleep are important to overall health and well-being. Patient also learns about sleep disorders and how they impact health along with recommendations to address them, including discussing with a physician.  Nutrition  Dining Out - Part 2 Clinical staff conducted group or individual video education with verbal and written material and guidebook.  Patient learns how to plan ahead and communicate in order to maximize their dining experience in a healthy and nutritious manner. Included are recommended food choices based on the type of restaurant the patient is visiting.   Fueling a Banker conducted group or individual video education with verbal and  written material and guidebook.  There is a strong connection between our food choices and our health. Diseases like obesity and type 2 diabetes are very prevalent and are in large-part due to lifestyle choices. The Pritikin Eating Plan provides plenty of food and hunger-curbing satisfaction. It is easy to follow, affordable, and helps reduce health risks.  Menu Workshop  Clinical staff conducted group or individual video education with verbal and written material and guidebook.  Patient learns that restaurant meals can sabotage health goals because they are often packed with calories, fat, sodium, and sugar. Recommendations include strategies to plan ahead and to communicate with the manager, chef, or server to help order a healthier meal.  Planning Your Eating Strategy  Clinical staff conducted group or individual  video education with verbal and written material and guidebook.  Patient learns about the Pritikin Eating Plan and its benefit of reducing the risk of disease. The Pritikin Eating Plan does not focus on calories. Instead, it emphasizes high-quality, nutrient-rich foods. By knowing the characteristics of the foods, we choose, we can determine their calorie density and make informed decisions.  Targeting Your Nutrition Priorities  Clinical staff conducted group or individual video education with verbal and written material and guidebook.  Patient learns that lifestyle habits have a tremendous impact on disease risk and progression. This video provides eating and physical activity recommendations based on your personal health goals, such as reducing LDL cholesterol, losing weight, preventing or controlling type 2 diabetes, and reducing high blood pressure.  Vitamins and Minerals  Clinical staff conducted group or individual video education with verbal and written material and guidebook.  Patient learns different ways to obtain key vitamins and minerals, including through a recommended healthy diet. It is important to discuss all supplements you take with your doctor.   Healthy Mind-Set    Smoking Cessation  Clinical staff conducted group or individual video education with verbal and written material and guidebook.  Patient learns that cigarette smoking and tobacco addiction pose a serious health risk which affects millions of people. Stopping smoking will significantly reduce the risk of heart disease, lung disease, and many forms of cancer. Recommended strategies for quitting are covered, including working with your doctor to develop a successful plan.  Culinary   Becoming a Set designer conducted group or individual video education with verbal and written material and guidebook.  Patient learns that cooking at home can be healthy, cost-effective, quick, and puts them in control.  Keys to cooking healthy recipes will include looking at your recipe, assessing your equipment needs, planning ahead, making it simple, choosing cost-effective seasonal ingredients, and limiting the use of added fats, salts, and sugars.  Cooking - Breakfast and Snacks  Clinical staff conducted group or individual video education with verbal and written material and guidebook.  Patient learns how important breakfast is to satiety and nutrition through the entire day. Recommendations include key foods to eat during breakfast to help stabilize blood sugar levels and to prevent overeating at meals later in the day. Planning ahead is also a key component.  Cooking - Educational psychologist conducted group or individual video education with verbal and written material and guidebook.  Patient learns eating strategies to improve overall health, including an approach to cook more at home. Recommendations include thinking of animal protein as a side on your plate rather than center stage and focusing instead on lower calorie dense options like vegetables, fruits, whole grains, and plant-based proteins,  such as beans. Making sauces in large quantities to freeze for later and leaving the skin on your vegetables are also recommended to maximize your experience.  Cooking - Healthy Salads and Dressing Clinical staff conducted group or individual video education with verbal and written material and guidebook.  Patient learns that vegetables, fruits, whole grains, and legumes are the foundations of the Pritikin Eating Plan. Recommendations include how to incorporate each of these in flavorful and healthy salads, and how to create homemade salad dressings. Proper handling of ingredients is also covered. Cooking - Soups and [employer] - Soups and Desserts Clinical staff conducted group or individual video education with verbal and written material and guidebook.  Patient learns that Pritikin soups and  desserts make for easy, nutritious, and delicious snacks and meal components that are low in sodium, fat, sugar, and calorie density, while high in vitamins, minerals, and filling fiber. Recommendations include simple and healthy ideas for soups and desserts.   Overview     The Pritikin Solution Program Overview Clinical staff conducted group or individual video education with verbal and written material and guidebook.  Patient learns that the results of the Pritikin Program have been documented in more than 100 articles published in peer-reviewed journals, and the benefits include reducing risk factors for (and, in some cases, even reversing) high cholesterol, high blood pressure, type 2 diabetes, obesity, and more! An overview of the three key pillars of the Pritikin Program will be covered: eating well, doing regular exercise, and having a healthy mind-set.  WORKSHOPS  Exercise: Exercise Basics: Building Your Action Plan Clinical staff led group instruction and group discussion with PowerPoint presentation and patient guidebook. To enhance the learning environment the use of posters, models and videos may be added. At the conclusion of this workshop, patients will comprehend the difference between physical activity and exercise, as well as the benefits of incorporating both, into their routine. Patients will understand the FITT (Frequency, Intensity, Time, and Type) principle and how to use it to build an exercise action plan. In addition, safety concerns and other considerations for exercise and cardiac rehab will be addressed by the presenter. The purpose of this lesson is to promote a comprehensive and effective weekly exercise routine in order to improve patients' overall level of fitness.   Managing Heart Disease: Your Path to a Healthier Heart Clinical staff led group instruction and group discussion with PowerPoint presentation and patient guidebook. To enhance the learning environment  the use of posters, models and videos may be added.At the conclusion of this workshop, patients will understand the anatomy and physiology of the heart. Additionally, they will understand how Pritikin's three pillars impact the risk factors, the progression, and the management of heart disease.  The purpose of this lesson is to provide a high-level overview of the heart, heart disease, and how the Pritikin lifestyle positively impacts risk factors.  Exercise Biomechanics Clinical staff led group instruction and group discussion with PowerPoint presentation and patient guidebook. To enhance the learning environment the use of posters, models and videos may be added. Patients will learn how the structural parts of their bodies function and how these functions impact their daily activities, movement, and exercise. Patients will learn how to promote a neutral spine, learn how to manage pain, and identify ways to improve their physical movement in order to promote healthy living. The purpose of this lesson is to expose patients to common physical limitations that impact physical activity. Participants will learn practical ways  to adapt and manage aches and pains, and to minimize their effect on regular exercise. Patients will learn how to maintain good posture while sitting, walking, and lifting.  Balance Training and Fall Prevention  Clinical staff led group instruction and group discussion with PowerPoint presentation and patient guidebook. To enhance the learning environment the use of posters, models and videos may be added. At the conclusion of this workshop, patients will understand the importance of their sensorimotor skills (vision, proprioception, and the vestibular system) in maintaining their ability to balance as they age. Patients will apply a variety of balancing exercises that are appropriate for their current level of function. Patients will understand the common causes for  poor balance, possible solutions to these problems, and ways to modify their physical environment in order to minimize their fall risk. The purpose of this lesson is to teach patients about the importance of maintaining balance as they age and ways to minimize their risk of falling.  WORKSHOPS   Nutrition:  Fueling a Ship broker led group instruction and group discussion with PowerPoint presentation and patient guidebook. To enhance the learning environment the use of posters, models and videos may be added. Patients will review the foundational principles of the Pritikin Eating Plan and understand what constitutes a serving size in each of the food groups. Patients will also learn Pritikin-friendly foods that are better choices when away from home and review make-ahead meal and snack options. Calorie density will be reviewed and applied to three nutrition priorities: weight maintenance, weight loss, and weight gain. The purpose of this lesson is to reinforce (in a group setting) the key concepts around what patients are recommended to eat and how to apply these guidelines when away from home by planning and selecting Pritikin-friendly options. Patients will understand how calorie density may be adjusted for different weight management goals.  Mindful Eating  Clinical staff led group instruction and group discussion with PowerPoint presentation and patient guidebook. To enhance the learning environment the use of posters, models and videos may be added. Patients will briefly review the concepts of the Pritikin Eating Plan and the importance of low-calorie dense foods. The concept of mindful eating will be introduced as well as the importance of paying attention to internal hunger signals. Triggers for non-hunger eating and techniques for dealing with triggers will be explored. The purpose of this lesson is to provide patients with the opportunity to review the basic principles of the  Pritikin Eating Plan, discuss the value of eating mindfully and how to measure internal cues of hunger and fullness using the Hunger Scale. Patients will also discuss reasons for non-hunger eating and learn strategies to use for controlling emotional eating.  Targeting Your Nutrition Priorities Clinical staff led group instruction and group discussion with PowerPoint presentation and patient guidebook. To enhance the learning environment the use of posters, models and videos may be added. Patients will learn how to determine their genetic susceptibility to disease by reviewing their family history. Patients will gain insight into the importance of diet as part of an overall healthy lifestyle in mitigating the impact of genetics and other environmental insults. The purpose of this lesson is to provide patients with the opportunity to assess their personal nutrition priorities by looking at their family history, their own health history and current risk factors. Patients will also be able to discuss ways of prioritizing and modifying the Pritikin Eating Plan for their highest risk areas  Menu  Clinical staff led group  instruction and group discussion with PowerPoint presentation and patient guidebook. To enhance the learning environment the use of posters, models and videos may be added. Using menus brought in from [employer], or printed from [employer], patients will apply the Pritikin dining out guidelines that were presented in the [employer] video. Patients will also be able to practice these guidelines in a variety of provided scenarios. The purpose of this lesson is to provide patients with the opportunity to practice hands-on learning of the Pritikin Dining Out guidelines with actual menus and practice scenarios.  Label Reading Clinical staff led group instruction and group discussion with PowerPoint presentation and patient guidebook. To enhance the learning environment  the use of posters, models and videos may be added. Patients will review and discuss the Pritikin label reading guidelines presented in [employer]'s Label Reading Educational series video. Using fool labels brought in from local grocery stores and markets, patients will apply the label reading guidelines and determine if the packaged food meet the Pritikin guidelines. The purpose of this lesson is to provide patients with the opportunity to review, discuss, and practice hands-on learning of the Pritikin Label Reading guidelines with actual packaged food labels. Cooking School  [employer]'s [employer] are designed to teach patients ways to prepare quick, simple, and affordable recipes at home. The importance of nutrition's role in chronic disease risk reduction is reflected in its emphasis in the overall Pritikin program. By learning how to prepare essential core Pritikin Eating Plan recipes, patients will increase control over what they eat; be able to customize the flavor of foods without the use of added salt, sugar, or fat; and improve the quality of the food they consume. By learning a set of core recipes which are easily assembled, quickly prepared, and affordable, patients are more likely to prepare more healthy foods at home. These workshops focus on convenient breakfasts, simple entres, side dishes, and desserts which can be prepared with minimal effort and are consistent with nutrition recommendations for cardiovascular risk reduction. Cooking [employer] are taught by a Armed forces logistics/support/administrative officer (RD) who has been trained by the [employer]. The chef or RD has a clear understanding of the importance of minimizing - if not completely eliminating - added fat, sugar, and sodium in recipes. Throughout the series of Cooking School Workshop sessions, patients will learn about healthy ingredients and efficient methods of cooking to build confidence in their capability to  prepare    Cooking School weekly topics:  Adding Flavor- Sodium-Free  Fast and Healthy Breakfasts  Powerhouse Plant-Based Proteins  Satisfying Salads and Dressings  Simple Sides and Sauces  International Cuisine-Spotlight on the [employer] Zones  Delicious Desserts  Savory Soups  [employer] - Meals in a Astronomer Appetizers and Snacks  Comforting Weekend Breakfasts  One-Pot Wonders   Fast Evening Meals  Landscape architect Your Pritikin Plate  WORKSHOPS   Healthy Mindset (Psychosocial):  Focused Goals, Sustainable Changes Clinical staff led group instruction and group discussion with PowerPoint presentation and patient guidebook. To enhance the learning environment the use of posters, models and videos may be added. Patients will be able to apply effective goal setting strategies to establish at least one personal goal, and then take consistent, meaningful action toward that goal. They will learn to identify common barriers to achieving personal goals and develop strategies to overcome them. Patients will also gain an understanding of how our mind-set can impact our ability to  achieve goals and the importance of cultivating a positive and growth-oriented mind-set. The purpose of this lesson is to provide patients with a deeper understanding of how to set and achieve personal goals, as well as the tools and strategies needed to overcome common obstacles which may arise along the way.  From Head to Heart: The Power of a Healthy Outlook  Clinical staff led group instruction and group discussion with PowerPoint presentation and patient guidebook. To enhance the learning environment the use of posters, models and videos may be added. Patients will be able to recognize and describe the impact of emotions and mood on physical health. They will discover the importance of self-care and explore self-care practices which may work for them. Patients will also learn how to utilize  the 4 C's to cultivate a healthier outlook and better manage stress and challenges. The purpose of this lesson is to demonstrate to patients how a healthy outlook is an essential part of maintaining good health, especially as they continue their cardiac rehab journey.  Healthy Sleep for a Healthy Heart Clinical staff led group instruction and group discussion with PowerPoint presentation and patient guidebook. To enhance the learning environment the use of posters, models and videos may be added. At the conclusion of this workshop, patients will be able to demonstrate knowledge of the importance of sleep to overall health, well-being, and quality of life. They will understand the symptoms of, and treatments for, common sleep disorders. Patients will also be able to identify daytime and nighttime behaviors which impact sleep, and they will be able to apply these tools to help manage sleep-related challenges. The purpose of this lesson is to provide patients with a general overview of sleep and outline the importance of quality sleep. Patients will learn about a few of the most common sleep disorders. Patients will also be introduced to the concept of "sleep hygiene," and discover ways to self-manage certain sleeping problems through simple daily behavior changes. Finally, the workshop will motivate patients by clarifying the links between quality sleep and their goals of heart-healthy living.   Recognizing and Reducing Stress Clinical staff led group instruction and group discussion with PowerPoint presentation and patient guidebook. To enhance the learning environment the use of posters, models and videos may be added. At the conclusion of this workshop, patients will be able to understand the types of stress reactions, differentiate between acute and chronic stress, and recognize the impact that chronic stress has on their health. They will also be able to apply different coping mechanisms, such as reframing  negative self-talk. Patients will have the opportunity to practice a variety of stress management techniques, such as deep abdominal breathing, progressive muscle relaxation, and/or guided imagery.  The purpose of this lesson is to educate patients on the role of stress in their lives and to provide healthy techniques for coping with it.  Learning Barriers/Preferences:  Learning Barriers/Preferences - 01/01/23 1015       Learning Barriers/Preferences   Learning Barriers None   Wears reading glasses   Learning Preferences Skilled Demonstration;Video             Education Topics:  Knowledge Questionnaire Score:  Knowledge Questionnaire Score - 01/01/23 1249       Knowledge Questionnaire Score   Pre Score 23/28             Core Components/Risk Factors/Patient Goals at Admission:  Personal Goals and Risk Factors at Admission - 01/01/23 0954       Core  Components/Risk Factors/Patient Goals on Admission   Tobacco Cessation Yes    Number of packs per day 0.5    Intervention Assist the participant in steps to quit. Provide individualized education and counseling about committing to Tobacco Cessation, relapse prevention, and pharmacological support that can be provided by physician.;Education officer, environmental, assist with locating and accessing local/national Quit Smoking programs, and support quit date choice.    Expected Outcomes Short Term: Will demonstrate readiness to quit, by selecting a quit date.;Long Term: Complete abstinence from all tobacco products for at least 12 months from quit date.;Short Term: Will quit all tobacco product use, adhering to prevention of relapse plan.    Hypertension Yes    Intervention Provide education on lifestyle modifcations including regular physical activity/exercise, weight management, moderate sodium restriction and increased consumption of fresh fruit, vegetables, and low fat dairy, alcohol moderation, and smoking cessation.;Monitor  prescription use compliance.    Expected Outcomes Short Term: Continued assessment and intervention until BP is < 140/1mm HG in hypertensive participants. < 130/37mm HG in hypertensive participants with diabetes, heart failure or chronic kidney disease.;Long Term: Maintenance of blood pressure at goal levels.    Lipids Yes    Intervention Provide education and support for participant on nutrition & aerobic/resistive exercise along with prescribed medications to achieve LDL 70mg , HDL >40mg .    Expected Outcomes Short Term: Participant states understanding of desired cholesterol values and is compliant with medications prescribed. Participant is following exercise prescription and nutrition guidelines.;Long Term: Cholesterol controlled with medications as prescribed, with individualized exercise RX and with personalized nutrition plan. Value goals: LDL < 70mg , HDL > 40 mg.    Personal Goal Other Yes    Personal Goal Increase motivation to be more active. Enjoy life. Feel good about himself.    Intervention Patient will participate in the intensive cardiac rehab program and attend workshops, video lectures on building an exercise plan and having a healthy mindset.    Expected Outcomes Patient will be more active,compliant with exericse plan and have a better out look as measured by self-report.             Core Components/Risk Factors/Patient Goals Review:   Goals and Risk Factor Review     Row Name 01/10/23 1547 02/05/23 1513           Core Components/Risk Factors/Patient Goals Review   Personal Goals Review Weight Management/Obesity;Hypertension;Lipids;Stress Weight Management/Obesity;Hypertension;Lipids;Stress      Review Alan White started intensive cardiac rehab on 01/07/23 and is off to a good start to exercise. vital signs stable. Dr Jodie Echevaria notified of low systolic BP on Monday. No new order received. Will continue to monitor BP. Will continue to encourage smoking cessation Alan White has been  doing well with exercise at intensive cardiac rehab. Vital signs have been stable. Alan White has lost 1 kg since starting cardiac rehab      Expected Outcomes Alan White will continue to participate in intensive cardiac rehab for exercise, nutrition and lifestyle modification Alan White will continue to participate in intensive cardiac rehab for exercise, nutrition and lifestyle modification               Core Components/Risk Factors/Patient Goals at Discharge (Final Review):   Goals and Risk Factor Review - 02/05/23 1513       Core Components/Risk Factors/Patient Goals Review   Personal Goals Review Weight Management/Obesity;Hypertension;Lipids;Stress    Review Alan White has been doing well with exercise at intensive cardiac rehab. Vital signs have been stable. Alan White has lost 1 kg  since starting cardiac rehab    Expected Outcomes Alan White will continue to participate in intensive cardiac rehab for exercise, nutrition and lifestyle modification             ITP Comments:  ITP Comments     Row Name 01/01/23 0919 01/10/23 1540 02/05/23 1508       ITP Comments Medical Director- Dr. Armanda Magic, MD. Oriented patient to the [facility] Education Program / Intensive Cardiac Rehab. Reviewed intitial orientation folder with patient. 30 Day ITP Review. Alan White started inteisvie cardiac rehab on 01/07/23 and is off to a good start to exercise 30 Day ITP Review. Alan White has good attendance and participation in  intensive cardiac rehab.              Comments: See ITP Comments

## 2023-02-06 ENCOUNTER — Encounter (HOSPITAL_COMMUNITY)
Admission: RE | Admit: 2023-02-06 | Discharge: 2023-02-06 | Disposition: A | Discharge status: Home / Self Care | Payer: No Typology Code available for payment source | Source: Ambulatory Visit | Attending: Cardiology | Admitting: Cardiology

## 2023-02-06 DIAGNOSIS — I255 Ischemic cardiomyopathy: Secondary | ICD-10-CM

## 2023-02-08 ENCOUNTER — Encounter (HOSPITAL_COMMUNITY)
Admission: RE | Admit: 2023-02-08 | Discharge: 2023-02-08 | Disposition: A | Discharge status: Home / Self Care | Payer: No Typology Code available for payment source | Source: Ambulatory Visit | Attending: Cardiology

## 2023-02-08 DIAGNOSIS — I255 Ischemic cardiomyopathy: Secondary | ICD-10-CM

## 2023-02-11 ENCOUNTER — Encounter (HOSPITAL_COMMUNITY)
Admission: RE | Admit: 2023-02-11 | Discharge: 2023-02-11 | Disposition: A | Discharge status: Home / Self Care | Payer: No Typology Code available for payment source | Source: Ambulatory Visit | Attending: Cardiology | Admitting: Cardiology

## 2023-02-11 DIAGNOSIS — I255 Ischemic cardiomyopathy: Secondary | ICD-10-CM

## 2023-02-13 ENCOUNTER — Encounter (HOSPITAL_COMMUNITY)
Admission: RE | Admit: 2023-02-13 | Discharge: 2023-02-13 | Disposition: A | Discharge status: Home / Self Care | Payer: No Typology Code available for payment source | Source: Ambulatory Visit | Attending: Cardiology

## 2023-02-13 DIAGNOSIS — I255 Ischemic cardiomyopathy: Secondary | ICD-10-CM | POA: Diagnosis not present

## 2023-02-15 ENCOUNTER — Encounter (HOSPITAL_COMMUNITY)
Admission: RE | Admit: 2023-02-15 | Discharge: 2023-02-15 | Disposition: A | Discharge status: Home / Self Care | Payer: No Typology Code available for payment source | Source: Ambulatory Visit | Attending: Cardiology

## 2023-02-15 DIAGNOSIS — I255 Ischemic cardiomyopathy: Secondary | ICD-10-CM

## 2023-02-18 ENCOUNTER — Encounter (HOSPITAL_COMMUNITY)
Admission: RE | Admit: 2023-02-18 | Discharge: 2023-02-18 | Disposition: A | Discharge status: Home / Self Care | Payer: No Typology Code available for payment source | Source: Ambulatory Visit | Attending: Cardiology

## 2023-02-18 DIAGNOSIS — I255 Ischemic cardiomyopathy: Secondary | ICD-10-CM | POA: Diagnosis not present

## 2023-02-20 ENCOUNTER — Encounter (HOSPITAL_COMMUNITY)
Admission: RE | Admit: 2023-02-20 | Discharge: 2023-02-20 | Disposition: A | Discharge status: Home / Self Care | Payer: No Typology Code available for payment source | Source: Ambulatory Visit | Attending: Cardiology

## 2023-02-20 DIAGNOSIS — I255 Ischemic cardiomyopathy: Secondary | ICD-10-CM

## 2023-02-22 ENCOUNTER — Encounter (HOSPITAL_COMMUNITY)
Admission: RE | Admit: 2023-02-22 | Discharge: 2023-02-22 | Disposition: A | Discharge status: Home / Self Care | Payer: No Typology Code available for payment source | Source: Ambulatory Visit | Attending: Cardiology | Admitting: Cardiology

## 2023-02-22 DIAGNOSIS — I255 Ischemic cardiomyopathy: Secondary | ICD-10-CM | POA: Diagnosis not present

## 2023-02-27 ENCOUNTER — Encounter (HOSPITAL_COMMUNITY)
Admission: RE | Admit: 2023-02-27 | Discharge: 2023-02-27 | Disposition: A | Discharge status: Home / Self Care | Payer: No Typology Code available for payment source | Source: Ambulatory Visit | Attending: Cardiology

## 2023-02-27 DIAGNOSIS — I255 Ischemic cardiomyopathy: Secondary | ICD-10-CM

## 2023-03-01 ENCOUNTER — Encounter (HOSPITAL_COMMUNITY): Payer: No Typology Code available for payment source

## 2023-03-04 ENCOUNTER — Encounter (HOSPITAL_COMMUNITY)
Admission: RE | Admit: 2023-03-04 | Discharge: 2023-03-04 | Disposition: A | Discharge status: Home / Self Care | Payer: No Typology Code available for payment source | Source: Ambulatory Visit | Attending: Cardiology | Admitting: Cardiology

## 2023-03-04 VITALS — BP 100/72 | HR 85 | Wt 206.1 lb

## 2023-03-04 DIAGNOSIS — I255 Ischemic cardiomyopathy: Secondary | ICD-10-CM | POA: Insufficient documentation

## 2023-03-05 NOTE — Progress Notes (Signed)
Cardiac Individual Treatment Plan  Patient Details  Name: Alan White MRN: [id number] Date of Birth: January 08, 1961 Referring Provider:   Flowsheet Row INTENSIVE CARDIAC REHAB ORIENT from 01/01/2023 in [facility]  Referring Provider [name], MD  Armanda Magic, MD covering]       Initial Encounter Date:  Flowsheet Row INTENSIVE CARDIAC REHAB ORIENT from 01/01/2023 in [facility]  Date 01/01/23       Visit Diagnosis: Ischemic cardiomyopathy  Patient's Home Medications on Admission:  Current Outpatient Medications:    aspirin EC 81 MG EC tablet, Take 1 tablet (81 mg total) by mouth daily., Disp: 30 tablet, Rfl: 11   atorvastatin (LIPITOR) 80 MG tablet, TAKE 1 TABLET BY MOUTH EVERY DAY, Disp: 90 tablet, Rfl: 2   clopidogrel (PLAVIX) 75 MG tablet, TAKE 1 TABLET BY MOUTH EVERY DAY, Disp: 90 tablet, Rfl: 0   ENTRESTO 24-26 MG, TAKE 1 TABLET BY MOUTH TWICE A DAY (Patient taking differently: 0.5 tablets 2 (two) times daily.), Disp: 60 tablet, Rfl: 11   isosorbide mononitrate (IMDUR) 30 MG 24 hr tablet, 1/2 tablet in the morning, Disp: , Rfl:    metoprolol succinate (TOPROL XL) 25 MG 24 hr tablet, Take 0.5 tablets (12.5 mg total) by mouth daily., Disp: 90 tablet, Rfl: 3   nitroGLYCERIN (NITROSTAT) 0.4 MG SL tablet, Place 1 tablet (0.4 mg total) under the tongue every 5 (five) minutes x 3 doses as needed for chest pain., Disp: 25 tablet, Rfl: 12   pantoprazole (PROTONIX) 40 MG tablet, TAKE 1 TABLET BY MOUTH EVERY DAY, Disp: 90 tablet, Rfl: 3   spironolactone (ALDACTONE) 25 MG tablet, TAKE 1/2 TABLET BY MOUTH EVERY DAY, Disp: 45 tablet, Rfl: 4  Past Medical History: Past Medical History:  Diagnosis Date   AICD (automatic cardioverter/defibrillator) present    [employer] ([employer]) [employer] ICD   CAD (coronary artery disease), 12/29/13 PCI/DES LCX and PCI/DES LAD with  overlapping DES  12/30/2013   cath 07/05/14 OK   CHF (congestive heart failure) (HCC)    Dyslipidemia, goal LDL below 70 12/30/2013   GERD (gastroesophageal reflux disease)    Metabolic syndrome, HgbA1C 6.0  12/30/2013   Myocardial infarction (HCC) 2015,2020   Sleep apnea    by history - patient denies this dx as of 08/01/20   Smoker    Wears partial dentures    upper    Tobacco Use: Social History   Tobacco Use  Smoking Status Every Day   Packs/day: 0.50   Years: 50.00   Additional pack years: 0.00   Total pack years: 25.00   Types: Cigarettes  Smokeless Tobacco Never    Labs: Review Flowsheet  More data exists      Latest Ref Rng & Units 05/29/2018 01/31/2019 05/19/2019 04/20/2020 08/02/2020  Labs for ITP Cardiac and Pulmonary Rehab  Cholestrol 100 - 199 mg/dL 295  - 284  132  -  LDL (calc) 0 - 99 mg/dL 440  - 60  67  -  HDL-C >39 mg/dL 27  - 28  32  -  Trlycerides 0 - 149 mg/dL 102  - 725  95  -  Hemoglobin A1c 4.8 - 5.6 % - - 5.8  - -  PH, Arterial 7.350 - 7.450 - 7.149  - - -  PCO2 arterial 32.0 - 48.0 mmHg - 47.6  - - -  Bicarbonate 20.0 - 28.0 mmol/L -  16.5  - - -  TCO2 22 - 32 mmol/L - 18  - - 24   Acid-base deficit 0.0 - 2.0 mmol/L - 12.0  - - -  O2 Saturation % - 81.0  - - -    Capillary Blood Glucose: Lab Results  Component Value Date   GLUCAP 193 (H) 08/02/2020   GLUCAP 181 (H) 08/02/2020   GLUCAP 130 (H) 08/02/2020     Exercise Target Goals: Exercise Program Goal: Individual exercise prescription set using results from initial 6 min walk test and THRR while considering  patient's activity barriers and safety.   Exercise Prescription Goal: Initial exercise prescription builds to 30-45 minutes a day of aerobic activity, 2-3 days per week.  Home exercise guidelines will be given to patient during program as part of exercise prescription that the participant will acknowledge.  Activity Barriers & Risk Stratification:  Activity Barriers & Cardiac Risk  Stratification - 01/01/23 0937       Activity Barriers & Cardiac Risk Stratification   Activity Barriers None    Cardiac Risk Stratification High             6 Minute Walk:  6 Minute Walk     Row Name 01/01/23 1045         6 Minute Walk   Phase Initial     Distance 1444 feet     Walk Time 6 minutes     # of Rest Breaks 0     MPH 2.73     METS 3.28     RPE 9     Perceived Dyspnea  0.5     VO2 Peak 11.47     Symptoms Yes (comment)     Comments Mild shortness of breath.     Resting HR 68 bpm     Resting BP 118/76     Resting Oxygen Saturation  97 %     Exercise Oxygen Saturation  during 6 min walk 98 %     Max Ex. HR 86 bpm     Max Ex. BP 110/72     2 Minute Post BP 118/66              Oxygen Initial Assessment:   Oxygen Re-Evaluation:   Oxygen Discharge (Final Oxygen Re-Evaluation):   Initial Exercise Prescription:  Initial Exercise Prescription - 01/01/23 1200       Date of Initial Exercise RX and Referring Provider   Date 01/01/23    Referring Provider [name], MD   Armanda Magic, MD covering   Expected Discharge Date 03/22/23      Treadmill   MPH 1.7    Grade 0    Minutes 15    METs 2.3      NuStep   Level 2    SPM 85    Minutes 15    METs 2.3      Prescription Details   Frequency (times per week) 3    Duration Progress to 30 minutes of continuous aerobic without signs/symptoms of physical distress      Intensity   THRR 40-80% of Max Heartrate 63-126    Ratings of Perceived Exertion 11-13    Perceived Dyspnea 0-4      Progression   Progression Continue to progress workloads to maintain intensity without signs/symptoms of physical distress.      Resistance Training   Training Prescription Yes    Weight 4 lbs    Reps 10-15  Perform Capillary Blood Glucose checks as needed.  Exercise Prescription Changes:   Exercise Prescription Changes     Row Name 01/07/23 1022 01/28/23 1030 02/11/23 1029  03/04/23 1008       Response to Exercise   Blood Pressure (Admit) 102/70 118/52 110/68 100/72    Blood Pressure (Exercise) 122/72 108/80 126/66 118/60    Blood Pressure (Exit) 90/62 98/60 90/54  94/62    Heart Rate (Admit) 73 bpm 86 bpm 84 bpm 85 bpm    Heart Rate (Exercise) 106 bpm 113 bpm 131 bpm 120 bpm    Heart Rate (Exit) 78 bpm 92 bpm 88 bpm 96 bpm    Rating of Perceived Exertion (Exercise) 10 12 12 12     Symptoms None None None None    Comments Off to an excellent start with exercise. Patient increases TM about halfway from 2.5 to 3.0 mph with no incline. -- Reviewed METs with patient.    Duration Continue with 30 min of aerobic exercise without signs/symptoms of physical distress. Continue with 30 min of aerobic exercise without signs/symptoms of physical distress. Continue with 30 min of aerobic exercise without signs/symptoms of physical distress. Continue with 30 min of aerobic exercise without signs/symptoms of physical distress.    Intensity THRR unchanged THRR unchanged THRR unchanged THRR unchanged      Progression   Progression Continue to progress workloads to maintain intensity without signs/symptoms of physical distress. Continue to progress workloads to maintain intensity without signs/symptoms of physical distress. Continue to progress workloads to maintain intensity without signs/symptoms of physical distress. Continue to progress workloads to maintain intensity without signs/symptoms of physical distress.    Average METs 3 3.4 3.9 4.1      Resistance Training   Training Prescription Yes Yes Yes Yes    Weight 4 lbs 4 lbs 5 lbs 5 lbs    Reps 10-15 10-15 10-15 10-15    Time 10 Minutes 10 Minutes 10 Minutes 10 Minutes      Interval Training   Interval Training No No Yes No    Equipment -- -- Treadmill --    Comments -- -- 2.5/1.0 for 7 mins, 3.0/1.5 8 mins --      Treadmill   MPH 2.5 3  2.5 to 3.0 mph halfway through 3  2.5 to 3.0 mph halfway through 3    Grade 0 0  1.5  1.0 to 1.5% halfway 1.5    Minutes 15 15 15 15     METs 2.91 2.91 3.59 3.92      NuStep   Level 4 4 4 4     SPM 88 88 114 108    Minutes 15 15 15 15     METs 3.2 3.2 4.6 4.2      Home Exercise Plan   Plans to continue exercise at -- Home (comment)  Treadmill, walking Home (comment)  Treadmill, walking Home (comment)  Treadmill, walking    Frequency -- Add 2 additional days to program exercise sessions. Add 2 additional days to program exercise sessions. Add 2 additional days to program exercise sessions.    Initial Home Exercises Provided -- 01/28/23 01/28/23 01/28/23             Exercise Comments:   Exercise Comments     Row Name 01/07/23 1132 01/28/23 1046 02/13/23 1124 02/22/23 1040 03/04/23 1108   Exercise Comments Patient tolerated first session of exercise well without symptoms. Oriented to warm-up / cool-down routine and equipment. Reviewed home exercise guidelines, METs, and  goals with patient. Reviewed goals with patient. Reviewed goals with patient. Reviewed METs with patient.            Exercise Goals and Review:   Exercise Goals     Row Name 01/01/23 0946             Exercise Goals   Increase Physical Activity Yes       Intervention Provide advice, education, support and counseling about physical activity/exercise needs.;Develop an individualized exercise prescription for aerobic and resistive training based on initial evaluation findings, risk stratification, comorbidities and participant's personal goals.       Expected Outcomes Short Term: Attend rehab on a regular basis to increase amount of physical activity.;Long Term: Add in home exercise to make exercise part of routine and to increase amount of physical activity.;Long Term: Exercising regularly at least 3-5 days a week.       Increase Strength and Stamina Yes       Intervention Provide advice, education, support and counseling about physical activity/exercise needs.;Develop an individualized  exercise prescription for aerobic and resistive training based on initial evaluation findings, risk stratification, comorbidities and participant's personal goals.       Expected Outcomes Short Term: Increase workloads from initial exercise prescription for resistance, speed, and METs.;Short Term: Perform resistance training exercises routinely during rehab and add in resistance training at home;Long Term: Improve cardiorespiratory fitness, muscular endurance and strength as measured by increased METs and functional capacity ( )       Able to understand and use rate of perceived exertion (RPE) scale Yes       Intervention Provide education and explanation on how to use RPE scale       Expected Outcomes Short Term: Able to use RPE daily in rehab to express subjective intensity level;Long Term:  Able to use RPE to guide intensity level when exercising independently       Knowledge and understanding of Target Heart Rate Range (THRR) Yes       Intervention Provide education and explanation of THRR including how the numbers were predicted and where they are located for reference       Expected Outcomes Short Term: Able to state/look up THRR;Long Term: Able to use THRR to govern intensity when exercising independently;Short Term: Able to use daily as guideline for intensity in rehab       Able to check pulse independently Yes       Intervention Provide education and demonstration on how to check pulse in carotid and radial arteries.;Review the importance of being able to check your own pulse for safety during independent exercise       Expected Outcomes Short Term: Able to explain why pulse checking is important during independent exercise;Long Term: Able to check pulse independently and accurately       Understanding of Exercise Prescription Yes       Intervention Provide education, explanation, and written materials on patient's individual exercise prescription       Expected Outcomes Short Term: Able to  explain program exercise prescription;Long Term: Able to explain home exercise prescription to exercise independently                Exercise Goals Re-Evaluation :  Exercise Goals Re-Evaluation     Row Name 01/07/23 1132 01/28/23 1046 02/13/23 1124 02/22/23 1040       Exercise Goal Re-Evaluation   Exercise Goals Review Increase Physical Activity;Increase Strength and Stamina;Able to understand and use rate of perceived exertion (RPE)  scale Increase Physical Activity;Increase Strength and Stamina;Able to understand and use rate of perceived exertion (RPE) scale;Knowledge and understanding of Target Heart Rate Range (THRR);Understanding of Exercise Prescription Increase Physical Activity;Increase Strength and Stamina;Able to understand and use rate of perceived exertion (RPE) scale;Knowledge and understanding of Target Heart Rate Range (THRR);Understanding of Exercise Prescription Increase Physical Activity;Increase Strength and Stamina;Able to understand and use rate of perceived exertion (RPE) scale;Knowledge and understanding of Target Heart Rate Range (THRR);Understanding of Exercise Prescription    Comments Patient able to understand and use RPE scale appropriately. Patient states he's more motivated, has more energy and is eating healhtier. Patient started using his treadmill yesterday for 15 minutes at 3.0/0.0 and tolerated well. Patient's nicotine intake has decreased from 10 cigarettes to 8 cigarettes/day. Patient is still SOB going up hills. Will add incline to TM prescription at cardiac rehab to help decrease SOB with inclines. Reviewed exercise prescription. Patient continues to progress well and is pleased with his progress. He is tolerating incline on the treadmill fine without problem. He would like to increase incline on the TM next week. He feels that his legs are stronger, and he has less shortness of breath. Patient is tolerating the increased speed and incline on the treadmill well  and plans to increase incline next week from 1.5% to 2.0%. Patient still has some shortness of breath but feels it's less since medication change from Carvedilol to Metoprolol. He states he feels more motivated and feels better overall.    Expected Outcomes Progress workloads as tolerated to help improve cardiorespiratory fitness. Patient will exercise at least 30 minutes 2 days/week at home. Will add incline to TM at CR. Will increase incline on the TM to continue to increase strength and stamina. Continue to progress workloads as tolerated.             Discharge Exercise Prescription (Final Exercise Prescription Changes):  Exercise Prescription Changes - 03/04/23 1008       Response to Exercise   Blood Pressure (Admit) 100/72    Blood Pressure (Exercise) 118/60    Blood Pressure (Exit) 94/62    Heart Rate (Admit) 85 bpm    Heart Rate (Exercise) 120 bpm    Heart Rate (Exit) 96 bpm    Rating of Perceived Exertion (Exercise) 12    Symptoms None    Comments Reviewed METs with patient.    Duration Continue with 30 min of aerobic exercise without signs/symptoms of physical distress.    Intensity THRR unchanged      Progression   Progression Continue to progress workloads to maintain intensity without signs/symptoms of physical distress.    Average METs 4.1      Resistance Training   Training Prescription Yes    Weight 5 lbs    Reps 10-15    Time 10 Minutes      Interval Training   Interval Training No      Treadmill   MPH 3    Grade 1.5    Minutes 15    METs 3.92      NuStep   Level 4    SPM 108    Minutes 15    METs 4.2      Home Exercise Plan   Plans to continue exercise at Home (comment)   Treadmill, walking   Frequency Add 2 additional days to program exercise sessions.    Initial Home Exercises Provided 01/28/23             Nutrition:  Target Goals: Understanding of nutrition guidelines, daily intake of sodium 1500mg , cholesterol 200mg , calories 30%  from fat and 7% or less from saturated fats, daily to have 5 or more servings of fruits and vegetables.  Biometrics:  Pre Biometrics - 01/01/23 0919       Pre Biometrics   Waist Circumference 44.25 inches    Hip Circumference 43.75 inches    Waist to Hip Ratio 1.01 %    Triceps Skinfold 22 mm    % Body Fat 30.9 %    Grip Strength 40 kg    Flexibility 10.88 in    Single Leg Stand 13.93 seconds              Nutrition Therapy Plan and Nutrition Goals:  Nutrition Therapy & Goals - 03/04/23 1132       Nutrition Therapy   Diet Heart Healthy Diet    Drug/Food Interactions Statins/Certain Fruits      Personal Nutrition Goals   Nutrition Goal Patient to identify strategies for reducing cardiovascular risk by attending the [employer] education and nutrition series weekly.    Personal Goal #2 Patient to improve diet quality by using the plate method as a guide for meal planning to include lean protein/plant protein, fruits, vegetables, whole grains, nonfat dairy as part of a well-balanced diet.    Personal Goal #3 Patient to reduce sodium intake to 1500mg  per day    Personal Goal #4 Patient to identify food sources and limit daily intake of saturated fat, trans fat, sodium, and refined carbohydrates.    Comments Goals in action. Alan White has not attended the [employer] and nutrition series regularly over the last month/30 days. He has started making many dietary changes including reduced mountain dew from 5/day to 3/day, drastically reduced fried foods and decreased portions of meat, reduced snacking on refined carbohydrates (chips) and switched to fruit, and increased non-starchy vegetables at dinner daily. Alan White remains motivated to continue to improve food choices. He is down 4.2# since starting with our program. [facility]. Alan White will benefit from participation in intensive cardiac rehab for nutrition, exercise, and lifestyle  modification.      Intervention Plan   Intervention Prescribe, educate and counsel regarding individualized specific dietary modifications aiming towards targeted core components such as weight, hypertension, lipid management, diabetes, heart failure and other comorbidities.;Nutrition handout(s) given to patient.    Expected Outcomes Short Term Goal: Understand basic principles of dietary content, such as calories, fat, sodium, cholesterol and nutrients.;Long Term Goal: Adherence to prescribed nutrition plan.             Nutrition Assessments:  MEDIFICTS Score Key: ?70 Need to make dietary changes  40-70 Heart Healthy Diet ? 40 Therapeutic Level Cholesterol Diet    Picture Your Plate Scores: <16 Unhealthy dietary pattern with much room for improvement. 41-50 Dietary pattern unlikely to meet recommendations for good health and room for improvement. 51-60 More healthful dietary pattern, with some room for improvement.  >60 Healthy dietary pattern, although there may be some specific behaviors that could be improved.    Nutrition Goals Re-Evaluation:  Nutrition Goals Re-Evaluation     Row Name 01/07/23 1154 02/04/23 1059 03/04/23 1132         Goals   Current Weight 210 lb 5.1 oz (95.4 kg) 206 lb 9.1 oz (93.7 kg) 205 lb 14.6 oz (93.4 kg)     Comment lipids WNL,- HDL 27, A1c 6.1 New labs from 5/1  but have not been released. He has cut back on smoking. no new labs available/released.     Expected Outcome Alan White reports drinking mountain dew (>5/day), eating out 3-4x/week, and over eating frieds foods. He is receptive to making dietary changes and enjoys beans and vegetables. Alan White will benefit from participation in intensive cardiac rehab for nutrition, exercise, and lifestyle modification. Goals in action. Alan White continues to attend the [employer] and nutrition series regularly. He has started making many dietary changes including reduced mountain dew from 5/day to 3/day,  drastically reduced fried foods and decreased portions of meat, reduced snacking on refined carbohydrates (chips) and switched to fruit, and increased non-starchy vegetables at dinner daily. Alan White remains motivated to continue to improve food choices. He is down 3.5# since starting with our program. Alan White will benefit from participation in intensive cardiac rehab for nutrition, exercise, and lifestyle modification. Goals in action. Alan White has not attended the [employer] and nutrition series regularly over the last month/30 days. He has started making many dietary changes including reduced mountain dew from 5/day to 3/day, drastically reduced fried foods and decreased portions of meat, reduced snacking on refined carbohydrates (chips) and switched to fruit, and increased non-starchy vegetables at dinner daily. Alan White remains motivated to continue to improve food choices. He is down 4.2# since starting with our program. [facility]. Alan White will benefit from participation in intensive cardiac rehab for nutrition, exercise, and lifestyle modification.              Nutrition Goals Re-Evaluation:  Nutrition Goals Re-Evaluation     Row Name 01/07/23 1154 02/04/23 1059 03/04/23 1132         Goals   Current Weight 210 lb 5.1 oz (95.4 kg) 206 lb 9.1 oz (93.7 kg) 205 lb 14.6 oz (93.4 kg)     Comment lipids WNL,- HDL 27, A1c 6.1 New labs from 5/1 but have not been released. He has cut back on smoking. no new labs available/released.     Expected Outcome Alan White reports drinking mountain dew (>5/day), eating out 3-4x/week, and over eating frieds foods. He is receptive to making dietary changes and enjoys beans and vegetables. Alan White will benefit from participation in intensive cardiac rehab for nutrition, exercise, and lifestyle modification. Goals in action. Alan White continues to attend the [employer] and nutrition series regularly. He has started making  many dietary changes including reduced mountain dew from 5/day to 3/day, drastically reduced fried foods and decreased portions of meat, reduced snacking on refined carbohydrates (chips) and switched to fruit, and increased non-starchy vegetables at dinner daily. Alan White remains motivated to continue to improve food choices. He is down 3.5# since starting with our program. Alan White will benefit from participation in intensive cardiac rehab for nutrition, exercise, and lifestyle modification. Goals in action. Alan White has not attended the [employer] and nutrition series regularly over the last month/30 days. He has started making many dietary changes including reduced mountain dew from 5/day to 3/day, drastically reduced fried foods and decreased portions of meat, reduced snacking on refined carbohydrates (chips) and switched to fruit, and increased non-starchy vegetables at dinner daily. Alan White remains motivated to continue to improve food choices. He is down 4.2# since starting with our program. [facility]. Alan White will benefit from participation in intensive cardiac rehab for nutrition, exercise, and lifestyle modification.              Nutrition Goals Discharge (  Final Nutrition Goals Re-Evaluation):  Nutrition Goals Re-Evaluation - 03/04/23 1132       Goals   Current Weight 205 lb 14.6 oz (93.4 kg)    Comment no new labs available/released.    Expected Outcome Goals in action. Alan White has not attended the [employer] and nutrition series regularly over the last month/30 days. He has started making many dietary changes including reduced mountain dew from 5/day to 3/day, drastically reduced fried foods and decreased portions of meat, reduced snacking on refined carbohydrates (chips) and switched to fruit, and increased non-starchy vegetables at dinner daily. Alan White remains motivated to continue to improve food choices. He is down 4.2# since starting  with our program. [facility] is [facility]. Alan White will benefit from participation in intensive cardiac rehab for nutrition, exercise, and lifestyle modification.             Psychosocial: Target Goals: Acknowledge presence or absence of significant depression and/or stress, maximize coping skills, provide positive support system. Participant is able to verbalize types and ability to use techniques and skills needed for reducing stress and depression.  Initial Review & Psychosocial Screening:  Initial Psych Review & Screening - 01/01/23 0954       Initial Review   Current issues with Current Sleep Concerns   Patient states he sleeps about 4 hours and then wakes up and has difficulty getting back to sleep, thinking a lot.     Family Dynamics   Good Support System? Yes    Comments Patient has a very supportive wife and family, children.      Barriers   Psychosocial barriers to participate in program The patient should benefit from training in stress management and relaxation.      Screening Interventions   Interventions Encouraged to exercise;To provide support and resources with identified psychosocial needs;Provide feedback about the scores to participant    Expected Outcomes Short Term goal: Identification and review with participant of any Quality of Life or Depression concerns found by scoring the questionnaire.;Long Term goal: The participant improves quality of Life and PHQ9 Scores as seen by post scores and/or verbalization of changes             Quality of Life Scores:  Quality of Life - 01/01/23 1247       Quality of Life   Select Quality of Life      Quality of Life Scores   Health/Function Pre 18.6 %    Socioeconomic Pre 23.79 %    Psych/Spiritual Pre 21.43 %    Family Pre 22.5 %    GLOBAL Pre 20.82 %            Scores of 19 and below usually indicate a poorer quality of life in these areas.  A difference of  2-3  points is a clinically meaningful difference.  A difference of 2-3 points in the total score of the Quality of Life Index has been associated with significant improvement in overall quality of life, self-image, physical symptoms, and general health in studies assessing change in quality of life.  PHQ-9: Review Flowsheet       01/01/2023 03/22/2014  Depression screen PHQ 2/9  Decreased Interest 2 0  Down, Depressed, Hopeless 2 0  PHQ - 2 Score 4 0  Altered sleeping 2 -  Tired, decreased energy 2 -  Change in appetite 2 -  Feeling bad or failure about yourself  2 -  Trouble concentrating 0 -  Moving slowly or fidgety/restless 0 -  Suicidal thoughts 0 -  PHQ-9 Score 12 -  Difficult doing work/chores Somewhat difficult -   Interpretation of Total Score  Total Score Depression Severity:  1-4 = Minimal depression, 5-9 = Mild depression, 10-14 = Moderate depression, 15-19 = Moderately severe depression, 20-27 = Severe depression   Psychosocial Evaluation and Intervention:   Psychosocial Re-Evaluation:  Psychosocial Re-Evaluation     Row Name 01/10/23 1543 02/05/23 1509 03/05/23 1214         Psychosocial Re-Evaluation   Current issues with Current Sleep Concerns Current Sleep Concerns None Identified     Comments Alan White has not voiced  any increased concerns or stressors at cardiac rehab. Will review quality of life and PHQ 9 in the near future Reviewed Kelly's quality of life. Alan White reports that his energy level has imporved since he has been participating in intensive cardiac rehab. Alan White denies being depressed currently Alan White has not voiced any concerns or stressors during exercise at intensive cardiac rehab     Expected Outcomes Alan White will have controlled or decreased stress concerns upon completion of intensive cardiac rehab. Alan White will have controlled or decreased stress concerns upon completion of intensive cardiac rehab. Alan White says that his [place] has imporved since he has been  participating in intensive cardiac rehab. Alan White will complete intensive cardiac rehab on 03/20/23     Interventions Encouraged to attend Cardiac Rehabilitation for the exercise;Relaxation education;Stress management education Encouraged to attend Cardiac Rehabilitation for the exercise;Relaxation education;Stress management education Encouraged to attend Cardiac Rehabilitation for the exercise;Relaxation education;Stress management education     Continue Psychosocial Services  Follow up required by staff No Follow up required No Follow up required              Psychosocial Discharge (Final Psychosocial Re-Evaluation):  Psychosocial Re-Evaluation - 03/05/23 1214       Psychosocial Re-Evaluation   Current issues with None Identified    Comments Alan White has not voiced any concerns or stressors during exercise at intensive cardiac rehab    Expected Outcomes Alan White says that his strees has imporved since he has been participating in intensive cardiac rehab. Alan White will complete intensive cardiac rehab on 03/20/23    Interventions Encouraged to attend Cardiac Rehabilitation for the exercise;Relaxation education;Stress management education    Continue Psychosocial Services  No Follow up required             Vocational Rehabilitation: Provide vocational rehab assistance to qualifying candidates.   Vocational Rehab Evaluation & Intervention:  Vocational Rehab - 01/01/23 1249       Initial Vocational Rehab Evaluation & Intervention   Assessment shows need for Vocational Rehabilitation No      Vocational Rehab Re-Evaulation   Comments Patient on disability, no need for vocational rehab.             Education: Education Goals: Education classes will be provided on a weekly basis, covering required topics. Participant will state understanding/return demonstration of topics presented.    Education     Row Name 01/07/23 1400     Education   Cardiac Education Topics Pritikin    Select Core Videos     Core Videos   Educator Dietitian   Select Nutrition   Nutrition Nutrition Action Plan   Instruction Review Code 1- Verbalizes Understanding   Class Start Time 1145   Class Stop Time 1219   Class Time Calculation (min) 34 min    Row Name 01/09/23 1300  Education   Cardiac Education Topics Pritikin   Environmental education officer - Meals in a Snap   Instruction Review Code 1- Verbalizes Understanding   Class Start Time 1140   Class Stop Time 1220   Class Time Calculation (min) 40 min    Row Name 01/16/23 1300     Education   Cardiac Education Topics Pritikin   Orthoptist   Educator Dietitian   Weekly Topic One-Pot Wonders   Instruction Review Code 1- Verbalizes Understanding   Class Start Time 1140   Class Stop Time 1212   Class Time Calculation (min) 32 min    Row Name 01/18/23 1400     Education   Cardiac Education Topics Pritikin   Hospital doctor Education   General Education Hypertension and Heart Disease   Instruction Review Code 1- Verbalizes Understanding   Class Start Time 1147   Class Stop Time 1220   Class Time Calculation (min) 33 min    Row Name 01/21/23 1300     Education   Cardiac Education Topics Pritikin   Select Core Videos     Core Videos   Educator Nurse   Select Exercise Education   Exercise Education Biomechanial Limitations   Instruction Review Code 1- Verbalizes Understanding   Class Start Time 1156   Class Stop Time 1232   Class Time Calculation (min) 36 min    Row Name 01/23/23 1300     Education   Cardiac Education Topics Pritikin   Customer service manager   Weekly Topic Comforting Weekend Breakfasts   Instruction Review Code 1- Verbalizes Understanding   Class Start Time 1135   Class  Stop Time 1223   Class Time Calculation (min) 48 min    Row Name 01/28/23 1400     Education   Cardiac Education Topics Pritikin   Licensed conveyancer Nutrition   Nutrition Facts on Fat   Instruction Review Code 1- Verbalizes Understanding   Class Start Time 1145   Class Stop Time 1225   Class Time Calculation (min) 40 min    Row Name 02/06/23 1400     Education   Cardiac Education Topics Pritikin   Customer service manager   Weekly Topic International Cuisine- Spotlight on the [employer] Zones   Instruction Review Code 1- Verbalizes Understanding   Class Start Time 1140   Class Stop Time 1212   Class Time Calculation (min) 32 min            Core Videos: Exercise    Move It!  Clinical staff conducted group or individual video education with verbal and written material and guidebook.  Patient learns the recommended Pritikin exercise program. Exercise with the goal of living a long, healthy life. Some of the health benefits of exercise include controlled diabetes, healthier blood pressure levels, improved cholesterol levels, improved heart and lung capacity, improved sleep, and better body composition. Everyone should speak with their doctor before starting or changing an exercise routine.  Biomechanical Limitations Clinical staff conducted group or individual video education with verbal and written material and guidebook.  Patient learns how biomechanical limitations can impact exercise and how we can mitigate and possibly overcome limitations to have an impactful and balanced exercise routine.  Body Composition Clinical staff conducted group or individual video education with verbal and written material and guidebook.  Patient learns that body composition (ratio of muscle mass to fat mass) is a key component to assessing overall fitness, rather than body weight alone. Increased fat mass,  especially visceral belly fat, can put [place] at increased risk for metabolic syndrome, type 2 diabetes, heart disease, and even death. It is recommended to combine diet and exercise (cardiovascular and resistance training) to improve your body composition. Seek guidance from your physician and exercise physiologist before implementing an exercise routine.  Exercise Action Plan Clinical staff conducted group or individual video education with verbal and written material and guidebook.  Patient learns the recommended strategies to achieve and enjoy long-term exercise adherence, including variety, self-motivation, self-efficacy, and positive decision making. Benefits of exercise include fitness, good health, weight management, more energy, better sleep, less stress, and overall well-being.  Medical   Heart Disease Risk Reduction Clinical staff conducted group or individual video education with verbal and written material and guidebook.  Patient learns our heart is our most vital organ as it circulates oxygen, nutrients, white blood cells, and hormones throughout the entire body, and carries waste away. Data supports a plant-based eating plan like the Pritikin Program for its effectiveness in slowing progression of and reversing heart disease. The video provides a number of recommendations to address heart disease.   Metabolic Syndrome and Belly Fat  Clinical staff conducted group or individual video education with verbal and written material and guidebook.  Patient learns what metabolic syndrome is, how it leads to heart disease, and how one can reverse it and keep it from coming back. You have metabolic syndrome if you have 3 of the following 5 criteria: abdominal obesity, high blood pressure, high triglycerides, low HDL cholesterol, and high blood sugar.  Hypertension and Heart Disease Clinical staff conducted group or individual video education with verbal and written material and guidebook.  Patient  learns that high blood pressure, or hypertension, is very common in the [place]. Hypertension is largely due to excessive salt intake, but other important risk factors include being overweight, physical inactivity, drinking too much alcohol, smoking, and not eating enough potassium from fruits and vegetables. High blood pressure is a leading risk factor for heart attack, stroke, congestive heart failure, dementia, kidney failure, and premature death. Long-term effects of excessive salt intake include stiffening of the arteries and thickening of heart muscle and organ damage. Recommendations include ways to reduce hypertension and the risk of heart disease.  Diseases of Our Time - Focusing on Diabetes Clinical staff conducted group or individual video education with verbal and written material and guidebook.  Patient learns why the best way to stop diseases of our time is prevention, through food and other lifestyle changes. Medicine (such as prescription pills and surgeries) is often only a Band-Aid on the problem, not a long-term solution. Most common diseases of our time include obesity, type 2 diabetes, hypertension, heart disease, and cancer. The Pritikin Program is recommended and has been proven to help reduce, reverse, and/or prevent the damaging effects of metabolic syndrome.  Nutrition   Overview of the Pritikin Eating Plan  Clinical staff conducted group or individual video education with verbal and written material and guidebook.  Patient learns about the Pritikin Eating Plan for disease risk reduction.  The Pritikin Eating Plan emphasizes a wide variety of unrefined, minimally-processed carbohydrates, like fruits, vegetables, whole grains, and legumes. Go, Caution, and Stop food choices are explained. Plant-based and lean animal proteins are emphasized. Rationale provided for low sodium intake for blood pressure control, low added sugars for blood sugar stabilization, and low added fats and  oils for coronary artery disease risk reduction and weight management.  Calorie Density  Clinical staff conducted group or individual video education with verbal and written material and guidebook.  Patient learns about calorie density and how it impacts the Pritikin Eating Plan. Knowing the characteristics of the food you choose will help you decide whether those foods will lead to weight gain or weight loss, and whether you want to consume more or less of them. Weight loss is usually a side effect of the Pritikin Eating Plan because of its focus on low calorie-dense foods.  Label Reading  Clinical staff conducted group or individual video education with verbal and written material and guidebook.  Patient learns about the Pritikin recommended label reading guidelines and corresponding recommendations regarding calorie density, added sugars, sodium content, and whole grains.  Dining Out - Part 1  Clinical staff conducted group or individual video education with verbal and written material and guidebook.  Patient learns that restaurant meals can be sabotaging because they can be so high in calories, fat, sodium, and/or sugar. Patient learns recommended strategies on how to positively address this and avoid unhealthy pitfalls.  Facts on Fats  Clinical staff conducted group or individual video education with verbal and written material and guidebook.  Patient learns that lifestyle modifications can be just as effective, if not more so, as many medications for lowering your risk of heart disease. A Pritikin lifestyle can help to reduce your risk of inflammation and atherosclerosis (cholesterol build-up, or plaque, in the artery walls). Lifestyle interventions such as dietary choices and physical activity address the cause of atherosclerosis. A review of the types of fats and their impact on blood cholesterol levels, along with dietary recommendations to reduce fat intake is also included.  Nutrition  Action Plan  Clinical staff conducted group or individual video education with verbal and written material and guidebook.  Patient learns how to incorporate Pritikin recommendations into their lifestyle. Recommendations include planning and keeping personal health goals in mind as an important part of their success.  Healthy Mind-Set    Healthy Minds, Bodies, Hearts  Clinical staff conducted group or individual video education with verbal and written material and guidebook.  Patient learns how to identify when they are stressed. Video will discuss the impact of that stress, as well as the many benefits of stress management. Patient will also be introduced to stress management techniques. The way we think, act, and feel has an impact on our hearts.  How Our Thoughts Can Heal Our Hearts  Clinical staff conducted group or individual video education with verbal and written material and guidebook.  Patient learns that negative thoughts can cause depression and anxiety. This can result in negative lifestyle behavior and serious health problems. Cognitive behavioral therapy is an effective method to help control our thoughts in order to change and improve our emotional outlook.  Additional Videos:  Exercise    Improving Performance  Clinical staff conducted group or individual video education with verbal and written material and guidebook.  Patient learns to use a non-linear approach by alternating intensity levels and lengths of time spent exercising to help burn more calories and lose  more body fat. Cardiovascular exercise helps improve heart health, metabolism, hormonal balance, blood sugar control, and recovery from fatigue. Resistance training improves strength, endurance, balance, coordination, reaction time, metabolism, and muscle mass. Flexibility exercise improves circulation, posture, and balance. Seek guidance from your physician and exercise physiologist before implementing an exercise routine  and learn your capabilities and proper form for all exercise.  Introduction to Yoga  Clinical staff conducted group or individual video education with verbal and written material and guidebook.  Patient learns about yoga, a discipline of the coming together of mind, breath, and body. The benefits of yoga include improved flexibility, improved range of motion, better posture and core strength, increased lung function, weight loss, and positive self-image. Yoga's heart health benefits include lowered blood pressure, healthier heart rate, decreased cholesterol and triglyceride levels, improved immune function, and reduced stress. Seek guidance from your physician and exercise physiologist before implementing an exercise routine and learn your capabilities and proper form for all exercise.  Medical   Aging: Enhancing Your Quality of Life  Clinical staff conducted group or individual video education with verbal and written material and guidebook.  Patient learns key strategies and recommendations to stay in good physical health and enhance quality of life, such as prevention strategies, having an advocate, securing a Health Care Proxy and Power of Attorney, and keeping a list of medications and system for tracking them. It also discusses how to avoid risk for bone loss.  Biology of Weight Control  Clinical staff conducted group or individual video education with verbal and written material and guidebook.  Patient learns that weight gain occurs because we consume more calories than we burn (eating more, moving less). Even if your body weight is normal, you may have higher ratios of fat compared to muscle mass. Too much body fat puts you at increased risk for cardiovascular disease, heart attack, stroke, type 2 diabetes, and obesity-related cancers. In addition to exercise, following the Pritikin Eating Plan can help reduce your risk.  Decoding Lab Results  Clinical staff conducted group or individual video  education with verbal and written material and guidebook.  Patient learns that lab test reflects one measurement whose values change over time and are influenced by many factors, including medication, stress, sleep, exercise, food, hydration, pre-existing medical conditions, and more. It is recommended to use the knowledge from this video to become more involved with your lab results and evaluate your numbers to speak with your doctor.   Diseases of Our Time - Overview  Clinical staff conducted group or individual video education with verbal and written material and guidebook.  Patient learns that according to the CDC, 50% to 70% of chronic diseases (such as obesity, type 2 diabetes, elevated lipids, hypertension, and heart disease) are avoidable through lifestyle improvements including healthier food choices, listening to satiety cues, and increased physical activity.  Sleep Disorders Clinical staff conducted group or individual video education with verbal and written material and guidebook.  Patient learns how good quality and duration of sleep are important to overall health and well-being. Patient also learns about sleep disorders and how they impact health along with recommendations to address them, including discussing with a physician.  Nutrition  Dining Out - Part 2 Clinical staff conducted group or individual video education with verbal and written material and guidebook.  Patient learns how to plan ahead and communicate in order to maximize their dining experience in a healthy and nutritious manner. Included are recommended food choices based on the type  of restaurant the patient is visiting.   Fueling a Banker conducted group or individual video education with verbal and written material and guidebook.  There is a strong connection between our food choices and our health. Diseases like obesity and type 2 diabetes are very prevalent and are in large-part due to  lifestyle choices. The Pritikin Eating Plan provides plenty of food and hunger-curbing satisfaction. It is easy to follow, affordable, and helps reduce health risks.  Menu Workshop  Clinical staff conducted group or individual video education with verbal and written material and guidebook.  Patient learns that restaurant meals can sabotage health goals because they are often packed with calories, fat, sodium, and sugar. Recommendations include strategies to plan ahead and to communicate with the manager, chef, or server to help order a healthier meal.  Planning Your Eating Strategy  Clinical staff conducted group or individual video education with verbal and written material and guidebook.  Patient learns about the Pritikin Eating Plan and its benefit of reducing the risk of disease. The Pritikin Eating Plan does not focus on calories. Instead, it emphasizes high-quality, nutrient-rich foods. By knowing the characteristics of the foods, we choose, we can determine their calorie density and make informed decisions.  Targeting Your Nutrition Priorities  Clinical staff conducted group or individual video education with verbal and written material and guidebook.  Patient learns that lifestyle habits have a tremendous impact on disease risk and progression. This video provides eating and physical activity recommendations based on your personal health goals, such as reducing LDL cholesterol, losing weight, preventing or controlling type 2 diabetes, and reducing high blood pressure.  Vitamins and Minerals  Clinical staff conducted group or individual video education with verbal and written material and guidebook.  Patient learns different ways to obtain key vitamins and minerals, including through a recommended healthy diet. It is important to discuss all supplements you take with your doctor.   Healthy Mind-Set    Smoking Cessation  Clinical staff conducted group or individual video education with  verbal and written material and guidebook.  Patient learns that cigarette smoking and tobacco addiction pose a serious health risk which affects millions of people. Stopping smoking will significantly reduce the risk of heart disease, lung disease, and many forms of cancer. Recommended strategies for quitting are covered, including working with your doctor to develop a successful plan.  Culinary   Becoming a Set designer conducted group or individual video education with verbal and written material and guidebook.  Patient learns that cooking at home can be healthy, cost-effective, quick, and puts them in control. Keys to cooking healthy recipes will include looking at your recipe, assessing your equipment needs, planning ahead, making it simple, choosing cost-effective seasonal ingredients, and limiting the use of added fats, salts, and sugars.  Cooking - Breakfast and Snacks  Clinical staff conducted group or individual video education with verbal and written material and guidebook.  Patient learns how important breakfast is to satiety and nutrition through the entire day. Recommendations include key foods to eat during breakfast to help stabilize blood sugar levels and to prevent overeating at meals later in the day. Planning ahead is also a key component.  Cooking - Educational psychologist conducted group or individual video education with verbal and written material and guidebook.  Patient learns eating strategies to improve overall health, including an approach to cook more at home. Recommendations include thinking of animal protein as a  side on your plate rather than center stage and focusing instead on lower calorie dense options like vegetables, fruits, whole grains, and plant-based proteins, such as beans. Making sauces in large quantities to freeze for later and leaving the skin on your vegetables are also recommended to maximize your experience.  Cooking - Healthy  Salads and Dressing Clinical staff conducted group or individual video education with verbal and written material and guidebook.  Patient learns that vegetables, fruits, whole grains, and legumes are the foundations of the Pritikin Eating Plan. Recommendations include how to incorporate each of these in flavorful and healthy salads, and how to create homemade salad dressings. Proper handling of ingredients is also covered. Cooking - Soups and [employer] - Soups and Desserts Clinical staff conducted group or individual video education with verbal and written material and guidebook.  Patient learns that Pritikin soups and desserts make for easy, nutritious, and delicious snacks and meal components that are low in sodium, fat, sugar, and calorie density, while high in vitamins, minerals, and filling fiber. Recommendations include simple and healthy ideas for soups and desserts.   Overview     The Pritikin Solution Program Overview Clinical staff conducted group or individual video education with verbal and written material and guidebook.  Patient learns that the results of the Pritikin Program have been documented in more than 100 articles published in peer-reviewed journals, and the benefits include reducing risk factors for (and, in some cases, even reversing) high cholesterol, high blood pressure, type 2 diabetes, obesity, and more! An overview of the three key pillars of the Pritikin Program will be covered: eating well, doing regular exercise, and having a healthy mind-set.  WORKSHOPS  Exercise: Exercise Basics: Building Your Action Plan Clinical staff led group instruction and group discussion with PowerPoint presentation and patient guidebook. To enhance the learning environment the use of posters, models and videos may be added. At the conclusion of this workshop, patients will comprehend the difference between physical activity and exercise, as well as the benefits of  incorporating both, into their routine. Patients will understand the FITT (Frequency, Intensity, Time, and Type) principle and how to use it to build an exercise action plan. In addition, safety concerns and other considerations for exercise and cardiac rehab will be addressed by the presenter. The purpose of this lesson is to promote a comprehensive and effective weekly exercise routine in order to improve patients' overall level of fitness.   Managing Heart Disease: Your Path to a Healthier Heart Clinical staff led group instruction and group discussion with PowerPoint presentation and patient guidebook. To enhance the learning environment the use of posters, models and videos may be added.At the conclusion of this workshop, patients will understand the anatomy and physiology of the heart. Additionally, they will understand how Pritikin's three pillars impact the risk factors, the progression, and the management of heart disease.  The purpose of this lesson is to provide a high-level overview of the heart, heart disease, and how the Pritikin lifestyle positively impacts risk factors.  Exercise Biomechanics Clinical staff led group instruction and group discussion with PowerPoint presentation and patient guidebook. To enhance the learning environment the use of posters, models and videos may be added. Patients will learn how the structural parts of their bodies function and how these functions impact their daily activities, movement, and exercise. Patients will learn how to promote a neutral spine, learn how to manage pain, and identify ways to improve their physical movement in order to promote  healthy living. The purpose of this lesson is to expose patients to common physical limitations that impact physical activity. Participants will learn practical ways to adapt and manage aches and pains, and to minimize their effect on regular exercise. Patients will learn how to maintain good posture  while sitting, walking, and lifting.  Balance Training and Fall Prevention  Clinical staff led group instruction and group discussion with PowerPoint presentation and patient guidebook. To enhance the learning environment the use of posters, models and videos may be added. At the conclusion of this workshop, patients will understand the importance of their sensorimotor skills (vision, proprioception, and the vestibular system) in maintaining their ability to balance as they age. Patients will apply a variety of balancing exercises that are appropriate for their current level of function. Patients will understand the common causes for poor balance, possible solutions to these problems, and ways to modify their physical environment in order to minimize their fall risk. The purpose of this lesson is to teach patients about the importance of maintaining balance as they age and ways to minimize their risk of falling.  WORKSHOPS   Nutrition:  Fueling a Ship broker led group instruction and group discussion with PowerPoint presentation and patient guidebook. To enhance the learning environment the use of posters, models and videos may be added. Patients will review the foundational principles of the Pritikin Eating Plan and understand what constitutes a serving size in each of the food groups. Patients will also learn Pritikin-friendly foods that are better choices when away from home and review make-ahead meal and snack options. Calorie density will be reviewed and applied to three nutrition priorities: weight maintenance, weight loss, and weight gain. The purpose of this lesson is to reinforce (in a group setting) the key concepts around what patients are recommended to eat and how to apply these guidelines when away from home by planning and selecting Pritikin-friendly options. Patients will understand how calorie density may be adjusted for different weight management goals.  Mindful  Eating  Clinical staff led group instruction and group discussion with PowerPoint presentation and patient guidebook. To enhance the learning environment the use of posters, models and videos may be added. Patients will briefly review the concepts of the Pritikin Eating Plan and the importance of low-calorie dense foods. The concept of mindful eating will be introduced as well as the importance of paying attention to internal hunger signals. Triggers for non-hunger eating and techniques for dealing with triggers will be explored. The purpose of this lesson is to provide patients with the opportunity to review the basic principles of the Pritikin Eating Plan, discuss the value of eating mindfully and how to measure internal cues of hunger and fullness using the Hunger Scale. Patients will also discuss reasons for non-hunger eating and learn strategies to use for controlling emotional eating.  Targeting Your Nutrition Priorities Clinical staff led group instruction and group discussion with PowerPoint presentation and patient guidebook. To enhance the learning environment the use of posters, models and videos may be added. Patients will learn how to determine their genetic susceptibility to disease by reviewing their family history. Patients will gain insight into the importance of diet as part of an overall healthy lifestyle in mitigating the impact of genetics and other environmental insults. The purpose of this lesson is to provide patients with the opportunity to assess their personal nutrition priorities by looking at their family history, their own health history and current risk factors. Patients will also be  able to discuss ways of prioritizing and modifying the Pritikin Eating Plan for their highest risk areas  Menu  Clinical staff led group instruction and group discussion with PowerPoint presentation and patient guidebook. To enhance the learning environment the use of posters, models and videos may  be added. Using menus brought in from [employer], or printed from [employer], patients will apply the Pritikin dining out guidelines that were presented in the [employer] video. Patients will also be able to practice these guidelines in a variety of provided scenarios. The purpose of this lesson is to provide patients with the opportunity to practice hands-on learning of the Pritikin Dining Out guidelines with actual menus and practice scenarios.  Label Reading Clinical staff led group instruction and group discussion with PowerPoint presentation and patient guidebook. To enhance the learning environment the use of posters, models and videos may be added. Patients will review and discuss the Pritikin label reading guidelines presented in Pritikin's Label Reading Educational series video. Using fool labels brought in from local grocery stores and markets, patients will apply the label reading guidelines and determine if the packaged food meet the Pritikin guidelines. The purpose of this lesson is to provide patients with the opportunity to review, discuss, and practice hands-on learning of the Pritikin Label Reading guidelines with actual packaged food labels. Cooking School  Pritikin's [employer] are designed to teach patients ways to prepare quick, simple, and affordable recipes at home. The importance of nutrition's role in chronic disease risk reduction is reflected in its emphasis in the overall Pritikin program. By learning how to prepare essential core Pritikin Eating Plan recipes, patients will increase control over what they eat; be able to customize the flavor of foods without the use of added salt, sugar, or fat; and improve the quality of the food they consume. By learning a set of core recipes which are easily assembled, quickly prepared, and affordable, patients are more likely to prepare more healthy foods at home. These workshops focus on convenient  breakfasts, simple entres, side dishes, and desserts which can be prepared with minimal effort and are consistent with nutrition recommendations for cardiovascular risk reduction. Cooking [employer] are taught by a Armed forces logistics/support/administrative officer (RD) who has been trained by the [employer]. The chef or RD has a clear understanding of the importance of minimizing - if not completely eliminating - added fat, sugar, and sodium in recipes. Throughout the series of Cooking School Workshop sessions, patients will learn about healthy ingredients and efficient methods of cooking to build confidence in their capability to prepare    Cooking School weekly topics:  Adding Flavor- Sodium-Free  Fast and Healthy Breakfasts  Powerhouse Plant-Based Proteins  Satisfying Salads and Dressings  Simple Sides and Sauces  International Cuisine-Spotlight on the [employer] Zones  Delicious Desserts  Savory Soups  [employer] - Meals in a Astronomer Appetizers and Snacks  Comforting Weekend Breakfasts  One-Pot Wonders   Fast Evening Meals  Landscape architect Your Pritikin Plate  WORKSHOPS   Healthy Mindset (Psychosocial):  Focused Goals, Sustainable Changes Clinical staff led group instruction and group discussion with PowerPoint presentation and patient guidebook. To enhance the learning environment the use of posters, models and videos may be added. Patients will be able to apply effective goal setting strategies to establish at least one personal goal, and then take consistent, meaningful action toward that goal. They will learn to identify common barriers to  achieving personal goals and develop strategies to overcome them. Patients will also gain an understanding of how our mind-set can impact our ability to achieve goals and the importance of cultivating a positive and growth-oriented mind-set. The purpose of this lesson is to provide patients with a deeper understanding of  how to set and achieve personal goals, as well as the tools and strategies needed to overcome common obstacles which may arise along the way.  From Head to Heart: The Power of a Healthy Outlook  Clinical staff led group instruction and group discussion with PowerPoint presentation and patient guidebook. To enhance the learning environment the use of posters, models and videos may be added. Patients will be able to recognize and describe the impact of emotions and mood on physical health. They will discover the importance of self-care and explore self-care practices which may work for them. Patients will also learn how to utilize the 4 C's to cultivate a healthier outlook and better manage stress and challenges. The purpose of this lesson is to demonstrate to patients how a healthy outlook is an essential part of maintaining good health, especially as they continue their cardiac rehab journey.  Healthy Sleep for a Healthy Heart Clinical staff led group instruction and group discussion with PowerPoint presentation and patient guidebook. To enhance the learning environment the use of posters, models and videos may be added. At the conclusion of this workshop, patients will be able to demonstrate knowledge of the importance of sleep to overall health, well-being, and quality of life. They will understand the symptoms of, and treatments for, common sleep disorders. Patients will also be able to identify daytime and nighttime behaviors which impact sleep, and they will be able to apply these tools to help manage sleep-related challenges. The purpose of this lesson is to provide patients with a general overview of sleep and outline the importance of quality sleep. Patients will learn about a few of the most common sleep disorders. Patients will also be introduced to the concept of "sleep hygiene," and discover ways to self-manage certain sleeping problems through simple daily behavior changes. Finally, the workshop  will motivate patients by clarifying the links between quality sleep and their goals of heart-healthy living.   Recognizing and Reducing Stress Clinical staff led group instruction and group discussion with PowerPoint presentation and patient guidebook. To enhance the learning environment the use of posters, models and videos may be added. At the conclusion of this workshop, patients will be able to understand the types of stress reactions, differentiate between acute and chronic stress, and recognize the impact that chronic stress has on their health. They will also be able to apply different coping mechanisms, such as reframing negative self-talk. Patients will have the opportunity to practice a variety of stress management techniques, such as deep abdominal breathing, progressive muscle relaxation, and/or guided imagery.  The purpose of this lesson is to educate patients on the role of stress in their lives and to provide healthy techniques for coping with it.  Learning Barriers/Preferences:  Learning Barriers/Preferences - 01/01/23 1015       Learning Barriers/Preferences   Learning Barriers None   Wears reading glasses   Learning Preferences Skilled Demonstration;Video             Education Topics:  Knowledge Questionnaire Score:  Knowledge Questionnaire Score - 01/01/23 1249       Knowledge Questionnaire Score   Pre Score 23/28  Core Components/Risk Factors/Patient Goals at Admission:  Personal Goals and Risk Factors at Admission - 01/01/23 0954       Core Components/Risk Factors/Patient Goals on Admission   Tobacco Cessation Yes    Number of packs per day 0.5    Intervention Assist the participant in steps to quit. Provide individualized education and counseling about committing to Tobacco Cessation, relapse prevention, and pharmacological support that can be provided by physician.;Education officer, environmental, assist with locating and accessing  local/national Quit Smoking programs, and support quit date choice.    Expected Outcomes Short Term: Will demonstrate readiness to quit, by selecting a quit date.;Long Term: Complete abstinence from all tobacco products for at least 12 months from quit date.;Short Term: Will quit all tobacco product use, adhering to prevention of relapse plan.    Hypertension Yes    Intervention Provide education on lifestyle modifcations including regular physical activity/exercise, weight management, moderate sodium restriction and increased consumption of fresh fruit, vegetables, and low fat dairy, alcohol moderation, and smoking cessation.;Monitor prescription use compliance.    Expected Outcomes Short Term: Continued assessment and intervention until BP is < 140/40mm HG in hypertensive participants. < 130/31mm HG in hypertensive participants with diabetes, heart failure or chronic kidney disease.;Long Term: Maintenance of blood pressure at goal levels.    Lipids Yes    Intervention Provide education and support for participant on nutrition & aerobic/resistive exercise along with prescribed medications to achieve LDL 70mg , HDL >40mg .    Expected Outcomes Short Term: Participant states understanding of desired cholesterol values and is compliant with medications prescribed. Participant is following exercise prescription and nutrition guidelines.;Long Term: Cholesterol controlled with medications as prescribed, with individualized exercise RX and with personalized nutrition plan. Value goals: LDL < 70mg , HDL > 40 mg.    Personal Goal Other Yes    Personal Goal Increase motivation to be more active. Enjoy life. Feel good about himself.    Intervention Patient will participate in the intensive cardiac rehab program and attend workshops, video lectures on building an exercise plan and having a healthy mindset.    Expected Outcomes Patient will be more active,compliant with exericse plan and have a better out look as  measured by self-report.             Core Components/Risk Factors/Patient Goals Review:   Goals and Risk Factor Review     Row Name 01/10/23 1547 02/05/23 1513           Core Components/Risk Factors/Patient Goals Review   Personal Goals Review Weight Management/Obesity;Hypertension;Lipids;Stress Weight Management/Obesity;Hypertension;Lipids;Stress      Review Alan White started intensive cardiac rehab on 01/07/23 and is off to a good start to exercise. vital signs stable. Dr Jodie Echevaria notified of low systolic BP on Monday. No new order received. Will continue to monitor BP. Will continue to encourage smoking cessation Alan White has been doing well with exercise at intensive cardiac rehab. Vital signs have been stable. Alan White has lost 1 kg since starting cardiac rehab      Expected Outcomes Alan White will continue to participate in intensive cardiac rehab for exercise, nutrition and lifestyle modification Alan White will continue to participate in intensive cardiac rehab for exercise, nutrition and lifestyle modification               Core Components/Risk Factors/Patient Goals at Discharge (Final Review):   Goals and Risk Factor Review - 02/05/23 1513       Core Components/Risk Factors/Patient Goals Review   Personal Goals Review Weight Management/Obesity;Hypertension;Lipids;Stress  Review Alan White has been doing well with exercise at intensive cardiac rehab. Vital signs have been stable. Alan White has lost 1 kg since starting cardiac rehab    Expected Outcomes Alan White will continue to participate in intensive cardiac rehab for exercise, nutrition and lifestyle modification             ITP Comments:  ITP Comments     Row Name 01/01/23 0919 01/10/23 1540 02/05/23 1508 03/05/23 1212     ITP Comments Medical Director- Dr. Armanda Magic, MD. Oriented patient to the Pritikin Education Program / Intensive Cardiac Rehab. Reviewed intitial orientation folder with patient. 30 Day ITP Review. Alan White started  inteisvie cardiac rehab on 01/07/23 and is off to a good start to exercise 30 Day ITP Review. Alan White has good attendance and participation in  intensive cardiac rehab. 30 Day ITP Review. Alan White continues to have  good attendance and participation in intensive cardiac rehab. Alan White will complete intenisve cardiac rehab on 03/20/23             Comments: See ITP Comments

## 2023-03-06 ENCOUNTER — Other Ambulatory Visit (HOSPITAL_BASED_OUTPATIENT_CLINIC_OR_DEPARTMENT_OTHER): Payer: Self-pay | Admitting: Family Medicine

## 2023-03-06 ENCOUNTER — Other Ambulatory Visit: Payer: Self-pay | Admitting: Family Medicine

## 2023-03-06 ENCOUNTER — Encounter (HOSPITAL_COMMUNITY): Admission: RE | Admit: 2023-03-06 | Payer: No Typology Code available for payment source | Source: Ambulatory Visit

## 2023-03-06 DIAGNOSIS — R1084 Generalized abdominal pain: Secondary | ICD-10-CM | POA: Diagnosis not present

## 2023-03-06 DIAGNOSIS — R197 Diarrhea, unspecified: Secondary | ICD-10-CM | POA: Diagnosis not present

## 2023-03-07 ENCOUNTER — Ambulatory Visit (HOSPITAL_BASED_OUTPATIENT_CLINIC_OR_DEPARTMENT_OTHER)
Admission: RE | Admit: 2023-03-07 | Discharge: 2023-03-07 | Disposition: A | Discharge status: Home / Self Care | Payer: PPO | Source: Ambulatory Visit | Attending: Family Medicine | Admitting: Family Medicine

## 2023-03-07 ENCOUNTER — Other Ambulatory Visit: Payer: No Typology Code available for payment source

## 2023-03-07 DIAGNOSIS — R1084 Generalized abdominal pain: Secondary | ICD-10-CM | POA: Diagnosis not present

## 2023-03-07 LAB — POCT I-STAT CREATININE: Creatinine, Ser: 1.7 mg/dL — ABNORMAL HIGH (ref 0.61–1.24)

## 2023-03-07 MED ORDER — IOHEXOL 350 MG/ML SOLN
100.0000 mL | Freq: Once | INTRAVENOUS | Status: AC | PRN
  Administered 2023-03-07: 60 mL via INTRAVENOUS

## 2023-03-08 ENCOUNTER — Encounter (HOSPITAL_COMMUNITY): Payer: No Typology Code available for payment source

## 2023-03-08 ENCOUNTER — Telehealth (HOSPITAL_COMMUNITY): Payer: Self-pay

## 2023-03-08 NOTE — Progress Notes (Signed)
Discharge Progress Report  Patient Details  Name: Alan White MRN: [id number] Date of Birth: May 13, 1961 Referring Provider:   Flowsheet Row INTENSIVE CARDIAC REHAB ORIENT from 01/01/2023 in [facility]  Referring Provider [name], MD  Armanda Magic, MD covering]        Number of Visits: 29  Reason for Discharge:  Patient reached a stable level of exercise. Patient independent in their exercise. Patient has met program and personal goals.  Smoking History:  Social History   Tobacco Use  Smoking Status Every Day   Packs/day: 0.50   Years: 50.00   Additional pack years: 0.00   Total pack years: 25.00   Types: Cigarettes  Smokeless Tobacco Never    Diagnosis:  Ischemic cardiomyopathy  ADL UCSD:   Initial Exercise Prescription:  Initial Exercise Prescription - 01/01/23 1200       Date of Initial Exercise RX and Referring Provider   Date 01/01/23    Referring Provider [name], MD   Armanda Magic, MD covering   Expected Discharge Date 03/22/23      Treadmill   MPH 1.7    Grade 0    Minutes 15    METs 2.3      NuStep   Level 2    SPM 85    Minutes 15    METs 2.3      Prescription Details   Frequency (times per week) 3    Duration Progress to 30 minutes of continuous aerobic without signs/symptoms of physical distress      Intensity   THRR 40-80% of Max Heartrate 63-126    Ratings of Perceived Exertion 11-13    Perceived Dyspnea 0-4      Progression   Progression Continue to progress workloads to maintain intensity without signs/symptoms of physical distress.      Resistance Training   Training Prescription Yes    Weight 4 lbs    Reps 10-15             Discharge Exercise Prescription (Final Exercise Prescription Changes):  Exercise Prescription Changes - 03/04/23 1008       Response to Exercise   Blood Pressure (Admit) 100/72    Blood Pressure (Exercise) 118/60     Blood Pressure (Exit) 94/62    Heart Rate (Admit) 85 bpm    Heart Rate (Exercise) 120 bpm    Heart Rate (Exit) 96 bpm    Rating of Perceived Exertion (Exercise) 12    Symptoms None    Comments Reviewed METs with patient.    Duration Continue with 30 min of aerobic exercise without signs/symptoms of physical distress.    Intensity THRR unchanged      Progression   Progression Continue to progress workloads to maintain intensity without signs/symptoms of physical distress.    Average METs 4.1      Resistance Training   Training Prescription Yes    Weight 5 lbs    Reps 10-15    Time 10 Minutes      Interval Training   Interval Training No      Treadmill   MPH 3    Grade 1.5    Minutes 15    METs 3.92      NuStep   Level 4    SPM 108    Minutes 15    METs 4.2      Home Exercise Plan   Plans to continue exercise at  Home (comment)   Treadmill, walking   Frequency Add 2 additional days to program exercise sessions.    Initial Home Exercises Provided 01/28/23             Functional Capacity:  6 Minute Walk     Row Name 01/01/23 1045         6 Minute Walk   Phase Initial     Distance 1444 feet     Walk Time 6 minutes     # of Rest Breaks 0     MPH 2.73     METS 3.28     RPE 9     Perceived Dyspnea  0.5     VO2 Peak 11.47     Symptoms Yes (comment)     Comments Mild shortness of breath.     Resting HR 68 bpm     Resting BP 118/76     Resting Oxygen Saturation  97 %     Exercise Oxygen Saturation  during 6 min walk 98 %     Max Ex. HR 86 bpm     Max Ex. BP 110/72     2 Minute Post BP 118/66              Psychological, QOL, Others - Outcomes: PHQ 2/9:    01/01/2023   12:54 PM 03/22/2014    9:10 AM  Depression screen PHQ 2/9  Decreased Interest 2 0  Down, Depressed, Hopeless 2 0  PHQ - 2 Score 4 0  Altered sleeping 2   Tired, decreased energy 2   Change in appetite 2   Feeling bad or failure about yourself  2   Trouble concentrating 0    Moving slowly or fidgety/restless 0   Suicidal thoughts 0   PHQ-9 Score 12   Difficult doing work/chores Somewhat difficult     Quality of Life:  Quality of Life - 01/01/23 1247       Quality of Life   Select Quality of Life      Quality of Life Scores   Health/Function Pre 18.6 %    Socioeconomic Pre 23.79 %    Psych/Spiritual Pre 21.43 %    Family Pre 22.5 %    GLOBAL Pre 20.82 %             Personal Goals: Goals established at orientation with interventions provided to work toward goal.  Personal Goals and Risk Factors at Admission - 01/01/23 0954       Core Components/Risk Factors/Patient Goals on Admission   Tobacco Cessation Yes    Number of packs per day 0.5    Intervention Assist the participant in steps to quit. Provide individualized education and counseling about committing to Tobacco Cessation, relapse prevention, and pharmacological support that can be provided by physician.;Education officer, environmental, assist with locating and accessing local/national Quit Smoking programs, and support quit date choice.    Expected Outcomes Short Term: Will demonstrate readiness to quit, by selecting a quit date.;Long Term: Complete abstinence from all tobacco products for at least 12 months from quit date.;Short Term: Will quit all tobacco product use, adhering to prevention of relapse plan.    Hypertension Yes    Intervention Provide education on lifestyle modifcations including regular physical activity/exercise, weight management, moderate sodium restriction and increased consumption of fresh fruit, vegetables, and low fat dairy, alcohol moderation, and smoking cessation.;Monitor prescription use compliance.    Expected Outcomes Short Term: Continued assessment and intervention until BP is <  140/22mm HG in hypertensive participants. < 130/88mm HG in hypertensive participants with diabetes, heart failure or chronic kidney disease.;Long Term: Maintenance of blood pressure at  goal levels.    Lipids Yes    Intervention Provide education and support for participant on nutrition & aerobic/resistive exercise along with prescribed medications to achieve LDL 70mg , HDL >40mg .    Expected Outcomes Short Term: Participant states understanding of desired cholesterol values and is compliant with medications prescribed. Participant is following exercise prescription and nutrition guidelines.;Long Term: Cholesterol controlled with medications as prescribed, with individualized exercise RX and with personalized nutrition plan. Value goals: LDL < 70mg , HDL > 40 mg.    Personal Goal Other Yes    Personal Goal Increase motivation to be more active. Enjoy life. Feel good about himself.    Intervention Patient will participate in the intensive cardiac rehab program and attend workshops, video lectures on building an exercise plan and having a healthy mindset.    Expected Outcomes Patient will be more active,compliant with exericse plan and have a better out look as measured by self-report.              Personal Goals Discharge:  Goals and Risk Factor Review     Row Name 01/10/23 1547 02/05/23 1513           Core Components/Risk Factors/Patient Goals Review   Personal Goals Review Weight Management/Obesity;Hypertension;Lipids;Stress Weight Management/Obesity;Hypertension;Lipids;Stress      Review Tresa Endo started intensive cardiac rehab on 01/07/23 and is off to a good start to exercise. vital signs stable. Dr Jodie Echevaria notified of low systolic BP on Monday. No new order received. Will continue to monitor BP. Will continue to encourage smoking cessation Tresa Endo has been doing well with exercise at intensive cardiac rehab. Vital signs have been stable. Tresa Endo has lost 1 kg since starting cardiac rehab      Expected Outcomes Tresa Endo will continue to participate in intensive cardiac rehab for exercise, nutrition and lifestyle modification Tresa Endo will continue to participate in intensive cardiac  rehab for exercise, nutrition and lifestyle modification               Exercise Goals and Review:  Exercise Goals     Row Name 01/01/23 0946             Exercise Goals   Increase Physical Activity Yes       Intervention Provide advice, education, support and counseling about physical activity/exercise needs.;Develop an individualized exercise prescription for aerobic and resistive training based on initial evaluation findings, risk stratification, comorbidities and participant's personal goals.       Expected Outcomes Short Term: Attend rehab on a regular basis to increase amount of physical activity.;Long Term: Add in home exercise to make exercise part of routine and to increase amount of physical activity.;Long Term: Exercising regularly at least 3-5 days a week.       Increase Strength and Stamina Yes       Intervention Provide advice, education, support and counseling about physical activity/exercise needs.;Develop an individualized exercise prescription for aerobic and resistive training based on initial evaluation findings, risk stratification, comorbidities and participant's personal goals.       Expected Outcomes Short Term: Increase workloads from initial exercise prescription for resistance, speed, and METs.;Short Term: Perform resistance training exercises routinely during rehab and add in resistance training at home;Long Term: Improve cardiorespiratory fitness, muscular endurance and strength as measured by increased METs and functional capacity ( )       Able to  understand and use rate of perceived exertion (RPE) scale Yes       Intervention Provide education and explanation on how to use RPE scale       Expected Outcomes Short Term: Able to use RPE daily in rehab to express subjective intensity level;Long Term:  Able to use RPE to guide intensity level when exercising independently       Knowledge and understanding of Target Heart Rate Range (THRR) Yes       Intervention  Provide education and explanation of THRR including how the numbers were predicted and where they are located for reference       Expected Outcomes Short Term: Able to state/look up THRR;Long Term: Able to use THRR to govern intensity when exercising independently;Short Term: Able to use daily as guideline for intensity in rehab       Able to check pulse independently Yes       Intervention Provide education and demonstration on how to check pulse in carotid and radial arteries.;Review the importance of being able to check your own pulse for safety during independent exercise       Expected Outcomes Short Term: Able to explain why pulse checking is important during independent exercise;Long Term: Able to check pulse independently and accurately       Understanding of Exercise Prescription Yes       Intervention Provide education, explanation, and written materials on patient's individual exercise prescription       Expected Outcomes Short Term: Able to explain program exercise prescription;Long Term: Able to explain home exercise prescription to exercise independently                Exercise Goals Re-Evaluation:  Exercise Goals Re-Evaluation     Row Name 01/07/23 1132 01/28/23 1046 02/13/23 1124 02/22/23 1040 03/08/23 0846     Exercise Goal Re-Evaluation   Exercise Goals Review Increase Physical Activity;Increase Strength and Stamina;Able to understand and use rate of perceived exertion (RPE) scale Increase Physical Activity;Increase Strength and Stamina;Able to understand and use rate of perceived exertion (RPE) scale;Knowledge and understanding of Target Heart Rate Range (THRR);Understanding of Exercise Prescription Increase Physical Activity;Increase Strength and Stamina;Able to understand and use rate of perceived exertion (RPE) scale;Knowledge and understanding of Target Heart Rate Range (THRR);Understanding of Exercise Prescription Increase Physical Activity;Increase Strength and  Stamina;Able to understand and use rate of perceived exertion (RPE) scale;Knowledge and understanding of Target Heart Rate Range (THRR);Understanding of Exercise Prescription Increase Physical Activity;Increase Strength and Stamina;Able to understand and use rate of perceived exertion (RPE) scale;Knowledge and understanding of Target Heart Rate Range (THRR);Understanding of Exercise Prescription   Comments Patient able to understand and use RPE scale appropriately. Patient states he's more motivated, has more energy and is eating healhtier. Patient started using his treadmill yesterday for 15 minutes at 3.0/0.0 and tolerated well. Patient's nicotine intake has decreased from 10 cigarettes to 8 cigarettes/day. Patient is still SOB going up hills. Will add incline to TM prescription at cardiac rehab to help decrease SOB with inclines. Reviewed exercise prescription. Patient continues to progress well and is pleased with his progress. He is tolerating incline on the treadmill fine without problem. He would like to increase incline on the TM next week. He feels that his legs are stronger, and he has less shortness of breath. Patient is tolerating the increased speed and incline on the treadmill well and plans to increase incline next week from 1.5% to 2.0%. Patient still has some shortness of breath but  feels it's less since medication change from Carvedilol to Metoprolol. He states he feels more motivated and feels better overall. Tresa Endo called today needing to discharge from the program due to gall bladder issues and need for gall bladder removal. He was very thankful for the help he received in the cardiac rehab program.   Expected Outcomes Progress workloads as tolerated to help improve cardiorespiratory fitness. Patient will exercise at least 30 minutes 2 days/week at home. Will add incline to TM at CR. Will increase incline on the TM to continue to increase strength and stamina. Continue to progress workloads as  tolerated. Tresa Endo will continue exercise independently at this time.            Nutrition & Weight - Outcomes:  Pre Biometrics - 01/01/23 0919       Pre Biometrics   Waist Circumference 44.25 inches    Hip Circumference 43.75 inches    Waist to Hip Ratio 1.01 %    Triceps Skinfold 22 mm    % Body Fat 30.9 %    Grip Strength 40 kg    Flexibility 10.88 in    Single Leg Stand 13.93 seconds              Nutrition:  Nutrition Therapy & Goals - 03/04/23 1132       Nutrition Therapy   Diet Heart Healthy Diet    Drug/Food Interactions Statins/Certain Fruits      Personal Nutrition Goals   Nutrition Goal Patient to identify strategies for reducing cardiovascular risk by attending the [employer] education and nutrition series weekly.    Personal Goal #2 Patient to improve diet quality by using the plate method as a guide for meal planning to include lean protein/plant protein, fruits, vegetables, whole grains, nonfat dairy as part of a well-balanced diet.    Personal Goal #3 Patient to reduce sodium intake to 1500mg  per day    Personal Goal #4 Patient to identify food sources and limit daily intake of saturated fat, trans fat, sodium, and refined carbohydrates.    Comments Goals in action. Tresa Endo has not attended the [employer] and nutrition series regularly over the last month/30 days. He has started making many dietary changes including reduced mountain dew from 5/day to 3/day, drastically reduced fried foods and decreased portions of meat, reduced snacking on refined carbohydrates (chips) and switched to fruit, and increased non-starchy vegetables at dinner daily. Tresa Endo remains motivated to continue to improve food choices. He is down 4.2# since starting with our program. [facility]. Tresa Endo will benefit from participation in intensive cardiac rehab for nutrition, exercise, and lifestyle modification.      Intervention Plan    Intervention Prescribe, educate and counsel regarding individualized specific dietary modifications aiming towards targeted core components such as weight, hypertension, lipid management, diabetes, heart failure and other comorbidities.;Nutrition handout(s) given to patient.    Expected Outcomes Short Term Goal: Understand basic principles of dietary content, such as calories, fat, sodium, cholesterol and nutrients.;Long Term Goal: Adherence to prescribed nutrition plan.             Nutrition Discharge:  Nutrition Assessments - 03/08/23 0950       Rate Your Plate Scores   Pre Score 40             Education Questionnaire Score:  Knowledge Questionnaire Score - 01/01/23 1249       Knowledge Questionnaire Score   Pre Score 23/28  Kelly completed   20 exercise and 9 education sessions during his participation in Intensive Cardiac Rehab. Pt maintained good attendance and progressed nicely during their participation in rehab as evidenced by increased MET level .Kelly's last day of participation was on 03/04/23. Tresa Endo did not get to return to cardiac rehab to complete his post exercise 6 minute  due to a gall bladder issues. Pt has made significant lifestyle changes and should be commended for their success. Tresa Endo said that he made progress and enjoyed participating in the program. Thayer Headings RN BSN

## 2023-03-08 NOTE — Telephone Encounter (Signed)
Pt called and stated he will no longer be able to attend CR due to a gallbladder attack.

## 2023-03-11 ENCOUNTER — Encounter (HOSPITAL_COMMUNITY): Payer: No Typology Code available for payment source

## 2023-03-13 ENCOUNTER — Encounter (HOSPITAL_COMMUNITY): Payer: No Typology Code available for payment source

## 2023-03-15 ENCOUNTER — Encounter (HOSPITAL_COMMUNITY): Payer: No Typology Code available for payment source

## 2023-03-18 ENCOUNTER — Encounter (HOSPITAL_COMMUNITY): Payer: No Typology Code available for payment source

## 2023-03-20 ENCOUNTER — Encounter (HOSPITAL_COMMUNITY): Payer: No Typology Code available for payment source

## 2023-04-05 ENCOUNTER — Telehealth: Payer: Self-pay | Admitting: *Deleted

## 2023-04-05 DIAGNOSIS — K8010 Calculus of gallbladder with chronic cholecystitis without obstruction: Secondary | ICD-10-CM | POA: Diagnosis not present

## 2023-04-05 DIAGNOSIS — I255 Ischemic cardiomyopathy: Secondary | ICD-10-CM | POA: Diagnosis not present

## 2023-04-05 NOTE — Telephone Encounter (Signed)
   Name: Gene Weatherby  DOB: 06/20/1961  MRN: [id number]  Primary Cardiologist: Chrystie Nose, MD   Preoperative team, please contact this patient and set up a phone call appointment for further preoperative risk assessment. Please obtain consent and complete medication review. Thank you for your help.  I confirm that guidance regarding antiplatelet and oral anticoagulation therapy has been completed and, if necessary, noted below.  Per Dr. Rennis Golden: "Ok to hold Plavix 5 days prior to surgery- would like to continue low dose aspirin if possible perioperatively due to history of coronary thrombosis and prior stenting. Resume Plavix post-op as soon as safe from a bleeding standpoint."     Carlos Levering, NP 04/05/2023, 3:39 PM [facility]

## 2023-04-05 NOTE — Telephone Encounter (Signed)
   Pre-operative Risk Assessment    Patient Name: Alan White  DOB: 1960-12-20 MRN: [id number]      Request for Surgical Clearance    Procedure:   GALLBLADDER SURGERY  Date of Surgery:  Clearance TBD                                 Surgeon:  DR. Feliciana Rossetti Surgeon's Group or Practice Name:  [facility] Phone number:  [phone] Fax number:  [phone] ATTN: Santiago Glad, CAM   Type of Clearance Requested:   - Medical  - Pharmacy:  Hold Aspirin and Clopidogrel (Plavix)     Type of Anesthesia:  General    Additional requests/questions:    [name]   04/05/2023, 1:30 PM

## 2023-04-05 NOTE — Telephone Encounter (Signed)
Pt has been scheduled for tele pre op appt 04/11/23 @ 9:40. Med rec and consent are done.

## 2023-04-05 NOTE — Telephone Encounter (Signed)
Ok to hold Plavix 5 days prior to surgery- would like to continue low dose aspirin if possible perioperatively due to history of coronary thrombosis and prior stenting. Resume Plavix post-op as soon as safe from a bleeding standpoint.  Dr. Rennis Golden

## 2023-04-05 NOTE — Telephone Encounter (Signed)
Dr. Rennis Golden,   This patient is pending gallbladder surgery. He has a history of stents with last PCI May 2020. The catheterization showed a large thrombus burden and lifelong DAPT was recommended. Will you please provide recommendations for patient holding Plavix and aspirin for his upcoming surgery.   Please route your response to P CV DIV Preop. I will communicate with requesting office once you have given recommendations.   Thank you!  Carlos Levering, NP

## 2023-04-05 NOTE — Telephone Encounter (Signed)
Pt has been scheduled for tele pre op appt 04/11/23 @ 9:40. Med rec and consent are done.     Patient Consent for Virtual Visit        Alan White has provided verbal consent on 04/05/2023 for a virtual visit (video or telephone).   CONSENT FOR VIRTUAL VISIT FOR:  Alan White  By participating in this virtual visit I agree to the following:  I hereby voluntarily request, consent and authorize [facility] and its employed or contracted physicians, physician assistants, nurse practitioners or other licensed health care professionals (the Practitioner), to provide me with telemedicine health care services (the "Services") as deemed necessary by the treating Practitioner. I acknowledge and consent to receive the Services by the Practitioner via telemedicine. I understand that the telemedicine visit will involve communicating with the Practitioner through live audiovisual communication technology and the disclosure of certain medical information by electronic transmission. I acknowledge that I have been given the opportunity to request an in-person assessment or other available alternative prior to the telemedicine visit and am voluntarily participating in the telemedicine visit.  I understand that I have the right to withhold or withdraw my consent to the use of telemedicine in the course of my care at any time, without affecting my right to future care or treatment, and that the Practitioner or I may terminate the telemedicine visit at any time. I understand that I have the right to inspect all information obtained and/or recorded in the course of the telemedicine visit and may receive copies of available information for a reasonable fee.  I understand that some of the potential risks of receiving the Services via telemedicine include:  Delay or interruption in medical evaluation due to technological equipment failure or disruption; Information transmitted may not be sufficient (e.g. poor  resolution of images) to allow for appropriate medical decision making by the Practitioner; and/or  In rare instances, security protocols could fail, causing a breach of personal health information.  Furthermore, I acknowledge that it is my responsibility to provide information about my medical history, conditions and care that is complete and accurate to the best of my ability. I acknowledge that Practitioner's advice, recommendations, and/or decision may be based on factors not within their control, such as incomplete or inaccurate data provided by me or distortions of diagnostic images or specimens that may result from electronic transmissions. I understand that the practice of medicine is not an exact science and that Practitioner makes no warranties or guarantees regarding treatment outcomes. I acknowledge that a copy of this consent can be made available to me via my patient portal [facility] MyChart), or I can request a printed copy by calling the office of [facility].    I understand that my insurance will be billed for this visit.   I have read or had this consent read to me. I understand the contents of this consent, which adequately explains the benefits and risks of the Services being provided via telemedicine.  I have been provided ample opportunity to ask questions regarding this consent and the Services and have had my questions answered to my satisfaction. I give my informed consent for the services to be provided through the use of telemedicine in my medical care

## 2023-04-11 ENCOUNTER — Ambulatory Visit: Payer: PPO | Attending: Cardiovascular Disease

## 2023-04-11 DIAGNOSIS — I255 Ischemic cardiomyopathy: Secondary | ICD-10-CM | POA: Diagnosis not present

## 2023-04-11 DIAGNOSIS — E782 Mixed hyperlipidemia: Secondary | ICD-10-CM | POA: Diagnosis not present

## 2023-04-11 DIAGNOSIS — I2510 Atherosclerotic heart disease of native coronary artery without angina pectoris: Secondary | ICD-10-CM | POA: Diagnosis not present

## 2023-04-11 DIAGNOSIS — Z9861 Coronary angioplasty status: Secondary | ICD-10-CM

## 2023-04-11 DIAGNOSIS — Z01810 Encounter for preprocedural cardiovascular examination: Secondary | ICD-10-CM

## 2023-04-11 MED ORDER — METOPROLOL SUCCINATE ER 25 MG PO TB24
12.5000 mg | ORAL_TABLET | Freq: Every day | ORAL | 3 refills | Status: DC
Start: 2023-04-11 — End: 2024-08-05

## 2023-04-11 MED ORDER — ENTRESTO 24-26 MG PO TABS
0.5000 | ORAL_TABLET | Freq: Two times a day (BID) | ORAL | 3 refills | Status: DC
Start: 2023-04-11 — End: 2023-08-05

## 2023-04-11 MED ORDER — ASPIRIN 81 MG PO TBEC
81.0000 mg | DELAYED_RELEASE_TABLET | Freq: Every day | ORAL | 11 refills | Status: AC
Start: 2023-04-11 — End: ?

## 2023-04-11 MED ORDER — SPIRONOLACTONE 25 MG PO TABS
12.5000 mg | ORAL_TABLET | Freq: Every day | ORAL | 4 refills | Status: DC
Start: 2023-04-11 — End: 2023-08-05

## 2023-04-11 MED ORDER — ISOSORBIDE MONONITRATE ER 30 MG PO TB24: 30.0000 mg | ORAL_TABLET | Freq: Every day | ORAL | 3 refills | Status: DC

## 2023-04-11 MED ORDER — ATORVASTATIN CALCIUM 80 MG PO TABS: 80.0000 mg | ORAL_TABLET | Freq: Every day | ORAL | 2 refills | Status: DC

## 2023-04-11 MED ORDER — CLOPIDOGREL BISULFATE 75 MG PO TABS: 75.0000 mg | ORAL_TABLET | Freq: Every day | ORAL | 0 refills | Status: DC

## 2023-04-11 NOTE — Progress Notes (Signed)
Virtual Visit via Telephone Note   Because of Xadrian Craighead co-morbid illnesses, he is at least at moderate risk for complications without adequate follow up.  This format is felt to be most appropriate for this patient at this time.  The patient did not have access to video technology/had technical difficulties with video requiring transitioning to audio format only (telephone).  All issues noted in this document were discussed and addressed.  No physical exam could be performed with this format.  Please refer to the patient's chart for his consent to telehealth for [facility].  Evaluation Performed:  Preoperative cardiovascular risk assessment _____________   Date:  04/11/2023   Patient ID:  Alan White, DOB [birth year], MRN [id number] Patient Location:  Home Provider location:   Office  Primary Care Provider:  Joycelyn Rua, MD Primary Cardiologist:  Chrystie Nose, MD  Chief Complaint / Patient Profile   62 y.o. y/o male with a h/o HFrEF s/p ICD, baroreflex 2021, CAD with DES to LCX and LAD (2015), angioplasty to LAD 2020, HLD, OSA, and tobacco use who is pending GALLBLADDER SURGERY and presents today for telephonic preoperative cardiovascular risk assessment.  History of Present Illness    Alan White is a 62 y.o. male who presents via audio/video conferencing for a telehealth visit today.  Pt was last seen in cardiology clinic on 01/30/23 by Juanda Crumble PAC.  At that time Alan White was doing well.  The patient is now pending procedure as outlined above. Since his last visit, he remains active and reports activity equivalent to greater than 4.0 METS without angina.   Past Medical History    Past Medical History:  Diagnosis Date   AICD (automatic cardioverter/defibrillator) present    [employer]) [employer] ICD   CAD (coronary artery disease), 12/29/13 PCI/DES LCX and PCI/DES LAD with overlapping DES  12/30/2013   cath 07/05/14 OK   CHF (congestive  heart failure) (HCC)    Dyslipidemia, goal LDL below 70 12/30/2013   GERD (gastroesophageal reflux disease)    Metabolic syndrome, HgbA1C 6.0  12/30/2013   Myocardial infarction (HCC) 2015,2020   Sleep apnea    by history - patient denies this dx as of 08/01/20   Smoker    Wears partial dentures    upper   Past Surgical History:  Procedure Laterality Date   BAROREFLEX SYSTEM INSERTION Right 08/02/2020   Procedure: BAROREFLEX SYSTEM INSERTION;  Surgeon: Hillis Range, MD;  Location: [facility] INVASIVE CV LAB;  Service: Cardiovascular;  Laterality: Right;   CARDIAC CATHETERIZATION  07/05/14,01/31/19   patent stents   CORONARY ANGIOPLASTY WITH STENT PLACEMENT  12/29/13   DES to LCX and overlapping stents DES mid LAD   CORONARY/GRAFT ACUTE MI REVASCULARIZATION N/A 01/31/2019   Procedure: Coronary/Graft Acute MI Revascularization;  Surgeon: Kathleene Hazel, MD;  Location: [facility] INVASIVE CV LAB;  Service: Cardiovascular;  Laterality: N/A;   ICD IMPLANT N/A 12/17/2019   Procedure: ICD IMPLANT;  Surgeon: Regan Lemming, MD;  Location: [facility] INVASIVE CV LAB;  Service: Cardiovascular;  Laterality: N/A;   LEFT HEART CATH AND CORONARY ANGIOGRAPHY N/A 01/31/2019   Procedure: LEFT HEART CATH AND CORONARY ANGIOGRAPHY;  Surgeon: Kathleene Hazel, MD;  Location: [facility] INVASIVE CV LAB;  Service: Cardiovascular;  Laterality: N/A;   LEFT HEART CATHETERIZATION WITH CORONARY ANGIOGRAM N/A 12/29/2013   Procedure: LEFT HEART CATHETERIZATION WITH CORONARY ANGIOGRAM;  Surgeon: [name], MD;  Location[facility] CATH LAB;  Service: Cardiovascular;  Laterality: N/A;  LEFT HEART CATHETERIZATION WITH CORONARY ANGIOGRAM N/A 07/05/2014   Procedure: LEFT HEART CATHETERIZATION WITH CORONARY ANGIOGRAM;  Surgeon: Peter M Swaziland, MD;  Location: [facility] CATH LAB;  Service: Cardiovascular;  Laterality: N/A;   PERCUTANEOUS CORONARY STENT INTERVENTION (PCI-S)  12/29/2013   Procedure: PERCUTANEOUS CORONARY STENT INTERVENTION (PCI-S);   Surgeon: [name], MD;  Location: [facility] CATH LAB;  Service: Cardiovascular;;    Allergies  Allergies  Allergen Reactions   Zetia [Ezetimibe] Shortness Of Breath    Throat swelling    Home Medications    Prior to Admission medications   Medication Sig Start Date End Date Taking? Authorizing Provider  aspirin EC 81 MG EC tablet Take 1 tablet (81 mg total) by mouth daily. 02/03/19   Bhagat, Sharrell Ku, PA  atorvastatin (LIPITOR) 80 MG tablet TAKE 1 TABLET BY MOUTH EVERY DAY 06/01/21   Runell Gess, MD  clopidogrel (PLAVIX) 75 MG tablet TAKE 1 TABLET BY MOUTH EVERY DAY 11/27/21   Camnitz, Will Daphine Deutscher, MD  ENTRESTO 24-26 MG TAKE 1 TABLET BY MOUTH TWICE A DAY Patient taking differently: 0.5 tablets 2 (two) times daily. 06/06/21   Hilty, Lisette Abu, MD  isosorbide mononitrate (IMDUR) 30 MG 24 hr tablet 1/2 tablet in the morning 02/02/19   [provider]  metoprolol succinate (TOPROL XL) 25 MG 24 hr tablet Take 0.5 tablets (12.5 mg total) by mouth daily. 01/30/23   Cannon Kettle, PA-C  nitroGLYCERIN (NITROSTAT) 0.4 MG SL tablet Place 1 tablet (0.4 mg total) under the tongue every 5 (five) minutes x 3 doses as needed for chest pain. 09/06/20   Ronney Asters, NP  pantoprazole (PROTONIX) 40 MG tablet TAKE 1 TABLET BY MOUTH EVERY DAY 09/08/21   Hilty, Lisette Abu, MD  spironolactone (ALDACTONE) 25 MG tablet TAKE 1/2 TABLET BY MOUTH EVERY DAY 09/01/21   Chrystie Nose, MD    Physical Exam    Vital Signs:  Alan White does not have vital signs available for review today.  Given telephonic nature of communication, physical exam is limited. AAOx3. NAD. Normal affect.  Speech and respirations are unlabored.  Accessory Clinical Findings    None  Assessment & Plan    1.  Preoperative Cardiovascular Risk Assessment:  According to the RCRI has a 6.6% Mace.  He can complete more than 4.0 METS without angina.  He does not have any unstable cardiac conditions at this time.  He  understands his risk and wishes to proceed.  The patient was advised that if he develops new symptoms prior to surgery to contact our office to arrange for a follow-up visit, and he verbalized understanding.   Ok to hold Plavix 5 days prior to surgery- would like to continue low dose aspirin if possible perioperatively due to history of coronary thrombosis and prior stenting. Resume Plavix post-op as soon as safe from a bleeding standpoint.   Therefore according to [facility] guidelines, he may proceed with surgery without further testing.  Of note, he wished to have his prescriptions refilled through [place] versus the [place].  I went over his medications and dosages.  He continues to take a half a tablet of low-dose Entresto twice daily.  However he was taking a whole tablet of spironolactone.  As he was previously found to have borderline blood pressure, I recommended taking 12.5 mg spironolactone.  He will let us know if he has any issues with these.   A copy of this note will be routed to requesting surgeon.  Time:   Today, I have spent 15 minutes with the patient with telehealth technology discussing medical history, symptoms, and management plan.     Roe Rutherford Macee Venables, PA  04/11/2023, 10:06 AM

## 2023-05-20 ENCOUNTER — Ambulatory Visit: Payer: Self-pay | Admitting: General Surgery

## 2023-05-21 DIAGNOSIS — J209 Acute bronchitis, unspecified: Secondary | ICD-10-CM | POA: Diagnosis not present

## 2023-05-21 DIAGNOSIS — J441 Chronic obstructive pulmonary disease with (acute) exacerbation: Secondary | ICD-10-CM | POA: Diagnosis not present

## 2023-06-06 NOTE — Pre-Procedure Instructions (Addendum)
Alan White  06/06/2023   Your procedure is scheduled on Friday, September 13th..  Report to [facility] Main Entrance "A" at 5:30 A.M., then check in with the Admitting office.  Call [phone] if you have problems or questions between now and the morning of surgery:  Questions prior to your surgery date: call [phone], Monday-Friday, 8am-4pm. If you experience any cold or flu symptoms such as cough, fever, chills, shortness of breath, etc. between now and your scheduled surgery, please notify us at the above number.   Remember: Do not eat after midnight the night before your surgery  You may drink clear liquids until 4:30 AM  the morning of your surgery.   Clear liquids allowed are: Water, Non-Citrus Juices (without pulp), Carbonated Beverages, Clear Tea, Black Coffee Only (NO MILK, CREAM, or POWDERED CREAMER of any kind), and Gatorade   Take these medicines the morning of surgery with A SIP OF WATER:  atorvastatin (LIPITOR)  isosorbide mononitrate (IMDUR)   If needed you may take these medications the morning of surgery:  nitroGLYCERIN (NITROSTAT) - call EMS  As of today, STOP taking any Aspirin (unless otherwise instructed by your surgeon) or Aspirin-containing products; NSAIDS - Aleve, Naproxen, Ibuprofen, Motrin, Advil, Goody's, BC's, all herbal medications, fish oil, and all vitamins.    Pre-operative CHG Bathing Instructions               TAKE A SHOWER THE NIGHT BEFORE SURGERY AND THE DAY OF SURGERY    Please keep in mind the following:  DO NOT shave, including legs and underarms, 48 hours prior to surgery.   You may shave your face before/day of surgery.  Place clean sheets on your bed the night before surgery Use a clean washcloth (not used since being washed) for each shower. DO NOT sleep with pet's night before surgery.  CHG Shower Instructions:  If you choose to wash your hair and private area, wash first with your normal shampoo/soap.  After you use  shampoo/soap, rinse your hair and body thoroughly to remove shampoo/soap residue.  Turn the water OFF and apply half the bottle of CHG soap to a CLEAN washcloth.  Apply CHG soap ONLY FROM YOUR NECK DOWN TO YOUR TOES (washing for 3-5 minutes)  DO NOT use CHG soap on face, private areas, open wounds, or sores.  Pay special attention to the area where your surgery is being performed.  If you are having back surgery, having someone wash your back for you may be helpful. Wait 2 minutes after CHG soap is applied, then you may rinse off the CHG soap.  Pat dry with a clean towel  Put on clean pajamas    Additional instructions for the day of surgery: Shower as instructed above.  DO NOT APPLY any lotions, deodorants, cologne, or perfumes.   Do not wear jewelry or makeup Do not wear nail polish, gel polish, artificial nails, or any other type of covering on natural nails (fingers and toes) Do not bring valuables to the hospital. [facility] is not responsible for valuables/personal belongings. Put on clean/comfortable clothes.  Please brush your teeth.  Ask your nurse before applying any prescription medications to the skin.     Do not bring valuables to the hospital - [facility] is not responsible for any belongings or valuables.  Do NOT Smoke (Tobacco/Vaping) or drink Alcohol 24 hours prior to your procedure  If you use a CPAP at night, please bring your mask for your overnight  stay.   Contacts, glasses, hearing aids, dentures or partials may not be worn into surgery, please bring cases for these belongings   For patients admitted to the hospital, discharge time will be determined by your treatment team.   Patients discharged the day of surgery will not be allowed to drive home, and someone needs to stay with them for 24 hours.  NO VISITORS WILL BE ALLOWED IN PRE-OP WHERE PATIENTS ARE PREPPED FOR SURGERY.  ONLY 1 SUPPORT PERSON MAY BE PRESENT IN THE WAITING ROOM WHILE YOU ARE IN SURGERY.   IF YOU ARE TO BE ADMITTED, ONCE YOU ARE IN YOUR ROOM YOU WILL BE ALLOWED TWO (2) VISITORS. 1 (ONE) VISITOR MAY STAY OVERNIGHT BUT MUST ARRIVE TO THE ROOM BY 8pm.  Minor children may have two parents present. Special consideration for safety and communication needs will be reviewed on a case by case basis.

## 2023-06-07 ENCOUNTER — Other Ambulatory Visit: Payer: Self-pay

## 2023-06-07 ENCOUNTER — Encounter (HOSPITAL_COMMUNITY)
Admission: RE | Admit: 2023-06-07 | Discharge: 2023-06-07 | Disposition: A | Discharge status: Home / Self Care | Payer: PPO | Source: Ambulatory Visit | Attending: General Surgery | Admitting: General Surgery

## 2023-06-07 ENCOUNTER — Encounter (HOSPITAL_COMMUNITY): Payer: Self-pay

## 2023-06-07 ENCOUNTER — Encounter: Payer: Self-pay | Admitting: Cardiology

## 2023-06-07 VITALS — BP 101/68 | HR 77 | Temp 97.9°F | Resp 18 | Ht 72.0 in | Wt 206.2 lb

## 2023-06-07 DIAGNOSIS — Z7902 Long term (current) use of antithrombotics/antiplatelets: Secondary | ICD-10-CM | POA: Insufficient documentation

## 2023-06-07 DIAGNOSIS — I2510 Atherosclerotic heart disease of native coronary artery without angina pectoris: Secondary | ICD-10-CM | POA: Insufficient documentation

## 2023-06-07 DIAGNOSIS — Z955 Presence of coronary angioplasty implant and graft: Secondary | ICD-10-CM | POA: Diagnosis not present

## 2023-06-07 DIAGNOSIS — Z01818 Encounter for other preprocedural examination: Secondary | ICD-10-CM | POA: Diagnosis present

## 2023-06-07 DIAGNOSIS — K8020 Calculus of gallbladder without cholecystitis without obstruction: Secondary | ICD-10-CM | POA: Insufficient documentation

## 2023-06-07 DIAGNOSIS — Z01812 Encounter for preprocedural laboratory examination: Secondary | ICD-10-CM | POA: Insufficient documentation

## 2023-06-07 HISTORY — DX: Personal history of urinary calculi: Z87.442

## 2023-06-07 LAB — CBC
HCT: 42.7 % (ref 39.0–52.0)
Hemoglobin: 14.2 g/dL (ref 13.0–17.0)
MCH: 31.6 pg (ref 26.0–34.0)
MCHC: 33.3 g/dL (ref 30.0–36.0)
MCV: 95.1 fL (ref 80.0–100.0)
Platelets: 211 10*3/uL (ref 150–400)
RBC: 4.49 MIL/uL (ref 4.22–5.81)
RDW: 13.9 % (ref 11.5–15.5)
WBC: 9.6 10*3/uL (ref 4.0–10.5)
nRBC: 0 % (ref 0.0–0.2)

## 2023-06-07 LAB — BASIC METABOLIC PANEL
Anion gap: 9 (ref 5–15)
BUN: 10 mg/dL (ref 8–23)
CO2: 29 mmol/L (ref 22–32)
Calcium: 9.3 mg/dL (ref 8.9–10.3)
Chloride: 103 mmol/L (ref 98–111)
Creatinine, Ser: 1.24 mg/dL (ref 0.61–1.24)
GFR, Estimated: >60 mL/min (ref >60)
Glucose, Bld: 86 mg/dL (ref 70–99)
Potassium: 3.6 mmol/L (ref 3.5–5.1)
Sodium: 141 mmol/L (ref 135–145)

## 2023-06-07 NOTE — Progress Notes (Addendum)
PCP - Dr. Joycelyn Rua  Cardiologist - Dr. Ervin Knack Dr. Laveda Norman - [facility]- I sent perioperative device orders and requested that he complete themk and return them  EP-Dr Laveda Norman [facility]  Endocrine-no  Pulm-na  Chest x-ray - na  EKG - 11/13/22- [facility]- I requested   Stress Test - 2017  ECHO - 2022  Cardiac Cath - 2020  AICD- yes managed by [facility], I sent a request for Device orders  Barostim managed by [facility] Cardiologist- orders on chart.  Nerve Stimulaor-no  Dialysis-no  Sleep Study - no CPAP - no  LABS-CBC, BMP  ASA-continue- per surgeon Plavix - hold 5 days before surgery  ERAS-yes  HA1C-na GLP-1-no Fasting Blood Sugar - na Checks Blood Sugar _0___ times a day  Anesthesia-  Pt denies having chest pain, sob, or fever at this time. All instructions explained to the pt, with a verbal understanding of the material. Pt agrees to go over the instructions while at home for a better understanding. The opportunity to ask questions was provided.^

## 2023-06-07 NOTE — Progress Notes (Signed)
PERIOPERATIVE PRESCRIPTION FOR IMPLANTED CARDIAC DEVICE PROGRAMMING  Patient Information: Name:  Alan White  DOB:  08-12-1961  MRN:  [id number]  Planned Procedure:  Laparoscopy Cholecystectomy  Surgeon:  Dr. Feliciana Rossetti  Date of Procedure:  06/13/22  Cautery will be used.  Position during surgery:  Supine   Device Information:  Clinic EP Physician:  Loman Brooklyn, MD   Device Type:  BAROSTIM  Electrophysiologist's Recommendations:  No special action needed regarding barostim If you need to get an EKG prior to the procedure-place a magnet over the device  Per Device Clinic Standing Orders, Wiliam Ke, RN  8:34 AM 06/07/2023

## 2023-06-10 NOTE — Progress Notes (Signed)
Anesthesia Chart Review:  Case: [id number] Date/Time: 06/14/23 0715   Procedure: LAPAROSCOPIC CHOLECYSTECTOMY   Anesthesia type: General   Pre-op diagnosis: GALLSTONES   [facility]: [facility] ROOM 08 / [facility] OR   Surgeons: Kinsinger, De Blanch, MD       DISCUSSION: Patient is a 62 year old scheduled for the above procedure.  History includes smoking, CAD (MI, s/p overlapping DES proximal-mid LAD, DES mid CX 12/29/13; STEMI, s/p PTCA mid LAD 01/31/23), ischemic cardiomyopathy, HFrEF (s/p right Barostim Neo Implant 08/02/20, AICD (St. Jude/Abbott [place] VR [id number] ICD 12/17/19, left chest), dyslipidemia, GERD, nephrolithiasis.   He receives cardiology care both in [place] (Dr. Rennis Golden, Dr. Elberta Fortis) at at the [facility] (Dr. Laveda Norman, Dr. Lowella Bandy). He had preoperative telephonic evaluation on 04/11/23 by Micah Flesher, PA, "According to the [facility] [place] has a 6.6% Mace.  He can complete more than 4.0 METS without angina.  He does not have any unstable cardiac conditions at this time.  He understands his risk and wishes to proceed.   The patient was advised that if he develops new symptoms prior to surgery to contact our office to arrange for a follow-up visit, and he verbalized understanding.   Ok to hold Plavix 5 days prior to surgery- would like to continue low dose aspirin if possible perioperatively due to history of coronary thrombosis and prior stenting. Resume Plavix post-op as soon as safe from a bleeding standpoint.    Therefore according to [facility] guidelines, he may proceed with surgery without further testing." - Dr. Rennis Golden had previously commented on 04/05/23, "Ok to hold Plavix 5 days prior to surgery- would like to continue low dose aspirin if possible perioperatively due to history of coronary thrombosis and prior stenting. Resume Plavix post-op as soon as safe from a bleeding standpoint." Last Plavix was 06/08/23.   EP Barostim perioperative instructions: Device Information: Clinic EP Physician:  Loman Brooklyn, MD  Device Type:  BAROSTIM   Electrophysiologist's Recommendations: No special action needed regarding barostim If you need to get an EKG prior to the procedure-place a magnet over the device   St. Jude/Abbott ICD perioperative instructions requested from [facility]. ICD Remote Transmission as outlined in 05/17/23 [facility] Everywhere: Pulse Generator Interrogation Date: 2024-08-15T23:30:05.0000000 Z Encounter Info: Device/Lead(s): - No significant abnormalities - Available daily battery/lead measurements within expected range - Lead trends stable  2 brief NSVT.   He reported instructions to continue ASA but to hold Plavix for 5 days for procedure, last Plavix 06/08/23. He was treated for bronchitis, possible COPD exacerbation last month--symptoms started around 05/16/23. He was started on Levaquin but had a reaction after 2 days and was switched to doxycycline which he has completed. I called and spoke with Mr. Hart Rochester. He says by surgery date, he will have felt recovered for two weeks. He currently feels back to his baseline.   Anesthesia team to evaluate on the day of surgery. Anesthesia team to evaluate on the day of surgery. Currently awaiting EP and 11/2022 EKG tracing from [facility], re-requested from their cardiology department.   VS: BP 101/68   Pulse 77   Temp 36.6 C (Oral)   Resp 18   Ht 6' (1.829 m)   Wt 93.5 kg   SpO2 98%   BMI 27.97 kg/m    PROVIDERS: Joycelyn Rua, MD is PCP  Rennis Golden Lisette Abu, MD is cardiologist (primary). Also saw [facility] cardiologist Pete Pelt, MD on 11/13/22. Clent Ridges, Will, MD is EP cardiologist [facility]). ICD followed at [facility] by Lelon Mast, MD  LABS: Labs reviewed: Acceptable for surgery. (all labs ordered are listed, but only abnormal results are displayed)  Labs Reviewed  BASIC METABOLIC PANEL  CBC     IMAGES: CT Abd/pelvis 03/07/23: IMPRESSION: - Mildly dilated small bowel loops are noted with mild wall  thickening most consistent with enteritis. - Large gallstone is noted in neck of gallbladder without evidence of cholecystitis. - Aortic Atherosclerosis (ICD10-I70.0).   Chest CT LCS 02/12/23 ([facility]): Impression: Lung-RADS 4AS: 6 mm left lower lobe nodule has increased in size  from 4 mm on the previous examinations. Follow-up low dose CT in  3 months is recommended.  OTHER SIGNIFICANT FINDINGS AND RECOMMENDATIONS: Patchy upper lung  predominant groundglass attenuation is noted with suggestion of  mosaic attenuation in the lower lungs, similar in comparison to  the previous examinations. Interstitial lung disease such as  smoking-related ILD is among the differential considerations.  Pulmonary consultation is recommended if not already performed.     EKG: Requested last 11/13/22 EKG from [facility]. Per office note by Dr. Jodie Echevaria: "12-lead EKG today (interpreted by myself):  Sinus rhythm at 69 bpm. First-degree AV block. Old anterolateral infarct same  Q waves V1 to V6! Intervals (in milliseconds): PR 232, QRS 86, QTc 387; R axis 77.  Sinus rhythm at 69 bpm. First-degree AV block (PR 234 ms). Low voltage QRS.  Old AS MI (Q waves up to V5!) Intervals (in milliseconds): PR 234, QRS 88, QTc 407; R axis 55."  EKG 11/14/21 ( Sinus rhythm with first degree AV block Low voltage QRS Cannot rule out inferior infarct, age undetermined Cannot rule out anteroseptal infarct, age undetermine   CV: Echo 12/26/22 [facility] CE): Interpretation Summary Study Quality: Fair There is mild concentric left ventricular hypertrophy. Preserved LV systolic function. Ejection Fraction = 45-50% (Visual Estimation). EF= 49% by 2D biplane method. There is a moderate sized apical and anteroseptal wall motion abnormality with akinesis of the segments. The right ventricular systolic function is normal. There is a cardiac device lead in the right ventricle There is mild aortic sclerosis. The aortic valve opens  well. Mild pulmonic valvular regurgitation. There is trace mitral regurgitation. There is trace tricuspid regurgitation. The IVC is normal in size with an inspiratory collapse of greater then 50%, suggesting normal right atrial pressure. RV systolic pressure estimated to be 20-25 mmHg   Echo 08/08/21: IMPRESSIONS   1. Cannot exclude apical and mid to distal anterior hypokinesis.  Diasotlic function cannot be calculated due to paced rhythm although  appears to be normal. Left ventricular ejection fraction, by estimation,  is 40 to 45%. The left ventricle has mildly  decreased function. The left ventricle demonstrates global hypokinesis.  The left ventricular internal cavity size was mildly dilated.  Indeterminate diastolic filling due to E-A fusion.   2. Right ventricular systolic function is normal. The right ventricular  size is normal.   3. Left atrial size was mildly dilated.   4. The mitral valve is normal in structure. Trivial mitral valve  regurgitation. No evidence of mitral stenosis.   5. The aortic valve is tricuspid. Aortic valve regurgitation is not  visualized. Mild aortic valve sclerosis is present, with no evidence of  aortic valve stenosis.   6. The inferior vena cava is normal in size with greater than 50%  respiratory variability, suggesting right atrial pressure of 3 mmHg.  - Comparison(s): No significant change from prior study. 02/14/2021.    US Carotid 08/08/21: Summary:  - Right Carotid: The extracranial vessels  were near-normal with only minimal  wall thickening or plaque.  - Left Carotid: The extracranial vessels were near-normal with only minimal  wall thickening or plaque.  - Vertebrals:  Bilateral vertebral arteries demonstrate antegrade flow.  - Subclavians: Normal flow hemodynamics were seen in bilateral subclavian arteries.    Cardiac cath 01/31/19: Prox RCA lesion is 20% stenosed. Previously placed Prox Cx to Mid Cx stent (unknown type) is widely  patent. Ost LAD to Prox LAD lesion is 40% stenosed. Prox LAD lesion is 100% stenosed. Balloon angioplasty was performed using a BALLOON [place] EMERGE MR [id number]. Post intervention, there is a 0% residual stenosis.   1. Acute anterior STEMI secondary to thrombotic occlusion of the stented segment of the mid LAD  2. Successful PTCA with balloon angioplasty only of the mid LAD stented segment with excellent angiographic result 3. Patent stent mid Circumflex 4. Mild non-obstructive disease in the proximal LAD and proximal RCA 5. Elevated LVEDP   Recommendations: Will admit to ICU. Will continue DAPT with ASA and Brilinta for one year followed by DAPT with ASA and Plavix for lifetime. Continue statin and beta blocker. Will continue Aggrastat for 18 hours post PCI given large thrombus burden. Echo in am. Likely d/c home Monday if stable.    Past Medical History:  Diagnosis Date   AICD (automatic cardioverter/defibrillator) present    [employer]) [place] ICD   CAD (coronary artery disease), 12/29/13 PCI/DES LCX and PCI/DES LAD with overlapping DES  12/30/2013   cath 07/05/14 [place]   CHF (congestive heart failure) (HCC)    Dyslipidemia, goal LDL below 70 12/30/2013   GERD (gastroesophageal reflux disease)    History of kidney stones    Metabolic syndrome, HgbA1C 6.0  12/30/2013   Myocardial infarction [facility]) 2015,2020   Smoker    Wears partial dentures    upper    Past Surgical History:  Procedure Laterality Date   BAROREFLEX SYSTEM INSERTION Right 08/02/2020   Procedure: BAROREFLEX SYSTEM INSERTION;  Surgeon: Hillis Range, MD;  [facility] INV[facility] CV LAB;  Service: Cardiovascular;  Laterality: Right;   CARDIAC CATHETERIZATION  07/05/14,01/31/19   patent stents   CORONARY ANGIOPLASTY WITH STENT PLACEMENT  12/29/13   DES to LCX and overlapping stents DES mid LAD   CORONARY/GRAFT ACUTE MI REVASCULARIZATION N/A 01/31/2019   Procedure: Coronary/Graft Acute MI Revascularization;  Surgeon:  Kathleene Hazel, MD;  Location: [facility] IN[facility] CV LAB;  Service: Cardiovascular;  Laterality: N/A;   ICD IMPLANT N/A 12/17/2019   Procedure: ICD IMPLANT;  Surgeon: Regan Lemming, MD;  Location: [facility] INVASIVE CV LAB;  Service: Cardiovascular;  Laterality: N/A;   LEFT HEART CATH AND CORONARY ANGIOGRAPHY N/A 01/31/2019   Procedure: LEFT HEART CATH AND CORONARY ANGIOGRAPHY;  Surgeon: Kathleene Hazel, MD;  [facility] CV [facility];  Service: Cardiovascular;  Laterality: N/A;   LEFT HEART CATHETERIZATION WITH CORONARY ANGIOGRAM N/A 12/29/2013   Procedure: LEFT HEART CATHETERIZATION WITH CORONARY ANGIOGRAM;  Surgeon: [name], MD;  Location: [facility] CATH LAB;  Service: Cardiovascular;  Laterality: N/A;   LEFT HEART CATHETERIZATION WITH CORONARY ANGIOGRAM N/A 07/05/2014   Procedure: LEFT HEART CATHETERIZATION WITH CORONARY ANGIOGRAM;  Surgeon: Peter M Swaziland, MD;  Location: [facility] CATH LAB;  Service: Cardiovascular;  Laterality: N/A;   PERCUTANEOUS CORONARY STENT INTERVENTION (PCI-S)  12/29/2013   Procedure: PERCUTANEOUS CORONARY STENT INTERVENTION (PCI-S);  Surgeon: [name], MD;  Location: [facility] CATH LAB;  Service: Cardiovascular;;    MEDICATIONS:  aspirin EC  81 MG tablet   atorvastatin (LIPITOR) 80 MG tablet   clopidogrel (PLAVIX) 75 MG tablet   doxycycline (VIBRA-TABS) 100 MG tablet   famotidine (PEPCID) 40 MG tablet   isosorbide mononitrate (IMDUR) 30 MG 24 hr tablet   metoprolol succinate (TOPROL XL) 25 MG 24 hr tablet   nitroGLYCERIN (NITROSTAT) 0.4 MG SL tablet   sacubitril-valsartan (ENTRESTO) 24-26 MG   spironolactone (ALDACTONE) 25 MG tablet   No current facility-administered medications for this encounter.    Shonna Chock, PA-C Surgical Short Stay/Anesthesiology [facility] Phone [phone] [facility] Phone [phone] 06/10/2023 11:22 AM

## 2023-06-10 NOTE — Anesthesia Preprocedure Evaluation (Addendum)
Anesthesia Evaluation  Patient identified by MRN, date of birth, ID band Patient awake    Reviewed: Allergy & Precautions, NPO status , Patient's Chart, lab work & pertinent test results  Airway Mallampati: III  TM Distance: >3 FB Neck ROM: Limited    Dental  (+) Dental Advisory Given, Edentulous Upper, Partial Lower, Missing   Pulmonary sleep apnea , Current Smoker and Patient abstained from smoking.   Pulmonary exam normal breath sounds clear to auscultation       Cardiovascular + angina  + CAD, + Past MI, + Cardiac Stents and +CHF  + Cardiac Defibrillator  Rhythm:Regular Rate:Normal  AICD <1% VP, >99% VS  Echo 11/2022 There is mild concentric left ventricular hypertrophy. Preserved LV systolic function. Ejection Fraction = 45-50% (Visual Estimation). EF= 49% by 2D biplane method. There is a moderate sized apical and anteroseptal wall motion abnormality with akinesis of the segments. The right ventricular systolic function is normal. There is a cardiac device lead in the right ventricle There is mild aortic sclerosis. The aortic valve opens well. Mild pulmonic valvular regurgitation. There is trace mitral regurgitation. There is trace tricuspid regurgitation. The IVC is normal in size with an inspiratory collapse of greater then 50%, suggesting normal right atrial pressure. RV systolic pressure estimated to be 20-25 mmHg    Echo 2022  1. Cannot exclude apical and mid to distal anterior hypokinesis. Diasotlic function cannot be calculated due to paced rhythm although appears to be normal. Left ventricular ejection fraction, by estimation, is 40 to 45%. The left ventricle has mildly  decreased function. The left ventricle demonstrates global hypokinesis. The left ventricular internal cavity size was mildly dilated. Indeterminate diastolic filling due to E-A fusion.   2. Right ventricular systolic function is normal. The right  ventricular size is normal.   3. Left atrial size was mildly dilated.   4. The mitral valve is normal in structure. Trivial mitral valve regurgitation. No evidence of mitral stenosis.   5. The aortic valve is tricuspid. Aortic valve regurgitation is not visualized. Mild aortic valve sclerosis is present, with no evidence of aortic valve stenosis.   6. The inferior vena cava is normal in size with greater than 50% respiratory variability, suggesting right atrial pressure of 3 mmHg.   Comparison(s): No significant change from prior study. 02/14/2021.     ECHO:  1. Technically difficult; akinesis of the distal anteroseptal and apical walls; overall moderate to severe LV dysfunction; grade 2 diastolic dysfunction.  2. Left ventricular ejection fraction, by estimation, is 30 to 35%. The left ventricle has moderate to severely decreased function. The left ventricle demonstrates regional wall motion abnormalities (see scoring  diagram/findings for description). Left  ventricular diastolic parameters are consistent with Grade II diastolic dysfunction (pseudonormalization).  3. Right ventricular systolic function is normal. The right ventricular size is normal. Tricuspid regurgitation signal is inadequate for assessing PA pressure.  4. The mitral valve is normal in structure. Trivial mitral valve regurgitation. No evidence of mitral stenosis.  5. The aortic valve is tricuspid. Aortic valve regurgitation is not visualized. Mild aortic valve sclerosis is present, with no evidence of aortic valve stenosis.  6. The inferior vena cava is normal in size with greater than 50% respiratory variability, suggesting right atrial pressure of 3 mmHg.   Neuro/Psych   Anxiety     negative neurological ROS     GI/Hepatic Neg liver ROS,GERD  Medicated and Controlled,,  Endo/Other  negative endocrine ROS    Renal/GU negative  Renal ROS     Musculoskeletal negative musculoskeletal ROS (+)    Abdominal    Peds  Hematology HLD   Anesthesia Other Findings heart failure  Reproductive/Obstetrics                             Anesthesia Physical Anesthesia Plan  ASA: 4  Anesthesia Plan: General   Post-op Pain Management: Tylenol PO (pre-op)*   Induction: Intravenous  PONV Risk Score and Plan: 1 and Ondansetron, Dexamethasone and Treatment may vary due to age or medical condition  Airway Management Planned: Oral ETT  Additional Equipment: Arterial line  Intra-op Plan:   Post-operative Plan: Extubation in OR  Informed Consent: I have reviewed the patients History and Physical, chart, labs and discussed the procedure including the risks, benefits and alternatives for the proposed anesthesia with the patient or authorized representative who has indicated his/her understanding and acceptance.     Dental advisory given  Plan Discussed with: CRNA  Anesthesia Plan Comments: (PAT note written 06/10/2023 by Shonna Chock, PA-C.   )        Anesthesia Quick Evaluation

## 2023-06-14 ENCOUNTER — Encounter (HOSPITAL_COMMUNITY): Payer: Self-pay | Admitting: General Surgery

## 2023-06-14 ENCOUNTER — Encounter (HOSPITAL_COMMUNITY)
Admission: RE | Disposition: A | Discharge status: Home / Self Care | Payer: Self-pay | Source: Home / Self Care | Attending: General Surgery

## 2023-06-14 ENCOUNTER — Other Ambulatory Visit: Payer: Self-pay

## 2023-06-14 ENCOUNTER — Ambulatory Visit (HOSPITAL_BASED_OUTPATIENT_CLINIC_OR_DEPARTMENT_OTHER): Payer: PPO | Admitting: Anesthesiology

## 2023-06-14 ENCOUNTER — Ambulatory Visit (HOSPITAL_COMMUNITY)
Admission: RE | Admit: 2023-06-14 | Discharge: 2023-06-14 | Disposition: A | Discharge status: Home / Self Care | Payer: PPO | Attending: General Surgery | Admitting: General Surgery

## 2023-06-14 ENCOUNTER — Ambulatory Visit (HOSPITAL_COMMUNITY): Payer: PPO

## 2023-06-14 ENCOUNTER — Ambulatory Visit (HOSPITAL_COMMUNITY): Payer: PPO | Admitting: Vascular Surgery

## 2023-06-14 DIAGNOSIS — I252 Old myocardial infarction: Secondary | ICD-10-CM | POA: Insufficient documentation

## 2023-06-14 DIAGNOSIS — I509 Heart failure, unspecified: Secondary | ICD-10-CM | POA: Diagnosis not present

## 2023-06-14 DIAGNOSIS — I25119 Atherosclerotic heart disease of native coronary artery with unspecified angina pectoris: Secondary | ICD-10-CM

## 2023-06-14 DIAGNOSIS — I255 Ischemic cardiomyopathy: Secondary | ICD-10-CM | POA: Diagnosis not present

## 2023-06-14 DIAGNOSIS — G4730 Sleep apnea, unspecified: Secondary | ICD-10-CM | POA: Diagnosis not present

## 2023-06-14 DIAGNOSIS — I700 Atherosclerosis of aorta: Secondary | ICD-10-CM | POA: Insufficient documentation

## 2023-06-14 DIAGNOSIS — Z7902 Long term (current) use of antithrombotics/antiplatelets: Secondary | ICD-10-CM | POA: Insufficient documentation

## 2023-06-14 DIAGNOSIS — K8010 Calculus of gallbladder with chronic cholecystitis without obstruction: Secondary | ICD-10-CM | POA: Insufficient documentation

## 2023-06-14 DIAGNOSIS — F419 Anxiety disorder, unspecified: Secondary | ICD-10-CM | POA: Diagnosis not present

## 2023-06-14 DIAGNOSIS — K8020 Calculus of gallbladder without cholecystitis without obstruction: Secondary | ICD-10-CM | POA: Diagnosis not present

## 2023-06-14 DIAGNOSIS — I371 Nonrheumatic pulmonary valve insufficiency: Secondary | ICD-10-CM | POA: Diagnosis not present

## 2023-06-14 DIAGNOSIS — E785 Hyperlipidemia, unspecified: Secondary | ICD-10-CM | POA: Diagnosis not present

## 2023-06-14 DIAGNOSIS — K828 Other specified diseases of gallbladder: Secondary | ICD-10-CM | POA: Insufficient documentation

## 2023-06-14 DIAGNOSIS — Z9049 Acquired absence of other specified parts of digestive tract: Secondary | ICD-10-CM | POA: Diagnosis not present

## 2023-06-14 DIAGNOSIS — Z955 Presence of coronary angioplasty implant and graft: Secondary | ICD-10-CM | POA: Diagnosis not present

## 2023-06-14 DIAGNOSIS — I5042 Chronic combined systolic (congestive) and diastolic (congestive) heart failure: Secondary | ICD-10-CM | POA: Diagnosis not present

## 2023-06-14 DIAGNOSIS — F17210 Nicotine dependence, cigarettes, uncomplicated: Secondary | ICD-10-CM | POA: Insufficient documentation

## 2023-06-14 DIAGNOSIS — Z79899 Other long term (current) drug therapy: Secondary | ICD-10-CM | POA: Diagnosis not present

## 2023-06-14 DIAGNOSIS — J9811 Atelectasis: Secondary | ICD-10-CM | POA: Diagnosis not present

## 2023-06-14 DIAGNOSIS — Z950 Presence of cardiac pacemaker: Secondary | ICD-10-CM | POA: Diagnosis not present

## 2023-06-14 DIAGNOSIS — I517 Cardiomegaly: Secondary | ICD-10-CM | POA: Insufficient documentation

## 2023-06-14 DIAGNOSIS — K219 Gastro-esophageal reflux disease without esophagitis: Secondary | ICD-10-CM | POA: Diagnosis not present

## 2023-06-14 HISTORY — PX: CHOLECYSTECTOMY: SHX55

## 2023-06-14 SURGERY — LAPAROSCOPIC CHOLECYSTECTOMY
Anesthesia: General

## 2023-06-14 MED ORDER — PROPOFOL 10 MG/ML IV BOLUS
INTRAVENOUS | Status: DC | PRN
  Administered 2023-06-14: 130 mg via INTRAVENOUS

## 2023-06-14 MED ORDER — ROCURONIUM BROMIDE 10 MG/ML (PF) SYRINGE
PREFILLED_SYRINGE | INTRAVENOUS | Status: DC | PRN
  Administered 2023-06-14: 70 mg via INTRAVENOUS

## 2023-06-14 MED ORDER — ONDANSETRON HCL 4 MG/2ML IJ SOLN
INTRAMUSCULAR | Status: DC | PRN
  Administered 2023-06-14: 4 mg via INTRAVENOUS

## 2023-06-14 MED ORDER — PHENYLEPHRINE 80 MCG/ML (10ML) SYRINGE FOR IV PUSH (FOR BLOOD PRESSURE SUPPORT)
PREFILLED_SYRINGE | INTRAVENOUS | Status: DC | PRN
  Administered 2023-06-14 (×2): 80 ug via INTRAVENOUS

## 2023-06-14 MED ORDER — LIDOCAINE 2% (20 MG/ML) 5 ML SYRINGE
INTRAMUSCULAR | Status: AC
  Filled 2023-06-14: qty 5

## 2023-06-14 MED ORDER — BUPIVACAINE-EPINEPHRINE (PF) 0.25% -1:200000 IJ SOLN
INTRAMUSCULAR | Status: AC
  Filled 2023-06-14: qty 30

## 2023-06-14 MED ORDER — CHLORHEXIDINE GLUCONATE CLOTH 2 % EX PADS: 6.0000 | MEDICATED_PAD | Freq: Once | CUTANEOUS | Status: DC

## 2023-06-14 MED ORDER — DROPERIDOL 2.5 MG/ML IJ SOLN: 0.6250 mg | Freq: Once | INTRAMUSCULAR | Status: DC | PRN

## 2023-06-14 MED ORDER — LACTATED RINGERS IV SOLN: INTRAVENOUS | Status: DC

## 2023-06-14 MED ORDER — LIDOCAINE 2% (20 MG/ML) 5 ML SYRINGE
INTRAMUSCULAR | Status: DC | PRN
  Administered 2023-06-14: 90 mg via INTRAVENOUS

## 2023-06-14 MED ORDER — MIDAZOLAM HCL 2 MG/2ML IJ SOLN
INTRAMUSCULAR | Status: AC
  Filled 2023-06-14: qty 2

## 2023-06-14 MED ORDER — FENTANYL CITRATE (PF) 250 MCG/5ML IJ SOLN
INTRAMUSCULAR | Status: DC | PRN
  Administered 2023-06-14 (×4): 50 ug via INTRAVENOUS

## 2023-06-14 MED ORDER — METOPROLOL SUCCINATE ER 25 MG PO TB24
ORAL_TABLET | ORAL | Status: AC
  Administered 2023-06-14: 12.5 mg via ORAL
  Filled 2023-06-14: qty 1

## 2023-06-14 MED ORDER — DEXAMETHASONE SODIUM PHOSPHATE 10 MG/ML IJ SOLN
INTRAMUSCULAR | Status: AC
  Filled 2023-06-14: qty 1

## 2023-06-14 MED ORDER — ONDANSETRON HCL 4 MG/2ML IJ SOLN
INTRAMUSCULAR | Status: AC
  Filled 2023-06-14: qty 2

## 2023-06-14 MED ORDER — CHLORHEXIDINE GLUCONATE 0.12 % MT SOLN
15.0000 mL | Freq: Once | OROMUCOSAL | Status: AC
  Administered 2023-06-14: 15 mL via OROMUCOSAL
  Filled 2023-06-14: qty 15

## 2023-06-14 MED ORDER — MIDAZOLAM HCL 2 MG/2ML IJ SOLN
INTRAMUSCULAR | Status: DC | PRN
  Administered 2023-06-14: 2 mg via INTRAVENOUS

## 2023-06-14 MED ORDER — ORAL CARE MOUTH RINSE: 15.0000 mL | Freq: Once | OROMUCOSAL | Status: AC

## 2023-06-14 MED ORDER — BUPIVACAINE-EPINEPHRINE (PF) 0.5% -1:200000 IJ SOLN
INTRAMUSCULAR | Status: AC
  Filled 2023-06-14: qty 30

## 2023-06-14 MED ORDER — PHENYLEPHRINE 80 MCG/ML (10ML) SYRINGE FOR IV PUSH (FOR BLOOD PRESSURE SUPPORT)
PREFILLED_SYRINGE | INTRAVENOUS | Status: AC
  Filled 2023-06-14: qty 10

## 2023-06-14 MED ORDER — BUPIVACAINE-EPINEPHRINE 0.25% -1:200000 IJ SOLN
INTRAMUSCULAR | Status: DC | PRN
  Administered 2023-06-14: 30 mL

## 2023-06-14 MED ORDER — HYDROMORPHONE HCL 1 MG/ML IJ SOLN
0.2500 mg | INTRAMUSCULAR | Status: DC | PRN
  Administered 2023-06-14: 0.5 mg via INTRAVENOUS

## 2023-06-14 MED ORDER — OXYCODONE HCL 5 MG PO TABS
5.0000 mg | ORAL_TABLET | Freq: Four times a day (QID) | ORAL | Status: DC | PRN
Start: 2023-06-14 — End: 2023-08-05

## 2023-06-14 MED ORDER — FENTANYL CITRATE (PF) 250 MCG/5ML IJ SOLN
INTRAMUSCULAR | Status: AC
  Filled 2023-06-14: qty 5

## 2023-06-14 MED ORDER — DEXAMETHASONE SODIUM PHOSPHATE 10 MG/ML IJ SOLN
INTRAMUSCULAR | Status: DC | PRN
  Administered 2023-06-14: 5 mg via INTRAVENOUS

## 2023-06-14 MED ORDER — METOPROLOL SUCCINATE 12.5 MG HALF TABLET
12.5000 mg | ORAL_TABLET | Freq: Once | ORAL | Status: AC
  Filled 2023-06-14: qty 1

## 2023-06-14 MED ORDER — CEFAZOLIN SODIUM-DEXTROSE 2-4 GM/100ML-% IV SOLN
2.0000 g | INTRAVENOUS | Status: AC
  Administered 2023-06-14: 2 g via INTRAVENOUS
  Filled 2023-06-14: qty 100

## 2023-06-14 MED ORDER — PROPOFOL 10 MG/ML IV BOLUS
INTRAVENOUS | Status: AC
  Filled 2023-06-14: qty 20

## 2023-06-14 MED ORDER — SUGAMMADEX SODIUM 200 MG/2ML IV SOLN
INTRAVENOUS | Status: DC | PRN
  Administered 2023-06-14: 200 mg via INTRAVENOUS

## 2023-06-14 MED ORDER — DEXMEDETOMIDINE HCL IN NACL 80 MCG/20ML IV SOLN
INTRAVENOUS | Status: DC | PRN
Start: 2023-06-14 — End: 2023-06-14
  Administered 2023-06-14: 4 ug via INTRAVENOUS

## 2023-06-14 MED ORDER — HYDROMORPHONE HCL 1 MG/ML IJ SOLN
INTRAMUSCULAR | Status: AC
  Administered 2023-06-14: 0.5 mg via INTRAVENOUS
  Filled 2023-06-14: qty 1

## 2023-06-14 MED ORDER — ACETAMINOPHEN 500 MG PO TABS
1000.0000 mg | ORAL_TABLET | ORAL | Status: AC
  Administered 2023-06-14: 1000 mg via ORAL
  Filled 2023-06-14: qty 2

## 2023-06-14 MED ORDER — SPY AGENT GREEN - (INDOCYANINE FOR INJECTION)
1.2500 mg | Freq: Once | INTRAMUSCULAR | Status: AC
  Administered 2023-06-14: 1.25 mg via INTRAVENOUS
  Filled 2023-06-14: qty 10

## 2023-06-14 SURGICAL SUPPLY — 44 items
ADH SKN CLS APL DERMABOND .7 (GAUZE/BANDAGES/DRESSINGS) ×1
APL PRP STRL LF DISP 70% ISPRP (MISCELLANEOUS) ×1
APPLIER CLIP ROT 10 11.4 M/L (STAPLE) ×1
APR CLP MED LRG 11.4X10 (STAPLE) ×1
BAG COUNTER SPONGE SURGICOUNT (BAG) ×1 IMPLANT
BAG SPEC RTRVL 10 TROC 200 (ENDOMECHANICALS) ×1
BAG SPNG CNTER NS LX DISP (BAG) ×1
BLADE CLIPPER SURG (BLADE) IMPLANT
CANISTER SUCT 3000ML PPV (MISCELLANEOUS) ×1 IMPLANT
CHLORAPREP W/TINT 26 (MISCELLANEOUS) ×1 IMPLANT
CLIP APPLIE ROT 10 11.4 M/L (STAPLE) IMPLANT
CLIP LIGATING HEMO LOK XL GOLD (MISCELLANEOUS) IMPLANT
CLIP LIGATING HEMO O LOK GREEN (MISCELLANEOUS) ×1 IMPLANT
COVER SURGICAL LIGHT HANDLE (MISCELLANEOUS) ×1 IMPLANT
DERMABOND ADVANCED .7 DNX12 (GAUZE/BANDAGES/DRESSINGS) ×1 IMPLANT
ELECT REM PT RETURN 9FT ADLT (ELECTROSURGICAL) ×1
ELECTRODE REM PT RTRN 9FT ADLT (ELECTROSURGICAL) ×1 IMPLANT
GLOVE BIOGEL PI IND STRL 7.0 (GLOVE) ×1 IMPLANT
GLOVE SURG SS PI 7.0 STRL IVOR (GLOVE) ×1 IMPLANT
GOWN STRL REUS W/ TWL LRG LVL3 (GOWN DISPOSABLE) ×3 IMPLANT
GOWN STRL REUS W/TWL LRG LVL3 (GOWN DISPOSABLE) ×3
GRASPER SUT TROCAR 14GX15 (MISCELLANEOUS) ×1 IMPLANT
IRRIG SUCT STRYKERFLOW 2 WTIP (MISCELLANEOUS) ×1
IRRIGATION SUCT STRKRFLW 2 WTP (MISCELLANEOUS) ×1 IMPLANT
KIT BASIN OR (CUSTOM PROCEDURE TRAY) ×1 IMPLANT
KIT IMAGING PINPOINTPAQ (MISCELLANEOUS) IMPLANT
KIT TURNOVER KIT B (KITS) ×1 IMPLANT
NDL 22X1.5 STRL (OR ONLY) (MISCELLANEOUS) ×1 IMPLANT
NEEDLE 22X1.5 STRL (OR ONLY) (MISCELLANEOUS) ×1 IMPLANT
NS IRRIG 1000ML POUR BTL (IV SOLUTION) ×1 IMPLANT
PAD ARMBOARD 7.5X6 YLW CONV (MISCELLANEOUS) ×1 IMPLANT
POUCH RETRIEVAL ECOSAC 10 (ENDOMECHANICALS) ×1 IMPLANT
SCISSORS LAP 5X35 DISP (ENDOMECHANICALS) ×1 IMPLANT
SET TUBE SMOKE EVAC HIGH FLOW (TUBING) ×1 IMPLANT
SLEEVE Z-THREAD 5X100MM (TROCAR) ×2 IMPLANT
SPECIMEN JAR SMALL (MISCELLANEOUS) ×1 IMPLANT
SUT MNCRL AB 4-0 PS2 18 (SUTURE) ×1 IMPLANT
TOWEL GREEN STERILE (TOWEL DISPOSABLE) ×1 IMPLANT
TOWEL GREEN STERILE FF (TOWEL DISPOSABLE) ×1 IMPLANT
TRAY LAPAROSCOPIC MC (CUSTOM PROCEDURE TRAY) ×1 IMPLANT
TROCAR Z THREAD OPTICAL 12X100 (TROCAR) ×1 IMPLANT
TROCAR Z-THREAD OPTICAL 5X100M (TROCAR) ×1 IMPLANT
WARMER LAPAROSCOPE (MISCELLANEOUS) ×1 IMPLANT
WATER STERILE IRR 1000ML POUR (IV SOLUTION) ×1 IMPLANT

## 2023-06-14 NOTE — Op Note (Signed)
PATIENT:  Alan White  62 y.o. male  PRE-OPERATIVE DIAGNOSIS:  GALLSTONES  POST-OPERATIVE DIAGNOSIS:  GALLSTONES  PROCEDURE:  Procedure(s): LAPAROSCOPIC CHOLECYSTECTOMY   SURGEON:  Aroush Chasse, De Blanch, MD   ASSISTANT: none  ANESTHESIA:   local and general  Indications for procedure: [name] is a 62 y.o. male with symptoms of Abdominal pain and Nausea and vomiting consistent with gallbladder disease, Confirmed by ultrasound.  Description of procedure: The patient was brought into the operative suite, placed supine. Anesthesia was administered with endotracheal tube. Patient was strapped in place and foot board was secured. All pressure points were offloaded by foam padding. The patient was prepped and draped in the usual sterile fashion.  A periumbilical incision was made and optical entry was used to enter the abdomen. 2 5 mm trocars were placed on in the right lateral space on in the right subcostal space. A 12mm trocar was placed in the subxiphoid space. Marcaine was infused to the subxiphoid space and lateral upper right abdomen in the transversus abdominis plane. Next the patient was placed in reverse trendelenberg. The gallbladder appearedfull of stones and chronically inflamed. Omentum was adhered to the gallbladder and was taken down with cautery/blunt dissection.  The gallbladder was retracted cephalad and lateral. The peritoneum was reflected off the infundibulum working lateral to medial. The cystic duct and cystic artery were identified and further dissection revealed a critical view. The cystic duct and cystic artery were doubly clipped and ligated.   The gallbladder was removed off the liver bed with cautery. The Gallbladder was placed in a specimen bag. The gallbladder fossa was irrigated and hemostasis was applied with cautery. The gallbladder was removed via the 12mm trocar. The fascial defect was closed with interrupted 0 vicryl suture via laparoscopic  trans-fascial suture passer. Pneumoperitoneum was removed, all trocar were removed. All incisions were closed with 4-0 monocryl subcuticular stitch. The patient woke from anesthesia and was brought to PACU in stable condition. All counts were correct  Findings: chronically inflamed gallbladder  Specimen: gallbladder  Blood loss: 100 ml ml  Local anesthesia: 30 ml Marcaine  Complications: none  PLAN OF CARE: Discharge to home after PACU  PATIENT DISPOSITION:  PACU - hemodynamically stable.  De Blanch [facility], [place]

## 2023-06-14 NOTE — Anesthesia Procedure Notes (Signed)
Procedure Name: Intubation Date/Time: 06/14/2023 7:45 AM  Performed by: Sandie Ano, CRNAPre-anesthesia Checklist: Patient identified, Emergency Drugs available, Suction available and Patient being monitored Patient Re-evaluated:Patient Re-evaluated prior to induction Oxygen Delivery Method: Circle System Utilized Preoxygenation: Pre-oxygenation with 100% oxygen Induction Type: IV induction Ventilation: Mask ventilation without difficulty Laryngoscope Size: Miller and 2 Grade View: Grade I Tube type: Oral Tube size: 7.0 mm Number of attempts: 1 Airway Equipment and Method: Stylet and Oral airway Placement Confirmation: ETT inserted through vocal cords under direct vision, positive ETCO2 and breath sounds checked- equal and bilateral Secured at: 23 cm Tube secured with: Tape Dental Injury: Teeth and Oropharynx as per pre-operative assessment

## 2023-06-14 NOTE — Discharge Instructions (Signed)
[facility] ______[facility] LAPAROSCOPIC SURGERY: POST OP INSTRUCTIONS Always review your discharge instruction sheet given to you by the facility where your surgery was performed. IF YOU HAVE DISABILITY OR FAMILY LEAVE FORMS, YOU MUST BRING THEM TO THE OFFICE FOR PROCESSING.   DO NOT GIVE THEM TO YOUR DOCTOR.  A prescription for pain medication may be given to you upon discharge.  Take your pain medication as prescribed, if needed.  If narcotic pain medicine is not needed, then you may take acetaminophen (Tylenol) or ibuprofen (Advil) as needed. Take your usually prescribed medications unless otherwise directed. If you need a refill on your pain medication, please contact your pharmacy.  They will contact our office to request authorization. Prescriptions will not be filled after 5pm or on week-ends. You should follow a light diet the first few days after arrival home, such as soup and crackers, etc.  Be sure to include lots of fluids daily. Most patients will experience some swelling and bruising in the area of the incisions.  Ice packs will help.  Swelling and bruising can take several days to resolve.  It is common to experience some constipation if taking pain medication after surgery.  Increasing fluid intake and taking a stool softener (such as Colace) will usually help or prevent this problem from occurring.  A mild laxative (Milk of Magnesia or Miralax) should be taken according to package instructions if there are no bowel movements after 48 hours. Unless discharge instructions indicate otherwise, you may remove your bandages 24-48 hours after surgery, and you may shower at that time.  You may have steri-strips (small skin tapes) in place directly over the incision.  These strips should be left on the skin for 7-10 days.  If your surgeon used skin glue on the incision, you may shower in 24 hours.  The glue will flake off over the next 2-3 weeks.  Any sutures or staples will be  removed at the office during your follow-up visit. ACTIVITIES:  You may resume regular (light) daily activities beginning the next day--such as daily self-care, walking, climbing stairs--gradually increasing activities as tolerated.  You may have sexual intercourse when it is comfortable.  Refrain from any heavy lifting or straining until approved by your doctor. You may drive when you are no longer taking prescription pain medication, you can comfortably wear a seatbelt, and you can safely maneuver your car and apply brakes. RETURN TO WORK:  __________________________________________________________ Bonita Quin should see your doctor in the office for a follow-up appointment approximately 2-3 weeks after your surgery.  Make sure that you call for this appointment within a day or two after you arrive home to insure a convenient appointment time. OTHER INSTRUCTIONS: __________________________________________________________________________________________________________________________ __________________________________________________________________________________________________________________________ WHEN TO CALL YOUR DOCTOR: Fever over 101.0 Inability to urinate Continued bleeding from incision. Increased pain, redness, or drainage from the incision. Increasing abdominal pain  The clinic staff is available to answer your questions during regular business hours.  Please don't hesitate to call and ask to speak to one of the nurses for clinical concerns.  If you have a medical emergency, go to the nearest emergency room or call 911.  A surgeon from [facility] is always on call at the hospital. [address], [address], [place], [place]  [address] ? [address] [address], [place], [place]   [address] [phone] ? [phone] ? FAX [phone] Web site: [email or web address]

## 2023-06-14 NOTE — Transfer of Care (Signed)
Immediate Anesthesia Transfer of Care Note  Patient: Alan White  Procedure(s) Performed: LAPAROSCOPIC CHOLECYSTECTOMY  Patient Location: PACU  Anesthesia Type:General  Level of Consciousness: awake, oriented, and patient cooperative  Airway & Oxygen Therapy: Patient Spontanous Breathing  Post-op Assessment: Report given to RN, Post -op Vital signs reviewed and stable, and Patient moving all extremities  Post vital signs: Reviewed and stable  Last Vitals:  Vitals Value Taken Time  BP 138/87 06/14/23 0845  Temp    Pulse 87 06/14/23 0847  Resp 21 06/14/23 0847  SpO2 96 % 06/14/23 0847  Vitals shown include unfiled device data.  Last Pain:  Vitals:   06/14/23 0715  TempSrc:   PainSc: 0-No pain         Complications: No notable events documented.

## 2023-06-14 NOTE — Anesthesia Postprocedure Evaluation (Signed)
Anesthesia Post Note  Patient: Nichols Demarest  Procedure(s) Performed: LAPAROSCOPIC CHOLECYSTECTOMY     Patient location during evaluation: PACU Anesthesia Type: General Level of consciousness: sedated and patient cooperative Pain management: pain level controlled Vital Signs Assessment: post-procedure vital signs reviewed and stable Respiratory status: spontaneous breathing Cardiovascular status: stable Anesthetic complications: no Comments: Some mild hypoxemia in PACU. Pt encouraged to use IS, walked around unit. Eventually, pt was able to maintain SpO2 in high 90's consistently. D/c'd home without issue.   No notable events documented.  Last Vitals:  Vitals:   06/14/23 0950 06/14/23 1040  BP:    Pulse: 74 62  Resp:    Temp:  (!) 36.1 C  SpO2: (!) 85% 99%    Last Pain:  Vitals:   06/14/23 0930  TempSrc:   PainSc: 3                  Lewie Loron

## 2023-06-14 NOTE — Progress Notes (Signed)
Patient ambulated PACU with sat monitor in place. O2 sat range 87-96 with ambulation. No c/o shortness of breath or fatigue. Patient stated that he is currently smokes 1/2 pack per day. Last time being 4am this morning.MD notified of his ambulation status.

## 2023-06-14 NOTE — H&P (Signed)
Chief Complaint: Cholelithiasis  History of Present Illness: Alan White is a 62 y.o. male who is seen today for gallstones.  He was scheduled for lap chole 11/16 but had lots of things going on and canceled. He continues to have diarrhea and intermittent abdominal pain. Pain is periumbilical and radiates to the epigastrium. It can last 1-2 hours but 2 weeks ago lasted more than a day.  Review of Systems: A complete review of systems was obtained from the patient. I have reviewed this information and discussed as appropriate with the patient. See HPI as well for other ROS.  Review of Systems  Constitutional: Negative.  HENT: Negative.  Eyes: Negative.  Respiratory: Negative.  Cardiovascular: Negative.  Gastrointestinal: Positive for abdominal pain, diarrhea and nausea.  Genitourinary: Negative.  Musculoskeletal: Negative.  Skin: Negative.  Neurological: Negative.  Endo/Heme/Allergies: Negative.  Psychiatric/Behavioral: Negative.    Medical History: Past Medical History:  Diagnosis Date  CHF (congestive heart failure) (CMS/HHS-HCC)   There is no problem list on file for this patient.  Past Surgical History:  Procedure Laterality Date  pacemaker upper left chest    No Known Allergies  Current Outpatient Medications on File Prior to Visit  Medication Sig Dispense Refill  aspirin 81 MG chewable tablet Take by mouth  metoprolol succinate (TOPROL-XL) 25 MG XL tablet Take by mouth  sacubitriL-valsartan (ENTRESTO) 24-26 mg tablet Take by mouth  spironolactone (ALDACTONE) 25 MG tablet Take by mouth  atorvastatin (LIPITOR) 80 MG tablet Take 80 mg by mouth once daily  carvediloL (COREG) 3.125 MG tablet Take 1 tablet by mouth 2 (two) times daily with meals (Patient not taking: Reported on 04/05/2023)  clopidogreL (PLAVIX) 75 mg tablet Take 1 tablet by mouth once daily  nitroGLYcerin (NITROSTAT) 0.4 MG SL tablet Place under the tongue  pantoprazole (PROTONIX) 40 MG DR tablet  Take 40 mg by mouth once daily   No current facility-administered medications on file prior to visit.   Family History  Problem Relation Age of Onset  Skin cancer Father  Coronary Artery Disease (Blocked arteries around heart) Father  Skin cancer Sister  Coronary Artery Disease (Blocked arteries around heart) Brother  Diabetes Brother    Social History   Tobacco Use  Smoking Status Some Days  Types: Cigarettes  Smokeless Tobacco Current    Social History   Socioeconomic History  Marital status: Married  Tobacco Use  Smoking status: Some Days  Types: Cigarettes  Smokeless tobacco: Current  Substance and Sexual Activity  Alcohol use: Never  Drug use: Never   Objective:   Vitals:  04/05/23 1102  Pulse: 91  Temp: 36.5 C (97.7 F)  SpO2: 98%  Weight: 91.7 kg (202 lb 3.2 oz)  Height: 182.9 cm (6')  PainSc: 2   Body mass index is 27.42 kg/m.  Physical Exam Constitutional:  Appearance: Normal appearance.  HENT:  Head: Normocephalic and atraumatic.  Pulmonary:  Effort: Pulmonary effort is normal.  Abdominal:  Comments: Nontender, no hernias  Musculoskeletal:  General: Normal range of motion.  Cervical back: Normal range of motion.  Neurological:  General: No focal deficit present.  Mental Status: He is alert and oriented to person, place, and time. Mental status is at baseline.  Psychiatric:  Mood and Affect: Mood normal.  Behavior: Behavior normal.  Thought Content: Thought content normal.     Labs, Imaging and Diagnostic Testing:  I reviewed notes by Tera Helper I reviewed Ct scan showing large gallstone with some inflammatory changes  Assessment and Plan:  Diagnoses and all orders for this visit:  Calculus of gallbladder with chronic cholecystitis without obstruction  Ischemic cardiomyopathy    We discussed the etiology of gallstones and probably cause pain. We discussed exacerbating factors including fatty meals. We discussed the  details of surgery for removal of the gallbladder including general anesthesia, 4 small incisions in the patient's abdomen, removal of the patient's gallbladder with the liver and common bile duct, and most likely outpatient procedure. We discussed risks of common bile duct injury, cystic duct stump leak, injury to liver, bleeding, infection, need for open procedure, and post cholecystectomy syndrome. The patient showed good understanding and wanted to proceed with laparoscopic cholecystectomy.

## 2023-06-15 ENCOUNTER — Encounter (HOSPITAL_COMMUNITY): Payer: Self-pay | Admitting: General Surgery

## 2023-06-17 LAB — SURGICAL PATHOLOGY

## 2023-07-09 ENCOUNTER — Other Ambulatory Visit: Payer: Self-pay

## 2023-07-09 DIAGNOSIS — E782 Mixed hyperlipidemia: Secondary | ICD-10-CM

## 2023-07-09 DIAGNOSIS — I2510 Atherosclerotic heart disease of native coronary artery without angina pectoris: Secondary | ICD-10-CM

## 2023-07-09 DIAGNOSIS — I255 Ischemic cardiomyopathy: Secondary | ICD-10-CM

## 2023-07-09 MED ORDER — CLOPIDOGREL BISULFATE 75 MG PO TABS: 75.0000 mg | ORAL_TABLET | Freq: Every day | ORAL | 2 refills | Status: DC

## 2023-08-05 ENCOUNTER — Ambulatory Visit: Payer: No Typology Code available for payment source | Attending: Internal Medicine | Admitting: Internal Medicine

## 2023-08-05 ENCOUNTER — Encounter: Payer: Self-pay | Admitting: Internal Medicine

## 2023-08-05 VITALS — BP 96/62 | HR 68 | Ht 72.0 in | Wt 205.0 lb

## 2023-08-05 DIAGNOSIS — E782 Mixed hyperlipidemia: Secondary | ICD-10-CM | POA: Diagnosis not present

## 2023-08-05 DIAGNOSIS — K219 Gastro-esophageal reflux disease without esophagitis: Secondary | ICD-10-CM | POA: Diagnosis not present

## 2023-08-05 DIAGNOSIS — I5022 Chronic systolic (congestive) heart failure: Secondary | ICD-10-CM

## 2023-08-05 DIAGNOSIS — I2510 Atherosclerotic heart disease of native coronary artery without angina pectoris: Secondary | ICD-10-CM

## 2023-08-05 DIAGNOSIS — I255 Ischemic cardiomyopathy: Secondary | ICD-10-CM | POA: Diagnosis not present

## 2023-08-05 DIAGNOSIS — Z9861 Coronary angioplasty status: Secondary | ICD-10-CM

## 2023-08-05 DIAGNOSIS — E785 Hyperlipidemia, unspecified: Secondary | ICD-10-CM

## 2023-08-05 MED ORDER — PANTOPRAZOLE SODIUM 40 MG PO TBEC: 40.0000 mg | DELAYED_RELEASE_TABLET | Freq: Every day | ORAL | 3 refills | Status: DC

## 2023-08-05 MED ORDER — ENTRESTO 24-26 MG PO TABS: 1.0000 | ORAL_TABLET | Freq: Two times a day (BID) | ORAL | 3 refills | Status: DC

## 2023-08-05 NOTE — Progress Notes (Unsigned)
[facility]    OFFICE NOTE  Chief Complaint:  Follow-up heart failure  Primary Care Physician: Joycelyn Rua, MD  HPI:  Alan White is a 62 y.o. male with no history of CAD. CRFs are tobacco, FH. No known history of HTN, HL, DM. He presented 12/28/13 with a 4 day history of intermittent chest pressure. It was left substernal, radiating to both arms. It was 8/10 at its worst. It had occurred at rest and with exertion. He tried OTC PPI and Tums, without relief. One time, he took ASA which helped. It seems to happen more often in the afternoon/evening. It has also woken him from sleep. He had SOB with it it if occurred with exertion, but also describes DOE. He has had no N&V or diaphoresis. He told his wife about it 3 days ago, has had multiple episodes. He took a day off from work and came to the ER because it was scaring him. He was admitted by Dr. Rennis Golden with addition of IV heparin and NTG. Cardiac enzymes were negative- exceptfor 0.11 of troponin marker. Pt underwent cardiac cath with findings of:   1. Severe proximal to mid LAD and mid circumflex stenoses as outlined above.  2. Normal right coronary artery.  3. Wall motion abnormality involving the inferoapical wall. Preserved LVEF at 50-55%  4. Successful circumflex DES implantation post dilated to 3.75 mm in diameter and an aneurysmal portion of the vessel. 0% stenosis was noted post deployment.  5. Successful deployment of overlapping stents in the proximal to mid LAD from 99% to 0% with TIMI grade 3 flow. Postdilatation diameter was 3.0   Dual antiplatelet therapy for greater than 12 months. Brilinta can be switched to Plavix at 6 months.   Pt did well post procedure. EKG with incomplete RBBB, t wave inversions in ant lat leads. Was seen by Dr. Swaziland and found stable for discharge. Will ambulate with cardiac rehab first. Have called [facility] to arrange new pt appointment, they are to call pt.   Other issues borderline diabetes will  have dietician see before discharge. Pt on statin, BB and asa, Brilinta. BP is borderline, ACE will be added as outpatient. No work until  01/04/14.  Mr. Vinzant had been reporting very similar left chest discomfort that he had prior to his original stents. He reported a soreness under the left breast which improved with elevating the left breast. This is very atypical however seem to improve significantly after having his stents placed. He said that he felt great for about 3 months after having stents placed but is since become more short of breath and continues to have chest discomfort. He presented the hospital with similar complaints in early October and underwent a repeat cardiac catheterization which showed widely patent stents. He was discharged without changes in his medication. He comes back today with again similar complaints in the left chest. Unclear whether this is angina or not. He may have had some improvement with a nitrate. Again he is describing some shortness of breath.  Mr. Graham returns today and reports she is feeling the best he has in years. He took his isosorbide for about a week and then had awful headaches and discontinued it. He did notice a change during that week and improvement in his chest pain symptoms, however. He also was switched from Brilinta to Plavix and seems to be tolerating this well. His P2 Y 12 assay indicated 221,which is adequate platelet suppression.  I saw Mr. Raz back in  the office today. Overall he tends to feel fatigued at times. He said initially it felt the best that he had in years after his PCI but now seems to tired fairly easily. He was ordered for sleep study however that has not yet been performed. He also has some significant anxiety and continues to smoke. He's been very difficult for him to quit smoking. I do believe anxiety is playing a role in this. He denies anymore cardiac chest pain.  Mr. Nickson returns today for follow-up. Overall he is  feeling excellent today. He denies any chest pain or shortness of breath. He's been dealing with a kidney stone but he seeing a urologist for this. He has significantly cut back his smoking down to 5 cigarettes a day. He is not started Zyban, but he has filled the prescription and said that he would start the medicine if he cannot stop smoking on his own.  05/29/2018  Mr. Roethler is seen today in follow-up.  He denies any chest pain or worsening shortness of breath.  He recently saw Azalee Course, PA-C in February 2019.  He is complaining of some leg pain and underwent venous Dopplers which were negative for DVT.  He had some associated edema.  That is resolved.  He denies any worsening chest pain.  He has not had any lab work in the past couple of years.  08/20/2019  Mr. Mabry is seen today in follow-up.  I saw him recently for virtual visit.  He had interim decline in LVEF down to 25 to 30% with severe akinesis of the mid apical anteroseptal wall lateral wall and apical septum.  It was felt that there might be some nonischemic and ischemic mixed cardiomyopathy.  I did switch him over to [facility] and he is on spironolactone.  Unfortunately he had hypotension with this and was feeling very fatigued.  His spironolactone was then decreased to 12.5 mg every other day.  After doing this his blood pressure has come up now into the low 100s and he feels much better.  He is currently not working and is out until February on short-term disability.  10/26/2019  Mr. Formby returns today for follow-up.  He underwent a repeat echo which unfortunately shows no significant improvement in LV function with EF 25 to 30%.  He is ready been referred to and seen by Dr. Elberta Fortis in early January for evaluation of AICD.  He agrees that we should proceed with this due to increased risk of sudden cardiac death.  Mr. Cominsky has started doing some more exercise.  He says he does almost on a daily basis with either walking on a treadmill  or riding a stationary bicycle between 15 and 30 minutes.  Overall he says he feels now the best that he has in a while.  Recently was started on a nitro patch which he says is helped significantly as well therefore he may have either had some persistent angina or heart failure symptoms that were improved with this.  He is currently on maximal tolerated goal-directed therapy for congestive heart failure, however due to hypotension with blood pressures typically in the 90s to 100 systolic, I am unable to uptitrate his medicine further.  04/12/2020  Mr. Farve returns today for follow-up.  Overall he says he feels some fatigue.  He is glad however that he went through with the AICD placement.  That was back in March with Dr. Elberta Fortis.  He had no issues with it since then has  done well.  Remote checks have been normal.  Unfortunately his LVEF has not improved significantly.  Blood pressure was unusually high for him today as it typically runs around 100 systolic.  This been little to no room to uptitrate his medications for heart failure.  He reports getting fatigue and seems like he is quite busy.  He works still about 8 to 10 hours a day.  He is very fatigued after this and is being pressured somewhat by his family to consider retiring.  Given his significant cardiomyopathy, he would very likely qualify for disability however he has wanted to work.  Unfortunately since his symptoms are not improving and is likely going to be a long-term issue, I would support him as I feel he would qualify for long-term disability.   11/23/2020  Mr. Upshaw returns today for follow-up of his heart failure.  Recently was randomized into the bat wire trial which is a Baro stem device similar to a pacemaker that stimulates the carotid sinus.  He says he already feels somewhat better after this implantation.  Of course he has an ICD as well.  LVEF has been consistently around 30 to 35% however he notes some improvement in quality of  life.  They have been slowly increasing the output of the device and he will have a repeat echo in May of this year.  Unfortunately, blood pressure would not allow further up titration of his medications  PMHx:  Past Medical History:  Diagnosis Date   AICD (automatic cardioverter/defibrillator) present    [employer]) Gallant ICD   CAD (coronary artery disease), 12/29/13 PCI/DES LCX and PCI/DES LAD with overlapping DES  12/30/2013   cath 07/05/14 [place]   CHF (congestive heart failure) (HCC)    Dyslipidemia, goal LDL below 70 12/30/2013   GERD (gastroesophageal reflux disease)    History of kidney stones    Metabolic syndrome, HgbA1C 6.0  12/30/2013   Myocardial infarction [facility]) 2015,2020   Smoker    Wears partial dentures    upper    Past Surgical History:  Procedure Laterality Date   BAROREFLEX SYSTEM INSERTION Right 08/02/2020   Procedure: BAROREFLEX SYSTEM INSERTION;  Surgeon: Hillis Range, MD;  Location: [facility] INVASIVE CV LAB;  Service: Cardiovascular;  Laterality: Right;   CARDIAC CATHETERIZATION  07/05/14,01/31/19   patent stents   CHOLECYSTECTOMY N/A 06/14/2023   Procedure: LAPAROSCOPIC CHOLECYSTECTOMY;  Surgeon: Kinsinger, De Blanch, MD;  Location: [facility] OR;  Service: General;  Laterality: N/A;   CORONARY ANGIOPLASTY WITH STENT PLACEMENT  12/29/13   DES to LCX and overlapping stents DES mid LAD   CORONARY/GRAFT ACUTE MI REVASCULARIZATION N/A 01/31/2019   Procedure: Coronary/Graft Acute MI Revascularization;  Surgeon: Kathleene Hazel, MD;  Location: [facility] INVASIVE CV LAB;  Service: Cardiovascular;  Laterality: N/A;   ICD IMPLANT N/A 12/17/2019   Procedure: ICD IMPLANT;  Surgeon: Regan Lemming, MD;  Location: [facility] INVASIVE CV LAB;  Service: Cardiovascular;  Laterality: N/A;   LEFT HEART CATH AND CORONARY ANGIOGRAPHY N/A 01/31/2019   Procedure: LEFT HEART CATH AND CORONARY ANGIOGRAPHY;  Surgeon: Kathleene Hazel, MD;  Location: [facility] INVASIVE CV LAB;  Service: Cardiovascular;   Laterality: N/A;   LEFT HEART CATHETERIZATION WITH CORONARY ANGIOGRAM N/A 12/29/2013   Procedure: LEFT HEART CATHETERIZATION WITH CORONARY ANGIOGRAM;  Surgeon: [name], MD;  Location: [facility] CATH LAB;  Service: Cardiovascular;  Laterality: N/A;   LEFT HEART CATHETERIZATION WITH CORONARY ANGIOGRAM N/A 07/05/2014   Procedure: LEFT HEART CATHETERIZATION WITH  CORONARY ANGIOGRAM;  Surgeon: Peter M Swaziland, MD;  Location: [facility] CATH LAB;  Service: Cardiovascular;  Laterality: N/A;   PERCUTANEOUS CORONARY STENT INTERVENTION (PCI-S)  12/29/2013   Procedure: PERCUTANEOUS CORONARY STENT INTERVENTION (PCI-S);  Surgeon: [name], MD;  Location: [facility] CATH LAB;  Service: Cardiovascular;;    FAMHx:  Family History  Problem Relation Age of Onset   Heart attack Brother        Deceased   Heart attack Brother    Stroke Sister    Diabetes Father        also heart disease   Cancer Father        Deceased   Diabetes Mother    Cancer Mother        Deceased    SOCHx:   reports that he has been smoking cigarettes. He has a 25 pack-year smoking history. He has never used smokeless tobacco. He reports current alcohol use. He reports that he does not use drugs.  ALLERGIES:  Allergies  Allergen Reactions   Zetia [Ezetimibe] Shortness Of Breath    Throat swelling   Levaquin [Levofloxacin] Other (See Comments)    Muscle cramps    ROS: A comprehensive review of systems was negative.  HOME MEDS: Current Outpatient Medications  Medication Sig Dispense Refill   aspirin EC 81 MG tablet Take 1 tablet (81 mg total) by mouth daily. 30 tablet 11   atorvastatin (LIPITOR) 80 MG tablet Take 1 tablet (80 mg total) by mouth daily. 90 tablet 2   clopidogrel (PLAVIX) 75 MG tablet Take 1 tablet (75 mg total) by mouth daily. 90 tablet 2   famotidine (PEPCID) 40 MG tablet Take 40 mg by mouth daily in the afternoon.     isosorbide mononitrate (IMDUR) 30 MG 24 hr tablet Take 1 tablet (30 mg total) by mouth daily. 90  tablet 3   metoprolol succinate (TOPROL XL) 25 MG 24 hr tablet Take 0.5 tablets (12.5 mg total) by mouth daily. 90 tablet 3   nitroGLYCERIN (NITROSTAT) 0.4 MG SL tablet Place 1 tablet (0.4 mg total) under the tongue every 5 (five) minutes x 3 doses as needed for chest pain. 25 tablet 12   sacubitril-valsartan (ENTRESTO) 24-26 MG Take 0.5 tablets by mouth 2 (two) times daily. 90 tablet 3   spironolactone (ALDACTONE) 25 MG tablet Take 0.5 tablets (12.5 mg total) by mouth daily. 45 tablet 4   doxycycline (VIBRA-TABS) 100 MG tablet Take 100 mg by mouth 2 (two) times daily. (Patient not taking: Reported on 08/05/2023)     oxyCODONE (OXY IR/ROXICODONE) 5 MG immediate release tablet Take 1 tablet (5 mg total) by mouth every 6 (six) hours as needed for up to 30 doses for severe pain. 1-2 Tabs PO q6h PRN pain (Patient not taking: Reported on 08/05/2023)     No current facility-administered medications for this visit.    LABS/IMAGING: No results found for this or any previous visit (from the past 48 hour(s)). No results found.  VITALS: BP 96/62   Pulse 68   Ht 6' (1.829 m)   Wt 205 lb (93 kg)   SpO2 99%   BMI 27.80 kg/m   EXAM: General appearance: alert and no distress Neck: no carotid bruit, no JVD and thyroid not enlarged, symmetric, no tenderness/mass/nodules Lungs: clear to auscultation bilaterally Heart: regular rate and rhythm, S1, S2 normal, no murmur, click, rub or gallop Abdomen: soft, non-tender; bowel sounds normal; no masses,  no organomegaly Extremities: extremities normal, atraumatic, no  cyanosis or edema Pulses: 2+ and symmetric Skin: Skin color, texture, turgor normal. No rashes or lesions Neurologic: Grossly normal Psych: Pleasant  EKG: Deferred  ASSESSMENT: Coronary artery disease status post two-vessel PCI to the left circumflex and LAD (2015) Dyslipidemia on statin Tobacco abuse - working on quitting Anxiety Mixed ischemic and nonischemic cardiomyopathy EF 25 to  30%, NYHA class II-III symptoms Status post AICD [facility], [facility]) - 11/2019 status post Batwire (Barostim device)  PLAN: 1.   Mr. Caravello is to have had some symptomatic improvement after placement of his [place] stim device.  Hopefully with increasing outputs this will combat some of his heart failure symptoms.  He has a repeat echo scheduled in May.  We will plan follow-up after that time. Unfortunately, BP will not allow uptitration of medication at this point.  Follow-up with me in 6 months.  Chrystie Nose, MD, [facility], FACP  [place]  [facility]  Medical Director of the Advanced Lipid Disorders &  Cardiovascular Risk Reduction Clinic Diplomate of the American Board of Clinical Lipidology Attending Cardiologist  Direct Dial: [phone]  Fax: [phone]  Website:  [email or web address] Nicely Frankie Zito 08/05/2023, 9:51 AM

## 2023-08-05 NOTE — Patient Instructions (Addendum)
Medication Instructions:   STOP spironolactone INCREASE entresto to whole tablet (24/26mg ) twice daily TAKE metoprolol succinate and atorvastatin at nighttime START pantoprazole 40mg  once daily  *If you need a refill on your cardiac medications before your next appointment, please call your pharmacy*   Lab Work: Lipid Panel today   If you have labs (blood work) drawn today and your tests are completely normal, you will receive your results only by: MyChart Message (if you have MyChart) OR A paper copy in the mail If you have any lab test that is abnormal or we need to change your treatment, we will call you to review the results.   Follow-Up: At [facility], you and your health needs are our priority.  As part of our continuing mission to provide you with exceptional heart care, we have created designated Provider Care Teams.  These Care Teams include your primary Cardiologist (physician) and Advanced Practice Providers (APPs -  Physician Assistants and Nurse Practitioners) who all work together to provide you with the care you need, when you need it.  We recommend signing up for the patient portal called "MyChart".  Sign up information is provided on this After Visit Summary.  MyChart is used to connect with patients for Virtual Visits (Telemedicine).  Patients are able to view lab/test results, encounter notes, upcoming appointments, etc.  Non-urgent messages can be sent to your provider as well.   To learn more about what you can do with MyChart, go to [email or web address].    Your next appointment:   2-3 months with Dr. Rennis Golden, Juanda Crumble PA or [place]

## 2023-08-06 LAB — LIPID PANEL
Chol/HDL Ratio: 4 ratio (ref 0.0–5.0)
Cholesterol, Total: 131 mg/dL (ref 100–199)
HDL: 33 mg/dL — ABNORMAL LOW (ref >39)
LDL Chol Calc (NIH): 78 mg/dL (ref 0–99)
Triglycerides: 105 mg/dL (ref 0–149)
VLDL Cholesterol Cal: 20 mg/dL (ref 5–40)

## 2023-10-04 ENCOUNTER — Ambulatory Visit: Payer: No Typology Code available for payment source | Admitting: Physician Assistant

## 2023-10-18 NOTE — Progress Notes (Unsigned)
Cardiology Office Note:    Date:  10/24/2023   ID:  Alan White, DOB [birth year], MRN [id number]  PCP:  Joycelyn Rua, MD   [facility] Providers Cardiologist:  Chrystie Nose, MD Cardiology APP:  Marcelino Duster, [place]  Electrophysiologist:  Regan Lemming, MD     Referring MD: Joycelyn Rua, MD   Chief Complaint  Patient presents with   Shortness of Breath    Patient stated that he has SOB at times he smokes about 10 cigarettes a day    History of Present Illness:    Alan White is a 63 y.o. male with a hx of chronic systolic heart failure s/p ICD, baroreflex 2021, CAD with DES-LCx and LAD in 2015, angioplasty to LAD in 2020, DM2, hyperlipidemia, OSA, and tobacco use.   Heart catheterization 12/29/2013 showed EF 50-55%, 85-90% mid LCx treated with 3.5 x 12 mm DES and 90% proximal to mid LAD stenosis treated with 3.0 x 16 mm DES overlapped with a 2.75 x 16 mm DES.  Echocardiogram 12/29/2013 showed EF 45-50% with mid to distal anterior apical and inferior apical hypokinesis, mild LVH.  He suffered anterior lateral STEMI 01/31/2019 after stopping all of his cardiac medications.  Emergent heart catheterization showed 100% proximal LAD occlusion treated with balloon angioplasty.  Echocardiogram the following day showed LVEF 35-40% with apical ballooning consistent with Takotsubo cardiomyopathy and no significant valvular disease.  ICD placed 12/17/2019.  Repeat echocardiogram 07/2020 showed continued EF 30 to 35%.  Barostim device implanted 08/2020.  Most recent echocardiogram 08/2021 showed an LVEF 40-45%.  He was getting care at the [facility] but has recently reestablished care with Dr. Rennis Golden at [facility].  Due to borderline blood pressure, carvedilol was switched to Toprol 12.5 mg daily.  He was treated with Entresto 24-26 mg twice daily-he may be breaking these in half though.  He is treated with DAPT with aspirin and Plavix, 80 mg Lipitor, 30 mg Imdur, 12.5  mg Toprol and 40 mg Protonix for GERD like symptoms at last visit.  He presents today for 2 to 31-month follow-up. He did not tolerate taking medications at night, doing better taking everything in the morning, is taking a whole tablet of entresto BID.  He is concerned about dyspnea on exertion that is not consistent.  For example, he climbs the stairs to the basement and is usually fine, other times can have some shortness of breath at the top.  He continues to smoke 10 cigarettes a day.  I suspect this is due to deconditioning.  He expresses significant interest in restarting cardiac rehab and I will submit this referral.  We also discussed Silver sneakers and [employer] since he lives in [place].   Past Medical History:  Diagnosis Date   AICD (automatic cardioverter/defibrillator) present    [facility]) Gallant ICD   CAD (coronary artery disease), 12/29/13 PCI/DES LCX and PCI/DES LAD with overlapping DES  12/30/2013   cath 07/05/14 [place]   CHF (congestive heart failure) (HCC)    Dyslipidemia, goal LDL below 70 12/30/2013   GERD (gastroesophageal reflux disease)    History of kidney stones    Metabolic syndrome, HgbA1C 6.0  12/30/2013   Myocardial infarction [facility]) 2015,2020   Smoker    Wears partial dentures    upper    Past Surgical History:  Procedure Laterality Date   BAROREFLEX SYSTEM INSERTION Right 08/02/2020   Procedure: BAROREFLEX SYSTEM INSERTION;  Surgeon: Hillis Range, MD;  Location: [facility]  INVASIVE CV LAB;  Service: Cardiovascular;  Laterality: Right;   CARDIAC CATHETERIZATION  07/05/14,01/31/19   patent stents   CHOLECYSTECTOMY N/A 06/14/2023   Procedure: LAPAROSCOPIC CHOLECYSTECTOMY;  Surgeon: Kinsinger, De Blanch, MD;  Location: [facility] OR;  Service: General;  Laterality: N/A;   CORONARY ANGIOPLASTY WITH STENT PLACEMENT  12/29/13   DES to LCX and overlapping stents DES mid LAD   CORONARY/GRAFT ACUTE MI REVASCULARIZATION N/A 01/31/2019   Procedure: Coronary/Graft Acute MI  Revascularization;  Surgeon: Kathleene Hazel, MD;  Location: [facility] INVASIVE CV LAB;  Service: Cardiovascular;  Laterality: N/A;   ICD IMPLANT N/A 12/17/2019   Procedure: ICD IMPLANT;  Surgeon: Regan Lemming, MD;  Location: [facility] INVASIVE CV LAB;  Service: Cardiovascular;  Laterality: N/A;   LEFT HEART CATH AND CORONARY ANGIOGRAPHY N/A 01/31/2019   Procedure: LEFT HEART CATH AND CORONARY ANGIOGRAPHY;  Surgeon: Kathleene Hazel, MD;  Location: [facility] INVASIVE CV LAB;  Service: Cardiovascular;  Laterality: N/A;   LEFT HEART CATHETERIZATION WITH CORONARY ANGIOGRAM N/A 12/29/2013   Procedure: LEFT HEART CATHETERIZATION WITH CORONARY ANGIOGRAM;  Surgeon: [name], MD;  Location: [facility] CATH LAB;  Service: Cardiovascular;  Laterality: N/A;   LEFT HEART CATHETERIZATION WITH CORONARY ANGIOGRAM N/A 07/05/2014   Procedure: LEFT HEART CATHETERIZATION WITH CORONARY ANGIOGRAM;  Surgeon: Peter M Swaziland, MD;  Location: [facility] CATH LAB;  Service: Cardiovascular;  Laterality: N/A;   PERCUTANEOUS CORONARY STENT INTERVENTION (PCI-S)  12/29/2013   Procedure: PERCUTANEOUS CORONARY STENT INTERVENTION (PCI-S);  Surgeon: [name], MD;  Location: [facility] CATH LAB;  Service: Cardiovascular;;    Current Medications: Current Meds  Medication Sig   aspirin EC 81 MG tablet Take 1 tablet (81 mg total) by mouth daily.   atorvastatin (LIPITOR) 80 MG tablet Take 1 tablet (80 mg total) by mouth daily.   clopidogrel (PLAVIX) 75 MG tablet Take 1 tablet (75 mg total) by mouth daily.   isosorbide mononitrate (IMDUR) 30 MG 24 hr tablet Take 1 tablet (30 mg total) by mouth daily.   metoprolol succinate (TOPROL XL) 25 MG 24 hr tablet Take 0.5 tablets (12.5 mg total) by mouth daily.   nitroGLYCERIN (NITROSTAT) 0.4 MG SL tablet Place 1 tablet (0.4 mg total) under the tongue every 5 (five) minutes x 3 doses as needed for chest pain.   pantoprazole (PROTONIX) 40 MG tablet Take 1 tablet (40 mg total) by mouth daily.    sacubitril-valsartan (ENTRESTO) 24-26 MG Take 1 tablet by mouth 2 (two) times daily.     Allergies:   Zetia [ezetimibe] and Levaquin [levofloxacin]   Social History   Socioeconomic History   Marital status: Married    Spouse name:     Number of children: Not on file   Years of education: Not on file   Highest education level: Not on file  Occupational History   Occupation: Event organiser: [employer]  Tobacco Use   Smoking status: Every Day    Current packs/day: 0.50    Average packs/day: 0.5 packs/day for 50.0 years (25.0 ttl pk-yrs)    Types: Cigarettes   Smokeless tobacco: Never  Vaping Use   Vaping status: Never Used  Substance and Sexual Activity   Alcohol use: Yes    Comment: ocassional- rare   Drug use: No   Sexual activity: Yes    Partners: Female    Comment: married  Other Topics Concern   Not on file  Social History Narrative   Pt lives  with wife.   Social Drivers of Corporate investment banker Strain: Not on file  Food Insecurity: Not on file  Transportation Needs: Not on file  Physical Activity: Not on file  Stress: Not on file  Social Connections: Not on file     Family History: The patient's family history includes Cancer in his father and mother; Diabetes in his father and mother; Heart attack in his brother and brother; Stroke in his sister.  ROS:   Please see the history of present illness.     All other systems reviewed and are negative.  EKGs/Labs/Other Studies Reviewed:    The following studies were reviewed today:  Cardiac Studies & Procedures   CARDIAC CATHETERIZATION  CARDIAC CATHETERIZATION 01/31/2019  Narrative  Prox RCA lesion is 20% stenosed.  Previously placed Prox Cx to Mid Cx stent (unknown type) is widely patent.  Ost LAD to Prox LAD lesion is 40% stenosed.  Prox LAD lesion is 100% stenosed.  Balloon angioplasty was performed using a BALLOON [place]antis EMERGE MR 3.25X20.  Post intervention, there is a 0%  residual stenosis.  1. Acute anterior STEMI secondary to thrombotic occlusion of the stented segment of the mid LAD 2. Successful PTCA with balloon angioplasty only of the mid LAD stented segment with excellent angiographic result 3. Patent stent mid Circumflex 4. Mild non-obstructive disease in the proximal LAD and proximal RCA 5. Elevated LVEDP  Recommendations: Will admit to ICU. Will continue DAPT with ASA and Brilinta for one year followed by DAPT with ASA and Plavix for lifetime. Continue statin and beta blocker. Will continue Aggrastat for 18 hours post PCI given large thrombus burden. Echo in am. Likely d/c home Monday if stable.  Findings Coronary Findings Diagnostic  Dominance: Right  Left Anterior Descending Vessel is large. Ost LAD to Prox LAD lesion is 40% stenosed. Prox LAD lesion is 100% stenosed. The lesion is heavily thrombotic. The lesion was previously treated using a drug eluting stent over 2 years ago.  Left Circumflex Vessel is large. Previously placed Prox Cx to Mid Cx stent (unknown type) is widely patent.  Third Obtuse Marginal Branch Vessel is large in size.  Right Coronary Artery Vessel is large. Prox RCA lesion is 20% stenosed.  Intervention  Prox LAD lesion Angioplasty CATH VISTA GUIDE 6FR XBLAD3.0 guide catheter was inserted. WIRE COUGAR XT STRL 190CM guidewire used to cross lesion. Balloon angioplasty was performed using a BALLOON Mullan EMERGE MR 3.25X20. Maximum pressure: 16 atm. Inflation time: 10 sec. Balloon angioplasty only of the prior stented segment in the mid LAD. Post-Intervention Lesion Assessment The intervention was successful. Pre-interventional TIMI flow is 0. Post-intervention TIMI flow is 3. No complications occurred at this lesion. There is a 0% residual stenosis post intervention.   STRESS TESTS  MYOCARDIAL PERFUSION IMAGING 09/12/2016  Narrative  The left ventricular ejection fraction is normal (55-65%).  Nuclear stress  EF: 61%.  Findings consistent with ischemia.  This is a low risk study.  Horizontal ST segment depression ST segment depression of 1 mm was noted during stress in the III, aVF, II, V4, V5 and V6 leads.  Positive ECG with exercise Mild inferior wall ischemia at mid and basal level EF 61%  ECHOCARDIOGRAM  ECHOCARDIOGRAM COMPLETE 08/08/2021  Narrative ECHOCARDIOGRAM REPORT    Patient Name:   Alan White Date of Exam: 08/08/2021 Medical Rec #:  [id number]      Height:       72.0 in Accession #:    [id number]  Weight:       208.0 lb Date of Birth:  1961/04/14     BSA:          2.166 m Patient Age:    59 years       BP:           115/84 mmHg Patient Gender: M              HR:           79 bpm. Exam Location:  Outpatient  Procedure: 2D Echo, Cardiac Doppler and Color Doppler  Indications:     [employer]  History:         Patient has prior history of Echocardiogram examinations, most recent 02/19/2021. CHF, Previous Myocardial Infarction and CAD, Defibrillator; Risk Factors:Dyslipidemia, Sleep Apnea and Former Smoker. GERD.  Sonographer:     Roosvelt Maser RDCS Referring Phys:  [id number] Nada Libman Diagnosing Phys: Yates Decamp MD  IMPRESSIONS   1. Cannot exclude apical and mid to distal anterior hypokinesis. Diasotlic function cannot be calculated due to paced rhythm although appears to be normal. Left ventricular ejection fraction, by estimation, is 40 to 45%. The left ventricle has mildly decreased function. The left ventricle demonstrates global hypokinesis. The left ventricular internal cavity size was mildly dilated. Indeterminate diastolic filling due to E-A fusion. 2. Right ventricular systolic function is normal. The right ventricular size is normal. 3. Left atrial size was mildly dilated. 4. The mitral valve is normal in structure. Trivial mitral valve regurgitation. No evidence of mitral stenosis. 5. The aortic valve is tricuspid. Aortic valve regurgitation is  not visualized. Mild aortic valve sclerosis is present, with no evidence of aortic valve stenosis. 6. The inferior vena cava is normal in size with greater than 50% respiratory variability, suggesting right atrial pressure of 3 mmHg.  Comparison(s): No significant change from prior study. 02/14/2021.  FINDINGS Left Ventricle: Cannot exclude apical and mid to distal anterior hypokinesis. Diasotlic function cannot be calculated due to paced rhythm although appears to be normal. Left ventricular ejection fraction, by estimation, is 40 to 45%. The left ventricle has mildly decreased function. The left ventricle demonstrates global hypokinesis. The left ventricular internal cavity size was mildly dilated. There is no left ventricular hypertrophy. Indeterminate diastolic filling due to E-A fusion.  Right Ventricle: The right ventricular size is normal. No increase in right ventricular wall thickness. Right ventricular systolic function is normal.  Left Atrium: Left atrial size was mildly dilated.  Right Atrium: Right atrial size was normal in size.  Pericardium: There is no evidence of pericardial effusion.  Mitral Valve: The mitral valve is normal in structure. Trivial mitral valve regurgitation. No evidence of mitral valve stenosis.  Tricuspid Valve: The tricuspid valve is normal in structure. Tricuspid valve regurgitation is trivial. No evidence of tricuspid stenosis.  Aortic Valve: The aortic valve is tricuspid. Aortic valve regurgitation is not visualized. Mild aortic valve sclerosis is present, with no evidence of aortic valve stenosis. Aortic valve mean gradient measures 2.0 mmHg. Aortic valve peak gradient measures 4.4 mmHg. Aortic valve area, by VTI measures 3.40 cm.  Pulmonic Valve: The pulmonic valve was normal in structure. Pulmonic valve regurgitation is not visualized. No evidence of pulmonic stenosis.  Aorta: The aortic root is normal in size and structure.  Venous: The inferior  vena cava is normal in size with greater than 50% respiratory variability, suggesting right atrial pressure of 3 mmHg.  IAS/Shunts: No atrial level shunt detected by color  flow Doppler.  Additional Comments: A device lead is visualized.   LEFT VENTRICLE PLAX 2D LVIDd:         5.60 cm      Diastology LVIDs:         4.70 cm      LV e' medial:    8.05 cm/s LV PW:         0.90 cm      LV E/e' medial:  8.1 LV IVS:        1.00 cm      LV e' lateral:   7.83 cm/s LVOT diam:     2.00 cm      LV E/e' lateral: 8.4 LV SV:         71 LV SV Index:   33 LVOT Area:     3.14 cm  LV Volumes (MOD) LV vol d, MOD A2C: 138.0 ml LV vol d, MOD A4C: 137.0 ml LV vol s, MOD A2C: 59.5 ml LV vol s, MOD A4C: 76.7 ml LV SV MOD A2C:     78.5 ml LV SV MOD A4C:     137.0 ml LV SV MOD BP:      70.1 ml  RIGHT VENTRICLE RV Basal diam:  3.40 cm  LEFT ATRIUM             Index        RIGHT ATRIUM           Index LA diam:        2.80 cm 1.29 cm/m   RA Area:     18.90 cm LA Vol (A2C):   60.7 ml 28.02 ml/m  RA Volume:   57.00 ml  26.31 ml/m LA Vol (A4C):   65.1 ml 30.05 ml/m LA Biplane Vol: 64.5 ml 29.77 ml/m AORTIC VALVE AV Area (Vmax):    3.26 cm AV Area (Vmean):   3.22 cm AV Area (VTI):     3.40 cm AV Vmax:           105.00 cm/s AV Vmean:          72.000 cm/s AV VTI:            0.209 m AV Peak Grad:      4.4 mmHg AV Mean Grad:      2.0 mmHg LVOT Vmax:         109.00 cm/s LVOT Vmean:        73.700 cm/s LVOT VTI:          0.226 m LVOT/AV VTI ratio: 1.08  AORTA Ao Root diam: 3.10 cm  MITRAL VALVE MV Area (PHT): 3.93 cm    SHUNTS MV Decel Time: 193 msec    Systemic VTI:  0.23 m MV E velocity: 65.60 cm/s  Systemic Diam: 2.00 cm MV A velocity: 69.40 cm/s MV E/A ratio:  0.95  Yates Decamp MD Electronically signed by Yates Decamp MD Signature Date/Time: 08/09/2021/8:37:37 PM    Final                   Recent Labs: 06/07/2023: BUN 10; Creatinine, Ser 1.24; Hemoglobin 14.2; Platelets 211;  Potassium 3.6; Sodium 141  Recent Lipid Panel    Component Value Date/Time   CHOL 131 08/05/2023 1029   TRIG 105 08/05/2023 1029   HDL 33 (L) 08/05/2023 1029   CHOLHDL 4.0 08/05/2023 1029   CHOLHDL 4.3 08/10/2014 0810   VLDL 27 08/10/2014 0810   LDLCALC 78 08/05/2023 1029  Risk Assessment/Calculations:                Physical Exam:    VS:  BP 102/64   Pulse 76   Ht 6' (1.829 m)   Wt 209 lb (94.8 kg)   SpO2 95%   BMI 28.35 kg/m     Wt Readings from Last 3 Encounters:  10/24/23 209 lb (94.8 kg)  08/05/23 205 lb (93 kg)  06/14/23 206 lb (93.4 kg)     GEN:  Well nourished, well developed in no acute distress HEENT: Normal NECK: No JVD; No carotid bruits LYMPHATICS: No lymphadenopathy CARDIAC: RRR, no murmurs, rubs, gallops RESPIRATORY:  Clear to auscultation without rales, wheezing or rhonchi  ABDOMEN: Soft, non-tender, non-distended MUSCULOSKELETAL:  No edema; No deformity  SKIN: Warm and dry NEUROLOGIC:  Alert and oriented x 3 PSYCHIATRIC:  Normal affect   ASSESSMENT:    1. Ischemic cardiomyopathy   2. Hyperlipidemia LDL goal <70   3. Coronary artery disease involving native coronary artery of native heart without angina pectoris   4. CAD S/P percutaneous coronary angioplasty   5. Chronic systolic heart failure (HCC)   6. ICD (implantable cardioverter-defibrillator) in place   7. Tobacco use   8. Hyperlipidemia with target LDL less than 70   9. Primary hypertension   10. Preoperative cardiovascular examination    PLAN:    In order of problems listed above:  Chronic systolic heart failure ICD in place Barostim in place 2021 -LVEF 40-45% in 2022 after barostim -GDMT is now complicated by borderline blood pressure - now taking 24-26 mg entresto BID, 12.5 mg Toprol -He feels like he is in a slump and does better when exercising 3 times a week.  He wants another referral to cardiac rehab and I think this is a good idea.  We also discussed what  resources he may qualify for through the [place], Silver sneakers, and [employer].   CAD ISR of 100% in the mid LAD treated with balloon angioplasty 01/2019 in the setting of STEMI, patent LCx stent -Remains on DAPT with aspirin and Plavix, 80 mg Lipitor   Hyperlipidemia with LDL goal less than 70 08/05/2023: Cholesterol, Total 131; HDL 33; LDL Chol Calc (NIH) 78; Triglycerides 105 - 80 mg lipitor-consider obtaining LPA with next blood work and consideration of PCSK9 inhibitor given history of ISR - will attempt a lower LDL at 55 given smoking history   Preoperative risk evaluation prior to dental work Getting 7 teeth pulled. He can complete more than 4.0 METS without angina.  He has a history of in-stent restenosis in the mid LAD.  He has a 6.6% risk of Mace; however, he informs me that he will not have anesthesia with the dental procedure.  He may hold Plavix for 5 to 7 days, but should continue aspirin uninterrupted.  He understands his risk and wishes to proceed.  Follow-up in 6 months.    Medication Adjustments/Labs and Tests Ordered: Current medicines are reviewed at length with the patient today.  Concerns regarding medicines are outlined above.  Orders Placed This Encounter  Procedures   AMB Referral to [facility] Pharm[facility]   No orders of the defined types were placed in this encounter.   Patient Instructions  Medication Instructions:  Your physician recommends that you continue on your current medications as directed. Please refer to the Current Medication list given to you today.  *If you need a refill on your cardiac medications before your next appointment, please call  your pharmacy*    Follow-Up: At [facility], you and your health needs are our priority.  As part of our continuing mission to provide you with exceptional heart care, we have created designated Provider Care Teams.  These Care Teams include your primary Cardiologist (physician) and Advanced Practice  Providers (APPs -  Physician Assistants and Nurse Practitioners) who all work together to provide you with the care you need, when you need it.  We recommend signing up for the patient portal called "MyChart".  Sign up information is provided on this After Visit Summary.  MyChart is used to connect with patients for Virtual Visits (Telemedicine).  Patients are able to view lab/test results, encounter notes, upcoming appointments, etc.  Non-urgent messages can be sent to your provider as well.   To learn more about what you can do with MyChart, go to [email or web address].    Your next appointment:   6 month(s)  Provider:   Chrystie Nose, MD     Other Instructions We will schedule you a visit with one of our PharmDs to discuss cholesterol therapy.  Orders for cardiac rehab placed.         Signed, Marcelino Duster, [place]  10/24/2023 9:52 AM    [facility]

## 2023-10-24 ENCOUNTER — Encounter: Payer: Self-pay | Admitting: Physician Assistant

## 2023-10-24 ENCOUNTER — Ambulatory Visit: Payer: No Typology Code available for payment source | Attending: Physician Assistant | Admitting: Physician Assistant

## 2023-10-24 VITALS — BP 102/64 | HR 76 | Ht 72.0 in | Wt 209.0 lb

## 2023-10-24 DIAGNOSIS — I2510 Atherosclerotic heart disease of native coronary artery without angina pectoris: Secondary | ICD-10-CM | POA: Diagnosis not present

## 2023-10-24 DIAGNOSIS — Z9861 Coronary angioplasty status: Secondary | ICD-10-CM

## 2023-10-24 DIAGNOSIS — Z720 Tobacco use: Secondary | ICD-10-CM

## 2023-10-24 DIAGNOSIS — I10 Essential (primary) hypertension: Secondary | ICD-10-CM

## 2023-10-24 DIAGNOSIS — Z01810 Encounter for preprocedural cardiovascular examination: Secondary | ICD-10-CM

## 2023-10-24 DIAGNOSIS — I255 Ischemic cardiomyopathy: Secondary | ICD-10-CM | POA: Diagnosis not present

## 2023-10-24 DIAGNOSIS — I5022 Chronic systolic (congestive) heart failure: Secondary | ICD-10-CM

## 2023-10-24 DIAGNOSIS — Z95810 Presence of automatic (implantable) cardiac defibrillator: Secondary | ICD-10-CM

## 2023-10-24 DIAGNOSIS — E785 Hyperlipidemia, unspecified: Secondary | ICD-10-CM

## 2023-10-24 NOTE — Patient Instructions (Addendum)
Medication Instructions:  Your physician recommends that you continue on your current medications as directed. Please refer to the Current Medication list given to you today.  *If you need a refill on your cardiac medications before your next appointment, please call your pharmacy*    Follow-Up: At [facility], you and your health needs are our priority.  As part of our continuing mission to provide you with exceptional heart care, we have created designated Provider Care Teams.  These Care Teams include your primary Cardiologist (physician) and Advanced Practice Providers (APPs -  Physician Assistants and Nurse Practitioners) who all work together to provide you with the care you need, when you need it.  We recommend signing up for the patient portal called "MyChart".  Sign up information is provided on this After Visit Summary.  MyChart is used to connect with patients for Virtual Visits (Telemedicine).  Patients are able to view lab/test results, encounter notes, upcoming appointments, etc.  Non-urgent messages can be sent to your provider as well.   To learn more about what you can do with MyChart, go to [email or web address].    Your next appointment:   6 month(s)  Provider:   Chrystie Nose, MD     Other Instructions We will schedule you a visit with one of our PharmDs to discuss cholesterol therapy.  Orders for cardiac rehab placed.

## 2023-10-25 ENCOUNTER — Telehealth (HOSPITAL_COMMUNITY): Payer: Self-pay

## 2023-10-25 NOTE — Telephone Encounter (Signed)
Pt insurance is active and benefits verified through [employer]. Co-pay $0.00, DED $0.00/$0.00 met, out of pocket $3,500.00/$0.00 met, co-insurance 0%. No pre-authorization required. Shubhneet/[employer]A, 10/25/23 @ 3:08PM, [id number]   How many CR sessions are covered? (36 visits for TCR, 72 visits for ICR)72 Is this a lifetime maximum or an annual maximum? Annual Has the member used any of these services to date? No Is there a time limit (weeks/months) on start of program and/or program completion? No

## 2023-10-28 ENCOUNTER — Telehealth (HOSPITAL_COMMUNITY): Payer: Self-pay | Admitting: *Deleted

## 2023-10-28 ENCOUNTER — Other Ambulatory Visit: Payer: Self-pay | Admitting: Internal Medicine

## 2023-10-28 DIAGNOSIS — I5022 Chronic systolic (congestive) heart failure: Secondary | ICD-10-CM

## 2023-10-28 NOTE — Telephone Encounter (Signed)
Received referral from Dr. Rennis Golden for this pt to participate in Cardiac rehab with the diagnosis of Chronic Systolic Heart Failure.  Reviewed medical record.  Pt most recent echo shows improved EF 40-45%.  EF too high for Medicare Advantage plans (pt has healthteam advantage to reimburse.  Called and spoke with pt regarding options.  Pt who is known to rehab staff from previous participation, prefers not to use the [place] for any authorization for any care in the community services. Agreeable to the option for pulmonary rehab.  Feels that 2 days a week will help "jump start" his exercise routine. Alan White will contact Dr. Rennis Golden for referral for pulmonary rehab if agreeable.  Verbalized understanding. Alan White, BSN Cardiac and Emergency planning/management officer

## 2023-10-30 ENCOUNTER — Telehealth (HOSPITAL_COMMUNITY): Payer: Self-pay | Admitting: *Deleted

## 2023-10-30 NOTE — Telephone Encounter (Signed)
Called patient to see if he was interested in participating in the Pulmonary Rehab Program. Patient stated yes. Patient will come in for orientation on 11/06/23 at 10:30 and will attend the 10:15 exercise class starting 11/12/23.  Will send pt information in MyChart. Ethelda Chick BS, ACSM-CEP 10/30/2023 3:08 PM

## 2023-11-01 ENCOUNTER — Telehealth (HOSPITAL_COMMUNITY): Payer: Self-pay

## 2023-11-01 NOTE — Telephone Encounter (Signed)
Pt insurance is active and benefits verified through [employer]. Co-pay $0.00, DED $0.00/$0.00 met, out of pocket $3,500.00/$0.00 met, co-insurance 0%. No pre-authorization required. Shubham/HTA, 11/01/23 @ 4PM, ZOX#[id number]

## 2023-11-06 ENCOUNTER — Encounter (HOSPITAL_COMMUNITY): Payer: Self-pay

## 2023-11-06 ENCOUNTER — Encounter (HOSPITAL_COMMUNITY)
Admission: RE | Admit: 2023-11-06 | Discharge: 2023-11-06 | Disposition: A | Discharge status: Home / Self Care | Payer: No Typology Code available for payment source | Source: Ambulatory Visit | Attending: Internal Medicine | Admitting: Internal Medicine

## 2023-11-06 VITALS — BP 120/60 | HR 81 | Ht 72.0 in | Wt 211.4 lb

## 2023-11-06 DIAGNOSIS — I255 Ischemic cardiomyopathy: Secondary | ICD-10-CM | POA: Insufficient documentation

## 2023-11-06 DIAGNOSIS — I5022 Chronic systolic (congestive) heart failure: Secondary | ICD-10-CM | POA: Diagnosis present

## 2023-11-06 NOTE — Progress Notes (Signed)
 Pulmonary Individual Treatment Plan  Patient Details  Name: Alan White MRN: [id number] Date of Birth: Jul 25, 1961 Referring Provider:   Conrad Ports Pulmonary Rehab Walk Test from 11/06/2023 in [facility] for Heart, Vascular, & Lung Health  Referring Provider Hilty       Initial Encounter Date:  Flowsheet Row Pulmonary Rehab Walk Test from 11/06/2023 in [facility] for Heart, Vascular, & Lung Health  Date 11/06/23       Visit Diagnosis: Heart failure, chronic systolic (HCC)  Patient's Home Medications on Admission:   Current Outpatient Medications:    aspirin  EC 81 MG tablet, Take 1 tablet (81 mg total) by mouth daily., Disp: 30 tablet, Rfl: 11   atorvastatin  (LIPITOR) 80 MG tablet, Take 1 tablet (80 mg total) by mouth daily., Disp: 90 tablet, Rfl: 2   clopidogrel  (PLAVIX ) 75 MG tablet, Take 1 tablet (75 mg total) by mouth daily., Disp: 90 tablet, Rfl: 2   isosorbide  mononitrate (IMDUR ) 30 MG 24 hr tablet, Take 1 tablet (30 mg total) by mouth daily., Disp: 90 tablet, Rfl: 3   metoprolol  succinate (TOPROL  XL) 25 MG 24 hr tablet, Take 0.5 tablets (12.5 mg total) by mouth daily., Disp: 90 tablet, Rfl: 3   nitroGLYCERIN  (NITROSTAT ) 0.4 MG SL tablet, Place 1 tablet (0.4 mg total) under the tongue every 5 (five) minutes x 3 doses as needed for chest pain., Disp: 25 tablet, Rfl: 12   pantoprazole  (PROTONIX ) 40 MG tablet, Take 1 tablet (40 mg total) by mouth daily., Disp: 90 tablet, Rfl: 3   sacubitril -valsartan  (ENTRESTO ) 24-26 MG, Take 1 tablet by mouth 2 (two) times daily., Disp: 180 tablet, Rfl: 3  Past Medical History: Past Medical History:  Diagnosis Date   AICD (automatic cardioverter/defibrillator) present    [employer]) [employer] ICD   CAD (coronary artery disease), 12/29/13 PCI/DES LCX and PCI/DES LAD with overlapping DES  12/30/2013   cath 07/05/14 OK   CHF (congestive heart failure) (HCC)    Dyslipidemia, goal LDL below 70  12/30/2013   GERD (gastroesophageal reflux disease)    History of kidney stones    Metabolic syndrome, HgbA1C 6.0  12/30/2013   Myocardial infarction [facility]) 2015,2020   Smoker    Wears partial dentures    upper    Tobacco Use: Social History   Tobacco Use  Smoking Status Former   Current packs/day: 0.00   Average packs/day: 0.5 packs/day for 50.0 years (25.0 ttl pk-yrs)   Types: Cigarettes   Quit date: 10/14/2023   Years since quitting: 0.0  Smokeless Tobacco Never    Labs: Review Flowsheet  More data exists      Latest Ref Rng & Units 01/31/2019 05/19/2019 04/20/2020 08/02/2020 08/05/2023  Labs for ITP Cardiac and Pulmonary Rehab  Cholestrol 100 - 199 mg/dL - 885  882  - 868   LDL (calc) 0 - 99 mg/dL - 60  67  - 78   HDL-C >39 mg/dL - 28  32  - 33   Trlycerides 0 - 149 mg/dL - 871  95  - 894   Hemoglobin A1c 4.8 - 5.6 % - 5.8  - - -  PH, Arterial 7.350 - 7.450 7.149  - - - -  PCO2 arterial 32.0 - 48.0 mmHg 47.6  - - - -  Bicarbonate 20.0 - 28.0 mmol/L 16.5  - - - -  TCO2 22 - 32 mmol/L 18  - - 24  -  Acid-base deficit 0.0 -  2.0 mmol/L 12.0  - - - -  O2 Saturation % 81.0  - - - -    Capillary Blood Glucose: Lab Results  Component Value Date   GLUCAP 193 (H) 08/02/2020   GLUCAP 181 (H) 08/02/2020   GLUCAP 130 (H) 08/02/2020     Pulmonary Assessment Scores:  Pulmonary Assessment Scores     Row Name 11/06/23 1035         ADL UCSD   ADL Phase Entry     SOB Score total 13       CAT Score   CAT Score 10       mMRC Score   mMRC Score 1             UCSD: Self-administered rating of dyspnea associated with activities of daily living (ADLs) 6-point scale (0 = not at all to 5 = maximal or unable to do because of breathlessness)  Scoring Scores range from 0 to 120.  Minimally important difference is 5 units  CAT: CAT can identify the health impairment of COPD patients and is better correlated with disease progression.  CAT has a scoring range of zero to  40. The CAT score is classified into four groups of low (less than 10), medium (10 - 20), high (21-30) and very high (31-40) based on the impact level of disease on health status. A CAT score over 10 suggests significant symptoms.  A worsening CAT score could be explained by an exacerbation, poor medication adherence, poor inhaler technique, or progression of COPD or comorbid conditions.  CAT MCID is 2 points  mMRC: mMRC (Modified Medical Research Council) Dyspnea Scale is used to assess the degree of baseline functional disability in patients of respiratory disease due to dyspnea. No minimal important difference is established. A decrease in score of 1 point or greater is considered a positive change.   Pulmonary Function Assessment:  Pulmonary Function Assessment - 11/06/23 1106       Breath   Bilateral Breath Sounds Clear    Shortness of Breath Yes;Limiting activity             Exercise Target Goals: Exercise Program Goal: Individual exercise prescription set using results from initial 6 min walk test and THRR while considering  patient's activity barriers and safety.   Exercise Prescription Goal: Initial exercise prescription builds to 30-45 minutes a day of aerobic activity, 2-3 days per week.  Home exercise guidelines will be given to patient during program as part of exercise prescription that the participant will acknowledge.  Activity Barriers & Risk Stratification:  Activity Barriers & Cardiac Risk Stratification - 11/06/23 1027       Activity Barriers & Cardiac Risk Stratification   Activity Barriers None             6 Minute Walk:  6 Minute Walk     Row Name 11/06/23 1141         6 Minute Walk   Phase Initial     Distance 1820 feet     Walk Time 6 minutes     # of Rest Breaks 0     MPH 3.45     METS 3.95     RPE 9     Perceived Dyspnea  0.5     VO2 Peak 13.84     Symptoms No     Resting HR 81 bpm     Resting BP 120/60     Resting Oxygen  Saturation  95 %  Exercise Oxygen Saturation  during 6 min walk 94 %     Max Ex. HR 97 bpm     Max Ex. BP 110/64     2 Minute Post BP 108/64       Interval HR   1 Minute HR 83     2 Minute HR 83     3 Minute HR 97     4 Minute HR 97     5 Minute HR 97     6 Minute HR 97     2 Minute Post HR 92     Interval Heart Rate? Yes       Interval Oxygen   Interval Oxygen? Yes     Baseline Oxygen Saturation % 95 %     1 Minute Oxygen Saturation % 96 %     1 Minute Liters of Oxygen 0 L     2 Minute Oxygen Saturation % 94 %     2 Minute Liters of Oxygen 0 L     3 Minute Oxygen Saturation % 94 %     3 Minute Liters of Oxygen 0 L     4 Minute Oxygen Saturation % 94 %     4 Minute Liters of Oxygen 0 L     5 Minute Oxygen Saturation % 95 %     5 Minute Liters of Oxygen 0 L     6 Minute Oxygen Saturation % 95 %     6 Minute Liters of Oxygen 0 L     2 Minute Post Oxygen Saturation % 97 %     2 Minute Post Liters of Oxygen 0 L              Oxygen Initial Assessment:  Oxygen Initial Assessment - 11/06/23 1028       Home Oxygen   Home Oxygen Device None    Sleep Oxygen Prescription None    Home Exercise Oxygen Prescription None    Home Resting Oxygen Prescription None      Initial 6 min Walk   Oxygen Used None      Program Oxygen Prescription   Program Oxygen Prescription None      Intervention   Short Term Goals To learn and understand importance of maintaining oxygen saturations>88%;To learn and understand importance of monitoring SPO2 with pulse oximeter and demonstrate accurate use of the pulse oximeter.;To learn and demonstrate proper pursed lip breathing techniques or other breathing techniques.     Long  Term Goals Exhibits proper breathing techniques, such as pursed lip breathing or other method taught during program session;Verbalizes importance of monitoring SPO2 with pulse oximeter and return demonstration;Maintenance of O2 saturations>88%             Oxygen  Re-Evaluation:   Oxygen Discharge (Final Oxygen Re-Evaluation):   Initial Exercise Prescription:  Initial Exercise Prescription - 11/06/23 1100       Date of Initial Exercise RX and Referring Provider   Date 11/06/23    Referring Provider Hilty    Expected Discharge Date 01/30/24      Treadmill   MPH 2.7    Grade 1    Minutes 15    METs 3.9      NuStep   Level 3    SPM 70    Minutes 15    METs 3.5      Prescription Details   Frequency (times per week) 2    Duration Progress to 30 minutes of continuous  aerobic without signs/symptoms of physical distress      Intensity   THRR 40-80% of Max Heartrate 62-125    Ratings of Perceived Exertion 11-13    Perceived Dyspnea 0-4      Progression   Progression Continue to progress workloads to maintain intensity without signs/symptoms of physical distress.      Resistance Training   Training Prescription Yes    Weight --   black bands   Reps 10-15             Perform Capillary Blood Glucose checks as needed.  Exercise Prescription Changes:   Exercise Comments:   Exercise Goals and Review:   Exercise Goals     Row Name 11/06/23 1027             Exercise Goals   Increase Physical Activity Yes       Intervention Provide advice, education, support and counseling about physical activity/exercise needs.;Develop an individualized exercise prescription for aerobic and resistive training based on initial evaluation findings, risk stratification, comorbidities and participant's personal goals.       Expected Outcomes Short Term: Attend rehab on a regular basis to increase amount of physical activity.;Long Term: Exercising regularly at least 3-5 days a week.;Long Term: Add in home exercise to make exercise part of routine and to increase amount of physical activity.       Increase Strength and Stamina Yes       Intervention Provide advice, education, support and counseling about physical activity/exercise  needs.;Develop an individualized exercise prescription for aerobic and resistive training based on initial evaluation findings, risk stratification, comorbidities and participant's personal goals.       Expected Outcomes Short Term: Increase workloads from initial exercise prescription for resistance, speed, and METs.;Short Term: Perform resistance training exercises routinely during rehab and add in resistance training at home;Long Term: Improve cardiorespiratory fitness, muscular endurance and strength as measured by increased METs and functional capacity ( )       Able to understand and use rate of perceived exertion (RPE) scale Yes       Intervention Provide education and explanation on how to use RPE scale       Expected Outcomes Short Term: Able to use RPE daily in rehab to express subjective intensity level;Long Term:  Able to use RPE to guide intensity level when exercising independently       Able to understand and use Dyspnea scale Yes       Intervention Provide education and explanation on how to use Dyspnea scale       Expected Outcomes Short Term: Able to use Dyspnea scale daily in rehab to express subjective sense of shortness of breath during exertion;Long Term: Able to use Dyspnea scale to guide intensity level when exercising independently       Able to check pulse independently Yes       Intervention Provide education and demonstration on how to check pulse in carotid and radial arteries.;Review the importance of being able to check your own pulse for safety during independent exercise       Expected Outcomes Short Term: Able to explain why pulse checking is important during independent exercise;Long Term: Able to check pulse independently and accurately       Understanding of Exercise Prescription Yes       Intervention Provide education, explanation, and written materials on patient's individual exercise prescription       Expected Outcomes Short Term: Able to explain program  exercise prescription;Long  Term: Able to explain home exercise prescription to exercise independently                Exercise Goals Re-Evaluation :   Discharge Exercise Prescription (Final Exercise Prescription Changes):   Nutrition:  Target Goals: Understanding of nutrition guidelines, daily intake of sodium 1500mg , cholesterol 200mg , calories 30% from fat and 7% or less from saturated fats, daily to have 5 or more servings of fruits and vegetables.  Biometrics:  Pre Biometrics - 11/06/23 1149       Pre Biometrics   Grip Strength 36 kg              Nutrition Therapy Plan and Nutrition Goals:   Nutrition Assessments:  MEDIFICTS Score Key: >=70 Need to make dietary changes  40-70 Heart Healthy Diet <= 40 Therapeutic Level Cholesterol Diet  Flowsheet Row INTENSIVE CARDIAC REHAB from 03/04/2023 in [facility] for Heart, Vascular, & Lung Health  Picture Your Plate Total Score on Admission 40      Picture Your Plate Scores: <59 Unhealthy dietary pattern with much room for improvement. 41-50 Dietary pattern unlikely to meet recommendations for good health and room for improvement. 51-60 More healthful dietary pattern, with some room for improvement.  >60 Healthy dietary pattern, although there may be some specific behaviors that could be improved.    Nutrition Goals Re-Evaluation:   Nutrition Goals Discharge (Final Nutrition Goals Re-Evaluation):   Psychosocial: Target Goals: Acknowledge presence or absence of significant depression and/or stress, maximize coping skills, provide positive support system. Participant is able to verbalize types and ability to use techniques and skills needed for reducing stress and depression.  Initial Review & Psychosocial Screening:  Initial Psych Review & Screening - 11/06/23 1029       Initial Review   Current issues with None Identified      Family Dynamics   Good Support System? Yes     Comments Patient has a very supportive wife and family, children.      Barriers   Psychosocial barriers to participate in program There are no identifiable barriers or psychosocial needs.      Screening Interventions   Interventions Encouraged to exercise    Expected Outcomes Long Term Goal: Stressors or current issues are controlled or eliminated.;Short Term goal: Identification and review with participant of any Quality of Life or Depression concerns found by scoring the questionnaire.;Long Term goal: The participant improves quality of Life and PHQ9 Scores as seen by post scores and/or verbalization of changes;Short Term goal: Utilizing psychosocial counselor, staff and physician to assist with identification of specific Stressors or current issues interfering with healing process. Setting desired goal for each stressor or current issue identified.             Quality of Life Scores:  Scores of 19 and below usually indicate a poorer quality of life in these areas.  A difference of  2-3 points is a clinically meaningful difference.  A difference of 2-3 points in the total score of the Quality of Life Index has been associated with significant improvement in overall quality of life, self-image, physical symptoms, and general health in studies assessing change in quality of life.  PHQ-9: Review Flowsheet       11/06/2023 01/01/2023 03/22/2014  Depression screen PHQ 2/9  Decreased Interest 1 2 0  Down, Depressed, Hopeless 0 2 0  PHQ - 2 Score 1 4 0  Altered sleeping 0 2 -  Tired, decreased energy 1  2 -  Change in appetite 0 2 -  Feeling bad or failure about yourself  1 2 -  Trouble concentrating 0 0 -  Moving slowly or fidgety/restless 0 0 -  Suicidal thoughts 0 0 -  PHQ-9 Score 3 12 -  Difficult doing work/chores Somewhat difficult Somewhat difficult -   Interpretation of Total Score  Total Score Depression Severity:  1-4 = Minimal depression, 5-9 = Mild depression, 10-14 =  Moderate depression, 15-19 = Moderately severe depression, 20-27 = Severe depression   Psychosocial Evaluation and Intervention:  Psychosocial Evaluation - 11/06/23 1030       Psychosocial Evaluation & Interventions   Interventions Encouraged to exercise with the program and follow exercise prescription    Comments Burnard denies any psy/soc barriers or concerns    Expected Outcomes For Kelly to exercise in PR free of any psy/soc barriers or concerns    Continue Psychosocial Services  No Follow up required             Psychosocial Re-Evaluation:   Psychosocial Discharge (Final Psychosocial Re-Evaluation):   Education: Education Goals: Education classes will be provided on a weekly basis, covering required topics. Participant will state understanding/return demonstration of topics presented.  Learning Barriers/Preferences:  Learning Barriers/Preferences - 11/06/23 1031       Learning Barriers/Preferences   Learning Barriers None    Learning Preferences Group Instruction;Individual Instruction;Written Material             Education Topics: Know Your Numbers Group instruction that is supported by a PowerPoint presentation. Instructor discusses importance of knowing and understanding resting, exercise, and post-exercise oxygen saturation, heart rate, and blood pressure. Oxygen saturation, heart rate, blood pressure, rating of perceived exertion, and dyspnea are reviewed along with a normal range for these values.    Exercise for the Pulmonary Patient Group instruction that is supported by a PowerPoint presentation. Instructor discusses benefits of exercise, core components of exercise, frequency, duration, and intensity of an exercise routine, importance of utilizing pulse oximetry during exercise, safety while exercising, and options of places to exercise outside of rehab.    MET Level  Group instruction provided by PowerPoint, verbal discussion, and written material to  support subject matter. Instructor reviews what METs are and how to increase METs.    Pulmonary Medications Verbally interactive group education provided by instructor with focus on inhaled medications and proper administration.   Anatomy and Physiology of the Respiratory System Group instruction provided by PowerPoint, verbal discussion, and written material to support subject matter. Instructor reviews respiratory cycle and anatomical components of the respiratory system and their functions. Instructor also reviews differences in obstructive and restrictive respiratory diseases with examples of each.    Oxygen Safety Group instruction provided by PowerPoint, verbal discussion, and written material to support subject matter. There is an overview of "What is Oxygen" and "Why do we need it".  Instructor also reviews how to create a safe environment for oxygen use, the importance of using oxygen as prescribed, and the risks of noncompliance. There is a brief discussion on traveling with oxygen and resources the patient may utilize.   Oxygen Use Group instruction provided by PowerPoint, verbal discussion, and written material to discuss how supplemental oxygen is prescribed and different types of oxygen supply systems. Resources for more information are provided.    Breathing Techniques Group instruction that is supported by demonstration and informational handouts. Instructor discusses the benefits of pursed lip and diaphragmatic breathing and detailed demonstration on how to  perform both.     Risk Factor Reduction Group instruction that is supported by a PowerPoint presentation. Instructor discusses the definition of a risk factor, different risk factors for pulmonary disease, and how the heart and lungs work together.   Pulmonary Diseases Group instruction provided by PowerPoint, verbal discussion, and written material to support subject matter. Instructor gives an overview of the  different type of pulmonary diseases. There is also a discussion on risk factors and symptoms as well as ways to manage the diseases.   Stress and Energy Conservation Group instruction provided by PowerPoint, verbal discussion, and written material to support subject matter. Instructor gives an overview of stress and the impact it can have on the body. Instructor also reviews ways to reduce stress. There is also a discussion on energy conservation and ways to conserve energy throughout the day.   Warning Signs and Symptoms Group instruction provided by PowerPoint, verbal discussion, and written material to support subject matter. Instructor reviews warning signs and symptoms of stroke, heart attack, cold and flu. Instructor also reviews ways to prevent the spread of infection.   Other Education Group or individual verbal, written, or video instructions that support the educational goals of the pulmonary rehab program.    Knowledge Questionnaire Score:   Core Components/Risk Factors/Patient Goals at Admission:  Personal Goals and Risk Factors at Admission - 11/06/23 1031       Core Components/Risk Factors/Patient Goals on Admission   Improve shortness of breath with ADL's Yes    Intervention Provide education, individualized exercise plan and daily activity instruction to help decrease symptoms of SOB with activities of daily living.    Expected Outcomes Short Term: Improve cardiorespiratory fitness to achieve a reduction of symptoms when performing ADLs;Long Term: Be able to perform more ADLs without symptoms or delay the onset of symptoms             Core Components/Risk Factors/Patient Goals Review:    Core Components/Risk Factors/Patient Goals at Discharge (Final Review):    ITP Comments:   Comments: Dr. Slater Staff is Medical Director for Pulmonary Rehab at [facility]

## 2023-11-06 NOTE — Progress Notes (Signed)
 Alan White 63 y.o. male  Pulmonary Rehab Orientation Note  This patient who was referred to Pulmonary Rehab by Dr. Mona, cardiologist, with the diagnosis of systolic heart failure arrived today in Cardiac and Pulmonary Rehab. He arrived ambulatory with normal gait. He does not carry portable oxygen. Per patient, Alan White uses oxygen never.   Patient's medical history, psychosocial health, and medications reviewed. Psychosocial assessment reveals patient lives with spouse. Alan White is currently retired. Patient hobbies include listening to music. Patient reports his stress level is moderate. Areas of stress/anxiety include health. Patient does not exhibit signs of depression. Signs of depression include guilt and hopelessness and fatigue. PHQ2/9 score 1/3. Alan White shows good  coping skills with positive outlook on life. Offered emotional support and reassurance. Will continue to monitor and evaluate progress toward psychosocial goal(s) of decreased health related stress.  Physical assessment as follows:   Well appearing, A&Ox4, NAD Eyes/Ears: WNL  Lungs: Clear with no wheezes, rales, rhonchi, denies chronic cough, dyspnea on exertion Heart: Regular rate rhythm, no murmurs, no rubs, no clicks Gastrointestinal: abdomin soft, + bowel sounds in all 4 quads, denies recent weight gain or loss, endorses normal BMs Genitourinary: WNL, pt denies s/s Extremities:  +2 pulses, grip strength equal, strong, no edema, no cyanosis, no clubbing Integumentary: pt denies any rashes, open or non healing wounds Psy/Soc: Pt stated worried about breathing, has to stop and take breaks with exertion Assistive devices: none  Alan White reports he  does take medications as prescribed. Patient states he  follows a low fat  and low sodium  diet. The patient reports no specific efforts to gain or lose weight. Pt's weight will be monitored closely.   Demonstration and practice of PLB using pulse oximeter. Alan White able to  return demonstration satisfactorily. Safety and hand hygiene in the exercise area reviewed with patient. Alan White voices understanding of the information reviewed. Department expectations discussed with patient and achievable goals were set. The patient shows enthusiasm about attending the program and we look forward to working with Alan White Alan completed a 6 min walk test today and is scheduled to begin exercise on 11/12/23 at 1015.   1030-1100 Alan Levin, RN, BSN

## 2023-11-12 ENCOUNTER — Encounter (HOSPITAL_COMMUNITY)
Admission: RE | Admit: 2023-11-12 | Discharge: 2023-11-12 | Disposition: A | Discharge status: Home / Self Care | Payer: PPO | Source: Ambulatory Visit | Attending: Internal Medicine | Admitting: Internal Medicine

## 2023-11-12 DIAGNOSIS — I5022 Chronic systolic (congestive) heart failure: Secondary | ICD-10-CM | POA: Diagnosis not present

## 2023-11-12 DIAGNOSIS — J0140 Acute pansinusitis, unspecified: Secondary | ICD-10-CM | POA: Diagnosis not present

## 2023-11-12 NOTE — Progress Notes (Signed)
Daily Session Note  Patient Details  Name: Alan White MRN: [id number] Date of Birth: [birth year] Referring Provider:   Doristine Devoid Pulmonary Rehab Walk Test from 11/06/2023 in [facility]  Referring Provider Hilty       Encounter Date: 11/12/2023  Check In:  Session Check In - 11/12/23 1126       Check-In   Supervising physician immediately available to respond to emergencies [facility] MD immediately available    Physician(s) Hoover Browns, NP    Location [facility]    Staff Present Essie Hart, RN, Doris Cheadle, MS, ACSM-CEP, Exercise Physiologist;Randi Idelle Crouch BS, ACSM-CEP, Exercise Physiologist;Casey Katrinka Blazing, RT    Virtual Visit No    Medication changes reported     No    Fall or balance concerns reported    No    Tobacco Cessation No Change   stopped smoking 13 days ago   Warm-up and Cool-down Performed as group-led instruction   only   Resistance Training Performed Yes    VAD Patient? No    PAD/SET Patient? No      Pain Assessment   Currently in Pain? No/denies    Multiple Pain Sites No             Capillary Blood Glucose: No results found for this or any previous visit (from the past 24 hours).    Social History   Tobacco Use  Smoking Status Former   Current packs/day: 0.00   Average packs/day: 0.5 packs/day for 50.0 years (25.0 ttl pk-yrs)   Types: Cigarettes   Quit date: 10/14/2023   Years since quitting: 0.0  Smokeless Tobacco Never    Goals Met:  Proper associated with RPD/PD & O2 Sat Exercise tolerated well No report of concerns or symptoms today Strength training completed today  Goals Unmet:  Not Applicable  Comments: Service time is from 1005 to 1137.    Dr. Mechele Collin is Medical Director for Pulmonary Rehab at [facility].

## 2023-11-14 ENCOUNTER — Encounter (HOSPITAL_COMMUNITY)
Admission: RE | Admit: 2023-11-14 | Discharge: 2023-11-14 | Discharge status: Home / Self Care | Payer: PPO | Source: Ambulatory Visit | Attending: Internal Medicine

## 2023-11-14 DIAGNOSIS — I5022 Chronic systolic (congestive) heart failure: Secondary | ICD-10-CM | POA: Diagnosis not present

## 2023-11-14 NOTE — Progress Notes (Signed)
Daily Session Note  Patient Details  Name: Alan White MRN: [id number] Date of Birth: 1961-02-17 Referring Provider:   Doristine Devoid Pulmonary Rehab Walk Test from 11/06/2023 in [facility]  Referring Provider Hilty       Encounter Date: 11/14/2023  Check In:  Session Check In - 11/14/23 1152       Check-In   Supervising physician immediately available to respond to emergencies [facility] MD immediately available    Physician(s) Rise Paganini, NP    Location [facility]    Staff Present Essie Hart, RN, Doris Cheadle, MS, ACSM-CEP, Exercise Physiologist;Casey Chester Holstein, MS, Exercise Physiologist    Virtual Visit No    Medication changes reported     No    Fall or balance concerns reported    No    Tobacco Cessation No Change    Warm-up and Cool-down Performed as group-led instruction    Resistance Training Performed Yes    VAD Patient? No    PAD/SET Patient? No      Pain Assessment   Currently in Pain? No/denies    Pain Score 0-No pain    Multiple Pain Sites No             Capillary Blood Glucose: No results found for this or any previous visit (from the past 24 hours).    Social History   Tobacco Use  Smoking Status Former   Current packs/day: 0.00   Average packs/day: 0.5 packs/day for 50.0 years (25.0 ttl pk-yrs)   Types: Cigarettes   Quit date: 10/14/2023   Years since quitting: 0.0  Smokeless Tobacco Never    Goals Met:  Proper associated with RPD/PD & O2 Sat Independence with exercise equipment Exercise tolerated well No report of concerns or symptoms today Strength training completed today  Goals Unmet:  Not Applicable  Comments: Service time is from 1013 to 1142.    Dr. Mechele Collin is Medical Director for Pulmonary Rehab at [facility].

## 2023-11-19 ENCOUNTER — Encounter (HOSPITAL_COMMUNITY)
Admission: RE | Admit: 2023-11-19 | Discharge: 2023-11-19 | Disposition: A | Discharge status: Home / Self Care | Payer: PPO | Source: Ambulatory Visit | Attending: Internal Medicine | Admitting: Internal Medicine

## 2023-11-19 VITALS — Wt 211.2 lb

## 2023-11-19 DIAGNOSIS — I5022 Chronic systolic (congestive) heart failure: Secondary | ICD-10-CM

## 2023-11-19 NOTE — Progress Notes (Signed)
Daily Session Note  Patient Details  Name: Alan White MRN: [id number] Date of Birth: May 14, 1961 Referring Provider:   Doristine Devoid Pulmonary Rehab Walk Test from 11/06/2023 in [facility]  Referring Provider Hilty       Encounter Date: 11/19/2023  Check In:  Session Check In - 11/19/23 1104       Check-In   Supervising physician immediately available to respond to emergencies [facility] MD immediately available    Physician(s) Carlyon Shadow, NP    Location [facility]    Staff Present Essie Hart, RN, Doris Cheadle, MS, ACSM-CEP, Exercise Physiologist;Casey Erin Sons BS, ACSM-CEP, Exercise Physiologist;Olinty Peggye Pitt, MS, ACSM-CEP, Exercise Physiologist    Virtual Visit No    Medication changes reported     No    Fall or balance concerns reported    No    Tobacco Cessation No Change    Warm-up and Cool-down Performed as group-led instruction    Resistance Training Performed Yes    VAD Patient? No    PAD/SET Patient? No      Pain Assessment   Currently in Pain? No/denies    Multiple Pain Sites No             Capillary Blood Glucose: No results found for this or any previous visit (from the past 24 hours).   Exercise Prescription Changes - 11/19/23 1200       Response to Exercise   Blood Pressure (Admit) 110/58    Blood Pressure (Exercise) 132/76    Blood Pressure (Exit) 102/64    Heart Rate (Admit) 70 bpm    Heart Rate (Exercise) 119 bpm    Heart Rate (Exit) 86 bpm    Oxygen Saturation (Admit) 97 %    Oxygen Saturation (Exercise) 95 %    Oxygen Saturation (Exit) 95 %    Rating of Perceived Exertion (Exercise) 12    Perceived Dyspnea (Exercise) 1    Duration Progress to 30 minutes of  aerobic without signs/symptoms of physical distress    Intensity THRR unchanged      Progression   Progression Continue to progress workloads to maintain intensity without  signs/symptoms of physical distress.      Treadmill   MPH 3    Grade 1.5    Minutes 15    METs 3.5      NuStep   Level 4    SPM 116    Minutes 15    METs 4.8             Social History   Tobacco Use  Smoking Status Former   Current packs/day: 0.00   Average packs/day: 0.5 packs/day for 50.0 years (25.0 ttl pk-yrs)   Types: Cigarettes   Quit date: 10/14/2023   Years since quitting: 0.0  Smokeless Tobacco Never    Goals Met:  Independence with exercise equipment Exercise tolerated well No report of concerns or symptoms today Strength training completed today  Goals Unmet:  Not Applicable  Comments: Service time is from 1012 to 1131    Dr. Mechele Collin is Medical Director for Pulmonary Rehab at [facility]

## 2023-11-20 NOTE — Progress Notes (Signed)
Pulmonary Individual Treatment Plan  Patient Details  Name: Alan White MRN: [id number] Date of Birth: 1960-11-15 Referring Provider:   Doristine Devoid Pulmonary Rehab Walk Test from 11/06/2023 in [facility]  Referring Provider Hilty       Initial Encounter Date:  Flowsheet Row Pulmonary Rehab Walk Test from 11/06/2023 in [facility], [facility]  Date 11/06/23       Visit Diagnosis: Heart failure, chronic systolic (HCC)  Patient's Home Medications on Admission:   Current Outpatient Medications:    aspirin EC 81 MG tablet, Take 1 tablet (81 mg total) by mouth daily., Disp: 30 tablet, Rfl: 11   atorvastatin (LIPITOR) 80 MG tablet, Take 1 tablet (80 mg total) by mouth daily., Disp: 90 tablet, Rfl: 2   clopidogrel (PLAVIX) 75 MG tablet, Take 1 tablet (75 mg total) by mouth daily., Disp: 90 tablet, Rfl: 2   isosorbide mononitrate (IMDUR) 30 MG 24 hr tablet, Take 1 tablet (30 mg total) by mouth daily., Disp: 90 tablet, Rfl: 3   metoprolol succinate (TOPROL XL) 25 MG 24 hr tablet, Take 0.5 tablets (12.5 mg total) by mouth daily., Disp: 90 tablet, Rfl: 3   nitroGLYCERIN (NITROSTAT) 0.4 MG SL tablet, Place 1 tablet (0.4 mg total) under the tongue every 5 (five) minutes x 3 doses as needed for chest pain., Disp: 25 tablet, Rfl: 12   pantoprazole (PROTONIX) 40 MG tablet, Take 1 tablet (40 mg total) by mouth daily., Disp: 90 tablet, Rfl: 3   sacubitril-valsartan (ENTRESTO) 24-26 MG, Take 1 tablet by mouth 2 (two) times daily., Disp: 180 tablet, Rfl: 3  Past Medical History: Past Medical History:  Diagnosis Date   AICD (automatic cardioverter/defibrillator) present    [employer]) [employer] ICD   CAD (coronary artery disease), 12/29/13 PCI/DES LCX and PCI/DES LAD with overlapping DES  12/30/2013   cath 07/05/14 OK   CHF (congestive heart failure) (HCC)    Dyslipidemia, goal LDL below 70  12/30/2013   GERD (gastroesophageal reflux disease)    History of kidney stones    Metabolic syndrome, HgbA1C 6.0  12/30/2013   Myocardial infarction [facility]) 2015,2020   Smoker    Wears partial dentures    upper    Tobacco Use: Social History   Tobacco Use  Smoking Status Former   Current packs/day: 0.00   Average packs/day: 0.5 packs/day for 50.0 years (25.0 ttl pk-yrs)   Types: Cigarettes   Quit date: 10/14/2023   Years since quitting: 0.1  Smokeless Tobacco Never    Labs: Review Flowsheet  More data exists      Latest Ref Rng & Units 01/31/2019 05/19/2019 04/20/2020 08/02/2020 08/05/2023  Labs for ITP Cardiac and Pulmonary Rehab  Cholestrol 100 - 199 mg/dL - 409  811  - 914   LDL (calc) 0 - 99 mg/dL - 60  67  - 78   HDL-C >39 mg/dL - 28  32  - 33   Trlycerides 0 - 149 mg/dL - 782  95  - 956   Hemoglobin A1c 4.8 - 5.6 % - 5.8  - - -  PH, Arterial 7.350 - 7.450 7.149  - - - -  PCO2 arterial 32.0 - 48.0 mmHg 47.6  - - - -  Bicarbonate 20.0 - 28.0 mmol/L 16.5  - - - -  TCO2 22 - 32 mmol/L 18  - - 24  -  Acid-base deficit 0.0 -  2.0 mmol/L 12.0  - - - -  O2 Saturation % 81.0  - - - -    Capillary Blood Glucose: Lab Results  Component Value Date   GLUCAP 193 (H) 08/02/2020   GLUCAP 181 (H) 08/02/2020   GLUCAP 130 (H) 08/02/2020     Pulmonary Assessment Scores:  Pulmonary Assessment Scores     Row Name 11/06/23 1035         ADL UCSD   ADL Phase Entry     SOB Score total 13       CAT Score   CAT Score 10       mMRC Score   mMRC Score 1             UCSD: Self-administered rating of dyspnea associated with activities of daily living (ADLs) 6-point scale (0 = "not at all" to 5 = "maximal or unable to do because of breathlessness")  Scoring Scores range from 0 to 120.  Minimally important difference is 5 units  CAT: CAT can identify the health impairment of COPD patients and is better correlated with disease progression.  CAT has a scoring range of zero to  40. The CAT score is classified into four groups of low (less than 10), medium (10 - 20), high (21-30) and very high (31-40) based on the impact level of disease on health status. A CAT score over 10 suggests significant symptoms.  A worsening CAT score could be explained by an exacerbation, poor medication adherence, poor inhaler technique, or progression of COPD or comorbid conditions.  CAT MCID is 2 points  mMRC: mMRC (Modified Medical Research Council) Dyspnea Scale is used to assess the degree of baseline functional disability in patients of respiratory disease due to dyspnea. No minimal important difference is established. A decrease in score of 1 point or greater is considered a positive change.   Pulmonary Function Assessment:  Pulmonary Function Assessment - 11/06/23 1106       Breath   Bilateral Breath Sounds Clear    Shortness of Breath Yes;Limiting activity             Exercise Target Goals: Exercise Program Goal: Individual exercise prescription set using results from initial 6 min walk test and THRR while considering  patient's activity barriers and safety.   Exercise Prescription Goal: Initial exercise prescription builds to 30-45 minutes a day of aerobic activity, 2-3 days per week.  Home exercise guidelines will be given to patient during program as part of exercise prescription that the participant will acknowledge.  Activity Barriers & Risk Stratification:  Activity Barriers & Cardiac Risk Stratification - 11/06/23 1027       Activity Barriers & Cardiac Risk Stratification   Activity Barriers None             6 Minute Walk:  6 Minute Walk     Row Name 11/06/23 1141         6 Minute Walk   Phase Initial     Distance 1820 feet     Walk Time 6 minutes     # of Rest Breaks 0     MPH 3.45     METS 3.95     RPE 9     Perceived Dyspnea  0.5     VO2 Peak 13.84     Symptoms No     Resting HR 81 bpm     Resting BP 120/60     Resting Oxygen  Saturation  95 %  Exercise Oxygen Saturation  during 6 min walk 94 %     Max Ex. HR 97 bpm     Max Ex. BP 110/64     2 Minute Post BP 108/64       Interval HR   1 Minute HR 83     2 Minute HR 83     3 Minute HR 97     4 Minute HR 97     5 Minute HR 97     6 Minute HR 97     2 Minute Post HR 92     Interval Heart Rate? Yes       Interval Oxygen   Interval Oxygen? Yes     Baseline Oxygen Saturation % 95 %     1 Minute Oxygen Saturation % 96 %     1 Minute Liters of Oxygen 0 L     2 Minute Oxygen Saturation % 94 %     2 Minute Liters of Oxygen 0 L     3 Minute Oxygen Saturation % 94 %     3 Minute Liters of Oxygen 0 L     4 Minute Oxygen Saturation % 94 %     4 Minute Liters of Oxygen 0 L     5 Minute Oxygen Saturation % 95 %     5 Minute Liters of Oxygen 0 L     6 Minute Oxygen Saturation % 95 %     6 Minute Liters of Oxygen 0 L     2 Minute Post Oxygen Saturation % 97 %     2 Minute Post Liters of Oxygen 0 L              Oxygen Initial Assessment:  Oxygen Initial Assessment - 11/06/23 1028       Home Oxygen   Home Oxygen Device None    Sleep Oxygen Prescription None    Home Exercise Oxygen Prescription None    Home Resting Oxygen Prescription None      Initial 6 min Walk   Oxygen Used None      Program Oxygen Prescription   Program Oxygen Prescription None      Intervention   Short Term Goals To learn and understand importance of maintaining oxygen saturations>88%;To learn and understand importance of monitoring SPO2 with pulse oximeter and demonstrate accurate use of the pulse oximeter.;To learn and demonstrate proper pursed lip breathing techniques or other breathing techniques.     Long  Term Goals Exhibits proper breathing techniques, such as pursed lip breathing or other method taught during program session;Verbalizes importance of monitoring SPO2 with pulse oximeter and return demonstration;Maintenance of O2 saturations>88%             Oxygen  Re-Evaluation:  Oxygen Re-Evaluation     Row Name 11/06/23 1028             Goals/Expected Outcomes   Goals/Expected Outcomes Compliance and understanding of oxygen saturation and breathing techniques to decrease shortness of breath.                Oxygen Discharge (Final Oxygen Re-Evaluation):  Oxygen Re-Evaluation - 11/06/23 1028       Goals/Expected Outcomes   Goals/Expected Outcomes Compliance and understanding of oxygen saturation and breathing techniques to decrease shortness of breath.             Initial Exercise Prescription:  Initial Exercise Prescription - 11/06/23 1100       Date  of Initial Exercise RX and Referring Provider   Date 11/06/23    Referring Provider Hilty    Expected Discharge Date 01/30/24      Treadmill   MPH 2.7    Grade 1    Minutes 15    METs 3.9      NuStep   Level 3    SPM 70    Minutes 15    METs 3.5      Prescription Details   Frequency (times per week) 2    Duration Progress to 30 minutes of continuous aerobic without signs/symptoms of physical distress      Intensity   THRR 40-80% of Max Heartrate 62-125    Ratings of Perceived Exertion 11-13    Perceived Dyspnea 0-4      Progression   Progression Continue to progress workloads to maintain intensity without signs/symptoms of physical distress.      Resistance Training   Training Prescription Yes    Weight --   black bands   Reps 10-15             Perform Capillary Blood Glucose checks as needed.  Exercise Prescription Changes:   Exercise Prescription Changes     Row Name 11/19/23 1200             Response to Exercise   Blood Pressure (Admit) 110/58       Blood Pressure (Exercise) 132/76       Blood Pressure (Exit) 102/64       Heart Rate (Admit) 70 bpm       Heart Rate (Exercise) 119 bpm       Heart Rate (Exit) 86 bpm       Oxygen Saturation (Admit) 97 %       Oxygen Saturation (Exercise) 95 %       Oxygen Saturation (Exit) 95 %        Rating of Perceived Exertion (Exercise) 12       Perceived Dyspnea (Exercise) 1       Duration Progress to 30 minutes of  aerobic without signs/symptoms of physical distress       Intensity THRR unchanged         Progression   Progression Continue to progress workloads to maintain intensity without signs/symptoms of physical distress.         Treadmill   MPH 3       Grade 1.5       Minutes 15       METs 3.5         NuStep   Level 4       SPM 116       Minutes 15       METs 4.8                Exercise Comments:   Exercise Comments     Row Name 11/12/23 1155           Exercise Comments Tresa Endo completed his first day of exercise. He exercised for 15 min on the Nustep and treadmill. Kelly averaged 4.4 METs at level 3 on the Nustep and 4.3 METs at 3.5 mph and 1.5% on the treadmill. Kelly performed the warmup and cooldown standing without limitations. Discussed METs.                Exercise Goals and Review:   Exercise Goals     Row Name 11/06/23 1027  Exercise Goals   Increase Physical Activity Yes       Intervention Provide advice, education, support and counseling about physical activity/exercise needs.;Develop an individualized exercise prescription for aerobic and resistive training based on initial evaluation findings, risk stratification, comorbidities and participant's personal goals.       Expected Outcomes Short Term: Attend rehab on a regular basis to increase amount of physical activity.;Long Term: Exercising regularly at least 3-5 days a week.;Long Term: Add in home exercise to make exercise part of routine and to increase amount of physical activity.       Increase Strength and Stamina Yes       Intervention Provide advice, education, support and counseling about physical activity/exercise needs.;Develop an individualized exercise prescription for aerobic and resistive training based on initial evaluation findings, risk stratification,  comorbidities and participant's personal goals.       Expected Outcomes Short Term: Increase workloads from initial exercise prescription for resistance, speed, and METs.;Short Term: Perform resistance training exercises routinely during rehab and add in resistance training at home;Long Term: Improve cardiorespiratory fitness, muscular endurance and strength as measured by increased METs and functional capacity ( )       Able to understand and use rate of perceived exertion (RPE) scale Yes       Intervention Provide education and explanation on how to use RPE scale       Expected Outcomes Short Term: Able to use RPE daily in rehab to express subjective intensity level;Long Term:  Able to use RPE to guide intensity level when exercising independently       Able to understand and use Dyspnea scale Yes       Intervention Provide education and explanation on how to use Dyspnea scale       Expected Outcomes Short Term: Able to use Dyspnea scale daily in rehab to express subjective sense of shortness of breath during exertion;Long Term: Able to use Dyspnea scale to guide intensity level when exercising independently       Able to check pulse independently Yes       Intervention Provide education and demonstration on how to check pulse in carotid and radial arteries.;Review the importance of being able to check your own pulse for safety during independent exercise       Expected Outcomes Short Term: Able to explain why pulse checking is important during independent exercise;Long Term: Able to check pulse independently and accurately       Understanding of Exercise Prescription Yes       Intervention Provide education, explanation, and written materials on patient's individual exercise prescription       Expected Outcomes Short Term: Able to explain program exercise prescription;Long Term: Able to explain home exercise prescription to exercise independently                Exercise Goals Re-Evaluation  :  Exercise Goals Re-Evaluation     Row Name 11/11/23 1147             Exercise Goal Re-Evaluation   Exercise Goals Review Increase Physical Activity;Able to understand and use Dyspnea scale;Understanding of Exercise Prescription;Increase Strength and Stamina;Knowledge and understanding of Target Heart Rate Range (THRR);Able to understand and use rate of perceived exertion (RPE) scale       Comments Tresa Endo is scheduled to start Pulmonary Rehab on 2/11. Will continue to monitor and progress as able.       Expected Outcomes Through exercise at rehab and home, the patient will decrease shortness  of breath and feel confident in carrying out an exercise regimen at home.                Discharge Exercise Prescription (Final Exercise Prescription Changes):  Exercise Prescription Changes - 11/19/23 1200       Response to Exercise   Blood Pressure (Admit) 110/58    Blood Pressure (Exercise) 132/76    Blood Pressure (Exit) 102/64    Heart Rate (Admit) 70 bpm    Heart Rate (Exercise) 119 bpm    Heart Rate (Exit) 86 bpm    Oxygen Saturation (Admit) 97 %    Oxygen Saturation (Exercise) 95 %    Oxygen Saturation (Exit) 95 %    Rating of Perceived Exertion (Exercise) 12    Perceived Dyspnea (Exercise) 1    Duration Progress to 30 minutes of  aerobic without signs/symptoms of physical distress    Intensity THRR unchanged      Progression   Progression Continue to progress workloads to maintain intensity without signs/symptoms of physical distress.      Treadmill   MPH 3    Grade 1.5    Minutes 15    METs 3.5      NuStep   Level 4    SPM 116    Minutes 15    METs 4.8             Nutrition:  Target Goals: Understanding of nutrition guidelines, daily intake of sodium 1500mg , cholesterol 200mg , calories 30% from fat and 7% or less from saturated fats, daily to have 5 or more servings of fruits and vegetables.  Biometrics:  Pre Biometrics - 11/06/23 1149       Pre  Biometrics   Grip Strength 36 kg              Nutrition Therapy Plan and Nutrition Goals:   Nutrition Assessments:  MEDIFICTS Score Key: >=70 Need to make dietary changes  40-70 Heart Healthy Diet <= 40 Therapeutic Level Cholesterol Diet  Flowsheet Row INTENSIVE CARDIAC REHAB from 03/04/2023 in [facility] for Heart, Vascular, [facility] Lung [facility]  Picture Your Plate Total Score on Admission 40      Picture Your Plate Scores: <08 Unhealthy dietary pattern with much room for improvement. 41-50 Dietary pattern unlikely to meet recommendations for good health and room for improvement. 51-60 More healthful dietary pattern, with some room for improvement.  >60 Healthy dietary pattern, although there may be some specific behaviors that could be improved.    Nutrition Goals Re-Evaluation:   Nutrition Goals Discharge (Final Nutrition Goals Re-Evaluation):   Psychosocial: Target Goals: Acknowledge presence or absence of significant depression and/or stress, maximize coping skills, provide positive support system. Participant is able to verbalize types and ability to use techniques and skills needed for reducing stress and depression.  Initial Review & Psychosocial Screening:  Initial Psych Review & Screening - 11/06/23 1029       Initial Review   Current issues with None Identified      Family Dynamics   Good Support System? Yes    Comments Patient has a very supportive wife and family, children.      Barriers   Psychosocial barriers to participate in program There are no identifiable barriers or psychosocial needs.      Screening Interventions   Interventions Encouraged to exercise    Expected Outcomes Long Term Goal: Stressors or current issues are controlled or eliminated.;Short Term goal: Identification and review with participant  of any Quality of Life or Depression concerns found by scoring the questionnaire.;Long Term goal: The participant  improves quality of Life and PHQ9 Scores as seen by post scores and/or verbalization of changes;Short Term goal: Utilizing psychosocial counselor, staff and physician to assist with identification of specific Stressors or current issues interfering with healing process. Setting desired goal for each stressor or current issue identified.             Quality of Life Scores:  Scores of 19 and below usually indicate a poorer quality of life in these areas.  A difference of  2-3 points is a clinically meaningful difference.  A difference of 2-3 points in the total score of the Quality of Life Index has been associated with significant improvement in overall quality of life, self-image, physical symptoms, and general health in studies assessing change in quality of life.  PHQ-9: Review Flowsheet       11/06/2023 01/01/2023 03/22/2014  Depression screen PHQ 2/9  Decreased Interest 1 2 0  Down, Depressed, Hopeless 0 2 0  PHQ - 2 Score 1 4 0  Altered sleeping 0 2 -  Tired, decreased energy 1 2 -  Change in appetite 0 2 -  Feeling bad or failure about yourself  1 2 -  Trouble concentrating 0 0 -  Moving slowly or fidgety/restless 0 0 -  Suicidal thoughts 0 0 -  PHQ-9 Score 3 12 -  Difficult doing work/chores Somewhat difficult Somewhat difficult -   Interpretation of Total Score  Total Score Depression Severity:  1-4 = Minimal depression, 5-9 = Mild depression, 10-14 = Moderate depression, 15-19 = Moderately severe depression, 20-27 = Severe depression   Psychosocial Evaluation and Intervention:  Psychosocial Evaluation - 11/06/23 1030       Psychosocial Evaluation & Interventions   Interventions Encouraged to exercise with the program and follow exercise prescription    Comments Tresa Endo denies any psy/soc barriers or concerns    Expected Outcomes For Kelly to exercise in PR free of any psy/soc barriers or concerns    Continue Psychosocial Services  No Follow up required              Psychosocial Re-Evaluation:  Psychosocial Re-Evaluation     Row Name 11/13/23 1402             Psychosocial Re-Evaluation   Current issues with None Identified       Comments Tresa Endo continues to deny any psychosocial barriers or concerns at this time.       Expected Outcomes For Tresa Endo to participate in PR free of any psychosocial barriers or concerns       Interventions Encouraged to attend Pulmonary Rehabilitation for the exercise       Continue Psychosocial Services  No Follow up required                Psychosocial Discharge (Final Psychosocial Re-Evaluation):  Psychosocial Re-Evaluation - 11/13/23 1402       Psychosocial Re-Evaluation   Current issues with None Identified    Comments Tresa Endo continues to deny any psychosocial barriers or concerns at this time.    Expected Outcomes For Tresa Endo to participate in PR free of any psychosocial barriers or concerns    Interventions Encouraged to attend Pulmonary Rehabilitation for the exercise    Continue Psychosocial Services  No Follow up required             Education: Education Goals: Education classes will be provided on a  weekly basis, covering required topics. Participant will state understanding/return demonstration of topics presented.  Learning Barriers/Preferences:  Learning Barriers/Preferences - 11/06/23 1031       Learning Barriers/Preferences   Learning Barriers None    Learning Preferences Group Instruction;Individual Instruction;Written Material             Education Topics: Know Your Numbers Group instruction that is supported by a PowerPoint presentation. Instructor discusses importance of knowing and understanding resting, exercise, and post-exercise oxygen saturation, heart rate, and blood pressure. Oxygen saturation, heart rate, blood pressure, rating of perceived exertion, and dyspnea are reviewed along with a normal range for these values.    Exercise for the Pulmonary Patient Group  instruction that is supported by a PowerPoint presentation. Instructor discusses benefits of exercise, core components of exercise, frequency, duration, and intensity of an exercise routine, importance of utilizing pulse oximetry during exercise, safety while exercising, and options of places to exercise outside of rehab.    MET Level  Group instruction provided by PowerPoint, verbal discussion, and written material to support subject matter. Instructor reviews what METs are and how to increase METs.    Pulmonary Medications Verbally interactive group education provided by instructor with focus on inhaled medications and proper administration.   Anatomy and Physiology of the Respiratory System Group instruction provided by PowerPoint, verbal discussion, and written material to support subject matter. Instructor reviews respiratory cycle and anatomical components of the respiratory system and their functions. Instructor also reviews differences in obstructive and restrictive respiratory diseases with examples of each.    Oxygen Safety Group instruction provided by PowerPoint, verbal discussion, and written material to support subject matter. There is an overview of "What is Oxygen" and "Why do we need it".  Instructor also reviews how to create a safe environment for oxygen use, the importance of using oxygen as prescribed, and the risks of noncompliance. There is a brief discussion on traveling with oxygen and resources the patient may utilize.   Oxygen Use Group instruction provided by PowerPoint, verbal discussion, and written material to discuss how supplemental oxygen is prescribed and different types of oxygen supply systems. Resources for more information are provided.    Breathing Techniques Group instruction that is supported by demonstration and informational handouts. Instructor discusses the benefits of pursed lip and diaphragmatic breathing and detailed demonstration on how to  perform both.     Risk Factor Reduction Group instruction that is supported by a PowerPoint presentation. Instructor discusses the definition of a risk factor, different risk factors for pulmonary disease, and how the heart and lungs work together. Flowsheet Row PULMONARY REHAB OTHER RESPIRATORY from 11/14/2023 in [facility] for Heart, Vascular, & Lung Health  Date 11/14/23  Educator EP  Instruction Review Code 1- Verbalizes Understanding       Pulmonary Diseases Group instruction provided by PowerPoint, verbal discussion, and written material to support subject matter. Instructor gives an overview of the different type of pulmonary diseases. There is also a discussion on risk factors and symptoms as well as ways to manage the diseases.   Stress and Energy Conservation Group instruction provided by PowerPoint, verbal discussion, and written material to support subject matter. Instructor gives an overview of stress and the impact it can have on the body. Instructor also reviews ways to reduce stress. There is also a discussion on energy conservation and ways to conserve energy throughout the day.   Warning Signs and Symptoms Group instruction provided by PowerPoint, verbal discussion, and  written material to support subject matter. Instructor reviews warning signs and symptoms of stroke, heart attack, cold and flu. Instructor also reviews ways to prevent the spread of infection.   Other Education Group or individual verbal, written, or video instructions that support the educational goals of the pulmonary rehab program.    Knowledge Questionnaire Score:   Core Components/Risk Factors/Patient Goals at Admission:  Personal Goals and Risk Factors at Admission - 11/06/23 1031       Core Components/Risk Factors/Patient Goals on Admission   Improve shortness of breath with ADL's Yes    Intervention Provide education, individualized exercise plan and daily  activity instruction to help decrease symptoms of SOB with activities of daily living.    Expected Outcomes Short Term: Improve cardiorespiratory fitness to achieve a reduction of symptoms when performing ADLs;Long Term: Be able to perform more ADLs without symptoms or delay the onset of symptoms             Core Components/Risk Factors/Patient Goals Review:   Goals and Risk Factor Review     Row Name 11/13/23 1404             Core Components/Risk Factors/Patient Goals Review   Personal Goals Review Improve shortness of breath with ADL's;Develop more efficient breathing techniques such as purse lipped breathing and diaphragmatic breathing and practicing self-pacing with activity.;Tobacco Cessation       Review Tresa Endo has attended one session so far. Goal progressing for improving shortness of breath with ADL's. Goal progressing for developing more efficient breathing techniques such as purse lipped breathing and diaphragmatic breathing; and practicing self-pacing with activity. Goal met for tobacco cessation. Kelly quit smoking one week prior to starting class. He states he is continuing to refrain from smoking, but it's the hardest thing he's ever done. Staff continues to encourage Weldon on his success with quitting smoking. We will continue to monitor Kelly's progress throughout the program.       Expected Outcomes To improve shortness of breath with ADL's and develop more efficient breathing techniques such as purse lipped breathing and diaphragmatic breathing; and practicing self-pacing with activity.                Core Components/Risk Factors/Patient Goals at Discharge (Final Review):   Goals and Risk Factor Review - 11/13/23 1404       Core Components/Risk Factors/Patient Goals Review   Personal Goals Review Improve shortness of breath with ADL's;Develop more efficient breathing techniques such as purse lipped breathing and diaphragmatic breathing and practicing self-pacing  with activity.;Tobacco Cessation    Review Tresa Endo has attended one session so far. Goal progressing for improving shortness of breath with ADL's. Goal progressing for developing more efficient breathing techniques such as purse lipped breathing and diaphragmatic breathing; and practicing self-pacing with activity. Goal met for tobacco cessation. Kelly quit smoking one week prior to starting class. He states he is continuing to refrain from smoking, but it's the hardest thing he's ever done. Staff continues to encourage [place] on his success with quitting smoking. We will continue to monitor Kelly's progress throughout the program.    Expected Outcomes To improve shortness of breath with ADL's and develop more efficient breathing techniques such as purse lipped breathing and diaphragmatic breathing; and practicing self-pacing with activity.             ITP Comments: Pt is making expected progress toward Pulmonary Rehab goals after completing 3 session(s). Recommend continued exercise, life style modification, education, and utilization of breathing  techniques to increase stamina and strength, while also decreasing shortness of breath with exertion.  Dr. Mechele Collin is Medical Director for Pulmonary Rehab at [facility].

## 2023-11-21 ENCOUNTER — Encounter (HOSPITAL_COMMUNITY)
Admission: RE | Admit: 2023-11-21 | Discharge: 2023-11-21 | Disposition: A | Discharge status: Home / Self Care | Payer: PPO | Source: Ambulatory Visit | Attending: Internal Medicine | Admitting: Internal Medicine

## 2023-11-21 DIAGNOSIS — I5022 Chronic systolic (congestive) heart failure: Secondary | ICD-10-CM | POA: Diagnosis not present

## 2023-11-21 NOTE — Progress Notes (Signed)
Daily Session Note  Patient Details  Name: Alan White MRN: [id number] Date of Birth: [birth year] Referring Provider:   Doristine Devoid Pulmonary Rehab Walk Test from 11/06/2023 in [facility] for Heart, Vascular, & Lung Health  Referring Provider Hilty       Encounter Date: 11/21/2023  Check In:  Session Check In - 11/21/23 1022       Check-In   Supervising physician immediately available to respond to emergencies [facility] MD immediately available    Physician(s) Joni Reining, NP    Location [facility]    Staff Present Essie Hart, RN, Doris Cheadle, MS, ACSM-CEP, Exercise Physiologist;Kimya Mccahill Erin Sons BS, ACSM-CEP, Exercise Physiologist    Virtual Visit No    Medication changes reported     No    Fall or balance concerns reported    No    Tobacco Cessation No Change    Warm-up and Cool-down Performed as group-led instruction    Resistance Training Performed Yes    VAD Patient? No    PAD/SET Patient? No      Pain Assessment   Currently in Pain? No/denies    Multiple Pain Sites No             Capillary Blood Glucose: No results found for this or any previous visit (from the past 24 hours).    Social History   Tobacco Use  Smoking Status Former   Current packs/day: 0.00   Average packs/day: 0.5 packs/day for 50.0 years (25.0 ttl pk-yrs)   Types: Cigarettes   Quit date: 10/14/2023   Years since quitting: 0.1  Smokeless Tobacco Never    Goals Met:  Proper associated with RPD/PD & O2 Sat Independence with exercise equipment Exercise tolerated well No report of concerns or symptoms today Strength training completed today  Goals Unmet:  Not Applicable  Comments: Service time is from 1012 to 1135.    Dr. Mechele Collin is Medical Director for Pulmonary Rehab at [facility].

## 2023-11-26 ENCOUNTER — Encounter (HOSPITAL_COMMUNITY)
Admission: RE | Admit: 2023-11-26 | Discharge: 2023-11-26 | Disposition: A | Discharge status: Home / Self Care | Payer: PPO | Source: Ambulatory Visit | Attending: Internal Medicine

## 2023-11-26 DIAGNOSIS — I5022 Chronic systolic (congestive) heart failure: Secondary | ICD-10-CM

## 2023-11-26 DIAGNOSIS — I255 Ischemic cardiomyopathy: Secondary | ICD-10-CM

## 2023-11-26 NOTE — Progress Notes (Signed)
 Daily Session Note  Patient Details  Name: Alan White MRN: [id number] Date of Birth: September 11, 1961 Referring Provider:   Doristine Devoid Pulmonary Rehab Walk Test from 11/06/2023 in [facility] for Heart, Vascular, & Lung Health  Referring Provider Hilty       Encounter Date: 11/26/2023  Check In:  Session Check In - 11/26/23 1122       Check-In   Supervising physician immediately available to respond to emergencies [facility] immediately available    Physician(s) Robin Searing, NP    [facility]    Staff Present Essie Hart, RN, Doris Cheadle, MS, ACSM-CEP, Exercise Physiologist;Jasiel Belisle Chester Holstein, MS, Exercise Physiologist    Virtual Visit No    Medication changes reported     No    Comments reviewed med list    Fall or balance concerns reported    No    Tobacco Cessation No Change    Current number of cigarettes/nicotine per day     8   Pt has reduced to 8/per day   Warm-up and Cool-down Performed as group-led instruction    Resistance Training Performed Yes    VAD Patient? No      Pain Assessment   Currently in Pain? No/denies    Pain Score 0-No pain    Multiple Pain Sites No             Capillary Blood Glucose: No results found for this or any previous visit (from the past 24 hours).    Social History   Tobacco Use  Smoking Status Former   Current packs/day: 0.00   Average packs/day: 0.5 packs/day for 50.0 years (25.0 ttl pk-yrs)   Types: Cigarettes   Quit date: 10/14/2023   Years since quitting: 0.1  Smokeless Tobacco Never    Goals Met:  Proper associated with RPD/PD & O2 Sat Independence with exercise equipment Exercise tolerated well No report of concerns or symptoms today Strength training completed today  Goals Unmet:  Not Applicable  Comments: Service time is from 1020 to 1150.    Dr. Mechele Collin is Medical Director for Pulmonary Rehab at [facility].

## 2023-11-28 ENCOUNTER — Encounter (HOSPITAL_COMMUNITY)
Admission: RE | Admit: 2023-11-28 | Discharge: 2023-11-28 | Disposition: A | Discharge status: Home / Self Care | Payer: PPO | Source: Ambulatory Visit | Attending: Internal Medicine | Admitting: Internal Medicine

## 2023-11-28 DIAGNOSIS — I5022 Chronic systolic (congestive) heart failure: Secondary | ICD-10-CM | POA: Diagnosis not present

## 2023-11-28 NOTE — Progress Notes (Signed)
 Daily Session Note  Patient Details  Name: Alan White MRN: [id number] Date of Birth: Sep 12, 1961 Referring Provider:   Doristine Devoid Pulmonary Rehab Walk Test from 11/06/2023 in [facility] for Heart, Vascular, & Lung Health  Referring Provider Hilty       Encounter Date: 11/28/2023  Check In:  Session Check In - 11/28/23 1112       Check-In   Supervising physician immediately available to respond to emergencies [facility] immediately available    Physician(s) Bernadene Person, NP    [facility]    Staff Present Essie Hart, RN, Doris Cheadle, MS, ACSM-CEP, Exercise Physiologist;Casey Katrinka Blazing, RT;Jetta Walker BS, ACSM-CEP, Exercise Physiologist;Randi Idelle Crouch BS, ACSM-CEP, Exercise Physiologist    Virtual Visit No    Medication changes reported     No    Comments reviewed med list    Fall or balance concerns reported    No    Tobacco Cessation No Change    Current number of cigarettes/nicotine per day     8   Pt has reduced to 8/per day   Warm-up and Cool-down Performed as group-led instruction    Resistance Training Performed Yes    VAD Patient? No    PAD/SET Patient? No      Pain Assessment   Currently in Pain? No/denies    Pain Score 0-No pain    Multiple Pain Sites No             Capillary Blood Glucose: No results found for this or any previous visit (from the past 24 hours).    Social History   Tobacco Use  Smoking Status Former   Current packs/day: 0.00   Average packs/day: 0.5 packs/day for 50.0 years (25.0 ttl pk-yrs)   Types: Cigarettes   Quit date: 10/14/2023   Years since quitting: 0.1  Smokeless Tobacco Never    Goals Met:  Proper associated with RPD/PD & O2 Sat Independence with exercise equipment Exercise tolerated well No report of concerns or symptoms today Strength training completed today  Goals Unmet:  Not Applicable  Comments: Service time is from 1013 to 1144.   Dr. Mechele Collin is  Medical Director for Pulmonary Rehab at [facility].

## 2023-11-29 ENCOUNTER — Telehealth: Payer: Self-pay | Admitting: Internal Medicine

## 2023-11-29 NOTE — Telephone Encounter (Signed)
 Pt c/o medication issue:  1. Name of Medication:   Cholesterol shot  2. How are you currently taking this medication (dosage and times per day)?   3. Are you having a reaction (difficulty breathing--STAT)?   4. What is your medication issue?   Patient stated he is scheduled to have a cholesterol shot and wants to know the name of the medication he will be taking.   Patient stated can send him a message via MyChart.

## 2023-12-03 ENCOUNTER — Encounter (HOSPITAL_COMMUNITY)
Admission: RE | Admit: 2023-12-03 | Discharge: 2023-12-03 | Disposition: A | Discharge status: Home / Self Care | Payer: PPO | Source: Ambulatory Visit | Attending: Internal Medicine | Admitting: Internal Medicine

## 2023-12-03 VITALS — Wt 210.3 lb

## 2023-12-03 DIAGNOSIS — I5022 Chronic systolic (congestive) heart failure: Secondary | ICD-10-CM | POA: Insufficient documentation

## 2023-12-03 DIAGNOSIS — I255 Ischemic cardiomyopathy: Secondary | ICD-10-CM | POA: Diagnosis not present

## 2023-12-03 NOTE — Progress Notes (Signed)
 Daily Session Note  Patient Details  Name: Alan White MRN: [id number] Date of Birth: 05-23-1961 Referring Provider:   Doristine Devoid Pulmonary Rehab Walk Test from 11/06/2023 in [facility] for Heart, Vascular, & Lung Health  Referring Provider Hilty       Encounter Date: 12/03/2023  Check In:  Session Check In - 12/03/23 1011       Check-In   Supervising physician immediately available to respond to emergencies [facility] immediately available    Physician(s) Bernadene Person, NP    [facility]    Staff Present Essie Hart, RN, Doris Cheadle, MS, ACSM-CEP, Exercise Physiologist;Casey Erin Sons BS, ACSM-CEP, Exercise Physiologist;Johnny Hale Bogus, MS, Exercise Physiologist    Virtual Visit No    Medication changes reported     No    Fall or balance concerns reported    No    Tobacco Cessation No Change    Current number of cigarettes/nicotine per day     8   Pt has reduced to 8/per day   Warm-up and Cool-down Performed as group-led instruction    Resistance Training Performed Yes    VAD Patient? No    PAD/SET Patient? No      Pain Assessment   Currently in Pain? No/denies    Pain Score 0-No pain    Multiple Pain Sites No             Capillary Blood Glucose: No results found for this or any previous visit (from the past 24 hours).   Exercise Prescription Changes - 12/03/23 1100       Response to Exercise   Blood Pressure (Admit) 102/60    Blood Pressure (Exercise) 138/72    Blood Pressure (Exit) 94/60    Heart Rate (Admit) 80 bpm    Heart Rate (Exercise) 121 bpm    Heart Rate (Exit) 93 bpm    Oxygen Saturation (Admit) 97 %    Oxygen Saturation (Exercise) 96 %    Oxygen Saturation (Exit) 96 %    Rating of Perceived Exertion (Exercise) 12    Perceived Dyspnea (Exercise) 1    Duration Continue with 30 min of aerobic exercise without signs/symptoms of physical distress.    Intensity THRR unchanged       Progression   Progression Continue to progress workloads to maintain intensity without signs/symptoms of physical distress.      Resistance Training   Training Prescription Yes    Weight black bands    Reps 10-15    Time 10 Minutes      Treadmill   MPH 3    Grade 1.5    Minutes 15    METs 3.5      NuStep   Level 4    SPM 116    Minutes 15    METs 4.6             Social History   Tobacco Use  Smoking Status Former   Current packs/day: 0.00   Average packs/day: 0.5 packs/day for 50.0 years (25.0 ttl pk-yrs)   Types: Cigarettes   Quit date: 10/14/2023   Years since quitting: 0.1  Smokeless Tobacco Never    Goals Met:  Independence with exercise equipment Exercise tolerated well No report of concerns or symptoms today Strength training completed today  Goals Unmet:  Not Applicable  Comments: Service time is from 1013 to 1133.    Dr. Mechele Collin is Medical Director for Pulmonary Rehab at  [facility]

## 2023-12-04 ENCOUNTER — Encounter: Payer: Self-pay | Admitting: Pharmacist Clinician (PhC)/ Clinical Pharmacy Specialist

## 2023-12-04 ENCOUNTER — Telehealth: Payer: Self-pay | Admitting: Pharmacy Technician

## 2023-12-04 ENCOUNTER — Telehealth: Payer: Self-pay | Admitting: Pharmacist Clinician (PhC)/ Clinical Pharmacy Specialist

## 2023-12-04 ENCOUNTER — Ambulatory Visit
Payer: No Typology Code available for payment source | Attending: Cardiology | Admitting: Pharmacist Clinician (PhC)/ Clinical Pharmacy Specialist

## 2023-12-04 ENCOUNTER — Other Ambulatory Visit (HOSPITAL_COMMUNITY): Payer: Self-pay

## 2023-12-04 DIAGNOSIS — E782 Mixed hyperlipidemia: Secondary | ICD-10-CM

## 2023-12-04 NOTE — Telephone Encounter (Signed)
 Please do PA for Repatha

## 2023-12-04 NOTE — Progress Notes (Signed)
 Office Visit    Patient Name: Alan White Date of Encounter: 12/04/2023  Primary Care Provider:  Joycelyn Rua, MD Primary Cardiologist:  Chrystie Nose, MD  Chief Complaint    Hyperlipidemia   Significant Past Medical History   CAD 2015 DES to LCx and LAD(overlapping); 2020 STEMI (after stopping cardiac meds) LAD treated w/angioplasty  CHF HFrEF (11/22 EF 40-45% by echo), on Entresto, metoprolol  preDM2 05/2019 A1c 5.8, nothing more recent    OSA         Allergies  Allergen Reactions   Zetia [Ezetimibe] Shortness Of Breath    Throat swelling   Levaquin [Levofloxacin] Other (See Comments)    Muscle cramps    History of Present Illness    Alan White is a 63 y.o. male patient of Dr Rennis Golden, in the office today to discuss options for cholesterol management.  He was most recently seen by Bettina Gavia PA in January and was noted to have LDL at 78 on atorvastatin 80 mg.    Issues with med compliance in the past, appears compliant based on dispense history  To Alan - quit smoking the day he left your appointment.  Tossed his cigareets in the trash in the parking garage on his way out  Insurance Carrier:  [employer]  LDL Cholesterol goal:  LDL < 55  Current Medications:   atorvastatin 80 mg every day   Previously tried:  ezetimibe - throat swelling  Family Hx:  father had MI, died at 42, mother died cancer, brother died MI at 43, youngest brother had stents at 63, sister had stroke at 49 (doing well now at 34, DM); 4 kids, oldest 70, all healthy   Social Hx: Tobacco:  QUIT ON JANUARY 53!!!  (After Alan White appointment!) Alcohol: no   Diet:  more home cooked, eats out 1-2 times per week; cereal for breakfast; variety of meats, vegetables, no added salt; occasional chips or cookie, but not regularly  Exercise: pulmonary rehab 2 days per week, 2 days at home also, trying to work up slowly  Contact/pharmacy:  MyChart // [facility] 90 day  supply  Accessory Clinical Findings   Lab Results  Component Value Date   CHOL 131 08/05/2023   HDL 33 (L) 08/05/2023   LDLCALC 78 08/05/2023   TRIG 105 08/05/2023   CHOLHDL 4.0 08/05/2023    No results found for: "LIPOA"  Lab Results  Component Value Date   ALT 11 11/14/2021   AST 15 11/14/2021   ALKPHOS 141 (H) 11/14/2021   BILITOT 0.4 11/14/2021   Lab Results  Component Value Date   CREATININE 1.24 06/07/2023   BUN 10 06/07/2023   NA 141 06/07/2023   K 3.6 06/07/2023   CL 103 06/07/2023   CO2 29 06/07/2023   Lab Results  Component Value Date   HGBA1C 5.8 (H) 05/19/2019    Home Medications    Current Outpatient Medications  Medication Sig Dispense Refill   aspirin EC 81 MG tablet Take 1 tablet (81 mg total) by mouth daily. 30 tablet 11   atorvastatin (LIPITOR) 80 MG tablet Take 1 tablet (80 mg total) by mouth daily. 90 tablet 2   clopidogrel (PLAVIX) 75 MG tablet Take 1 tablet (75 mg total) by mouth daily. 90 tablet 2   isosorbide mononitrate (IMDUR) 30 MG 24 hr tablet Take 1 tablet (30 mg total) by mouth daily. 90 tablet 3   metoprolol succinate (TOPROL XL) 25 MG 24 hr tablet  Take 0.5 tablets (12.5 mg total) by mouth daily. 90 tablet 3   nitroGLYCERIN (NITROSTAT) 0.4 MG SL tablet Place 1 tablet (0.4 mg total) under the tongue every 5 (five) minutes x 3 doses as needed for chest pain. 25 tablet 12   pantoprazole (PROTONIX) 40 MG tablet Take 1 tablet (40 mg total) by mouth daily. 90 tablet 3   sacubitril-valsartan (ENTRESTO) 24-26 MG Take 1 tablet by mouth 2 (two) times daily. 180 tablet 3   No current facility-administered medications for this visit.     Assessment & Plan    Hyperlipidemia Assessment: Patient with ASCVD not at LDL goal of < 55 Most recent LDL 78 on 08/2023 Has been compliant with high intensity statin : atorvastatin 80 Allergic reaction to ezetimibe  Reviewed options for lowering LDL cholesterol, specifically PCSK-9 inhibitors.  Discussed  mechanisms of action, dosing, side effects, potential decreases in LDL cholesterol and costs.  Also reviewed potential options for patient assistance.  Plan: Patient agreeable to starting Repatha 140 mg q14d Repeat labs after:  3 months Lipid Liver function Patient aware to reach out should medication be cost prohibitive.     Phillips Hay, PharmD CPP [facility] [address]  [place], [place] [address] [phone]  12/04/2023, 10:17 AM

## 2023-12-04 NOTE — Assessment & Plan Note (Signed)
 Assessment: Patient with ASCVD not at LDL goal of < 55 Most recent LDL 78 on 08/2023 Has been compliant with high intensity statin : atorvastatin 80 Allergic reaction to ezetimibe  Reviewed options for lowering LDL cholesterol, specifically PCSK-9 inhibitors.  Discussed mechanisms of action, dosing, side effects, potential decreases in LDL cholesterol and costs.  Also reviewed potential options for patient assistance.  Plan: Patient agreeable to starting Repatha 140 mg q14d Repeat labs after:  3 months Lipid Liver function Patient aware to reach out should medication be cost prohibitive.

## 2023-12-04 NOTE — Patient Instructions (Signed)
 Your Results:             Your most recent labs Goal  Total Cholesterol 131 < 200  Triglycerides 105 < 150  HDL (happy/good cholesterol) 33 > 40  LDL (lousy/bad cholesterol 78 < 55   Medication changes:  We will start the process to get Repatha covered by your insurance.  Once the prior authorization is complete, I will call/send a MyChart message to let you know and confirm pharmacy information.   You will take one injection every 14 days.   Lab orders:  We want to repeat labs after 2-3 months.  We will send you a lab order to remind you once we get closer to that time.      Thank you for choosing [facility]  Phillips Hay PharmD

## 2023-12-04 NOTE — Telephone Encounter (Signed)
 Pharmacy Patient Advocate Encounter   Received notification from Pt Calls Messages that prior authorization for repatha is required/requested.   Insurance verification completed.   The patient is insured through [facility] ADVANTAGE/RX ADVANCE .   Per test claim: PA required; PA submitted to above mentioned insurance via CoverMyMeds Key/confirmation #/EOC [id number] Status is pending

## 2023-12-05 ENCOUNTER — Other Ambulatory Visit (HOSPITAL_COMMUNITY): Payer: Self-pay

## 2023-12-05 ENCOUNTER — Encounter (HOSPITAL_COMMUNITY)
Admission: RE | Admit: 2023-12-05 | Discharge: 2023-12-05 | Disposition: A | Discharge status: Home / Self Care | Payer: PPO | Source: Ambulatory Visit | Attending: Internal Medicine | Admitting: Internal Medicine

## 2023-12-05 DIAGNOSIS — I5022 Chronic systolic (congestive) heart failure: Secondary | ICD-10-CM | POA: Diagnosis not present

## 2023-12-05 MED ORDER — REPATHA SURECLICK 140 MG/ML ~~LOC~~ SOAJ: 140.0000 mg | SUBCUTANEOUS | 3 refills | Status: DC

## 2023-12-05 NOTE — Telephone Encounter (Signed)
 Pharmacy Patient Advocate Encounter  Received notification from [facility] ADVANTAGE/RX ADVANCE that Prior Authorization for REPATHA has been APPROVED from 12/05/23 to 06/06/24. Ran test claim, Copay is $47.00. This test claim was processed through [facility]- copay amounts may vary at other pharmacies due to pharmacy/plan contracts, or as the patient moves through the different stages of their insurance plan.   PA #/Case ID/Reference #: [id number]

## 2023-12-05 NOTE — Addendum Note (Signed)
 Addended by: Rosalee Kaufman on: 12/05/2023 02:09 PM   Modules accepted: Orders

## 2023-12-05 NOTE — Progress Notes (Signed)
 Daily Session Note  Patient Details  Name: Alan White MRN: [id number] Date of Birth: September 11, 1961 Referring Provider:   Doristine Devoid Pulmonary Rehab Walk Test from 11/06/2023 in [facility] for Heart, Vascular, & Lung Health  Referring Provider Hilty       Encounter Date: 12/05/2023  Check In:  Session Check In - 12/05/23 1032       Check-In   Supervising physician immediately available to respond to emergencies [facility] immediately available    Physician(s) Alden Server Dick,nP    [facility]    Staff Present Essie Hart, RN, Doris Cheadle, MS, ACSM-CEP, Exercise Physiologist;Casey Erin Sons BS, ACSM-CEP, Exercise Physiologist    Virtual Visit No    Medication changes reported     No    Fall or balance concerns reported    No    Tobacco Cessation No Change    Warm-up and Cool-down Performed as group-led instruction    Resistance Training Performed Yes    VAD Patient? No    PAD/SET Patient? No      Pain Assessment   Currently in Pain? No/denies             Capillary Blood Glucose: No results found for this or any previous visit (from the past 24 hours).    Social History   Tobacco Use  Smoking Status Former   Current packs/day: 0.00   Average packs/day: 0.5 packs/day for 50.0 years (25.0 ttl pk-yrs)   Types: Cigarettes   Quit date: 10/14/2023   Years since quitting: 0.1  Smokeless Tobacco Never    Goals Met:  Independence with exercise equipment Exercise tolerated well No report of concerns or symptoms today Strength training completed today  Goals Unmet:  Not Applicable  Comments: Service time is from 1011 to 1130    Dr. Mechele Collin is Medical Director for Pulmonary Rehab at [facility].

## 2023-12-10 ENCOUNTER — Encounter (HOSPITAL_COMMUNITY)
Admission: RE | Admit: 2023-12-10 | Discharge: 2023-12-10 | Disposition: A | Discharge status: Home / Self Care | Payer: PPO | Source: Ambulatory Visit | Attending: Internal Medicine | Admitting: Internal Medicine

## 2023-12-10 DIAGNOSIS — I255 Ischemic cardiomyopathy: Secondary | ICD-10-CM

## 2023-12-10 DIAGNOSIS — I5022 Chronic systolic (congestive) heart failure: Secondary | ICD-10-CM | POA: Diagnosis not present

## 2023-12-10 NOTE — Progress Notes (Signed)
 Daily Session Note  Patient Details  Name: Alan White MRN: [id number] Date of Birth: 1961-05-08 Referring Provider:   Doristine Devoid Pulmonary Rehab Walk Test from 11/06/2023 in [facility] for Heart, Vascular, & Lung Health  Referring Provider Hilty       Encounter Date: 12/10/2023  Check In:  Session Check In - 12/10/23 1000       Check-In   Supervising physician immediately available to respond to emergencies [facility] immediately available    Physician(s) Tereso Newcomer, PA    [facility]    Staff Present Raford Pitcher, MS, ACSM-CEP, Exercise Physiologist;Markanthony Gedney Erin Sons BS, ACSM-CEP, Exercise Physiologist;Johnny Hale Bogus, MS, Exercise Physiologist    Virtual Visit No    Medication changes reported     No    Fall or balance concerns reported    No    Tobacco Cessation No Change    Warm-up and Cool-down Performed as group-led instruction    Resistance Training Performed Yes    VAD Patient? No    PAD/SET Patient? No      Pain Assessment   Currently in Pain? No/denies    Pain Score 0-No pain    Multiple Pain Sites No             Capillary Blood Glucose: No results found for this or any previous visit (from the past 24 hours).    Social History   Tobacco Use  Smoking Status Former   Current packs/day: 0.00   Average packs/day: 0.5 packs/day for 50.0 years (25.0 ttl pk-yrs)   Types: Cigarettes   Quit date: 10/14/2023   Years since quitting: 0.1  Smokeless Tobacco Never    Goals Met:  Proper associated with RPD/PD & O2 Sat Independence with exercise equipment Exercise tolerated well No report of concerns or symptoms today Strength training completed today  Goals Unmet:  Not Applicable  Comments: Service time is from 1009 to 1124.    Dr. Mechele Collin is Medical Director for Pulmonary Rehab at [facility].

## 2023-12-12 ENCOUNTER — Encounter (HOSPITAL_COMMUNITY)
Admission: RE | Admit: 2023-12-12 | Discharge: 2023-12-12 | Disposition: A | Discharge status: Home / Self Care | Payer: PPO | Source: Ambulatory Visit | Attending: Internal Medicine | Admitting: Internal Medicine

## 2023-12-12 VITALS — Wt 213.0 lb

## 2023-12-12 DIAGNOSIS — I5022 Chronic systolic (congestive) heart failure: Secondary | ICD-10-CM

## 2023-12-12 NOTE — Progress Notes (Signed)
 Daily Session Note  Patient Details  Name: Alan White MRN: [id number] Date of Birth: 03-27-1961 Referring Provider:   Doristine Devoid Pulmonary Rehab Walk Test from 11/06/2023 in [facility]  Referring Provider Hilty       Encounter Date: 12/12/2023  Check In:  Session Check In - 12/12/23 1120       Check-In   Supervising physician immediately available to respond to emergencies [facility] MD immediately available    Physician(s) Edd Fabian, NP    Location [facility]    Staff Present Raford Pitcher, MS, ACSM-CEP, Exercise Physiologist;Alayia Meggison Erin Sons BS, ACSM-CEP, Exercise Physiologist;Johnny Hale Bogus, MS, Exercise Physiologist    Virtual Visit No    Medication changes reported     No    Fall or balance concerns reported    No    Tobacco Cessation No Change    Warm-up and Cool-down Performed as group-led instruction    Resistance Training Performed Yes    VAD Patient? No    PAD/SET Patient? No      Pain Assessment   Currently in Pain? No/denies             Capillary Blood Glucose: No results found for this or any previous visit (from the past 24 hours).    Social History   Tobacco Use  Smoking Status Former   Current packs/day: 0.00   Average packs/day: 0.5 packs/day for 50.0 years (25.0 ttl pk-yrs)   Types: Cigarettes   Quit date: 10/14/2023   Years since quitting: 0.1  Smokeless Tobacco Never    Goals Met:  Proper associated with RPD/PD & O2 Sat Independence with exercise equipment Exercise tolerated well No report of concerns or symptoms today Strength training completed today  Goals Unmet:  Not Applicable  Comments: Service time is from 1017 to 1140.    Dr. Mechele Collin is Medical Director for Pulmonary Rehab at [facility].

## 2023-12-17 ENCOUNTER — Encounter (HOSPITAL_COMMUNITY): Admission: RE | Admit: 2023-12-17 | Payer: PPO | Source: Ambulatory Visit

## 2023-12-17 ENCOUNTER — Telehealth (HOSPITAL_COMMUNITY): Payer: Self-pay

## 2023-12-17 NOTE — Telephone Encounter (Signed)
 Called pt to check on him after missing Pulm Rehab today. Pt stated he had a reaction to his repatha injection. Advised him to call his cardiologist and/or cardiology pharmacist for advice. Pt verbalizes understanding and agrees with plan. Pt stated that he will be back at [facility] on Thursday

## 2023-12-18 DIAGNOSIS — D492 Neoplasm of unspecified behavior of bone, soft tissue, and skin: Secondary | ICD-10-CM | POA: Diagnosis not present

## 2023-12-18 DIAGNOSIS — C44629 Squamous cell carcinoma of skin of left upper limb, including shoulder: Secondary | ICD-10-CM | POA: Diagnosis not present

## 2023-12-18 NOTE — Progress Notes (Signed)
 Pulmonary Individual Treatment Plan  Patient Details  Name: Alan White MRN: [id number] Date of Birth: [birth year] Referring Provider:   Doristine Devoid Pulmonary Rehab Walk Test from 11/06/2023 in [facility]  Referring Provider Hilty       Initial Encounter Date:  Flowsheet Row Pulmonary Rehab Walk Test from 11/06/2023 in [facility]  Date 11/06/23       Visit Diagnosis: Heart failure, chronic systolic (HCC)  Patient's Home Medications on Admission:   Current Outpatient Medications:    aspirin EC 81 MG tablet, Take 1 tablet (81 mg total) by mouth daily., Disp: 30 tablet, Rfl: 11   atorvastatin (LIPITOR) 80 MG tablet, Take 1 tablet (80 mg total) by mouth daily., Disp: 90 tablet, Rfl: 2   clopidogrel (PLAVIX) 75 MG tablet, Take 1 tablet (75 mg total) by mouth daily., Disp: 90 tablet, Rfl: 2   Evolocumab (REPATHA SURECLICK) 140 MG/ML SOAJ, Inject 140 mg into the skin every 14 (fourteen) days., Disp: 6 mL, Rfl: 3   isosorbide mononitrate (IMDUR) 30 MG 24 hr tablet, Take 1 tablet (30 mg total) by mouth daily., Disp: 90 tablet, Rfl: 3   metoprolol succinate (TOPROL XL) 25 MG 24 hr tablet, Take 0.5 tablets (12.5 mg total) by mouth daily., Disp: 90 tablet, Rfl: 3   nitroGLYCERIN (NITROSTAT) 0.4 MG SL tablet, Place 1 tablet (0.4 mg total) under the tongue every 5 (five) minutes x 3 doses as needed for chest pain., Disp: 25 tablet, Rfl: 12   pantoprazole (PROTONIX) 40 MG tablet, Take 1 tablet (40 mg total) by mouth daily., Disp: 90 tablet, Rfl: 3   sacubitril-valsartan (ENTRESTO) 24-26 MG, Take 1 tablet by mouth 2 (two) times daily., Disp: 180 tablet, Rfl: 3  Past Medical History: Past Medical History:  Diagnosis Date   AICD (automatic cardioverter/defibrillator) present    [employer]) [employer] ICD   CAD (coronary artery disease), 12/29/13 PCI/DES LCX and PCI/DES LAD  with overlapping DES  12/30/2013   cath 07/05/14 OK   CHF (congestive heart failure) (HCC)    Dyslipidemia, goal LDL below 70 12/30/2013   GERD (gastroesophageal reflux disease)    History of kidney stones    Metabolic syndrome, HgbA1C 6.0  12/30/2013   Myocardial infarction [facility]) 2015,2020   Smoker    Wears partial dentures    upper    Tobacco Use: Social History   Tobacco Use  Smoking Status Former   Current packs/day: 0.00   Average packs/day: 0.5 packs/day for 50.0 years (25.0 ttl pk-yrs)   Types: Cigarettes   Quit date: 10/14/2023   Years since quitting: 0.1  Smokeless Tobacco Never    Labs: Review Flowsheet  More data exists      Latest Ref Rng & Units 01/31/2019 05/19/2019 04/20/2020 08/02/2020 08/05/2023  Labs for ITP Cardiac and Pulmonary Rehab  Cholestrol 100 - 199 mg/dL - 409  811  - 914   LDL (calc) 0 - 99 mg/dL - 60  67  - 78   HDL-C >39 mg/dL - 28  32  - 33   Trlycerides 0 - 149 mg/dL - 782  95  - 956   Hemoglobin A1c 4.8 - 5.6 % - 5.8  - - -  PH, Arterial 7.350 - 7.450 7.149  - - - -  PCO2 arterial 32.0 - 48.0 mmHg 47.6  - - - -  Bicarbonate 20.0 - 28.0 mmol/L  16.5  - - - -  TCO2 22 - 32 mmol/L 18  - - 24  -  Acid-base deficit 0.0 - 2.0 mmol/L 12.0  - - - -  O2 Saturation % 81.0  - - - -    Capillary Blood Glucose: Lab Results  Component Value Date   GLUCAP 193 (H) 08/02/2020   GLUCAP 181 (H) 08/02/2020   GLUCAP 130 (H) 08/02/2020     Pulmonary Assessment Scores:  Pulmonary Assessment Scores     Row Name 11/06/23 1035         ADL UCSD   ADL Phase Entry     SOB Score total 13       CAT Score   CAT Score 10       mMRC Score   mMRC Score 1             UCSD: Self-administered rating of dyspnea associated with activities of daily living (ADLs) 6-point scale (0 = "not at all" to 5 = "maximal or unable to do because of breathlessness")  Scoring Scores range from 0 to 120.  Minimally important difference is 5 units  CAT: CAT can  identify the health impairment of COPD patients and is better correlated with disease progression.  CAT has a scoring range of zero to 40. The CAT score is classified into four groups of low (less than 10), medium (10 - 20), high (21-30) and very high (31-40) based on the impact level of disease on health status. A CAT score over 10 suggests significant symptoms.  A worsening CAT score could be explained by an exacerbation, poor medication adherence, poor inhaler technique, or progression of COPD or comorbid conditions.  CAT MCID is 2 points  mMRC: mMRC (Modified Medical Research Council) Dyspnea Scale is used to assess the degree of baseline functional disability in patients of respiratory disease due to dyspnea. No minimal important difference is established. A decrease in score of 1 point or greater is considered a positive change.   Pulmonary Function Assessment:  Pulmonary Function Assessment - 11/06/23 1106       Breath   Bilateral Breath Sounds Clear    Shortness of Breath Yes;Limiting activity             Exercise Target Goals: Exercise Program Goal: Individual exercise prescription set using results from initial 6 min walk test and THRR while considering  patient's activity barriers and safety.   Exercise Prescription Goal: Initial exercise prescription builds to 30-45 minutes a day of aerobic activity, 2-3 days per week.  Home exercise guidelines will be given to patient during program as part of exercise prescription that the participant will acknowledge.  Activity Barriers & Risk Stratification:  Activity Barriers & Cardiac Risk Stratification - 11/06/23 1027       Activity Barriers & Cardiac Risk Stratification   Activity Barriers None             6 Minute Walk:  6 Minute Walk     Row Name 11/06/23 1141         6 Minute Walk   Phase Initial     Distance 1820 feet     Walk Time 6 minutes     # of Rest Breaks 0     MPH 3.45     METS 3.95     RPE 9      Perceived Dyspnea  0.5     VO2 Peak 13.84     Symptoms No  Resting HR 81 bpm     Resting BP 120/60     Resting Oxygen Saturation  95 %     Exercise Oxygen Saturation  during 6 min walk 94 %     Max Ex. HR 97 bpm     Max Ex. BP 110/64     2 Minute Post BP 108/64       Interval HR   1 Minute HR 83     2 Minute HR 83     3 Minute HR 97     4 Minute HR 97     5 Minute HR 97     6 Minute HR 97     2 Minute Post HR 92     Interval Heart Rate? Yes       Interval Oxygen   Interval Oxygen? Yes     Baseline Oxygen Saturation % 95 %     1 Minute Oxygen Saturation % 96 %     1 Minute Liters of Oxygen 0 L     2 Minute Oxygen Saturation % 94 %     2 Minute Liters of Oxygen 0 L     3 Minute Oxygen Saturation % 94 %     3 Minute Liters of Oxygen 0 L     4 Minute Oxygen Saturation % 94 %     4 Minute Liters of Oxygen 0 L     5 Minute Oxygen Saturation % 95 %     5 Minute Liters of Oxygen 0 L     6 Minute Oxygen Saturation % 95 %     6 Minute Liters of Oxygen 0 L     2 Minute Post Oxygen Saturation % 97 %     2 Minute Post Liters of Oxygen 0 L              Oxygen Initial Assessment:  Oxygen Initial Assessment - 11/06/23 1028       Home Oxygen   Home Oxygen Device None    Sleep Oxygen Prescription None    Home Exercise Oxygen Prescription None    Home Resting Oxygen Prescription None      Initial 6 min Walk   Oxygen Used None      Program Oxygen Prescription   Program Oxygen Prescription None      Intervention   Short Term Goals To learn and understand importance of maintaining oxygen saturations>88%;To learn and understand importance of monitoring SPO2 with pulse oximeter and demonstrate accurate use of the pulse oximeter.;To learn and demonstrate proper pursed lip breathing techniques or other breathing techniques.     Long  Term Goals Exhibits proper breathing techniques, such as pursed lip breathing or other method taught during program session;Verbalizes  importance of monitoring SPO2 with pulse oximeter and return demonstration;Maintenance of O2 saturations>88%             Oxygen Re-Evaluation:  Oxygen Re-Evaluation     Row Name 11/06/23 1028 12/16/23 0902           Program Oxygen Prescription   Program Oxygen Prescription -- None        Home Oxygen   Home Oxygen Device -- None      Sleep Oxygen Prescription -- None      Home Exercise Oxygen Prescription -- None      Home Resting Oxygen Prescription -- None        Goals/Expected Outcomes   Short Term Goals -- To learn and  understand importance of maintaining oxygen saturations>88%;To learn and understand importance of monitoring SPO2 with pulse oximeter and demonstrate accurate use of the pulse oximeter.;To learn and demonstrate proper pursed lip breathing techniques or other breathing techniques.       Long  Term Goals -- Exhibits proper breathing techniques, such as pursed lip breathing or other method taught during program session;Verbalizes importance of monitoring SPO2 with pulse oximeter and return demonstration;Maintenance of O2 saturations>88%      Goals/Expected Outcomes Compliance and understanding of oxygen saturation and breathing techniques to decrease shortness of breath. Compliance and understanding of oxygen saturation and breathing techniques to decrease shortness of breath.               Oxygen Discharge (Final Oxygen Re-Evaluation):  Oxygen Re-Evaluation - 12/16/23 0902       Program Oxygen Prescription   Program Oxygen Prescription None      Home Oxygen   Home Oxygen Device None    Sleep Oxygen Prescription None    Home Exercise Oxygen Prescription None    Home Resting Oxygen Prescription None      Goals/Expected Outcomes   Short Term Goals To learn and understand importance of maintaining oxygen saturations>88%;To learn and understand importance of monitoring SPO2 with pulse oximeter and demonstrate accurate use of the pulse oximeter.;To learn  and demonstrate proper pursed lip breathing techniques or other breathing techniques.     Long  Term Goals Exhibits proper breathing techniques, such as pursed lip breathing or other method taught during program session;Verbalizes importance of monitoring SPO2 with pulse oximeter and return demonstration;Maintenance of O2 saturations>88%    Goals/Expected Outcomes Compliance and understanding of oxygen saturation and breathing techniques to decrease shortness of breath.             Initial Exercise Prescription:  Initial Exercise Prescription - 11/06/23 1100       Date of Initial Exercise RX and Referring Provider   Date 11/06/23    Referring Provider Hilty    Expected Discharge Date 01/30/24      Treadmill   MPH 2.7    Grade 1    Minutes 15    METs 3.9      NuStep   Level 3    SPM 70    Minutes 15    METs 3.5      Prescription Details   Frequency (times per week) 2    Duration Progress to 30 minutes of continuous aerobic without signs/symptoms of physical distress      Intensity   THRR 40-80% of Max Heartrate 62-125    Ratings of Perceived Exertion 11-13    Perceived Dyspnea 0-4      Progression   Progression Continue to progress workloads to maintain intensity without signs/symptoms of physical distress.      Resistance Training   Training Prescription Yes    Weight --   black bands   Reps 10-15             Perform Capillary Blood Glucose checks as needed.  Exercise Prescription Changes:   Exercise Prescription Changes     Row Name 11/19/23 1200 12/03/23 1100 12/12/23 1023         Response to Exercise   Blood Pressure (Admit) 110/58 102/60 113/74     Blood Pressure (Exercise) 132/76 138/72 --     Blood Pressure (Exit) 102/64 94/60 100/60     Heart Rate (Admit) 70 bpm 80 bpm 74 bpm     Heart Rate (  Exercise) 119 bpm 121 bpm 110 bpm     Heart Rate (Exit) 86 bpm 93 bpm 74 bpm     Oxygen Saturation (Admit) 97 % 97 % 98 %     Oxygen Saturation  (Exercise) 95 % 96 % 95 %     Oxygen Saturation (Exit) 95 % 96 % 96 %     Rating of Perceived Exertion (Exercise) 12 12 12      Perceived Dyspnea (Exercise) 1 1 1      Duration Progress to 30 minutes of  aerobic without signs/symptoms of physical distress Continue with 30 min of aerobic exercise without signs/symptoms of physical distress. Continue with 30 min of aerobic exercise without signs/symptoms of physical distress.     Intensity THRR unchanged THRR unchanged THRR unchanged       Progression   Progression Continue to progress workloads to maintain intensity without signs/symptoms of physical distress. Continue to progress workloads to maintain intensity without signs/symptoms of physical distress. Continue to progress workloads to maintain intensity without signs/symptoms of physical distress.       Resistance Training   Training Prescription -- Yes Yes     Weight -- black bands black bands     Reps -- 10-15 10-15     Time -- 10 Minutes 10 Minutes       Treadmill   MPH 3 3 3      Grade 1.5 1.5 1.5     Minutes 15 15 15      METs 3.5 3.5 3.5       NuStep   Level 4 4 4      SPM 116 116 115     Minutes 15 15 15      METs 4.8 4.6 4.4              Exercise Comments:   Exercise Comments     Row Name 11/12/23 1155           Exercise Comments Alan White completed his first day of exercise. He exercised for 15 min on the Nustep and treadmill. Kelly averaged 4.4 METs at level 3 on the Nustep and 4.3 METs at 3.5 mph and 1.5% on the treadmill. Kelly performed the warmup and cooldown standing without limitations. Discussed METs.                Exercise Goals and Review:   Exercise Goals     Row Name 11/06/23 1027             Exercise Goals   Increase Physical Activity Yes       Intervention Provide advice, education, support and counseling about physical activity/exercise needs.;Develop an individualized exercise prescription for aerobic and resistive training based on  initial evaluation findings, risk stratification, comorbidities and participant's personal goals.       Expected Outcomes Short Term: Attend rehab on a regular basis to increase amount of physical activity.;Long Term: Exercising regularly at least 3-5 days a week.;Long Term: Add in home exercise to make exercise part of routine and to increase amount of physical activity.       Increase Strength and Stamina Yes       Intervention Provide advice, education, support and counseling about physical activity/exercise needs.;Develop an individualized exercise prescription for aerobic and resistive training based on initial evaluation findings, risk stratification, comorbidities and participant's personal goals.       Expected Outcomes Short Term: Increase workloads from initial exercise prescription for resistance, speed, and METs.;Short Term: Perform resistance training exercises routinely  during rehab and add in resistance training at home;Long Term: Improve cardiorespiratory fitness, muscular endurance and strength as measured by increased METs and functional capacity ( )       Able to understand and use rate of perceived exertion (RPE) scale Yes       Intervention Provide education and explanation on how to use RPE scale       Expected Outcomes Short Term: Able to use RPE daily in rehab to express subjective intensity level;Long Term:  Able to use RPE to guide intensity level when exercising independently       Able to understand and use Dyspnea scale Yes       Intervention Provide education and explanation on how to use Dyspnea scale       Expected Outcomes Short Term: Able to use Dyspnea scale daily in rehab to express subjective sense of shortness of breath during exertion;Long Term: Able to use Dyspnea scale to guide intensity level when exercising independently       Able to check pulse independently Yes       Intervention Provide education and demonstration on how to check pulse in carotid and  radial arteries.;Review the importance of being able to check your own pulse for safety during independent exercise       Expected Outcomes Short Term: Able to explain why pulse checking is important during independent exercise;Long Term: Able to check pulse independently and accurately       Understanding of Exercise Prescription Yes       Intervention Provide education, explanation, and written materials on patient's individual exercise prescription       Expected Outcomes Short Term: Able to explain program exercise prescription;Long Term: Able to explain home exercise prescription to exercise independently                Exercise Goals Re-Evaluation :  Exercise Goals Re-Evaluation     Row Name 11/11/23 1147 12/16/23 0900           Exercise Goal Re-Evaluation   Exercise Goals Review Increase Physical Activity;Able to understand and use Dyspnea scale;Understanding of Exercise Prescription;Increase Strength and Stamina;Knowledge and understanding of Target Heart Rate Range (THRR);Able to understand and use rate of perceived exertion (RPE) scale Increase Physical Activity;Able to understand and use Dyspnea scale;Understanding of Exercise Prescription;Increase Strength and Stamina;Knowledge and understanding of Target Heart Rate Range (THRR);Able to understand and use rate of perceived exertion (RPE) scale      Comments Alan White is scheduled to start Pulmonary Rehab on 2/11. Will continue to monitor and progress as able. Alan White has completed10 exercise sessions. He exercises for 15 min on the Nustep and treadmill. Kelly averages 4.4 METs at level 4 on the Nustep and 3.5 at 3.0 mph and 1.5% incline on the treadmill. Alan White has increased his level and speed and incline on the treadmill. His METs have also increased. Alan White seems motivated to exercise and improve his functional capacity. Will continue to monitor and progress as able.      Expected Outcomes Through exercise at rehab and home, the patient  will decrease shortness of breath and feel confident in carrying out an exercise regimen at home. Through exercise at rehab and home, the patient will decrease shortness of breath and feel confident in carrying out an exercise regimen at home.               Discharge Exercise Prescription (Final Exercise Prescription Changes):  Exercise Prescription Changes - 12/12/23 1023  Response to Exercise   Blood Pressure (Admit) 113/74    Blood Pressure (Exit) 100/60    Heart Rate (Admit) 74 bpm    Heart Rate (Exercise) 110 bpm    Heart Rate (Exit) 74 bpm    Oxygen Saturation (Admit) 98 %    Oxygen Saturation (Exercise) 95 %    Oxygen Saturation (Exit) 96 %    Rating of Perceived Exertion (Exercise) 12    Perceived Dyspnea (Exercise) 1    Duration Continue with 30 min of aerobic exercise without signs/symptoms of physical distress.    Intensity THRR unchanged      Progression   Progression Continue to progress workloads to maintain intensity without signs/symptoms of physical distress.      Resistance Training   Training Prescription Yes    Weight black bands    Reps 10-15    Time 10 Minutes      Treadmill   MPH 3    Grade 1.5    Minutes 15    METs 3.5      NuStep   Level 4    SPM 115    Minutes 15    METs 4.4             Nutrition:  Target Goals: Understanding of nutrition guidelines, daily intake of sodium 1500mg , cholesterol 200mg , calories 30% from fat and 7% or less from saturated fats, daily to have 5 or more servings of fruits and vegetables.  Biometrics:  Pre Biometrics - 11/06/23 1149       Pre Biometrics   Grip Strength 36 kg              Nutrition Therapy Plan and Nutrition Goals:  Nutrition Therapy & Goals - 12/10/23 1024       Nutrition Therapy   Diet Heart Healthy Diet    Drug/Food Interactions Statins/Certain Fruits      Personal Nutrition Goals   Nutrition Goal Patient to improve diet quality by using the plate method as a  guide for meal planning to include lean protein/plant protein, fruits, vegetables, whole grains, nonfat dairy as part of a well-balanced diet.   goal in progress   Personal Goal #2 Patient to identify food sources and limit daily intake of saturated fat, trans fat, sodium, and refined carbohydrates.   goal in progress.   Personal Goal #3 Patient to reduce sodium intake to 1500mg  per day   goal in progress.   Comments Goals in action. Alan White was previously enrolled in intensive cardiac rehab in June of 2024 but was unable to finish the program due to gallbladder surgery. He has started making many dietary changes including reduced mountain dew from 5/day to 3/day, drastically reduced fried foods and decreased portions of meat, reduced snacking on refined carbohydrates (chips) and switched to fruit, and increased non-starchy vegetables at dinner daily. Alan White remains motivated to continue to improve food choices. He quit smoking in January 2025. He started repatha for improved lipid control.  [facility] Alan White will benefit from participation in pulmonary rehab for nutrition, exercise, and lifestyle modification.      Intervention Plan   Intervention Prescribe, educate and counsel regarding individualized specific dietary modifications aiming towards targeted core components such as weight, hypertension, lipid management, diabetes, heart failure and other comorbidities.;Nutrition handout(s) given to patient.    Expected Outcomes Short Term Goal: Understand basic principles of dietary content, such as calories, fat, sodium, cholesterol and nutrients.;Long Term Goal: Adherence  to prescribed nutrition plan.             Nutrition Assessments:  MEDIFICTS Score Key: >=70 Need to make dietary changes  40-70 Heart Healthy Diet <= 40 Therapeutic Level Cholesterol Diet  Flowsheet Row INTENSIVE CARDIAC REHAB from [facility]  Picture Your Plate Total Score on Admission 40      Picture Your Plate Scores: <91 Unhealthy dietary pattern with much room for improvement. 41-50 Dietary pattern unlikely to meet recommendations for good health and room for improvement. 51-60 More healthful dietary pattern, with some room for improvement.  >60 Healthy dietary pattern, although there may be some specific behaviors that could be improved.    Nutrition Goals Re-Evaluation:  Nutrition Goals Re-Evaluation     Row Name 12/10/23 1024             Goals   Current Weight 214 lb 4.6 oz (97.2 kg)       Comment LDL 78, HDL 33       Expected Outcome Goals in action. Alan White was previously enrolled in intensive cardiac rehab in June of 2024 but was unable to finish the program due to gallbladder surgery. He has started making many dietary changes including reduced mountain dew from 5/day to 3/day, drastically reduced fried foods and decreased portions of meat, reduced snacking on refined carbohydrates (chips) and switched to fruit, and increased non-starchy vegetables at dinner daily. Alan White remains motivated to continue to improve food choices. He quit smoking in January 2025. He started repatha for improved lipid control. [facility] is [facility]. Alan White will benefit from participation in pulmonary rehab for nutrition, exercise, and lifestyle modification.                Nutrition Goals Discharge (Final Nutrition Goals Re-Evaluation):  Nutrition Goals Re-Evaluation - 12/10/23 1024       Goals   Current Weight 214 lb 4.6 oz (97.2 kg)    Comment LDL 78, HDL 33    Expected Outcome Goals in action. Alan White was previously enrolled in intensive cardiac rehab in June of 2024 but was unable to finish the program due to gallbladder surgery. He has started making many dietary changes including reduced mountain dew from 5/day to 3/day, drastically reduced fried  foods and decreased portions of meat, reduced snacking on refined carbohydrates (chips) and switched to fruit, and increased non-starchy vegetables at dinner daily. Alan White remains motivated to continue to improve food choices. He quit smoking in January 2025. He started repatha for improved lipid control. [facility] is [facility]. Alan White will benefit from participation in pulmonary rehab for nutrition, exercise, and lifestyle modification.             Psychosocial: Target Goals: Acknowledge presence or absence of significant depression and/or stress, maximize coping skills, provide positive support system. Participant is able to verbalize types and ability to use techniques and skills needed for reducing stress and depression.  Initial Review & Psychosocial Screening:  Initial Psych Review & Screening - 11/06/23 1029       Initial Review   Current issues with None Identified      Family Dynamics   Good Support System? Yes    Comments Patient has a very supportive wife and family, children.      Barriers   Psychosocial barriers to participate in program There are no identifiable barriers or psychosocial needs.  Screening Interventions   Interventions Encouraged to exercise    Expected Outcomes Long Term Goal: Stressors or current issues are controlled or eliminated.;Short Term goal: Identification and review with participant of any Quality of Life or Depression concerns found by scoring the questionnaire.;Long Term goal: The participant improves quality of Life and PHQ9 Scores as seen by post scores and/or verbalization of changes;Short Term goal: Utilizing psychosocial counselor, staff and physician to assist with identification of specific Stressors or current issues interfering with healing process. Setting desired goal for each stressor or current issue identified.             Quality of Life Scores:  Scores of 19 and below usually  indicate a poorer quality of life in these areas.  A difference of  2-3 points is a clinically meaningful difference.  A difference of 2-3 points in the total score of the Quality of Life Index has been associated with significant improvement in overall quality of life, self-image, physical symptoms, and general health in studies assessing change in quality of life.  PHQ-9: Review Flowsheet       11/06/2023 01/01/2023 03/22/2014  Depression screen PHQ 2/9  Decreased Interest 1 2 0  Down, Depressed, Hopeless 0 2 0  PHQ - 2 Score 1 4 0  Altered sleeping 0 2 -  Tired, decreased energy 1 2 -  Change in appetite 0 2 -  Feeling bad or failure about yourself  1 2 -  Trouble concentrating 0 0 -  Moving slowly or fidgety/restless 0 0 -  Suicidal thoughts 0 0 -  PHQ-9 Score 3 12 -  Difficult doing work/chores Somewhat difficult Somewhat difficult -   Interpretation of Total Score  Total Score Depression Severity:  1-4 = Minimal depression, 5-9 = Mild depression, 10-14 = Moderate depression, 15-19 = Moderately severe depression, 20-27 = Severe depression   Psychosocial Evaluation and Intervention:  Psychosocial Evaluation - 11/06/23 1030       Psychosocial Evaluation & Interventions   Interventions Encouraged to exercise with the program and follow exercise prescription    Comments Alan White denies any psy/soc barriers or concerns    Expected Outcomes For Kelly to exercise in [place] free of any psy/soc barriers or concerns    Continue Psychosocial Services  No Follow up required             Psychosocial Re-Evaluation:  Psychosocial Re-Evaluation     Row Name 11/13/23 1402 12/11/23 1130           Psychosocial Re-Evaluation   Current issues with None Identified None Identified      Comments Alan White continues to deny any psychosocial barriers or concerns at this time. Alan White continues to deny any psychosocial barriers or concerns at this time.      Expected Outcomes For Alan White to participate in  PR free of any psychosocial barriers or concerns For Alan White to participate in [place] free of any psychosocial barriers or concerns      Interventions Encouraged to attend Pulmonary Rehabilitation for the exercise Encouraged to attend Pulmonary Rehabilitation for the exercise      Continue Psychosocial Services  No Follow up required No Follow up required               Psychosocial Discharge (Final Psychosocial Re-Evaluation):  Psychosocial Re-Evaluation - 12/11/23 1130       Psychosocial Re-Evaluation   Current issues with None Identified    Comments Alan White continues to deny any psychosocial barriers or concerns at  this time.    Expected Outcomes For Alan White to participate in [place] free of any psychosocial barriers or concerns    Interventions Encouraged to attend Pulmonary Rehabilitation for the exercise    Continue Psychosocial Services  No Follow up required             Education: Education Goals: Education classes will be provided on a weekly basis, covering required topics. Participant will state understanding/return demonstration of topics presented.  Learning Barriers/Preferences:  Learning Barriers/Preferences - 11/06/23 1031       Learning Barriers/Preferences   Learning Barriers None    Learning Preferences Group Instruction;Individual Instruction;Written Material             Education Topics: Know Your Numbers Group instruction that is supported by a PowerPoint presentation. Instructor discusses importance of knowing and understanding resting, exercise, and post-exercise oxygen saturation, heart rate, and blood pressure. Oxygen saturation, heart rate, blood pressure, rating of perceived exertion, and dyspnea are reviewed along with a normal range for these values.    Exercise for the Pulmonary Patient Group instruction that is supported by a PowerPoint presentation. Instructor discusses benefits of exercise, core components of exercise, frequency, duration, and  intensity of an exercise routine, importance of utilizing pulse oximetry during exercise, safety while exercising, and options of places to exercise outside of rehab.    MET Level  Group instruction provided by PowerPoint, verbal discussion, and written material to support subject matter. Instructor reviews what METs are and how to increase METs.  Flowsheet Row PULMONARY REHAB OTHER RESPIRATORY from 11/21/2023 in [facility] for Heart, Vascular, & Lung Health  Date 11/21/23  Educator EP  Instruction Review Code 1- Verbalizes Understanding       Pulmonary Medications Verbally interactive group education provided by instructor with focus on inhaled medications and proper administration.   Anatomy and Physiology of the Respiratory System Group instruction provided by PowerPoint, verbal discussion, and written material to support subject matter. Instructor reviews respiratory cycle and anatomical components of the respiratory system and their functions. Instructor also reviews differences in obstructive and restrictive respiratory diseases with examples of each.    Oxygen Safety Group instruction provided by PowerPoint, verbal discussion, and written material to support subject matter. There is an overview of "What is Oxygen" and "Why do we need it".  Instructor also reviews how to create a safe environment for oxygen use, the importance of using oxygen as prescribed, and the risks of noncompliance. There is a brief discussion on traveling with oxygen and resources the patient may utilize.   Oxygen Use Group instruction provided by PowerPoint, verbal discussion, and written material to discuss how supplemental oxygen is prescribed and different types of oxygen supply systems. Resources for more information are provided.    Breathing Techniques Group instruction that is supported by demonstration and informational handouts. Instructor discusses the benefits of pursed  lip and diaphragmatic breathing and detailed demonstration on how to perform both.     Risk Factor Reduction Group instruction that is supported by a PowerPoint presentation. Instructor discusses the definition of a risk factor, different risk factors for pulmonary disease, and how the heart and lungs work together. Flowsheet Row PULMONARY REHAB OTHER RESPIRATORY from 11/14/2023 in [facility] for Heart, Vascular, & Lung Health  Date 11/14/23  Educator EP  Instruction Review Code 1- Verbalizes Understanding       Pulmonary Diseases Group instruction provided by PowerPoint, verbal discussion, and written material to support subject  matter. Instructor gives an overview of the different type of pulmonary diseases. There is also a discussion on risk factors and symptoms as well as ways to manage the diseases.   Stress and Energy Conservation Group instruction provided by PowerPoint, verbal discussion, and written material to support subject matter. Instructor gives an overview of stress and the impact it can have on the body. Instructor also reviews ways to reduce stress. There is also a discussion on energy conservation and ways to conserve energy throughout the day.   Warning Signs and Symptoms Group instruction provided by PowerPoint, verbal discussion, and written material to support subject matter. Instructor reviews warning signs and symptoms of stroke, heart attack, cold and flu. Instructor also reviews ways to prevent the spread of infection.   Other Education Group or individual verbal, written, or video instructions that support the educational goals of the pulmonary rehab program. Flowsheet Row PULMONARY REHAB OTHER RESPIRATORY from 12/12/2023 in [facility] for Heart, Vascular, & Lung Health  Date 12/12/23  Educator EP  Instruction Review Code 1- Verbalizes Understanding        Knowledge Questionnaire Score:   Core  Components/Risk Factors/Patient Goals at Admission:  Personal Goals and Risk Factors at Admission - 11/06/23 1031       Core Components/Risk Factors/Patient Goals on Admission   Improve shortness of breath with ADL's Yes    Intervention Provide education, individualized exercise plan and daily activity instruction to help decrease symptoms of SOB with activities of daily living.    Expected Outcomes Short Term: Improve cardiorespiratory fitness to achieve a reduction of symptoms when performing ADLs;Long Term: Be able to perform more ADLs without symptoms or delay the onset of symptoms             Core Components/Risk Factors/Patient Goals Review:   Goals and Risk Factor Review     Row Name 11/13/23 1404 12/11/23 1130           Core Components/Risk Factors/Patient Goals Review   Personal Goals Review Improve shortness of breath with ADL's;Develop more efficient breathing techniques such as purse lipped breathing and diaphragmatic breathing and practicing self-pacing with activity.;Tobacco Cessation Improve shortness of breath with ADL's;Develop more efficient breathing techniques such as purse lipped breathing and diaphragmatic breathing and practicing self-pacing with activity.      Review Alan White has attended one session so far. Goal progressing for improving shortness of breath with ADL's. Goal progressing for developing more efficient breathing techniques such as purse lipped breathing and diaphragmatic breathing; and practicing self-pacing with activity. Goal met for tobacco cessation. Kelly quit smoking one week prior to starting class. He states he is continuing to refrain from smoking, but it's the hardest thing he's ever done. Staff continues to encourage Milroy on his success with quitting smoking. We will continue to monitor Kelly's progress throughout the program. Goal progressing for improving shortness of breath with ADL's. Goal progressing for developing more efficient breathing  techniques such as purse lipped breathing and diaphragmatic breathing; and practicing self-pacing with activity. Alan White is currently exercising on RA to keep sats >88%. He is currently exercising on the Nustep and the treadmill. He has been able to increase his METs and workload. We will continue to monitor Kelly's progress throughout the program.      Expected Outcomes To improve shortness of breath with ADL's and develop more efficient breathing techniques such as purse lipped breathing and diaphragmatic breathing; and practicing self-pacing with activity. To improve shortness of breath  with ADL's and develop more efficient breathing techniques such as purse lipped breathing and diaphragmatic breathing; and practicing self-pacing with activity.               Core Components/Risk Factors/Patient Goals at Discharge (Final Review):   Goals and Risk Factor Review - 12/11/23 1130       Core Components/Risk Factors/Patient Goals Review   Personal Goals Review Improve shortness of breath with ADL's;Develop more efficient breathing techniques such as purse lipped breathing and diaphragmatic breathing and practicing self-pacing with activity.    Review Goal progressing for improving shortness of breath with ADL's. Goal progressing for developing more efficient breathing techniques such as purse lipped breathing and diaphragmatic breathing; and practicing self-pacing with activity. Alan White is currently exercising on RA to keep sats >88%. He is currently exercising on the Nustep and the treadmill. He has been able to increase his METs and workload. We will continue to monitor Kelly's progress throughout the program.    Expected Outcomes To improve shortness of breath with ADL's and develop more efficient breathing techniques such as purse lipped breathing and diaphragmatic breathing; and practicing self-pacing with activity.             ITP Comments:Pt is making expected progress toward Pulmonary Rehab  goals after completing 10 session(s). Recommend continued exercise, life style modification, education, and utilization of breathing techniques to increase stamina and strength, while also decreasing shortness of breath with exertion.  Dr. Mechele Collin is Medical Director for Pulmonary Rehab at [facility].     Comments:

## 2023-12-19 ENCOUNTER — Encounter (HOSPITAL_COMMUNITY): Payer: PPO

## 2023-12-19 ENCOUNTER — Telehealth (HOSPITAL_COMMUNITY): Payer: Self-pay | Admitting: *Deleted

## 2023-12-19 NOTE — Telephone Encounter (Signed)
 Pt LVM stating he is still recovering from his Repatha shot. He plans to return to [place] next Tuesday but will miss today.  Ethelda Chick BS, ACSM-CEP 12/19/2023 8:42 AM

## 2023-12-24 ENCOUNTER — Encounter (HOSPITAL_COMMUNITY)
Admission: RE | Admit: 2023-12-24 | Discharge: 2023-12-24 | Disposition: A | Discharge status: Home / Self Care | Payer: PPO | Source: Ambulatory Visit | Attending: Internal Medicine | Admitting: Internal Medicine

## 2023-12-24 DIAGNOSIS — I5022 Chronic systolic (congestive) heart failure: Secondary | ICD-10-CM

## 2023-12-24 NOTE — Progress Notes (Signed)
 Daily Session Note  Patient Details  Name: Alan White MRN: [id number] Date of Birth: 07-12-1961 Referring Provider:   Doristine Devoid Pulmonary Rehab Walk Test from 11/06/2023 in [facility] for Heart, Vascular, & Lung Health  Referring Provider Hilty       Encounter Date: 12/24/2023  Check In:  Session Check In - 12/24/23 1044       Check-In   Supervising physician immediately available to respond to emergencies [facility] MD immediately available    Physician(s) Eligha Bridegroom, NP    Location [facility]    Staff Present Raford Pitcher, MS, ACSM-CEP, Exercise Physiologist;Casey Erin Sons BS, ACSM-CEP, Exercise Physiologist;Mary Gerre Scull, RN, BSN;Samantha Belarus, RD, LDN    Virtual Visit No    Medication changes reported     No    Fall or balance concerns reported    No    Tobacco Cessation No Change    Warm-up and Cool-down Performed as group-led instruction    Resistance Training Performed Yes    VAD Patient? No    PAD/SET Patient? No      Pain Assessment   Currently in Pain? No/denies             Capillary Blood Glucose: No results found for this or any previous visit (from the past 24 hours).    Social History   Tobacco Use  Smoking Status Former   Current packs/day: 0.00   Average packs/day: 0.5 packs/day for 50.0 years (25.0 ttl pk-yrs)   Types: Cigarettes   Quit date: 10/14/2023   Years since quitting: 0.1  Smokeless Tobacco Never    Goals Met:  Independence with exercise equipment Exercise tolerated well No report of concerns or symptoms today Strength training completed today  Goals Unmet:  Not Applicable  Comments: Service time is from 1017 to 1131.    Dr. Mechele Collin is Medical Director for Pulmonary Rehab at [facility].

## 2023-12-26 ENCOUNTER — Encounter (HOSPITAL_COMMUNITY)
Admission: RE | Admit: 2023-12-26 | Discharge: 2023-12-26 | Disposition: A | Discharge status: Home / Self Care | Payer: PPO | Source: Ambulatory Visit | Attending: Internal Medicine | Admitting: Internal Medicine

## 2023-12-26 DIAGNOSIS — I5022 Chronic systolic (congestive) heart failure: Secondary | ICD-10-CM | POA: Diagnosis not present

## 2023-12-26 NOTE — Progress Notes (Signed)
 Daily Session Note  Patient Details  Name: Alan White MRN: [id number] Date of Birth: [birth year] Referring Provider:   Doristine Devoid Pulmonary Rehab Walk Test from 11/06/2023 in [facility] for Heart, Vascular, & Lung Health  Referring Provider Hilty       Encounter Date: 12/26/2023  Check In:  Session Check In - 12/26/23 1034       Check-In   Supervising physician immediately available to respond to emergencies [facility] MD immediately available    Physician(s) Jari Favre, PA    Location [facility]    Staff Present Raford Pitcher, MS, ACSM-CEP, Exercise Physiologist;Katalaya Beel Erin Sons BS, ACSM-CEP, Exercise Physiologist;Mary Gerre Scull, RN, BSN;Samantha Belarus, RD, LDN    Virtual Visit No    Medication changes reported     No    Fall or balance concerns reported    No    Tobacco Cessation No Change    Warm-up and Cool-down Performed as group-led instruction    Resistance Training Performed Yes    VAD Patient? No    PAD/SET Patient? No      Pain Assessment   Currently in Pain? No/denies             Capillary Blood Glucose: No results found for this or any previous visit (from the past 24 hours).    Social History   Tobacco Use  Smoking Status Former   Current packs/day: 0.00   Average packs/day: 0.5 packs/day for 50.0 years (25.0 ttl pk-yrs)   Types: Cigarettes   Quit date: 10/14/2023   Years since quitting: 0.2  Smokeless Tobacco Never    Goals Met:  Proper associated with RPD/PD & O2 Sat Independence with exercise equipment Exercise tolerated well No report of concerns or symptoms today Strength training completed today  Goals Unmet:  Not Applicable  Comments: Service time is from 1012 to 1137.    Dr. Mechele Collin is Medical Director for Pulmonary Rehab at [facility].

## 2023-12-29 NOTE — Progress Notes (Unsigned)
  Cardiology Office Note:   Date:  12/30/2023  ID:  Alan White, DOB December 05, 1960, MRN [id number]  Primary Cardiologist: Chrystie Nose, MD Electrophysiologist: Regan Lemming, MD   History of Present Illness:   Alan White is a 63 y.o. male with h/o CAD, CHF, HLD, and OSA seen today for routine electrophysiology followup.   Since last being seen in our clinic the patient reports doing well. Was following at the [facility] for a time, but now wants to centralize his care back here again. Currently,  he denies chest pain, palpitations, dyspnea, PND, orthopnea, nausea, vomiting, dizziness, syncope, edema, weight gain, or early satiety.     Review of systems complete and found to be negative unless listed in HPI.    EP Information / Studies Reviewed:    EKG is not ordered today. EKG from 08/05/2023 reviewed which showed NSR at 68 bpm with 1st degree AV block    Arrhythmia/Device History Abbott Dual Chamber ICD 12/17/2019 for CHF BAT [place]) 08/2020   Barostim Interrogation- Performed personally and reviewed in detail today,  See scanned report  ICD/PPM interrogation - Performed personally and reviewed in detail today.    Physical Exam:   VS:  BP 108/72 (BP Location: Right Arm, Patient Position: Sitting, Cuff Size: Normal)   Pulse 67   Resp 16   Ht 6' (1.829 m)   Wt 213 lb (96.6 kg)   SpO2 96%   BMI 28.89 kg/m    Wt Readings from Last 3 Encounters:  12/30/23 213 lb (96.6 kg)  12/12/23 212 lb 15.4 oz (96.6 kg)  12/03/23 210 lb 5.1 oz (95.4 kg)     GEN: Well nourished, well developed in no acute distress NECK: No JVD; No carotid bruits CARDIAC: Regular rate and rhythm, no murmurs, rubs, gallops RESPIRATORY:  Clear to auscultation without rales, wheezing or rhonchi  ABDOMEN: Soft, non-tender, non-distended EXTREMITIES:  No edema; No deformity   ASSESSMENT AND PLAN:    Chronic systolic CHF s/p Abbott Defibrillator and Barostim implantation NYHA II-III symptoms.    Device programmed at 6.23ma @ 125us for chronic settings  Device impedence stable. Normal device function See scanned report. Barostim battery good through 10/2023. Pt has requested remotes be moved back here to [facility]. Will request release from [place].  Labs today for completeness.   CAD Denies s/s ischemia  Disposition:   Follow up with EP APP in in 6 months  Signed, Graciella Freer, PA-C

## 2023-12-30 ENCOUNTER — Ambulatory Visit: Attending: Student | Admitting: Student

## 2023-12-30 ENCOUNTER — Encounter: Payer: Self-pay | Admitting: Student

## 2023-12-30 VITALS — BP 108/72 | HR 67 | Resp 16 | Ht 72.0 in | Wt 213.0 lb

## 2023-12-30 DIAGNOSIS — I5022 Chronic systolic (congestive) heart failure: Secondary | ICD-10-CM | POA: Diagnosis not present

## 2023-12-30 DIAGNOSIS — I255 Ischemic cardiomyopathy: Secondary | ICD-10-CM | POA: Diagnosis not present

## 2023-12-30 DIAGNOSIS — E782 Mixed hyperlipidemia: Secondary | ICD-10-CM | POA: Diagnosis not present

## 2023-12-30 DIAGNOSIS — I2510 Atherosclerotic heart disease of native coronary artery without angina pectoris: Secondary | ICD-10-CM | POA: Diagnosis not present

## 2023-12-30 LAB — CUP PACEART INCLINIC DEVICE CHECK
Battery Remaining Longevity: 79 mo
Brady Statistic RV Percent Paced: 0.02 %
Date Time Interrogation Session: 20250331092505
HighPow Impedance: 75.375
Implantable Lead Connection Status: 753985
Implantable Lead Implant Date: 20210318
Implantable Lead Location: 753860
Implantable Pulse Generator Implant Date: 20210318
Lead Channel Impedance Value: 437.5 Ohm
Lead Channel Pacing Threshold Amplitude: 0.75 V
Lead Channel Pacing Threshold Amplitude: 0.75 V
Lead Channel Pacing Threshold Pulse Width: 0.4 ms
Lead Channel Pacing Threshold Pulse Width: 0.4 ms
Lead Channel Sensing Intrinsic Amplitude: 12 mV
Lead Channel Setting Pacing Amplitude: 2 V
Lead Channel Setting Pacing Pulse Width: 0.4 ms
Lead Channel Setting Sensing Sensitivity: 0.5 mV
Pulse Gen Serial Number: 111013899
Zone Setting Status: 755011

## 2023-12-30 NOTE — Patient Instructions (Signed)
 Medication Instructions:  Your physician recommends that you continue on your current medications as directed. Please refer to the Current Medication list given to you today.  *If you need a refill on your cardiac medications before your next appointment, please call your pharmacy*  Lab Work: BMET, CBC-TODAY If you have labs (blood work) drawn today and your tests are completely normal, you will receive your results only by: MyChart Message (if you have MyChart) OR A paper copy in the mail If you have any lab test that is abnormal or we need to change your treatment, we will call you to review the results.  Follow-Up: At [facility], you and your health needs are our priority.  As part of our continuing mission to provide you with exceptional heart care, our providers are all part of one team.  This team includes your primary Cardiologist (physician) and Advanced Practice Providers or APPs (Physician Assistants and Nurse Practitioners) who all work together to provide you with the care you need, when you need it.  Your next appointment:   6 month(s)  Provider:   Baldwin Crown" Tillery, PA-C only    1st Floor: - Lobby - Registration  - Pharmacy  - Lab - Cafe  2nd Floor: - PV Lab - Diagnostic Testing (echo, CT, nuclear med)  3rd Floor: - Vacant  4th Floor: - TCTS (cardiothoracic surgery) - AFib Clinic - Structural Heart Clinic - Vascular Surgery  - Vascular Ultrasound  5th Floor: - HeartCare Cardiology (general and EP) - Clinical Pharmacy for coumadin, hypertension, lipid, weight-loss medications, and med management appointments    Valet parking services will be available as well.

## 2023-12-31 ENCOUNTER — Encounter (HOSPITAL_COMMUNITY)
Admission: RE | Admit: 2023-12-31 | Discharge: 2023-12-31 | Disposition: A | Discharge status: Home / Self Care | Payer: PPO | Source: Ambulatory Visit | Attending: Internal Medicine | Admitting: Internal Medicine

## 2023-12-31 VITALS — Wt 213.2 lb

## 2023-12-31 DIAGNOSIS — I5022 Chronic systolic (congestive) heart failure: Secondary | ICD-10-CM | POA: Insufficient documentation

## 2023-12-31 DIAGNOSIS — I255 Ischemic cardiomyopathy: Secondary | ICD-10-CM | POA: Insufficient documentation

## 2023-12-31 NOTE — Progress Notes (Signed)
 Daily Session Note  Patient Details  Name: Alan White MRN: [id number] Date of Birth: Jun 23, 1961 Referring Provider:   Doristine Devoid Pulmonary Rehab Walk Test from 11/06/2023 in [facility] for Heart, Vascular, & Lung Health  Referring Provider Hilty       Encounter Date: 12/31/2023  Check In:  Session Check In - 12/31/23 1014       Check-In   Supervising physician immediately available to respond to emergencies [facility] MD immediately available    Physician(s) Jari Favre, PA    Location [facility]    Staff Present Raford Pitcher, MS, ACSM-CEP, Exercise Physiologist;Ehan Freas Erin Sons BS, ACSM-CEP, Exercise Physiologist;Mary Gerre Scull, RN, BSN;Samantha Belarus, RD, LDN;Johnny Hale Bogus, MS, Exercise Physiologist    Virtual Visit No    Medication changes reported     No    Fall or balance concerns reported    No    Tobacco Cessation No Change    Warm-up and Cool-down Performed as group-led instruction    Resistance Training Performed Yes    VAD Patient? No    PAD/SET Patient? No      Pain Assessment   Currently in Pain? No/denies    Pain Score 0-No pain    Multiple Pain Sites No             Capillary Blood Glucose: No results found for this or any previous visit (from the past 24 hours).   Exercise Prescription Changes - 12/31/23 1200       Response to Exercise   Blood Pressure (Admit) 100/64    Blood Pressure (Exercise) 148/78    Blood Pressure (Exit) 112/70    Heart Rate (Admit) 73 bpm    Heart Rate (Exercise) 97 bpm    Heart Rate (Exit) 82 bpm    Oxygen Saturation (Admit) 98 %    Oxygen Saturation (Exercise) 97 %    Oxygen Saturation (Exit) 96 %    Rating of Perceived Exertion (Exercise) 12    Perceived Dyspnea (Exercise) 1    Duration Continue with 30 min of aerobic exercise without signs/symptoms of physical distress.    Intensity THRR unchanged      Progression   Progression Continue to progress workloads to  maintain intensity without signs/symptoms of physical distress.      Resistance Training   Training Prescription Yes    Weight black bands    Reps 10-15    Time 10 Minutes      Treadmill   MPH 3    Grade 1.5    Minutes 15    METs 3.5      NuStep   Level 4    Minutes 15    METs 4.2             Social History   Tobacco Use  Smoking Status Former   Current packs/day: 0.00   Average packs/day: 0.5 packs/day for 50.0 years (25.0 ttl pk-yrs)   Types: Cigarettes   Quit date: 10/14/2023   Years since quitting: 0.2  Smokeless Tobacco Never    Goals Met:  Proper associated with RPD/PD & O2 Sat Independence with exercise equipment Exercise tolerated well No report of concerns or symptoms today Strength training completed today  Goals Unmet:  Not Applicable  Comments: Service time is from 1016 to 1139.    Dr. Mechele Collin is Medical Director for Pulmonary Rehab at [facility].

## 2024-01-02 ENCOUNTER — Encounter (HOSPITAL_COMMUNITY)
Admission: RE | Admit: 2024-01-02 | Discharge: 2024-01-02 | Disposition: A | Discharge status: Home / Self Care | Payer: PPO | Source: Ambulatory Visit | Attending: Internal Medicine | Admitting: Internal Medicine

## 2024-01-02 DIAGNOSIS — I5022 Chronic systolic (congestive) heart failure: Secondary | ICD-10-CM

## 2024-01-02 NOTE — Progress Notes (Signed)
 Daily Session Note  Patient Details  Name: Alan White MRN: [id number] Date of Birth: 1961-03-08 Referring Provider:   Doristine Devoid Pulmonary Rehab Walk Test from 11/06/2023 in [facility]  Referring Provider Hilty       Encounter Date: 01/02/2024  Check In:  Session Check In - 01/02/24 1055       Check-In   Supervising physician immediately available to respond to emergencies [facility] MD immediately available    Physician(s) Carlos Levering    Location [facility]    Staff Present Raford Pitcher, MS, ACSM-CEP, Exercise Physiologist;Randi Dionisio Paschal, ACSM-CEP, Exercise Physiologist;Mary Gerre Scull, RN, BSN;Samantha Belarus, RD, LDN;Johnny Hale Bogus, MS, Exercise Physiologist    Virtual Visit No    Medication changes reported     No    Fall or balance concerns reported    No    Tobacco Cessation No Change    Warm-up and Cool-down Performed as group-led instruction    Resistance Training Performed Yes    VAD Patient? No    PAD/SET Patient? No      Pain Assessment   Currently in Pain? No/denies    Pain Score 0-No pain    Multiple Pain Sites No             Capillary Blood Glucose: No results found for this or any previous visit (from the past 24 hours).    Social History   Tobacco Use  Smoking Status Former   Current packs/day: 0.00   Average packs/day: 0.5 packs/day for 50.0 years (25.0 ttl pk-yrs)   Types: Cigarettes   Quit date: 10/14/2023   Years since quitting: 0.2  Smokeless Tobacco Never    Goals Met:  Proper associated with RPD/PD & O2 Sat Independence with exercise equipment Exercise tolerated well No report of concerns or symptoms today Strength training completed today  Goals Unmet:  Not Applicable  Comments: Service time is from 1012 to 1135.    Dr. Mechele Collin is Medical Director for Pulmonary Rehab at [facility].

## 2024-01-07 ENCOUNTER — Encounter (HOSPITAL_COMMUNITY)
Admission: RE | Admit: 2024-01-07 | Discharge: 2024-01-07 | Disposition: A | Discharge status: Home / Self Care | Payer: PPO | Source: Ambulatory Visit | Attending: Internal Medicine

## 2024-01-07 DIAGNOSIS — I5022 Chronic systolic (congestive) heart failure: Secondary | ICD-10-CM | POA: Diagnosis not present

## 2024-01-07 NOTE — Progress Notes (Signed)
 Daily Session Note  Patient Details  Name: Alan White MRN: [id number] Date of Birth: 07/06/1961 Referring Provider:   Doristine Devoid Pulmonary Rehab Walk Test from 11/06/2023 in [facility]  Referring Provider Hilty       Encounter Date: 01/07/2024  Check In:  Session Check In - 01/07/24 1112       Check-In   Supervising physician immediately available to respond to emergencies [facility] MD immediately available    Physician(s) Rise Paganini, NP    [facility]    Staff Present Raford Pitcher, MS, ACSM-CEP, Exercise Physiologist;Randi Dionisio Paschal, ACSM-CEP, Exercise Physiologist;Mary Gerre Scull, RN, BSN;Samantha Belarus, RD, LDN;Johnny Hale Bogus, MS, Exercise Physiologist    Virtual Visit No    Medication changes reported     No    Fall or balance concerns reported    No    Tobacco Cessation No Change    Warm-up and Cool-down Performed as group-led instruction    Resistance Training Performed Yes    VAD Patient? No    PAD/SET Patient? No      Pain Assessment   Currently in Pain? No/denies    Multiple Pain Sites No             Capillary Blood Glucose: No results found for this or any previous visit (from the past 24 hours).    Social History   Tobacco Use  Smoking Status Former   Current packs/day: 0.00   Average packs/day: 0.5 packs/day for 50.0 years (25.0 ttl pk-yrs)   Types: Cigarettes   Quit date: 10/14/2023   Years since quitting: 0.2  Smokeless Tobacco Never    Goals Met:  Proper associated with RPD/PD & O2 Sat Independence with exercise equipment Exercise tolerated well No report of concerns or symptoms today Strength training completed today  Goals Unmet:  Not Applicable  Comments: Service time is from 1014 to 1133.    Dr. Mechele Collin is Medical Director for Pulmonary Rehab at [facility].

## 2024-01-09 ENCOUNTER — Encounter (HOSPITAL_COMMUNITY)
Admission: RE | Admit: 2024-01-09 | Discharge: 2024-01-09 | Disposition: A | Discharge status: Home / Self Care | Payer: PPO | Source: Ambulatory Visit | Attending: Internal Medicine

## 2024-01-09 DIAGNOSIS — I5022 Chronic systolic (congestive) heart failure: Secondary | ICD-10-CM | POA: Diagnosis not present

## 2024-01-09 NOTE — Progress Notes (Signed)
 Home Exercise Prescription I have reviewed a Home Exercise Prescription with Alan White. Alan White is currently exercising at home. He walks 2 days/wk for 14-20 min/day. I encouraged him to increase his frequency and time. Alan White agreed with my recommendations. He is very motivated to exercise and improve his functional capacity. I am confident in Alan White carrying out an exercise regimen at home. The patient stated that their goals were to maintain current health. We reviewed exercise guidelines, target heart rate during exercise, RPE Scale, weather conditions, endpoints for exercise, warmup and cool down. The patient is encouraged to come to me with any questions. I will continue to follow up with the patient to assist them with progression and safety. Spent 15 min with patient discussing home exercise plan and goals  Joya San, MS, ACSM-CEP 01/09/2024 3:33 PM

## 2024-01-09 NOTE — Progress Notes (Signed)
 Daily Session Note  Patient Details  Name: Alan White MRN: [id number] Date of Birth: 11-18-1960 Referring Provider:   Doristine Devoid Pulmonary Rehab Walk Test from 11/06/2023 in [facility]  Referring Provider Hilty       Encounter Date: 01/09/2024  Check In:  Session Check In - 01/09/24 1032       Check-In   Supervising physician immediately available to respond to emergencies [facility] MD immediately available    Physician(s) Robin Searing NP    [facility]    Staff Present Raford Pitcher, MS, ACSM-CEP, Exercise Physiologist;Randi Dionisio Paschal, ACSM-CEP, Exercise Physiologist;Mary Gerre Scull, RN, BSN;Samantha Belarus, RD, LDN;Johnny Hale Bogus, MS, Exercise Physiologist    Virtual Visit No    Medication changes reported     No    Fall or balance concerns reported    No    Tobacco Cessation No Change    Warm-up and Cool-down Performed as group-led instruction    Resistance Training Performed Yes    VAD Patient? No    PAD/SET Patient? No      Pain Assessment   Currently in Pain? No/denies    Multiple Pain Sites No             Capillary Blood Glucose: No results found for this or any previous visit (from the past 24 hours).    Social History   Tobacco Use  Smoking Status Former   Current packs/day: 0.00   Average packs/day: 0.5 packs/day for 50.0 years (25.0 ttl pk-yrs)   Types: Cigarettes   Quit date: 10/14/2023   Years since quitting: 0.2  Smokeless Tobacco Never    Goals Met:  Proper associated with RPD/PD & O2 Sat Independence with exercise equipment Exercise tolerated well No report of concerns or symptoms today Strength training completed today  Goals Unmet:  Not Applicable  Comments: Service time is from 1012 to 1139.    Dr. Mechele Collin is Medical Director for Pulmonary Rehab at [facility].

## 2024-01-14 ENCOUNTER — Encounter (HOSPITAL_COMMUNITY)
Admission: RE | Admit: 2024-01-14 | Discharge: 2024-01-14 | Disposition: A | Discharge status: Home / Self Care | Payer: PPO | Source: Ambulatory Visit | Attending: Internal Medicine | Admitting: Internal Medicine

## 2024-01-14 VITALS — Wt 212.3 lb

## 2024-01-14 DIAGNOSIS — I5022 Chronic systolic (congestive) heart failure: Secondary | ICD-10-CM

## 2024-01-14 NOTE — Progress Notes (Signed)
 Daily Session Note  Patient Details  Name: Alan White MRN: [id number] Date of Birth: Sep 15, 1961 Referring Provider:   Doristine Devoid Pulmonary Rehab Walk Test from 11/06/2023 in [facility]  Referring Provider Hilty       Encounter Date: 01/14/2024  Check In:  Session Check In - 01/14/24 1106       Check-In   Supervising physician immediately available to respond to emergencies [facility] MD immediately available    Physician(s) Rise Paganini, NP    Location [facility]    Staff Present Raford Pitcher, MS, ACSM-CEP, Exercise Physiologist;Randi Dionisio Paschal, ACSM-CEP, Exercise Physiologist;Mary Gerre Scull, RN, Fuller Plan, RT    Virtual Visit No    Medication changes reported     No    Fall or balance concerns reported    No    Tobacco Cessation No Change    Warm-up and Cool-down Performed as group-led instruction    Resistance Training Performed Yes    VAD Patient? No    PAD/SET Patient? No      Pain Assessment   Currently in Pain? No/denies    Multiple Pain Sites No             Capillary Blood Glucose: No results found for this or any previous visit (from the past 24 hours).   Exercise Prescription Changes - 01/14/24 1200       Response to Exercise   Blood Pressure (Admit) 108/58    Blood Pressure (Exercise) 140/70    Blood Pressure (Exit) 106/70    Heart Rate (Admit) 77 bpm    Heart Rate (Exercise) 100 bpm    Heart Rate (Exit) 82 bpm    Oxygen Saturation (Admit) 97 %    Oxygen Saturation (Exercise) 92 %    Oxygen Saturation (Exit) 95 %    Rating of Perceived Exertion (Exercise) 12    Perceived Dyspnea (Exercise) 1    Duration Continue with 30 min of aerobic exercise without signs/symptoms of physical distress.    Intensity THRR unchanged      Progression   Progression Continue to progress workloads to maintain intensity without signs/symptoms of physical distress.      Resistance  Training   Training Prescription Yes    Weight black bands    Reps 10-15    Time 10 Minutes      Treadmill   MPH 3    Grade 1.5    Minutes 15    METs 3.5      NuStep   Level 5    Minutes 15    METs 4.1             Social History   Tobacco Use  Smoking Status Former   Current packs/day: 0.00   Average packs/day: 0.5 packs/day for 50.0 years (25.0 ttl pk-yrs)   Types: Cigarettes   Quit date: 10/14/2023   Years since quitting: 0.2  Smokeless Tobacco Never    Goals Met:  Proper associated with RPD/PD & O2 Sat Independence with exercise equipment Exercise tolerated well No report of concerns or symptoms today Strength training completed today  Goals Unmet:  Not Applicable  Comments: Service time is from 1011 to 1144.    Dr. Mechele Collin is Medical Director for Pulmonary Rehab at [facility].

## 2024-01-15 NOTE — Progress Notes (Signed)
 Pulmonary Individual Treatment Plan  Patient Details  Name: Alan White MRN: [id number] Date of Birth: 02/28/1961 Referring Provider:   Doristine Devoid Pulmonary Rehab Walk Test from 11/06/2023 in [facility]  Referring Provider Hilty       Initial Encounter Date:  Flowsheet Row Pulmonary Rehab Walk Test from 11/06/2023 in [facility] for Heart, Vascular, & Lung Health  Date 11/06/23       Visit Diagnosis: Heart failure, chronic systolic (HCC)  Patient's Home Medications on Admission:   Current Outpatient Medications:    aspirin EC 81 MG tablet, Take 1 tablet (81 mg total) by mouth daily., Disp: 30 tablet, Rfl: 11   atorvastatin (LIPITOR) 80 MG tablet, Take 1 tablet (80 mg total) by mouth daily., Disp: 90 tablet, Rfl: 2   clopidogrel (PLAVIX) 75 MG tablet, Take 1 tablet (75 mg total) by mouth daily., Disp: 90 tablet, Rfl: 2   isosorbide mononitrate (IMDUR) 30 MG 24 hr tablet, Take 1 tablet (30 mg total) by mouth daily., Disp: 90 tablet, Rfl: 3   metoprolol succinate (TOPROL XL) 25 MG 24 hr tablet, Take 0.5 tablets (12.5 mg total) by mouth daily., Disp: 90 tablet, Rfl: 3   nitroGLYCERIN (NITROSTAT) 0.4 MG SL tablet, Place 1 tablet (0.4 mg total) under the tongue every 5 (five) minutes x 3 doses as needed for chest pain., Disp: 25 tablet, Rfl: 12   pantoprazole (PROTONIX) 40 MG tablet, Take 1 tablet (40 mg total) by mouth daily., Disp: 90 tablet, Rfl: 3   sacubitril-valsartan (ENTRESTO) 24-26 MG, Take 1 tablet by mouth 2 (two) times daily., Disp: 180 tablet, Rfl: 3  Past Medical History: Past Medical History:  Diagnosis Date   AICD (automatic cardioverter/defibrillator) present    [facility]) [employer] ICD   CAD (coronary artery disease), 12/29/13 PCI/DES LCX and PCI/DES LAD with overlapping DES  12/30/2013   cath 07/05/14 OK   CHF (congestive heart failure) (HCC)    Dyslipidemia, goal LDL below 70  12/30/2013   GERD (gastroesophageal reflux disease)    History of kidney stones    Metabolic syndrome, HgbA1C 6.0  12/30/2013   Myocardial infarction [facility]) 2015,2020   Smoker    Wears partial dentures    upper    Tobacco Use: Social History   Tobacco Use  Smoking Status Former   Current packs/day: 0.00   Average packs/day: 0.5 packs/day for 50.0 years (25.0 ttl pk-yrs)   Types: Cigarettes   Quit date: 10/14/2023   Years since quitting: 0.2  Smokeless Tobacco Never    Labs: Review Flowsheet  More data exists      Latest Ref Rng & Units 01/31/2019 05/19/2019 04/20/2020 08/02/2020 08/05/2023  Labs for ITP Cardiac and Pulmonary Rehab  Cholestrol 100 - 199 mg/dL - 469  629  - 528   LDL (calc) 0 - 99 mg/dL - 60  67  - 78   HDL-C >39 mg/dL - 28  32  - 33   Trlycerides 0 - 149 mg/dL - 413  95  - 244   Hemoglobin A1c 4.8 - 5.6 % - 5.8  - - -  PH, Arterial 7.350 - 7.450 7.149  - - - -  PCO2 arterial 32.0 - 48.0 mmHg 47.6  - - - -  Bicarbonate 20.0 - 28.0 mmol/L 16.5  - - - -  TCO2 22 - 32 mmol/L 18  - - 24  -  Acid-base deficit 0.0 -  2.0 mmol/L 12.0  - - - -  O2 Saturation % 81.0  - - - -    Capillary Blood Glucose: Lab Results  Component Value Date   GLUCAP 193 (H) 08/02/2020   GLUCAP 181 (H) 08/02/2020   GLUCAP 130 (H) 08/02/2020     Pulmonary Assessment Scores:  Pulmonary Assessment Scores     Row Name 11/06/23 1035         ADL UCSD   ADL Phase Entry     SOB Score total 13       CAT Score   CAT Score 10       mMRC Score   mMRC Score 1             UCSD: Self-administered rating of dyspnea associated with activities of daily living (ADLs) 6-point scale (0 = "not at all" to 5 = "maximal or unable to do because of breathlessness")  Scoring Scores range from 0 to 120.  Minimally important difference is 5 units  CAT: CAT can identify the health impairment of COPD patients and is better correlated with disease progression.  CAT has a scoring range of zero to  40. The CAT score is classified into four groups of low (less than 10), medium (10 - 20), high (21-30) and very high (31-40) based on the impact level of disease on health status. A CAT score over 10 suggests significant symptoms.  A worsening CAT score could be explained by an exacerbation, poor medication adherence, poor inhaler technique, or progression of COPD or comorbid conditions.  CAT MCID is 2 points  mMRC: mMRC (Modified Medical Research Council) Dyspnea Scale is used to assess the degree of baseline functional disability in patients of respiratory disease due to dyspnea. No minimal important difference is established. A decrease in score of 1 point or greater is considered a positive change.   Pulmonary Function Assessment:  Pulmonary Function Assessment - 11/06/23 1106       Breath   Bilateral Breath Sounds Clear    Shortness of Breath Yes;Limiting activity             Exercise Target Goals: Exercise Program Goal: Individual exercise prescription set using results from initial 6 min walk test and THRR while considering  patient's activity barriers and safety.   Exercise Prescription Goal: Initial exercise prescription builds to 30-45 minutes a day of aerobic activity, 2-3 days per week.  Home exercise guidelines will be given to patient during program as part of exercise prescription that the participant will acknowledge.  Activity Barriers & Risk Stratification:  Activity Barriers & Cardiac Risk Stratification - 11/06/23 1027       Activity Barriers & Cardiac Risk Stratification   Activity Barriers None             6 Minute Walk:  6 Minute Walk     Row Name 11/06/23 1141         6 Minute Walk   Phase Initial     Distance 1820 feet     Walk Time 6 minutes     # of Rest Breaks 0     MPH 3.45     METS 3.95     RPE 9     Perceived Dyspnea  0.5     VO2 Peak 13.84     Symptoms No     Resting HR 81 bpm     Resting BP 120/60     Resting Oxygen  Saturation  95 %  Exercise Oxygen Saturation  during 6 min walk 94 %     Max Ex. HR 97 bpm     Max Ex. BP 110/64     2 Minute Post BP 108/64       Interval HR   1 Minute HR 83     2 Minute HR 83     3 Minute HR 97     4 Minute HR 97     5 Minute HR 97     6 Minute HR 97     2 Minute Post HR 92     Interval Heart Rate? Yes       Interval Oxygen   Interval Oxygen? Yes     Baseline Oxygen Saturation % 95 %     1 Minute Oxygen Saturation % 96 %     1 Minute Liters of Oxygen 0 L     2 Minute Oxygen Saturation % 94 %     2 Minute Liters of Oxygen 0 L     3 Minute Oxygen Saturation % 94 %     3 Minute Liters of Oxygen 0 L     4 Minute Oxygen Saturation % 94 %     4 Minute Liters of Oxygen 0 L     5 Minute Oxygen Saturation % 95 %     5 Minute Liters of Oxygen 0 L     6 Minute Oxygen Saturation % 95 %     6 Minute Liters of Oxygen 0 L     2 Minute Post Oxygen Saturation % 97 %     2 Minute Post Liters of Oxygen 0 L              Oxygen Initial Assessment:  Oxygen Initial Assessment - 11/06/23 1028       Home Oxygen   Home Oxygen Device None    Sleep Oxygen Prescription None    Home Exercise Oxygen Prescription None    Home Resting Oxygen Prescription None      Initial 6 min Walk   Oxygen Used None      Program Oxygen Prescription   Program Oxygen Prescription None      Intervention   Short Term Goals To learn and understand importance of maintaining oxygen saturations>88%;To learn and understand importance of monitoring SPO2 with pulse oximeter and demonstrate accurate use of the pulse oximeter.;To learn and demonstrate proper pursed lip breathing techniques or other breathing techniques.     Long  Term Goals Exhibits proper breathing techniques, such as pursed lip breathing or other method taught during program session;Verbalizes importance of monitoring SPO2 with pulse oximeter and return demonstration;Maintenance of O2 saturations>88%             Oxygen  Re-Evaluation:  Oxygen Re-Evaluation     Row Name 11/06/23 1028 12/16/23 0902 01/08/24 1101         Program Oxygen Prescription   Program Oxygen Prescription -- None None       Home Oxygen   Home Oxygen Device -- None None     Sleep Oxygen Prescription -- None None     Home Exercise Oxygen Prescription -- None None     Home Resting Oxygen Prescription -- None None       Goals/Expected Outcomes   Short Term Goals -- To learn and understand importance of maintaining oxygen saturations>88%;To learn and understand importance of monitoring SPO2 with pulse oximeter and demonstrate accurate use of the pulse oximeter.;To learn  and demonstrate proper pursed lip breathing techniques or other breathing techniques.  To learn and understand importance of maintaining oxygen saturations>88%;To learn and understand importance of monitoring SPO2 with pulse oximeter and demonstrate accurate use of the pulse oximeter.;To learn and demonstrate proper pursed lip breathing techniques or other breathing techniques.      Long  Term Goals -- Exhibits proper breathing techniques, such as pursed lip breathing or other method taught during program session;Verbalizes importance of monitoring SPO2 with pulse oximeter and return demonstration;Maintenance of O2 saturations>88% Exhibits proper breathing techniques, such as pursed lip breathing or other method taught during program session;Verbalizes importance of monitoring SPO2 with pulse oximeter and return demonstration;Maintenance of O2 saturations>88%     Goals/Expected Outcomes Compliance and understanding of oxygen saturation and breathing techniques to decrease shortness of breath. Compliance and understanding of oxygen saturation and breathing techniques to decrease shortness of breath. Compliance and understanding of oxygen saturation and breathing techniques to decrease shortness of breath.              Oxygen Discharge (Final Oxygen Re-Evaluation):  Oxygen  Re-Evaluation - 01/08/24 1101       Program Oxygen Prescription   Program Oxygen Prescription None      Home Oxygen   Home Oxygen Device None    Sleep Oxygen Prescription None    Home Exercise Oxygen Prescription None    Home Resting Oxygen Prescription None      Goals/Expected Outcomes   Short Term Goals To learn and understand importance of maintaining oxygen saturations>88%;To learn and understand importance of monitoring SPO2 with pulse oximeter and demonstrate accurate use of the pulse oximeter.;To learn and demonstrate proper pursed lip breathing techniques or other breathing techniques.     Long  Term Goals Exhibits proper breathing techniques, such as pursed lip breathing or other method taught during program session;Verbalizes importance of monitoring SPO2 with pulse oximeter and return demonstration;Maintenance of O2 saturations>88%    Goals/Expected Outcomes Compliance and understanding of oxygen saturation and breathing techniques to decrease shortness of breath.             Initial Exercise Prescription:  Initial Exercise Prescription - 11/06/23 1100       Date of Initial Exercise RX and Referring Provider   Date 11/06/23    Referring Provider Hilty    Expected Discharge Date 01/30/24      Treadmill   MPH 2.7    Grade 1    Minutes 15    METs 3.9      NuStep   Level 3    SPM 70    Minutes 15    METs 3.5      Prescription Details   Frequency (times per week) 2    Duration Progress to 30 minutes of continuous aerobic without signs/symptoms of physical distress      Intensity   THRR 40-80% of Max Heartrate 62-125    Ratings of Perceived Exertion 11-13    Perceived Dyspnea 0-4      Progression   Progression Continue to progress workloads to maintain intensity without signs/symptoms of physical distress.      Resistance Training   Training Prescription Yes    Weight --   black bands   Reps 10-15             Perform Capillary Blood Glucose  checks as needed.  Exercise Prescription Changes:   Exercise Prescription Changes     Row Name 11/19/23 1200 12/03/23 1100 12/12/23 1023 12/31/23 1200  01/09/24 1500     Response to Exercise   Blood Pressure (Admit) 110/58 102/60 113/74 100/64 --   Blood Pressure (Exercise) 132/76 138/72 -- 148/78 --   Blood Pressure (Exit) 102/64 94/60 100/60 112/70 --   Heart Rate (Admit) 70 bpm 80 bpm 74 bpm 73 bpm --   Heart Rate (Exercise) 119 bpm 121 bpm 110 bpm 97 bpm --   Heart Rate (Exit) 86 bpm 93 bpm 74 bpm 82 bpm --   Oxygen Saturation (Admit) 97 % 97 % 98 % 98 % --   Oxygen Saturation (Exercise) 95 % 96 % 95 % 97 % --   Oxygen Saturation (Exit) 95 % 96 % 96 % 96 % --   Rating of Perceived Exertion (Exercise) 12 12 12 12  --   Perceived Dyspnea (Exercise) 1 1 1 1  --   Duration Progress to 30 minutes of  aerobic without signs/symptoms of physical distress Continue with 30 min of aerobic exercise without signs/symptoms of physical distress. Continue with 30 min of aerobic exercise without signs/symptoms of physical distress. Continue with 30 min of aerobic exercise without signs/symptoms of physical distress. --   Intensity THRR unchanged THRR unchanged THRR unchanged THRR unchanged --     Progression   Progression Continue to progress workloads to maintain intensity without signs/symptoms of physical distress. Continue to progress workloads to maintain intensity without signs/symptoms of physical distress. Continue to progress workloads to maintain intensity without signs/symptoms of physical distress. Continue to progress workloads to maintain intensity without signs/symptoms of physical distress. --     Paramedic Prescription -- Yes Yes Yes --   Weight -- black bands black bands black bands --   Reps -- 10-15 10-15 10-15 --   Time -- 10 Minutes 10 Minutes 10 Minutes --     Treadmill   MPH 3 3 3 3  --   Grade 1.5 1.5 1.5 1.5 --   Minutes 15 15 15 15  --   METs 3.5 3.5  3.5 3.5 --     NuStep   Level 4 4 4 4  --   SPM 116 116 115 -- --   Minutes 15 15 15 15  --   METs 4.8 4.6 4.4 4.2 --     Home Exercise Plan   Plans to continue exercise at -- -- -- -- Home (comment)   Frequency -- -- -- -- Add 2 additional days to program exercise sessions.   Initial Home Exercises Provided -- -- -- -- 01/09/24    Row Name 01/14/24 1200             Response to Exercise   Blood Pressure (Admit) 108/58       Blood Pressure (Exercise) 140/70       Blood Pressure (Exit) 106/70       Heart Rate (Admit) 77 bpm       Heart Rate (Exercise) 100 bpm       Heart Rate (Exit) 82 bpm       Oxygen Saturation (Admit) 97 %       Oxygen Saturation (Exercise) 92 %       Oxygen Saturation (Exit) 95 %       Rating of Perceived Exertion (Exercise) 12       Perceived Dyspnea (Exercise) 1       Duration Continue with 30 min of aerobic exercise without signs/symptoms of physical distress.       Intensity THRR unchanged  Progression   Progression Continue to progress workloads to maintain intensity without signs/symptoms of physical distress.         Resistance Training   Training Prescription Yes       Weight black bands       Reps 10-15       Time 10 Minutes         Treadmill   MPH 3       Grade 1.5       Minutes 15       METs 3.5         NuStep   Level 5       Minutes 15       METs 4.1                Exercise Comments:   Exercise Comments     Row Name 11/12/23 1155 01/09/24 1523         Exercise Comments Alan White completed his first day of exercise. He exercised for 15 min on the Nustep and treadmill. Kelly averaged 4.4 METs at level 3 on the Nustep and 4.3 METs at 3.5 mph and 1.5% on the treadmill. Kelly performed the warmup and cooldown standing without limitations. Discussed METs. Completed home exercise plan. Alan White is currently exercising at home. He walks 2 days/wk for 14-20 min/day. I encouraged him to increase his frequency and time. Alan White  agreed with my recommendations. He is very motivated to exercise and improve his functional capacity. I am confident in [place] carrying out an exercise regimen at home.               Exercise Goals and Review:   Exercise Goals     Row Name 11/06/23 1027             Exercise Goals   Increase Physical Activity Yes       Intervention Provide advice, education, support and counseling about physical activity/exercise needs.;Develop an individualized exercise prescription for aerobic and resistive training based on initial evaluation findings, risk stratification, comorbidities and participant's personal goals.       Expected Outcomes Short Term: Attend rehab on a regular basis to increase amount of physical activity.;Long Term: Exercising regularly at least 3-5 days a week.;Long Term: Add in home exercise to make exercise part of routine and to increase amount of physical activity.       Increase Strength and Stamina Yes       Intervention Provide advice, education, support and counseling about physical activity/exercise needs.;Develop an individualized exercise prescription for aerobic and resistive training based on initial evaluation findings, risk stratification, comorbidities and participant's personal goals.       Expected Outcomes Short Term: Increase workloads from initial exercise prescription for resistance, speed, and METs.;Short Term: Perform resistance training exercises routinely during rehab and add in resistance training at home;Long Term: Improve cardiorespiratory fitness, muscular endurance and strength as measured by increased METs and functional capacity ( )       Able to understand and use rate of perceived exertion (RPE) scale Yes       Intervention Provide education and explanation on how to use RPE scale       Expected Outcomes Short Term: Able to use RPE daily in rehab to express subjective intensity level;Long Term:  Able to use RPE to guide intensity level when  exercising independently       Able to understand and use Dyspnea scale Yes       Intervention Provide  education and explanation on how to use Dyspnea scale       Expected Outcomes Short Term: Able to use Dyspnea scale daily in rehab to express subjective sense of shortness of breath during exertion;Long Term: Able to use Dyspnea scale to guide intensity level when exercising independently       Able to check pulse independently Yes       Intervention Provide education and demonstration on how to check pulse in carotid and radial arteries.;Review the importance of being able to check your own pulse for safety during independent exercise       Expected Outcomes Short Term: Able to explain why pulse checking is important during independent exercise;Long Term: Able to check pulse independently and accurately       Understanding of Exercise Prescription Yes       Intervention Provide education, explanation, and written materials on patient's individual exercise prescription       Expected Outcomes Short Term: Able to explain program exercise prescription;Long Term: Able to explain home exercise prescription to exercise independently                Exercise Goals Re-Evaluation :  Exercise Goals Re-Evaluation     Row Name 11/11/23 1147 12/16/23 0900 01/08/24 1057         Exercise Goal Re-Evaluation   Exercise Goals Review Increase Physical Activity;Able to understand and use Dyspnea scale;Understanding of Exercise Prescription;Increase Strength and Stamina;Knowledge and understanding of Target Heart Rate Range (THRR);Able to understand and use rate of perceived exertion (RPE) scale Increase Physical Activity;Able to understand and use Dyspnea scale;Understanding of Exercise Prescription;Increase Strength and Stamina;Knowledge and understanding of Target Heart Rate Range (THRR);Able to understand and use rate of perceived exertion (RPE) scale Increase Physical Activity;Able to understand and use  Dyspnea scale;Understanding of Exercise Prescription;Increase Strength and Stamina;Knowledge and understanding of Target Heart Rate Range (THRR);Able to understand and use rate of perceived exertion (RPE) scale     Comments Alan White is scheduled to start Pulmonary Rehab on 2/11. Will continue to monitor and progress as able. Alan White has completed10 exercise sessions. He exercises for 15 min on the Nustep and treadmill. Kelly averages 4.4 METs at level 4 on the Nustep and 3.5 at 3.0 mph and 1.5% incline on the treadmill. Alan White has increased his level and speed and incline on the treadmill. His METs have also increased. Alan White seems motivated to exercise and improve his functional capacity. Will continue to monitor and progress as able. Alan White has completed 115 exercise sessions. He exercises for 15 min on the Nustep and treadmill. He performs the warmup and cooldown standing without limitations. Kelly averages 4.4 METs at level 5 on the Nustep and 3.5 at 3.0 mph and 1.5% incline on the treadmill. Alan White has increased his level on the Nustep. METs have remained relatively the same. Alan White seems very motivated to exercise and improve his functional capacity. He possibly reaching his max functional capacity. Will continue to monitor and progress as able.     Expected Outcomes Through exercise at rehab and home, the patient will decrease shortness of breath and feel confident in carrying out an exercise regimen at home. Through exercise at rehab and home, the patient will decrease shortness of breath and feel confident in carrying out an exercise regimen at home. Through exercise at rehab and home, the patient will decrease shortness of breath and feel confident in carrying out an exercise regimen at home.  Discharge Exercise Prescription (Final Exercise Prescription Changes):  Exercise Prescription Changes - 01/14/24 1200       Response to Exercise   Blood Pressure (Admit) 108/58    Blood Pressure  (Exercise) 140/70    Blood Pressure (Exit) 106/70    Heart Rate (Admit) 77 bpm    Heart Rate (Exercise) 100 bpm    Heart Rate (Exit) 82 bpm    Oxygen Saturation (Admit) 97 %    Oxygen Saturation (Exercise) 92 %    Oxygen Saturation (Exit) 95 %    Rating of Perceived Exertion (Exercise) 12    Perceived Dyspnea (Exercise) 1    Duration Continue with 30 min of aerobic exercise without signs/symptoms of physical distress.    Intensity THRR unchanged      Progression   Progression Continue to progress workloads to maintain intensity without signs/symptoms of physical distress.      Resistance Training   Training Prescription Yes    Weight black bands    Reps 10-15    Time 10 Minutes      Treadmill   MPH 3    Grade 1.5    Minutes 15    METs 3.5      NuStep   Level 5    Minutes 15    METs 4.1             Nutrition:  Target Goals: Understanding of nutrition guidelines, daily intake of sodium 1500mg , cholesterol 200mg , calories 30% from fat and 7% or less from saturated fats, daily to have 5 or more servings of fruits and vegetables.  Biometrics:  Pre Biometrics - 11/06/23 1149       Pre Biometrics   Grip Strength 36 kg              Nutrition Therapy Plan and Nutrition Goals:  Nutrition Therapy & Goals - 01/07/24 1135       Nutrition Therapy   Diet Heart Healthy Diet    Drug/Food Interactions Statins/Certain Fruits      Personal Nutrition Goals   Nutrition Goal Patient to improve diet quality by using the plate method as a guide for meal planning to include lean protein/plant protein, fruits, vegetables, whole grains, nonfat dairy as part of a well-balanced diet.   goal in progress   Personal Goal #2 Patient to identify food sources and limit daily intake of saturated fat, trans fat, sodium, and refined carbohydrates.   goal in progress.   Personal Goal #3 Patient to reduce sodium intake to 1500mg  per day   goal in progress.   Comments Goals in action.  Alan White was previously enrolled in intensive cardiac rehab in June of 2024 but was unable to finish the program due to gallbladder surgery. He has started making many dietary changes including reduced mountain dew from 5/day to 3/day, drastically reduced fried foods and decreased portions of meat, reduced snacking on refined carbohydrates (chips) and switched to fruit, and increased non-starchy vegetables at dinner daily. Alan White remains motivated to continue to improve food choices. He quit smoking in January 2025. He is up 2.6# since starting pulmonary rehab. He started repatha for improved lipid control.  [facility]. Alan White will benefit from participation in pulmonary rehab for nutrition, exercise, and lifestyle modification.      Intervention Plan   Intervention Prescribe, educate and counsel regarding individualized specific dietary modifications aiming towards targeted core components such as weight, hypertension, lipid management, diabetes, heart failure and  other comorbidities.;Nutrition handout(s) given to patient.    Expected Outcomes Short Term Goal: Understand basic principles of dietary content, such as calories, fat, sodium, cholesterol and nutrients.;Long Term Goal: Adherence to prescribed nutrition plan.             Nutrition Assessments:  MEDIFICTS Score Key: >=70 Need to make dietary changes  40-70 Heart Healthy Diet <= 40 Therapeutic Level Cholesterol Diet  Flowsheet Row INTENSIVE CARDIAC REHAB from 03/04/2023 in [facility] for Heart, Vascular, & Lung Health  Picture Your Plate Total Score on Admission 40      Picture Your Plate Scores: <16 Unhealthy dietary pattern with much room for improvement. 41-50 Dietary pattern unlikely to meet recommendations for good health and room for improvement. 51-60 More healthful dietary pattern, with some room for improvement.  >60 Healthy dietary pattern, although  there may be some specific behaviors that could be improved.    Nutrition Goals Re-Evaluation:  Nutrition Goals Re-Evaluation     Row Name 12/10/23 1024 01/07/24 1135           Goals   Current Weight 214 lb 4.6 oz (97.2 kg) 214 lb 1.1 oz (97.1 kg)      Comment LDL 78, HDL 33 no new labs; most recent labs  LDL 78, HDL 33      Expected Outcome Goals in action. Alan White was previously enrolled in intensive cardiac rehab in June of 2024 but was unable to finish the program due to gallbladder surgery. He has started making many dietary changes including reduced mountain dew from 5/day to 3/day, drastically reduced fried foods and decreased portions of meat, reduced snacking on refined carbohydrates (chips) and switched to fruit, and increased non-starchy vegetables at dinner daily. Alan White remains motivated to continue to improve food choices. He quit smoking in January 2025. He started repatha for improved lipid control. [facility] Alan White will benefit from participation in pulmonary rehab for nutrition, exercise, and lifestyle modification. Goals in action. Alan White was previously enrolled in intensive cardiac rehab in June of 2024 but was unable to finish the program due to gallbladder surgery. He has started making many dietary changes including reduced mountain dew from 5/day to 3/day, drastically reduced fried foods and decreased portions of meat, reduced snacking on refined carbohydrates (chips) and switched to fruit, and increased non-starchy vegetables at dinner daily. Alan White remains motivated to continue to improve food choices. He quit smoking in January 2025. He is up 2.6# since starting pulmonary rehab. He started repatha for improved lipid control. [facility]. Alan White will benefit from participation in pulmonary rehab for nutrition, exercise, and lifestyle modification.               Nutrition  Goals Discharge (Final Nutrition Goals Re-Evaluation):  Nutrition Goals Re-Evaluation - 01/07/24 1135       Goals   Current Weight 214 lb 1.1 oz (97.1 kg)    Comment no new labs; most recent labs  LDL 78, HDL 33    Expected Outcome Goals in action. Alan White was previously enrolled in intensive cardiac rehab in June of 2024 but was unable to finish the program due to gallbladder surgery. He has started making many dietary changes including reduced mountain dew from 5/day to 3/day, drastically reduced fried foods and decreased portions of meat, reduced snacking on refined carbohydrates (chips) and switched to fruit, and increased non-starchy vegetables at dinner daily. Alan White remains  motivated to continue to improve food choices. He quit smoking in January 2025. He is up 2.6# since starting pulmonary rehab. He started repatha for improved lipid control. [facility]. Alan White will benefit from participation in pulmonary rehab for nutrition, exercise, and lifestyle modification.             Psychosocial: Target Goals: Acknowledge presence or absence of significant depression and/or stress, maximize coping skills, provide positive support system. Participant is able to verbalize types and ability to use techniques and skills needed for reducing stress and depression.  Initial Review & Psychosocial Screening:  Initial Psych Review & Screening - 11/06/23 1029       Initial Review   Current issues with None Identified      Family Dynamics   Good Support System? Yes    Comments Patient has a very supportive wife and family, children.      Barriers   Psychosocial barriers to participate in program There are no identifiable barriers or psychosocial needs.      Screening Interventions   Interventions Encouraged to exercise    Expected Outcomes Long Term Goal: Stressors or current issues are controlled or eliminated.;Short Term goal: Identification and  review with participant of any Quality of Life or Depression concerns found by scoring the questionnaire.;Long Term goal: The participant improves quality of Life and PHQ9 Scores as seen by post scores and/or verbalization of changes;Short Term goal: Utilizing psychosocial counselor, staff and physician to assist with identification of specific Stressors or current issues interfering with healing process. Setting desired goal for each stressor or current issue identified.             Quality of Life Scores:  Scores of 19 and below usually indicate a poorer quality of life in these areas.  A difference of  2-3 points is a clinically meaningful difference.  A difference of 2-3 points in the total score of the Quality of Life Index has been associated with significant improvement in overall quality of life, self-image, physical symptoms, and general health in studies assessing change in quality of life.  PHQ-9: Review Flowsheet       11/06/2023 01/01/2023 03/22/2014  Depression screen PHQ 2/9  Decreased Interest 1 2 0  Down, Depressed, Hopeless 0 2 0  PHQ - 2 Score 1 4 0  Altered sleeping 0 2 -  Tired, decreased energy 1 2 -  Change in appetite 0 2 -  Feeling bad or failure about yourself  1 2 -  Trouble concentrating 0 0 -  Moving slowly or fidgety/restless 0 0 -  Suicidal thoughts 0 0 -  PHQ-9 Score 3 12 -  Difficult doing work/chores Somewhat difficult Somewhat difficult -   Interpretation of Total Score  Total Score Depression Severity:  1-4 = Minimal depression, 5-9 = Mild depression, 10-14 = Moderate depression, 15-19 = Moderately severe depression, 20-27 = Severe depression   Psychosocial Evaluation and Intervention:  Psychosocial Evaluation - 11/06/23 1030       Psychosocial Evaluation & Interventions   Interventions Encouraged to exercise with the program and follow exercise prescription    Comments Alan White denies any psy/soc barriers or concerns    Expected Outcomes For  Kelly to exercise in PR free of any psy/soc barriers or concerns    Continue Psychosocial Services  No Follow up required             Psychosocial Re-Evaluation:  Psychosocial Re-Evaluation  Row Name 11/13/23 1402 12/11/23 1130 01/08/24 0939         Psychosocial Re-Evaluation   Current issues with None Identified None Identified None Identified     Comments Loetta Ringer continues to deny any psychosocial barriers or concerns at this time. Loetta Ringer continues to deny any psychosocial barriers or concerns at this time. Loetta Ringer continues to deny any psychosocial barriers or concerns at this time.     Expected Outcomes For Loetta Ringer to participate in PR free of any psychosocial barriers or concerns For Loetta Ringer to participate in PR free of any psychosocial barriers or concerns For Loetta Ringer to participate in PR free of any psychosocial barriers or concerns     Interventions Encouraged to attend Pulmonary Rehabilitation for the exercise Encouraged to attend Pulmonary Rehabilitation for the exercise Encouraged to attend Pulmonary Rehabilitation for the exercise     Continue Psychosocial Services  No Follow up required No Follow up required No Follow up required              Psychosocial Discharge (Final Psychosocial Re-Evaluation):  Psychosocial Re-Evaluation - 01/08/24 0939       Psychosocial Re-Evaluation   Current issues with None Identified    Comments Loetta Ringer continues to deny any psychosocial barriers or concerns at this time.    Expected Outcomes For Loetta Ringer to participate in PR free of any psychosocial barriers or concerns    Interventions Encouraged to attend Pulmonary Rehabilitation for the exercise    Continue Psychosocial Services  No Follow up required             Education: Education Goals: Education classes will be provided on a weekly basis, covering required topics. Participant will state understanding/return demonstration of topics presented.  Learning Barriers/Preferences:   Learning Barriers/Preferences - 11/06/23 1031       Learning Barriers/Preferences   Learning Barriers None    Learning Preferences Group Instruction;Individual Instruction;Written Material             Education Topics: Know Your Numbers Group instruction that is supported by a PowerPoint presentation. Instructor discusses importance of knowing and understanding resting, exercise, and post-exercise oxygen saturation, heart rate, and blood pressure. Oxygen saturation, heart rate, blood pressure, rating of perceived exertion, and dyspnea are reviewed along with a normal range for these values.    Exercise for the Pulmonary Patient Group instruction that is supported by a PowerPoint presentation. Instructor discusses benefits of exercise, core components of exercise, frequency, duration, and intensity of an exercise routine, importance of utilizing pulse oximetry during exercise, safety while exercising, and options of places to exercise outside of rehab.  Flowsheet Row PULMONARY REHAB OTHER RESPIRATORY from 12/26/2023 in [facility] for Heart, Vascular, & Lung Health  Date 12/26/23  Educator EP  Instruction Review Code 1- Verbalizes Understanding       MET Level  Group instruction provided by PowerPoint, verbal discussion, and written material to support subject matter. Instructor reviews what METs are and how to increase METs.  Flowsheet Row PULMONARY REHAB OTHER RESPIRATORY from 11/21/2023 in [facility] for Heart, Vascular, & Lung Health  Date 11/21/23  Educator EP  Instruction Review Code 1- Verbalizes Understanding       Pulmonary Medications Verbally interactive group education provided by instructor with focus on inhaled medications and proper administration.   Anatomy and Physiology of the Respiratory System Group instruction provided by PowerPoint, verbal discussion, and written material to support subject matter.  Instructor reviews respiratory cycle and anatomical  components of the respiratory system and their functions. Instructor also reviews differences in obstructive and restrictive respiratory diseases with examples of each.    Oxygen Safety Group instruction provided by PowerPoint, verbal discussion, and written material to support subject matter. There is an overview of "What is Oxygen" and "Why do we need it".  Instructor also reviews how to create a safe environment for oxygen use, the importance of using oxygen as prescribed, and the risks of noncompliance. There is a brief discussion on traveling with oxygen and resources the patient may utilize. Flowsheet Row PULMONARY REHAB OTHER RESPIRATORY from 01/09/2024 in [facility] for Heart, Vascular, & Lung Health  Date 01/09/24  Educator RN  Instruction Review Code 1- Verbalizes Understanding       Oxygen Use Group instruction provided by PowerPoint, verbal discussion, and written material to discuss how supplemental oxygen is prescribed and different types of oxygen supply systems. Resources for more information are provided.    Breathing Techniques Group instruction that is supported by demonstration and informational handouts. Instructor discusses the benefits of pursed lip and diaphragmatic breathing and detailed demonstration on how to perform both.     Risk Factor Reduction Group instruction that is supported by a PowerPoint presentation. Instructor discusses the definition of a risk factor, different risk factors for pulmonary disease, and how the heart and lungs work together. Flowsheet Row PULMONARY REHAB OTHER RESPIRATORY from 11/14/2023 in [facility] for Heart, Vascular, & Lung Health  Date 11/14/23  Educator EP  Instruction Review Code 1- Verbalizes Understanding       Pulmonary Diseases Group instruction provided by PowerPoint, verbal discussion, and written material to support  subject matter. Instructor gives an overview of the different type of pulmonary diseases. There is also a discussion on risk factors and symptoms as well as ways to manage the diseases.   Stress and Energy Conservation Group instruction provided by PowerPoint, verbal discussion, and written material to support subject matter. Instructor gives an overview of stress and the impact it can have on the body. Instructor also reviews ways to reduce stress. There is also a discussion on energy conservation and ways to conserve energy throughout the day.   Warning Signs and Symptoms Group instruction provided by PowerPoint, verbal discussion, and written material to support subject matter. Instructor reviews warning signs and symptoms of stroke, heart attack, cold and flu. Instructor also reviews ways to prevent the spread of infection.   Other Education Group or individual verbal, written, or video instructions that support the educational goals of the pulmonary rehab program. Flowsheet Row PULMONARY REHAB OTHER RESPIRATORY from 12/12/2023 in [facility] for Heart, Vascular, & Lung Health  Date 12/12/23  Educator EP  Instruction Review Code 1- Verbalizes Understanding        Knowledge Questionnaire Score:   Core Components/Risk Factors/Patient Goals at Admission:  Personal Goals and Risk Factors at Admission - 11/06/23 1031       Core Components/Risk Factors/Patient Goals on Admission   Improve shortness of breath with ADL's Yes    Intervention Provide education, individualized exercise plan and daily activity instruction to help decrease symptoms of SOB with activities of daily living.    Expected Outcomes Short Term: Improve cardiorespiratory fitness to achieve a reduction of symptoms when performing ADLs;Long Term: Be able to perform more ADLs without symptoms or delay the onset of symptoms             Core Components/Risk  Factors/Patient Goals Review:    Goals and Risk Factor Review     Row Name 11/13/23 1404 12/11/23 1130 01/08/24 0939         Core Components/Risk Factors/Patient Goals Review   Personal Goals Review Improve shortness of breath with ADL's;Develop more efficient breathing techniques such as purse lipped breathing and diaphragmatic breathing and practicing self-pacing with activity.;Tobacco Cessation Improve shortness of breath with ADL's;Develop more efficient breathing techniques such as purse lipped breathing and diaphragmatic breathing and practicing self-pacing with activity. Improve shortness of breath with ADL's;Develop more efficient breathing techniques such as purse lipped breathing and diaphragmatic breathing and practicing self-pacing with activity.     Review Alan White has attended one session so far. Goal progressing for improving shortness of breath with ADL's. Goal progressing for developing more efficient breathing techniques such as purse lipped breathing and diaphragmatic breathing; and practicing self-pacing with activity. Goal met for tobacco cessation. Kelly quit smoking one week prior to starting class. He states he is continuing to refrain from smoking, but it's the hardest thing he's ever done. Staff continues to encourage [place] on his success with quitting smoking. We will continue to monitor Kelly's progress throughout the program. Goal progressing for improving shortness of breath with ADL's. Goal progressing for developing more efficient breathing techniques such as purse lipped breathing and diaphragmatic breathing; and practicing self-pacing with activity. Alan White is currently exercising on RA to keep sats >88%. He is currently exercising on the Nustep and the treadmill. He has been able to increase his METs and workload. We will continue to monitor Kelly's progress throughout the program. Monthly review of patient's Core Components/Risk Factors/Patient Goals are as follows: Goal progressing for improving shortness of  breath with ADL's. Goal met for developing more efficient breathing techniques such as purse lipped breathing and diaphragmatic breathing; and practicing self-pacing with activity. Alan White is able to demonstrate purse lip breathing when he gets short of breath. He also knows how to pace himself based on his rate of perceived exertion score. Alan White is currently exercising on RA to keep sats >88%. He is currently exercising on the Nustep and the treadmill. He has been able to increase his METs and workload. We will continue to monitor Kelly's progress throughout the program.     Expected Outcomes To improve shortness of breath with ADL's and develop more efficient breathing techniques such as purse lipped breathing and diaphragmatic breathing; and practicing self-pacing with activity. To improve shortness of breath with ADL's and develop more efficient breathing techniques such as purse lipped breathing and diaphragmatic breathing; and practicing self-pacing with activity. To improve shortness of breath with ADL's              Core Components/Risk Factors/Patient Goals at Discharge (Final Review):   Goals and Risk Factor Review - 01/08/24 0939       Core Components/Risk Factors/Patient Goals Review   Personal Goals Review Improve shortness of breath with ADL's;Develop more efficient breathing techniques such as purse lipped breathing and diaphragmatic breathing and practicing self-pacing with activity.    Review Monthly review of patient's Core Components/Risk Factors/Patient Goals are as follows: Goal progressing for improving shortness of breath with ADL's. Goal met for developing more efficient breathing techniques such as purse lipped breathing and diaphragmatic breathing; and practicing self-pacing with activity. Alan White is able to demonstrate purse lip breathing when he gets short of breath. He also knows how to pace himself based on his rate of perceived exertion score. Alan White is currently exercising  on RA to keep sats >88%. He is currently exercising on the Nustep and the treadmill. He has been able to increase his METs and workload. We will continue to monitor Kelly's progress throughout the program.    Expected Outcomes To improve shortness of breath with ADL's             ITP Comments: Pt is making expected progress toward Pulmonary Rehab goals after completing 17 session(s). Recommend continued exercise, life style modification, education, and utilization of breathing techniques to increase stamina and strength, while also decreasing shortness of breath with exertion.  Dr. Genetta Kenning is Medical Director for Pulmonary Rehab at [facility].

## 2024-01-16 ENCOUNTER — Telehealth (HOSPITAL_COMMUNITY): Payer: Self-pay | Admitting: *Deleted

## 2024-01-16 ENCOUNTER — Encounter (HOSPITAL_COMMUNITY): Admission: RE | Admit: 2024-01-16 | Payer: PPO | Source: Ambulatory Visit

## 2024-01-16 NOTE — Telephone Encounter (Signed)
 Pt LVM stating he has a head cold and will not attend class today.  [name] BS, ACSM-CEP 01/16/2024 7:50 AM

## 2024-01-21 ENCOUNTER — Encounter (HOSPITAL_COMMUNITY): Admission: RE | Admit: 2024-01-21 | Payer: PPO | Source: Ambulatory Visit

## 2024-01-21 DIAGNOSIS — J209 Acute bronchitis, unspecified: Secondary | ICD-10-CM | POA: Diagnosis not present

## 2024-01-21 DIAGNOSIS — C44629 Squamous cell carcinoma of skin of left upper limb, including shoulder: Secondary | ICD-10-CM | POA: Diagnosis not present

## 2024-01-22 ENCOUNTER — Telehealth (HOSPITAL_COMMUNITY): Payer: Self-pay

## 2024-01-22 NOTE — Telephone Encounter (Signed)
 Called pt to check on him since he called out sick on 01/21/24. No answer. Left VM.

## 2024-01-22 NOTE — Telephone Encounter (Signed)
 Patient called stating he is still sick and won't be in tomorrow for class. States he saw PCP and is on antibiotic, has had a fever the past 3 days but no fever this morning. Hopes to be in for class on Tuesday.

## 2024-01-22 NOTE — Telephone Encounter (Signed)
 Called Alan White to see if he wanted to make up any missed sessions. Alan White stated that he does not want to make up sessions and will finish PR next week.

## 2024-01-23 ENCOUNTER — Encounter (HOSPITAL_COMMUNITY): Payer: PPO

## 2024-01-28 ENCOUNTER — Encounter (HOSPITAL_COMMUNITY)
Admission: RE | Admit: 2024-01-28 | Discharge: 2024-01-28 | Disposition: A | Discharge status: Home / Self Care | Payer: PPO | Source: Ambulatory Visit | Attending: Internal Medicine | Admitting: Internal Medicine

## 2024-01-28 VITALS — Wt 209.0 lb

## 2024-01-28 DIAGNOSIS — I5022 Chronic systolic (congestive) heart failure: Secondary | ICD-10-CM

## 2024-01-28 NOTE — Progress Notes (Signed)
 Daily Session Note  Patient Details  Name: Alan White MRN: [id number] Date of Birth: 1961/04/14 Referring Provider:   Gattis Kass Pulmonary Rehab Walk Test from 11/06/2023 in [facility] for Heart, Vascular, & Lung Health  Referring Provider [place]       Encounter Date: 01/28/2024  Check In:  Session Check In - 01/28/24 1048       Check-In   Supervising physician immediately available to respond to emergencies [facility] MD immediately available    Physician(s) Lawana Pray, NP    Location [facility]    Staff Present Atlas Lea, MS, ACSM-CEP, Exercise Physiologist;Randi Rochelle Chu, ACSM-CEP, Exercise Physiologist;Athel Merriweather Arlester Ladd, RN, Angie Kenner, RT    Virtual Visit No    Medication changes reported     No    Fall or balance concerns reported    No    Tobacco Cessation No Change    Warm-up and Cool-down Performed as group-led instruction    Resistance Training Performed Yes    VAD Patient? No    PAD/SET Patient? No      Pain Assessment   Currently in Pain? No/denies    Multiple Pain Sites No             Capillary Blood Glucose: No results found for this or any previous visit (from the past 24 hours).   Exercise Prescription Changes - 01/28/24 1100       Response to Exercise   Blood Pressure (Admit) 112/72    Blood Pressure (Exit) 92/66    Heart Rate (Admit) 77 bpm    Heart Rate (Exercise) 113 bpm    Heart Rate (Exit) 90 bpm    Oxygen Saturation (Admit) 97 %    Oxygen Saturation (Exercise) 96 %    Oxygen Saturation (Exit) 95 %    Rating of Perceived Exertion (Exercise) 12    Perceived Dyspnea (Exercise) 1    Duration Continue with 30 min of aerobic exercise without signs/symptoms of physical distress.    Intensity THRR unchanged      Progression   Progression Continue to progress workloads to maintain intensity without signs/symptoms of physical distress.      Resistance Training   Training Prescription Yes     Weight black bands    Reps 10-15    Time 10 Minutes      Treadmill   MPH 3    Grade 1.5    Minutes 15    METs 3.92             Social History   Tobacco Use  Smoking Status Former   Current packs/day: 0.00   Average packs/day: 0.5 packs/day for 50.0 years (25.0 ttl pk-yrs)   Types: Cigarettes   Quit date: 10/14/2023   Years since quitting: 0.2  Smokeless Tobacco Never    Goals Met:  Independence with exercise equipment Exercise tolerated well No report of concerns or symptoms today Strength training completed today  Goals Unmet:  Not Applicable  Comments: Service time is from 1014 to 1136    Dr. Genetta Kenning is Medical Director for Pulmonary Rehab at [facility]

## 2024-01-30 ENCOUNTER — Encounter (HOSPITAL_COMMUNITY)
Admission: RE | Admit: 2024-01-30 | Discharge: 2024-01-30 | Disposition: A | Discharge status: Home / Self Care | Payer: PPO | Source: Ambulatory Visit | Attending: Internal Medicine | Admitting: Internal Medicine

## 2024-01-30 DIAGNOSIS — I5022 Chronic systolic (congestive) heart failure: Secondary | ICD-10-CM | POA: Insufficient documentation

## 2024-01-30 NOTE — Progress Notes (Signed)
 Daily Session Note  Patient Details  Name: Alan White MRN: [id number] Date of Birth: May 05, 1961 Referring Provider:   Gattis Kass Pulmonary Rehab Walk Test from 11/06/2023 in [facility]  Referring Provider Hilty       Encounter Date: 01/30/2024  Check In:  Session Check In - 01/30/24 1027       Check-In   Supervising physician immediately available to respond to emergencies [facility] MD immediately available    Physician(s) Koren Persons, NP    Location [facility]    Staff Present Atlas Lea, MS, ACSM-CEP, Exercise Physiologist;Randi Rochelle Chu, ACSM-CEP, Exercise Physiologist;Mary [place], RN, BSN;Kailin Principato, [place] Breen, RN, BSN    Virtual Visit No    Medication changes reported     No    Fall or balance concerns reported    No    Tobacco Cessation No Change    Warm-up and Cool-down Performed as group-led Writer Performed Yes    VAD Patient? No    PAD/SET Patient? No      Pain Assessment   Currently in Pain? No/denies    Pain Score 0-No pain    Multiple Pain Sites No             Capillary Blood Glucose: No results found for this or any previous visit (from the past 24 hours).    Social History   Tobacco Use  Smoking Status Former   Current packs/day: 0.00   Average packs/day: 0.5 packs/day for 50.0 years (25.0 ttl pk-yrs)   Types: Cigarettes   Quit date: 10/14/2023   Years since quitting: 0.2  Smokeless Tobacco Never    Goals Met:  Proper associated with RPD/PD & O2 Sat Independence with exercise equipment Exercise tolerated well No report of concerns or symptoms today Strength training completed today  Goals Unmet:  Not Applicable  Comments: Service time is from 1000 to 1139.    Dr. Genetta Kenning is Medical Director for Pulmonary Rehab at [facility].

## 2024-01-31 NOTE — Progress Notes (Signed)
 Discharge Progress Report  Patient Details  Name: Alan White MRN: [id number] Date of Birth: 03-Mar-1961 Referring Provider:   Gattis Kass Pulmonary Rehab Walk Test from 11/06/2023 in [facility]  Referring Provider Hilty        Number of Visits: 47  Reason for Discharge:  Patient reached a stable level of exercise. Patient independent in their exercise. Patient has met program and personal goals.  Smoking History:  Social History   Tobacco Use  Smoking Status Former   Current packs/day: 0.00   Average packs/day: 0.5 packs/day for 50.0 years (25.0 ttl pk-yrs)   Types: Cigarettes   Quit date: 10/14/2023   Years since quitting: 0.2  Smokeless Tobacco Never    Diagnosis:  Heart failure, chronic systolic (HCC)  ADL [facility]:  Pulmonary Assessment Scores     Row Name 11/06/23 1035 01/28/24 1535       ADL [facility]   ADL Phase Entry Exit    SOB Score total 13 19      CAT Score   CAT Score 10 14      mMRC Score   mMRC Score 1 1             Initial Exercise Prescription:  Initial Exercise Prescription - 11/06/23 1100       Date of Initial Exercise RX and Referring Provider   Date 11/06/23    Referring Provider Hilty    Expected Discharge Date 01/30/24      Treadmill   MPH 2.7    Grade 1    Minutes 15    METs 3.9      NuStep   Level 3    SPM 70    Minutes 15    METs 3.5      Prescription Details   Frequency (times per week) 2    Duration Progress to 30 minutes of continuous aerobic without signs/symptoms of physical distress      Intensity   THRR 40-80% of Max Heartrate 62-125    Ratings of Perceived Exertion 11-13    Perceived Dyspnea 0-4      Progression   Progression Continue to progress workloads to maintain intensity without signs/symptoms of physical distress.      Resistance Training   Training Prescription Yes    Weight --   black bands   Reps 10-15              Discharge Exercise Prescription (Final Exercise Prescription Changes):  Exercise Prescription Changes - 01/28/24 1100       Response to Exercise   Blood Pressure (Admit) 112/72    Blood Pressure (Exit) 92/66    Heart Rate (Admit) 77 bpm    Heart Rate (Exercise) 113 bpm    Heart Rate (Exit) 90 bpm    Oxygen Saturation (Admit) 97 %    Oxygen Saturation (Exercise) 96 %    Oxygen Saturation (Exit) 95 %    Rating of Perceived Exertion (Exercise) 12    Perceived Dyspnea (Exercise) 1    Duration Continue with 30 min of aerobic exercise without signs/symptoms of physical distress.    Intensity THRR unchanged      Progression   Progression Continue to progress workloads to maintain intensity without signs/symptoms of physical distress.      Resistance Training   Training Prescription Yes    Weight black bands    Reps 10-15    Time 10 Minutes  Treadmill   MPH 3    Grade 1.5    Minutes 15    METs 3.92             Functional Capacity:  6 Minute Walk     Row Name 11/06/23 1141 01/28/24 1528       6 Minute Walk   Phase Initial Discharge    Distance 1820 feet 1995 feet    Distance % Change -- 9.62 %    Distance Feet Change -- 175 ft    Walk Time 6 minutes 6 minutes    # of Rest Breaks 0 0    MPH 3.45 3.78    METS 3.95 4.7    RPE 9 12    Perceived Dyspnea  0.5 1    VO2 Peak 13.84 16.46    Symptoms No No    Resting HR 81 bpm 77 bpm    Resting BP 120/60 112/72    Resting Oxygen Saturation  95 % 99 %    Exercise Oxygen Saturation  during 6 min walk 94 % 96 %    Max Ex. HR 97 bpm 130 bpm    Max Ex. BP 110/64 128/68    2 Minute Post BP 108/64 120/70      Interval HR   1 Minute HR 83 101    2 Minute HR 83 109    3 Minute HR 97 109    4 Minute HR 97 130    5 Minute HR 97 116    6 Minute HR 97 116    2 Minute Post HR 92 94    Interval Heart Rate? Yes Yes      Interval Oxygen   Interval Oxygen? Yes Yes    Baseline Oxygen Saturation % 95 % 99 %    1  Minute Oxygen Saturation % 96 % 97 %    1 Minute Liters of Oxygen 0 L 0 L    2 Minute Oxygen Saturation % 94 % 96 %    2 Minute Liters of Oxygen 0 L 0 L    3 Minute Oxygen Saturation % 94 % 97 %    3 Minute Liters of Oxygen 0 L 0 L    4 Minute Oxygen Saturation % 94 % 96 %    4 Minute Liters of Oxygen 0 L 0 L    5 Minute Oxygen Saturation % 95 % 98 %    5 Minute Liters of Oxygen 0 L 0 L    6 Minute Oxygen Saturation % 95 % 98 %    6 Minute Liters of Oxygen 0 L 0 L    2 Minute Post Oxygen Saturation % 97 % 98 %    2 Minute Post Liters of Oxygen 0 L 0 L             Psychological, QOL, Others - Outcomes: PHQ 2/9:    01/28/2024    3:32 PM 11/06/2023   10:35 AM 01/01/2023   12:54 PM 03/22/2014    9:10 AM  Depression screen PHQ 2/9  Decreased Interest 1 1 2  0  Down, Depressed, Hopeless 1 0 2 0  PHQ - 2 Score 2 1 4  0  Altered sleeping 0 0 2   Tired, decreased energy 1 1 2    Change in appetite 1 0 2   Feeling bad or failure about yourself  0 1 2   Trouble concentrating 0 0 0   Moving slowly or fidgety/restless  0 0 0   Suicidal thoughts 0 0 0   PHQ-9 Score 4 3 12    Difficult doing work/chores Not difficult at all Somewhat difficult Somewhat difficult     Quality of Life:   Personal Goals: Goals established at orientation with interventions provided to work toward goal.  Personal Goals and Risk Factors at Admission - 11/06/23 1031       Core Components/Risk Factors/Patient Goals on Admission   Improve shortness of breath with ADL's Yes    Intervention Provide education, individualized exercise plan and daily activity instruction to help decrease symptoms of SOB with activities of daily living.    Expected Outcomes Short Term: Improve cardiorespiratory fitness to achieve a reduction of symptoms when performing ADLs;Long Term: Be able to perform more ADLs without symptoms or delay the onset of symptoms              Personal Goals Discharge:  Goals and Risk Factor Review      Row Name 11/13/23 1404 12/11/23 1130 01/08/24 0939 01/30/24 1208       Core Components/Risk Factors/Patient Goals Review   Personal Goals Review Improve shortness of breath with ADL's;Develop more efficient breathing techniques such as purse lipped breathing and diaphragmatic breathing and practicing self-pacing with activity.;Tobacco Cessation Improve shortness of breath with ADL's;Develop more efficient breathing techniques such as purse lipped breathing and diaphragmatic breathing and practicing self-pacing with activity. Improve shortness of breath with ADL's;Develop more efficient breathing techniques such as purse lipped breathing and diaphragmatic breathing and practicing self-pacing with activity. Improve shortness of breath with ADL's    Review Loetta Ringer has attended one session so far. Goal progressing for improving shortness of breath with ADL's. Goal progressing for developing more efficient breathing techniques such as purse lipped breathing and diaphragmatic breathing; and practicing self-pacing with activity. Goal met for tobacco cessation. Kelly quit smoking one week prior to starting class. He states he is continuing to refrain from smoking, but it's the hardest thing he's ever done. Staff continues to encourage [place] on his success with quitting smoking. We will continue to monitor Kelly's progress throughout the program. Goal progressing for improving shortness of breath with ADL's. Goal progressing for developing more efficient breathing techniques such as purse lipped breathing and diaphragmatic breathing; and practicing self-pacing with activity. Loetta Ringer is currently exercising on RA to keep sats >88%. He is currently exercising on the Nustep and the treadmill. He has been able to increase his METs and workload. We will continue to monitor Kelly's progress throughout the program. Monthly review of patient's Core Components/Risk Factors/Patient Goals are as follows: Goal progressing for  improving shortness of breath with ADL's. Goal met for developing more efficient breathing techniques such as purse lipped breathing and diaphragmatic breathing; and practicing self-pacing with activity. Loetta Ringer is able to demonstrate purse lip breathing when he gets short of breath. He also knows how to pace himself based on his rate of perceived exertion score. Loetta Ringer is currently exercising on RA to keep sats >88%. He is currently exercising on the Nustep and the treadmill. He has been able to increase his METs and workload. We will continue to monitor Kelly's progress throughout the program. Loetta Ringer graduated the [employer] program on 01/30/24 completing 19 sessions. Graduation review of patient's Core Components/Risk Factors/Patient Goals are as follows: Goal met for improving shortness of breath with ADL's. Kelly exercised on room air to keep sats >88%. He exercised on the Nustep and the treadmill. He maintained his METs and workload on  the NuStep and his speed and incline on the treadmill throughout the program. Though dyspnea and CAT scores increased, Loetta Ringer reported less shortness of breath at home and with exercise at graduation.    Expected Outcomes To improve shortness of breath with ADL's and develop more efficient breathing techniques such as purse lipped breathing and diaphragmatic breathing; and practicing self-pacing with activity. To improve shortness of breath with ADL's and develop more efficient breathing techniques such as purse lipped breathing and diaphragmatic breathing; and practicing self-pacing with activity. To improve shortness of breath with ADL's To continue to improve and maintain his shortness of breath with ADL's post Pulm Rehab             Exercise Goals and Review:  Exercise Goals     Row Name 11/06/23 1027             Exercise Goals   Increase Physical Activity Yes       Intervention Provide advice, education, support and counseling about physical activity/exercise  needs.;Develop an individualized exercise prescription for aerobic and resistive training based on initial evaluation findings, risk stratification, comorbidities and participant's personal goals.       Expected Outcomes Short Term: Attend rehab on a regular basis to increase amount of physical activity.;Long Term: Exercising regularly at least 3-5 days a week.;Long Term: Add in home exercise to make exercise part of routine and to increase amount of physical activity.       Increase Strength and Stamina Yes       Intervention Provide advice, education, support and counseling about physical activity/exercise needs.;Develop an individualized exercise prescription for aerobic and resistive training based on initial evaluation findings, risk stratification, comorbidities and participant's personal goals.       Expected Outcomes Short Term: Increase workloads from initial exercise prescription for resistance, speed, and METs.;Short Term: Perform resistance training exercises routinely during rehab and add in resistance training at home;Long Term: Improve cardiorespiratory fitness, muscular endurance and strength as measured by increased METs and functional capacity ( )       Able to understand and use rate of perceived exertion (RPE) scale Yes       Intervention Provide education and explanation on how to use RPE scale       Expected Outcomes Short Term: Able to use RPE daily in rehab to express subjective intensity level;Long Term:  Able to use RPE to guide intensity level when exercising independently       Able to understand and use Dyspnea scale Yes       Intervention Provide education and explanation on how to use Dyspnea scale       Expected Outcomes Short Term: Able to use Dyspnea scale daily in rehab to express subjective sense of shortness of breath during exertion;Long Term: Able to use Dyspnea scale to guide intensity level when exercising independently       Able to check pulse independently  Yes       Intervention Provide education and demonstration on how to check pulse in carotid and radial arteries.;Review the importance of being able to check your own pulse for safety during independent exercise       Expected Outcomes Short Term: Able to explain why pulse checking is important during independent exercise;Long Term: Able to check pulse independently and accurately       Understanding of Exercise Prescription Yes       Intervention Provide education, explanation, and written materials on patient's individual exercise prescription  Expected Outcomes Short Term: Able to explain program exercise prescription;Long Term: Able to explain home exercise prescription to exercise independently                Exercise Goals Re-Evaluation:  Exercise Goals Re-Evaluation     Row Name 11/11/23 1147 12/16/23 0900 01/08/24 1057 01/31/24 1431       Exercise Goal Re-Evaluation   Exercise Goals Review Increase Physical Activity;Able to understand and use Dyspnea scale;Understanding of Exercise Prescription;Increase Strength and Stamina;Knowledge and understanding of Target Heart Rate Range (THRR);Able to understand and use rate of perceived exertion (RPE) scale Increase Physical Activity;Able to understand and use Dyspnea scale;Understanding of Exercise Prescription;Increase Strength and Stamina;Knowledge and understanding of Target Heart Rate Range (THRR);Able to understand and use rate of perceived exertion (RPE) scale Increase Physical Activity;Able to understand and use Dyspnea scale;Understanding of Exercise Prescription;Increase Strength and Stamina;Knowledge and understanding of Target Heart Rate Range (THRR);Able to understand and use rate of perceived exertion (RPE) scale Increase Physical Activity;Able to understand and use Dyspnea scale;Understanding of Exercise Prescription;Increase Strength and Stamina;Knowledge and understanding of Target Heart Rate Range (THRR);Able to understand  and use rate of perceived exertion (RPE) scale    Comments Loetta Ringer is scheduled to start Pulmonary Rehab on 2/11. Will continue to monitor and progress as able. Loetta Ringer has completed10 exercise sessions. He exercises for 15 min on the Nustep and treadmill. Kelly averages 4.4 METs at level 4 on the Nustep and 3.5 at 3.0 mph and 1.5% incline on the treadmill. Loetta Ringer has increased his level and speed and incline on the treadmill. His METs have also increased. Loetta Ringer seems motivated to exercise and improve his functional capacity. Will continue to monitor and progress as able. Loetta Ringer has completed 115 exercise sessions. He exercises for 15 min on the Nustep and treadmill. He performs the warmup and cooldown standing without limitations. Kelly averages 4.4 METs at level 5 on the Nustep and 3.5 at 3.0 mph and 1.5% incline on the treadmill. Loetta Ringer has increased his level on the Nustep. METs have remained relatively the same. Loetta Ringer seems very motivated to exercise and improve his functional capacity. He possibly reaching his max functional capacity. Will continue to monitor and progress as able. Loetta Ringer has completed 18 exercise sessions. His peak METs were 4.4 on the Nustep and 3.5 on the treadmill. Kelly plans to continue exercise at home.    Expected Outcomes Through exercise at rehab and home, the patient will decrease shortness of breath and feel confident in carrying out an exercise regimen at home. Through exercise at rehab and home, the patient will decrease shortness of breath and feel confident in carrying out an exercise regimen at home. Through exercise at rehab and home, the patient will decrease shortness of breath and feel confident in carrying out an exercise regimen at home. Through exercise at rehab and home, the patient will decrease shortness of breath and feel confident in carrying out an exercise regimen at home.             Nutrition & Weight - Outcomes:  Pre Biometrics - 11/06/23 1149       Pre  Biometrics   Grip Strength 36 kg              Nutrition:  Nutrition Therapy & Goals - 01/07/24 1135       Nutrition Therapy   Diet Heart Healthy Diet    Drug/Food Interactions Statins/Certain Fruits      Personal Nutrition Goals   Nutrition  Goal Patient to improve diet quality by using the plate method as a guide for meal planning to include lean protein/plant protein, fruits, vegetables, whole grains, nonfat dairy as part of a well-balanced diet.   goal in progress   Personal Goal #2 Patient to identify food sources and limit daily intake of saturated fat, trans fat, sodium, and refined carbohydrates.   goal in progress.   Personal Goal #3 Patient to reduce sodium intake to 1500mg  per day   goal in progress.   Comments Goals in action. Loetta Ringer was previously enrolled in intensive cardiac rehab in June of 2024 but was unable to finish the program due to gallbladder surgery. He has started making many dietary changes including reduced mountain dew from 5/day to 3/day, drastically reduced fried foods and decreased portions of meat, reduced snacking on refined carbohydrates (chips) and switched to fruit, and increased non-starchy vegetables at dinner daily. Loetta Ringer remains motivated to continue to improve food choices. He quit smoking in January 2025. He is up 2.6# since starting pulmonary rehab. He started repatha  for improved lipid control.  [facility]. Loetta Ringer will benefit from participation in pulmonary rehab for nutrition, exercise, and lifestyle modification.      Intervention Plan   Intervention Prescribe, educate and counsel regarding individualized specific dietary modifications aiming towards targeted core components such as weight, hypertension, lipid management, diabetes, heart failure and other comorbidities.;Nutrition handout(s) given to patient.    Expected Outcomes Short Term Goal: Understand basic principles of dietary content,  such as calories, fat, sodium, cholesterol and nutrients.;Long Term Goal: Adherence to prescribed nutrition plan.             Nutrition Discharge:   Education Questionnaire Score:  Knowledge Questionnaire Score - 01/28/24 1533       Knowledge Questionnaire Score   Post Score 17/18            Loetta Ringer graduated the [employer] program on 01/30/24 completing 19 sessions. At time of graduation he continued to deny any psychosocial barriers or concerns. PHQ9 decreased from a 4 to 3. Loetta Ringer denied any additional resources or referrals. He is proud of himself that he has continued to quit smoking throughout the program.   Graduation review of patient's Core Components/Risk Factors/Patient Goals are as follows: Goal met for improving shortness of breath with ADL's. Kelly exercised on room air to keep sats >88%. He exercised on the Nustep and the treadmill. He maintained his METs and workload on the NuStep and his speed and incline on the treadmill throughout the program. Though dyspnea and CAT scores increased, Loetta Ringer reported less shortness of breath at home and with exercise at graduation.   Goals reviewed with patient; copy given to patient.

## 2024-02-05 ENCOUNTER — Telehealth: Payer: Self-pay | Admitting: Physician Assistant

## 2024-02-05 DIAGNOSIS — E782 Mixed hyperlipidemia: Secondary | ICD-10-CM

## 2024-02-05 DIAGNOSIS — I2510 Atherosclerotic heart disease of native coronary artery without angina pectoris: Secondary | ICD-10-CM

## 2024-02-05 DIAGNOSIS — I255 Ischemic cardiomyopathy: Secondary | ICD-10-CM

## 2024-02-05 MED ORDER — ATORVASTATIN CALCIUM 80 MG PO TABS: 80.0000 mg | ORAL_TABLET | Freq: Every day | ORAL | 2 refills | Status: AC

## 2024-02-05 NOTE — Telephone Encounter (Signed)
 Pt's medication was sent to pt's pharmacy as requested. Confirmation received.

## 2024-02-05 NOTE — Telephone Encounter (Signed)
*  STAT* If patient is at the pharmacy, call can be transferred to refill team.   1. Which medications need to be refilled? (please list name of each medication and dose if known)  atorvastatin  (LIPITOR) 80 MG tablet  2. Which pharmacy/location (including street and city if local pharmacy) is medication to be sent to? [id number] - [place], [place] - [address]   3. Do they need a 30 day or 90 day supply? 90 Took last one today

## 2024-02-06 DIAGNOSIS — Z6827 Body mass index (BMI) 27.0-27.9, adult: Secondary | ICD-10-CM | POA: Diagnosis not present

## 2024-02-06 DIAGNOSIS — J40 Bronchitis, not specified as acute or chronic: Secondary | ICD-10-CM | POA: Diagnosis not present

## 2024-02-18 ENCOUNTER — Encounter: Payer: Self-pay | Admitting: Pharmacist Clinician (PhC)/ Clinical Pharmacy Specialist

## 2024-02-18 DIAGNOSIS — E782 Mixed hyperlipidemia: Secondary | ICD-10-CM

## 2024-04-06 ENCOUNTER — Other Ambulatory Visit: Payer: Self-pay

## 2024-04-06 DIAGNOSIS — I255 Ischemic cardiomyopathy: Secondary | ICD-10-CM

## 2024-04-06 DIAGNOSIS — I2510 Atherosclerotic heart disease of native coronary artery without angina pectoris: Secondary | ICD-10-CM

## 2024-04-06 DIAGNOSIS — E782 Mixed hyperlipidemia: Secondary | ICD-10-CM

## 2024-04-06 MED ORDER — CLOPIDOGREL BISULFATE 75 MG PO TABS: 75.0000 mg | ORAL_TABLET | Freq: Every day | ORAL | 1 refills | Status: DC

## 2024-04-27 ENCOUNTER — Ambulatory Visit: Attending: Internal Medicine | Admitting: Internal Medicine

## 2024-04-27 VITALS — BP 118/76 | HR 67 | Ht 72.0 in | Wt 207.0 lb

## 2024-04-27 DIAGNOSIS — I2510 Atherosclerotic heart disease of native coronary artery without angina pectoris: Secondary | ICD-10-CM | POA: Diagnosis not present

## 2024-04-27 DIAGNOSIS — E8881 Metabolic syndrome: Secondary | ICD-10-CM | POA: Diagnosis not present

## 2024-04-27 DIAGNOSIS — Z9861 Coronary angioplasty status: Secondary | ICD-10-CM

## 2024-04-27 DIAGNOSIS — I5022 Chronic systolic (congestive) heart failure: Secondary | ICD-10-CM | POA: Diagnosis not present

## 2024-04-27 DIAGNOSIS — E785 Hyperlipidemia, unspecified: Secondary | ICD-10-CM | POA: Diagnosis not present

## 2024-04-27 DIAGNOSIS — I10 Essential (primary) hypertension: Secondary | ICD-10-CM

## 2024-04-27 DIAGNOSIS — I255 Ischemic cardiomyopathy: Secondary | ICD-10-CM

## 2024-04-27 NOTE — Progress Notes (Signed)
 [facility]    OFFICE NOTE  Chief Complaint:  Follow-up heart failure  Primary Care Physician: Nanci Senior, MD  HPI:  Alan White is a 63 y.o. male with no history of CAD. CRFs are tobacco, FH. No known history of HTN, HL, DM. He presented 12/28/13 with a 4 day history of intermittent chest pressure. It was left substernal, radiating to both arms. It was 8/10 at its worst. It had occurred at rest and with exertion. He tried OTC PPI and Tums, without relief. One time, he took ASA which helped. It seems to happen more often in the afternoon/evening. It has also woken him from sleep. He had SOB with it it if occurred with exertion, but also describes DOE. He has had no N&V or diaphoresis. He told his wife about it 3 days ago, has had multiple episodes. He took a day off from work and came to the ER because it was scaring him. He was admitted by Dr. Mona with addition of IV heparin  and NTG. Cardiac enzymes were negative- exceptfor 0.11 of troponin marker. Pt underwent cardiac cath with findings of:   1. Severe proximal to mid LAD and mid circumflex stenoses as outlined above.  2. Normal right coronary artery.  3. Wall motion abnormality involving the inferoapical wall. Preserved LVEF at 50-55%  4. Successful circumflex DES implantation post dilated to 3.75 mm in diameter and an aneurysmal portion of the vessel. 0% stenosis was noted post deployment.  5. Successful deployment of overlapping stents in the proximal to mid LAD from 99% to 0% with TIMI grade 3 flow. Postdilatation diameter was 3.0   Dual antiplatelet therapy for greater than 12 months. Brilinta  can be switched to Plavix  at 6 months.   Pt did well post procedure. EKG with incomplete RBBB, t wave inversions in ant lat leads. Was seen by Dr. Swaziland and found stable for discharge. Will ambulate with cardiac rehab first. Have called [facility] to arrange new pt appointment, they are to call pt.   Other issues borderline diabetes will  have dietician see before discharge. Pt on statin, BB and asa, Brilinta . BP is borderline, ACE will be added as outpatient. No work until  01/04/14.  Alan White had been reporting very similar left chest discomfort that he had prior to his original stents. He reported a soreness under the left breast which improved with elevating the left breast. This is very atypical however seem to improve significantly after having his stents placed. He said that he felt great for about 3 months after having stents placed but is since become more short of breath and continues to have chest discomfort. He presented the hospital with similar complaints in early October and underwent a repeat cardiac catheterization which showed widely patent stents. He was discharged without changes in his medication. He comes back today with again similar complaints in the left chest. Unclear whether this is angina or not. He may have had some improvement with a nitrate. Again he is describing some shortness of breath.  Alan White returns today and reports she is feeling the best he has in years. He took his isosorbide  for about a week and then had awful headaches and discontinued it. He did notice a change during that week and improvement in his chest pain symptoms, however. He also was switched from Brilinta  to Plavix  and seems to be tolerating this well. His P2 Y 12 assay indicated 221,which is adequate platelet suppression.  I saw Alan White back in  the office today. Overall he tends to feel fatigued at times. He said initially it felt the best that he had in years after his PCI but now seems to tired fairly easily. He was ordered for sleep study however that has not yet been performed. He also has some significant anxiety and continues to smoke. He's been very difficult for him to quit smoking. I do believe anxiety is playing a role in this. He denies anymore cardiac chest pain.  Alan White returns today for follow-up. Overall he is  feeling excellent today. He denies any chest pain or shortness of breath. He's been dealing with a kidney stone but he seeing a urologist for this. He has significantly cut back his smoking down to 5 cigarettes a day. He is not started Zyban , but he has filled the prescription and said that he would start the medicine if he cannot stop smoking on his own.  05/29/2018  Alan White is seen today in follow-up.  He denies any chest pain or worsening shortness of breath.  He recently saw Scot Ford, PA-C in February 2019.  He is complaining of some leg pain and underwent venous Dopplers which were negative for DVT.  He had some associated edema.  That is resolved.  He denies any worsening chest pain.  He has not had any lab work in the past couple of years.  08/20/2019  Alan White is seen today in follow-up.  I saw him recently for virtual visit.  He had interim decline in LVEF down to 25 to 30% with severe akinesis of the mid apical anteroseptal wall lateral wall and apical septum.  It was felt that there might be some nonischemic and ischemic mixed cardiomyopathy.  I did switch him over to Entresto  and he is on spironolactone .  Unfortunately he had hypotension with this and was feeling very fatigued.  His spironolactone  was then decreased to 12.5 mg every other day.  After doing this his blood pressure has come up now into the low 100s and he feels much better.  He is currently not working and is out until February on short-term disability.  10/26/2019  Alan White returns today for follow-up.  He underwent a repeat echo which unfortunately shows no significant improvement in LV function with EF 25 to 30%.  He is ready been referred to and seen by Dr. Inocencio in early January for evaluation of AICD.  He agrees that we should proceed with this due to increased risk of sudden cardiac death.  Alan White has started doing some more exercise.  He says he does almost on a daily basis with either walking on a treadmill  or riding a stationary bicycle between 15 and 30 minutes.  Overall he says he feels now the best that he has in a while.  Recently was started on a nitro patch which he says is helped significantly as well therefore he may have either had some persistent angina or heart failure symptoms that were improved with this.  He is currently on maximal tolerated goal-directed therapy for congestive heart failure, however due to hypotension with blood pressures typically in the 90s to 100 systolic, I am unable to uptitrate his medicine further.  04/12/2020  Alan White returns today for follow-up.  Overall he says he feels some fatigue.  He is glad however that he went through with the AICD placement.  That was back in March with Dr. Inocencio.  He had no issues with it since then has  done well.  Remote checks have been normal.  Unfortunately his LVEF has not improved significantly.  Blood pressure was unusually high for him today as it typically runs around 100 systolic.  This been little to no room to uptitrate his medications for heart failure.  He reports getting fatigue and seems like he is quite busy.  He works still about 8 to 10 hours a day.  He is very fatigued after this and is being pressured somewhat by his family to consider retiring.  Given his significant cardiomyopathy, he would very likely qualify for disability however he has wanted to work.  Unfortunately since his symptoms are not improving and is likely going to be a long-term issue, I would support him as I feel he would qualify for long-term disability.   11/23/2020  Alan White returns today for follow-up of his heart failure.  Recently was randomized into the bat wire trial which is a Baro stem device similar to a pacemaker that stimulates the carotid sinus.  He says he already feels somewhat better after this implantation.  Of course he has an ICD as well.  LVEF has been consistently around 30 to 35% however he notes some improvement in quality of  life.  They have been slowly increasing the output of the device and he will have a repeat echo in May of this year.  Unfortunately, blood pressure would not allow further up titration of his medications  08/05/2023  Alan White returns today for follow-up.  Due to some insurance issues he was getting care at the [facility] but has reestablished with me.  He had undergone placement of the barrow stem device with marked improvement in LVEF up to 40 to 45%.  Recently he has been having symptoms of reflux.  He was switched to famotidine but notes that that causes him some side effects.  He had previously done better on pantoprazole  and was and is requesting that today.  Blood pressure was low today at 96/62.  He has been on Entresto .  He was seen recently by Delon Holts, PA-C who recommended switching his carvedilol  to low-dose Toprol  XL 12.5 mg daily.  He says that change actually improved his symptoms quite a bit.  When he was at the [facility] they advised cutting his Entresto  high in half and taking 1/2 tablet twice a day.  As this is a combination medication, and is not advisable to cut the medication in half and that is not a supportive dose as noted in the package insert.  04/27/2024  Alan White is seen today in follow-up.  Overall he says he is feeling pretty well.  Unfortunately had recent abdominal pain in June 2024 and ultimately had cholecystectomy.  He has done well since then.  He does have a batwire device (Barostim) which has been very successful at controlling his blood pressure.  In fact we have had to back off on a lot of his medications.  Some of those were for heart failure.  His last echo in 2022 showed LVEF 40 to 45%.  Overall he endorses NYHA class I symptoms.  He denies chest pain.  He has been on dual antiplatelet therapy but last had PCI in 2015.  He also has an AICD implant.  This is followed by cardiac electrophysiology.  He has follow-up in September.  PMHx:  Past Medical History:  Diagnosis  Date   AICD (automatic cardioverter/defibrillator) present    [facility]) [employer] ICD   CAD (coronary artery  disease), 12/29/13 PCI/DES LCX and PCI/DES LAD with overlapping DES  12/30/2013   cath 07/05/14 [place]   CHF (congestive heart failure) (HCC)    Dyslipidemia, goal LDL below 70 12/30/2013   GERD (gastroesophageal reflux disease)    History of kidney stones    Metabolic syndrome, HgbA1C 6.0  12/30/2013   Myocardial infarction [facility]) 2015,2020   Smoker    Wears partial dentures    upper    Past Surgical History:  Procedure Laterality Date   BAROREFLEX SYSTEM INSERTION Right 08/02/2020   Procedure: BAROREFLEX SYSTEM INSERTION;  Surgeon: Kelsie Agent, MD;  Location: [facility] INVASIVE CV LAB;  Service: Cardiovascular;  Laterality: Right;   CARDIAC CATHETERIZATION  07/05/14,01/31/19   patent stents   CHOLECYSTECTOMY N/A 06/14/2023   Procedure: LAPAROSCOPIC CHOLECYSTECTOMY;  Surgeon: Kinsinger, Herlene Righter, MD;  Location: [facility] OR;  Service: General;  Laterality: N/A;   CORONARY ANGIOPLASTY WITH STENT PLACEMENT  12/29/13   DES to LCX and overlapping stents DES mid LAD   CORONARY/GRAFT ACUTE MI REVASCULARIZATION N/A 01/31/2019   Procedure: Coronary/Graft Acute MI Revascularization;  Surgeon: Verlin Lonni BIRCH, MD;  Location: [facility] INVASIVE CV LAB;  Service: Cardiovascular;  Laterality: N/A;   ICD IMPLANT N/A 12/17/2019   Procedure: ICD IMPLANT;  Surgeon: Inocencio Soyla Lunger, MD;  Location: [facility] INVASIVE CV LAB;  Service: Cardiovascular;  Laterality: N/A;   LEFT HEART CATH AND CORONARY ANGIOGRAPHY N/A 01/31/2019   Procedure: LEFT HEART CATH AND CORONARY ANGIOGRAPHY;  Surgeon: Verlin Lonni BIRCH, MD;  Location: [facility] INVASIVE CV LAB;  Service: Cardiovascular;  Laterality: N/A;   LEFT HEART CATHETERIZATION WITH CORONARY ANGIOGRAM N/A 12/29/2013   Procedure: LEFT HEART CATHETERIZATION WITH CORONARY ANGIOGRAM;  Surgeon: Victory LELON Claudene DOUGLAS, MD;  Location: [facility] CATH LAB;  Service: Cardiovascular;  Laterality:  N/A;   LEFT HEART CATHETERIZATION WITH CORONARY ANGIOGRAM N/A 07/05/2014   Procedure: LEFT HEART CATHETERIZATION WITH CORONARY ANGIOGRAM;  Surgeon: Peter M Swaziland, MD;  Location[facility] C[facility] LAB;  Service: Cardiovascular;  Laterality: N/A;   PERCUTANEOUS CORONARY STENT INTERVENTION (PCI-S)  12/29/2013   Procedure: PERCUTANEOUS CORONARY STENT INTERVENTION (PCI-S);  Surgeon: Victory LELON Claudene DOUGLAS, MD;  Location: [facility] C[facility] LAB;  Service: Cardiovascular;;    FAMHx:  Family History  Problem Relation Age of Onset   Heart attack Brother        Deceased   Heart attack Brother    Stroke Sister    Diabetes Father        also heart disease   Cancer Father        Deceased   Diabetes Mother    Cancer Mother        Deceased    SOCHx:   reports that he quit smoking about 6 months ago. His smoking use included cigarettes. He has a 25 pack-year smoking history. He has never used smokeless tobacco. He reports current alcohol use. He reports that he does not use drugs.  ALLERGIES:  Allergies  Allergen Reactions   Zetia [Ezetimibe] Shortness Of Breath    Throat swelling   Levaquin [Levofloxacin] Other (See Comments)    Muscle cramps    ROS: A comprehensive review of systems was negative.  HOME MEDS: Current Outpatient Medications  Medication Sig Dispense Refill   aspirin  EC 81 MG tablet Take 1 tablet (81 mg total) by mouth daily. 30 tablet 11   atorvastatin  (LIPITOR) 80 MG tablet Take 1 tablet (80 mg total) by mouth daily. 90 tablet 2   metoprolol  succinate (TOPROL  XL) 25 MG  24 hr tablet Take 0.5 tablets (12.5 mg total) by mouth daily. 90 tablet 3   nitroGLYCERIN  (NITROSTAT ) 0.4 MG SL tablet Place 1 tablet (0.4 mg total) under the tongue every 5 (five) minutes x 3 doses as needed for chest pain. 25 tablet 12   pantoprazole  (PROTONIX ) 40 MG tablet Take 1 tablet (40 mg total) by mouth daily. 90 tablet 3   sacubitril -valsartan  (ENTRESTO ) 24-26 MG Take 1 tablet by mouth 2 (two) times daily. 180 tablet 3    No current facility-administered medications for this visit.    LABS/IMAGING: No results found for this or any previous visit (from the past 48 hours). No results found.  VITALS: BP 118/76 (BP Location: Right Arm, Patient Position: Sitting, Cuff Size: Normal)   Pulse 67   Ht 6' (1.829 m)   Wt 207 lb (93.9 kg)   SpO2 95%   BMI 28.07 kg/m   EXAM: General appearance: alert and no distress Neck: no carotid bruit, no JVD and thyroid not enlarged, symmetric, no tenderness/mass/nodules Lungs: clear to auscultation bilaterally Heart: regular rate and rhythm, S1, S2 normal, no murmur, click, rub or gallop Abdomen: soft, non-tender; bowel sounds normal; no masses,  no organomegaly Extremities: extremities normal, atraumatic, no cyanosis or edema Pulses: 2+ and symmetric Skin: Skin color, texture, turgor normal. No rashes or lesions Neurologic: Grossly normal Psych: Pleasant  EKG: EKG Interpretation Date/Time:  Monday April 27 2024 09:42:56 EDT Ventricular Rate:  69 PR Interval:  230 QRS Duration:  90 QT Interval:  396 QTC Calculation: 424 R Axis:   13  Text Interpretation: Sinus rhythm with 1st degree A-V block Cannot rule out Inferior infarct , age undetermined Anteroseptal infarct (cited on or before 05-Aug-2023) When compared with ECG of 05-Aug-2023 09:34, Minimal criteria for Inferior infarct are now Present Confirmed by Mona Kent [phone]) on 04/27/2024 9:49:23 AM   ASSESSMENT: Coronary artery disease status post two-vessel PCI to the left circumflex and LAD (2015) Dyslipidemia on statin Tobacco abuse - working on quitting Anxiety Mixed ischemic and nonischemic cardiomyopathy EF 25 to 30%, improved to 40 to 45% (2022), NYHA class I symptoms Status post AICD [facility], [facility]) - 11/2019 status post Batwire (Barostim device)  PLAN: 1.   Mr. Plemmons seems to be doing well without any significant heart failure symptoms.  His last echo was in 2022 and I would like to  update that today.  I do not see ongoing indication for dual antiplatelet therapy and he reports easy bruising and bleeding.  Will stop clopidogrel .  In addition I do not think he would need isosorbide  at this point so I will stop that.  Will continue aspirin , atorvastatin  and Entresto .  Unfortunately he did not tolerate Repatha  which caused significant side effects.  His last LDL was 71.  He is also on Toprol -XL.  He is not on an SGLT2 inhibitor.  I suspect that we will add this.  I would like to get the echo first.  He did seem agreeable to starting Jardiance.  Follow-up with me annually or sooner as necessary.  Kent KYM Mona, MD, [facility], FNLA, FACP    [facility]  Medical Director of the Advanced Lipid Disorders &  Cardiovascular Risk Reduction Clinic Diplomate of the American Board of Clinical Lipidology Attending Cardiologist  Direct Dial: [phone]  Fax: [phone]  Website:  [email or web address] Kent BROCKS Jacody Beneke 04/27/2024, 10:44 AM

## 2024-04-27 NOTE — Patient Instructions (Signed)
 Medication Instructions:  STOP Clopidogrel  (Plavix )  STOP Isosorbide  mononitrate (Imdure)  *If you need a refill on your cardiac medications before your next appointment, please call your pharmacy*  Lab Work: FASTING lipids- NMR today LPa, CMET, CBC and HbA1c today  If you have labs (blood work) drawn today and your tests are completely normal, you will receive your results only by: MyChart Message (if you have MyChart) OR A paper copy in the mail If you have any lab test that is abnormal or we need to change your treatment, we will call you to review the results.  Testing/Procedures: Your physician has requested that you have an echocardiogram. Echocardiography is a painless test that uses sound waves to create images of your heart. It provides your doctor with information about the size and shape of your heart and how well your heart's chambers and valves are working. This procedure takes approximately one hour. There are no restrictions for this procedure. Please do NOT wear cologne, perfume, aftershave, or lotions (deodorant is allowed). Please arrive 15 minutes prior to your appointment time.  Follow-Up: At [facility], you and your health needs are our priority.  As part of our continuing mission to provide you with exceptional heart care, our providers are all part of one team.  This team includes your primary Cardiologist (physician) and Advanced Practice Providers or APPs (Physician Assistants and Nurse Practitioners) who all work together to provide you with the care you need, when you need it.  Your next appointment:   1 year with Dr. Mona

## 2024-04-28 ENCOUNTER — Ambulatory Visit: Payer: Self-pay | Admitting: Internal Medicine

## 2024-04-28 DIAGNOSIS — Z79899 Other long term (current) drug therapy: Secondary | ICD-10-CM

## 2024-04-28 LAB — HEMOGLOBIN A1C
Est. average glucose Bld gHb Est-mCnc: 123 mg/dL
Hgb A1c MFr Bld: 5.9 — AB (ref 4.8–5.6)

## 2024-04-28 LAB — COMPREHENSIVE METABOLIC PANEL WITH GFR
ALT: 13 IU/L (ref 0–44)
AST: 16 IU/L (ref 0–40)
Albumin: 4 g/dL (ref 3.9–4.9)
Alkaline Phosphatase: 146 IU/L — AB (ref 44–121)
BUN/Creatinine Ratio: 11 (ref 10–24)
BUN: 13 mg/dL (ref 8–27)
Bilirubin Total: 0.3 mg/dL (ref 0.0–1.2)
CO2: 24 mmol/L (ref 20–29)
Calcium: 9.3 mg/dL (ref 8.6–10.2)
Chloride: 103 mmol/L (ref 96–106)
Creatinine, Ser: 1.22 mg/dL (ref 0.76–1.27)
Globulin, Total: 2.2 g/dL (ref 1.5–4.5)
Glucose: 83 mg/dL (ref 70–99)
Potassium: 4.5 mmol/L (ref 3.5–5.2)
Sodium: 143 mmol/L (ref 134–144)
Total Protein: 6.2 g/dL (ref 6.0–8.5)
eGFR: 66 mL/min/1.73 (ref >59)

## 2024-04-28 LAB — NMR, LIPOPROFILE
Cholesterol, Total: 117 mg/dL (ref 100–199)
HDL Particle Number: 25.1 umol/L — AB (ref >=30.5)
HDL-C: 35 mg/dL — AB (ref >39)
LDL Particle Number: 724 nmol/L (ref <1000)
LDL Size: 20.6 nm (ref >20.5)
LDL-C (NIH Calc): 64 mg/dL (ref 0–99)
LP-IR Score: 50 — AB (ref <=45)
Small LDL Particle Number: 275 nmol/L (ref <=527)
Triglycerides: 96 mg/dL (ref 0–149)

## 2024-04-28 LAB — CBC
Hematocrit: 42 % (ref 37.5–51.0)
Hemoglobin: 13.7 g/dL (ref 13.0–17.7)
MCH: 29.8 pg (ref 26.6–33.0)
MCHC: 32.6 g/dL (ref 31.5–35.7)
MCV: 92 fL (ref 79–97)
Platelets: 212 x10E3/uL (ref 150–450)
RBC: 4.59 x10E6/uL (ref 4.14–5.80)
RDW: 13 % (ref 11.6–15.4)
WBC: 8 x10E3/uL (ref 3.4–10.8)

## 2024-04-28 LAB — LIPOPROTEIN A (LPA): Lipoprotein (a): 107.4 nmol/L — AB (ref <75.0)

## 2024-05-07 DIAGNOSIS — C44519 Basal cell carcinoma of skin of other part of trunk: Secondary | ICD-10-CM | POA: Diagnosis not present

## 2024-05-07 DIAGNOSIS — Z85828 Personal history of other malignant neoplasm of skin: Secondary | ICD-10-CM | POA: Diagnosis not present

## 2024-05-07 DIAGNOSIS — C44629 Squamous cell carcinoma of skin of left upper limb, including shoulder: Secondary | ICD-10-CM | POA: Diagnosis not present

## 2024-05-07 DIAGNOSIS — L821 Other seborrheic keratosis: Secondary | ICD-10-CM | POA: Diagnosis not present

## 2024-06-04 ENCOUNTER — Ambulatory Visit (HOSPITAL_COMMUNITY)
Admission: RE | Admit: 2024-06-04 | Discharge: 2024-06-04 | Disposition: A | Discharge status: Home / Self Care | Source: Ambulatory Visit | Attending: Cardiology | Admitting: Cardiology

## 2024-06-04 DIAGNOSIS — I255 Ischemic cardiomyopathy: Secondary | ICD-10-CM | POA: Insufficient documentation

## 2024-06-04 LAB — ECHOCARDIOGRAM COMPLETE
Area-P 1/2: 3.43 cm2
S' Lateral: 4 cm

## 2024-06-10 ENCOUNTER — Other Ambulatory Visit (HOSPITAL_COMMUNITY): Payer: Self-pay

## 2024-06-10 ENCOUNTER — Telehealth: Payer: Self-pay | Admitting: Pharmacy Technician

## 2024-06-10 MED ORDER — EMPAGLIFLOZIN 10 MG PO TABS: 10.0000 mg | ORAL_TABLET | Freq: Every day | ORAL | 11 refills | Status: AC

## 2024-06-10 NOTE — Telephone Encounter (Addendum)
 Per staff message  Patient Advocate Encounter   The patient was approved for a [employer] grant that will help cover the cost of jardiance  Total amount awarded, [phone].  Effective: 05/11/24 - 05/10/25   APW:[id number] ERW:[id number] Hmnle:[id number] PI:[id number] [employer] ID: [id number]   Pharmacy provided with approval and processing information. Patient informed via mychart/telephone   I called and spoke to [facility] and they said this was already ready for free under just his insurance. I spoke to patient and made him aware. He said he normally doesn't have a copay on his medications but was nervous he would have one on this and that's why they were asking. He said all of his medications are free but aspirin . I told him for the future, lets wait to see what his insurance will pay and then we can provide assistance.

## 2024-06-16 DIAGNOSIS — C44629 Squamous cell carcinoma of skin of left upper limb, including shoulder: Secondary | ICD-10-CM | POA: Diagnosis not present

## 2024-06-16 DIAGNOSIS — L821 Other seborrheic keratosis: Secondary | ICD-10-CM | POA: Diagnosis not present

## 2024-06-16 DIAGNOSIS — C44519 Basal cell carcinoma of skin of other part of trunk: Secondary | ICD-10-CM | POA: Diagnosis not present

## 2024-06-16 DIAGNOSIS — D485 Neoplasm of uncertain behavior of skin: Secondary | ICD-10-CM | POA: Diagnosis not present

## 2024-06-16 DIAGNOSIS — C44712 Basal cell carcinoma of skin of right lower limb, including hip: Secondary | ICD-10-CM | POA: Diagnosis not present

## 2024-06-16 DIAGNOSIS — L570 Actinic keratosis: Secondary | ICD-10-CM | POA: Diagnosis not present

## 2024-06-16 DIAGNOSIS — D171 Benign lipomatous neoplasm of skin and subcutaneous tissue of trunk: Secondary | ICD-10-CM | POA: Diagnosis not present

## 2024-06-16 DIAGNOSIS — Z85828 Personal history of other malignant neoplasm of skin: Secondary | ICD-10-CM | POA: Diagnosis not present

## 2024-06-16 DIAGNOSIS — L820 Inflamed seborrheic keratosis: Secondary | ICD-10-CM | POA: Diagnosis not present

## 2024-06-16 DIAGNOSIS — D2261 Melanocytic nevi of right upper limb, including shoulder: Secondary | ICD-10-CM | POA: Diagnosis not present

## 2024-06-29 ENCOUNTER — Ambulatory Visit: Attending: Student | Admitting: Student

## 2024-06-29 ENCOUNTER — Encounter: Payer: Self-pay | Admitting: Student

## 2024-06-29 VITALS — BP 94/58 | HR 74 | Ht 72.0 in | Wt 202.0 lb

## 2024-06-29 DIAGNOSIS — Z79899 Other long term (current) drug therapy: Secondary | ICD-10-CM | POA: Diagnosis not present

## 2024-06-29 DIAGNOSIS — I255 Ischemic cardiomyopathy: Secondary | ICD-10-CM

## 2024-06-29 DIAGNOSIS — I2510 Atherosclerotic heart disease of native coronary artery without angina pectoris: Secondary | ICD-10-CM | POA: Diagnosis not present

## 2024-06-29 DIAGNOSIS — I5022 Chronic systolic (congestive) heart failure: Secondary | ICD-10-CM

## 2024-06-29 NOTE — Patient Instructions (Signed)
 Medication Instructions:  Your physician recommends that you continue on your current medications as directed. Please refer to the Current Medication list given to you today.  *If you need a refill on your cardiac medications before your next appointment, please call your pharmacy*  Lab Work: None ordered If you have labs (blood work) drawn today and your tests are completely normal, you will receive your results only by: MyChart Message (if you have MyChart) OR A paper copy in the mail If you have any lab test that is abnormal or we need to change your treatment, we will call you to review the results.  Follow-Up: At [facility], you and your health needs are our priority.  As part of our continuing mission to provide you with exceptional heart care, our providers are all part of one team.  This team includes your primary Cardiologist (physician) and Advanced Practice Providers or APPs (Physician Assistants and Nurse Practitioners) who all work together to provide you with the care you need, when you need it.  Your next appointment:   3-4 month(s)  Provider:   Ozell Jodie Passey, PA-C

## 2024-06-29 NOTE — Progress Notes (Signed)
  Cardiology Office Note:   Date:  06/29/2024  ID:  Alan White, DOB 07-21-1961, MRN [id number]  Primary Cardiologist: Vinie JAYSON Maxcy, MD Electrophysiologist: Soyla Gladis Norton, MD   History of Present Illness:   Alan White is a 63 y.o. male with h/o CAD, CHF, HLD, and OSA seen today for routine electrophysiology followup.   ICD managed at [facility]. Barostim managed here. Last seen 11/2023.  Since last being seen in our clinic the patient reports doing OK. He was upset to find out his EF had dropped. Started on Farxiga and pending HF clinic to establish.  He has noted more fatigue, but not necessarily more SOB. Otherwise, he denies chest pain, palpitations, dyspnea, PND, orthopnea, nausea, vomiting, dizziness, syncope, edema, weight gain, or early satiety.   [facility] no longer monitoring remotes; Pt thought we had taken back over.   Review of systems complete and found to be negative unless listed in HPI.    EP Information / Studies Reviewed:    EKG is not ordered today. EKG from 04/27/2024 reviewed which showed NSR with first degree AV block at 69 bpm   Arrhythmia/Device History Abbott Dual Chamber ICD 12/17/2019 for CHF BAT [facility]) 08/2020   Barostim Interrogation- Performed personally and reviewed in detail today,  See scanned report  ICD/PPM interrogation - Managed at [facility]   Physical Exam:   VS:  BP (!) 94/58   Pulse 74   Ht 6' (1.829 m)   Wt 202 lb (91.6 kg)   SpO2 96%   BMI 27.40 kg/m    Wt Readings from Last 3 Encounters:  06/29/24 202 lb (91.6 kg)  04/27/24 207 lb (93.9 kg)  01/28/24 208 lb 15.9 oz (94.8 kg)     GEN: Well nourished, well developed in no acute distress NECK: No JVD; No carotid bruits CARDIAC: Regular rate and rhythm, no murmurs, rubs, gallops RESPIRATORY:  Clear to auscultation without rales, wheezing or rhonchi  ABDOMEN: Soft, non-tender, non-distended EXTREMITIES:  No edema; No deformity   ASSESSMENT AND PLAN:    Chronic systolic CHF s/p Abbott  Defibrillator and Barostim implantation NYHA III symptoms.   Device programmed at 6.8 for chronic settings  Device impedence stable. See attached image. ICD with >6 years battery life and no episodes. Not fully checked today given normal check in March.   He has had stim at higher amplitude on his Barostim; Would consider him at max programming. Very unlikely to receive benefit from further attempts at up-titration, nor would he likely tolerate.   Will re-establish remotes.   ADDENDUM  Just after he left [facility] emailed that new guideline directions are to schedule Gen Change 3 months prior to RRT which will be 11/22/2024. Pt can be scheduled for gen change any day after 08/22/2024. [facility] to initiate prior auth, and scheduler notified.   Called pt via phone and he is also aware.   CAD No s/s of ischemia.      Disposition:   Follow up with EP Team in ~3 months  Signed, Ozell Prentice Passey, PA-C

## 2024-06-30 LAB — BASIC METABOLIC PANEL WITH GFR
BUN/Creatinine Ratio: 13 (ref 10–24)
BUN: 16 mg/dL (ref 8–27)
CO2: 24 mmol/L (ref 20–29)
Calcium: 9.7 mg/dL (ref 8.6–10.2)
Chloride: 102 mmol/L (ref 96–106)
Creatinine, Ser: 1.26 mg/dL (ref 0.76–1.27)
Glucose: 79 mg/dL (ref 70–99)
Potassium: 3.8 mmol/L (ref 3.5–5.2)
Sodium: 140 mmol/L (ref 134–144)
eGFR: 64 mL/min/1.73 (ref >59)

## 2024-07-02 ENCOUNTER — Telehealth: Payer: Self-pay

## 2024-07-02 NOTE — Telephone Encounter (Signed)
-----   Message from Ozell Barter Tillery sent at 06/29/2024 10:15 AM EDT ----- Regarding: Barostim Gen Change - Morning, Azeneth Carbonell!  Right after I saw this guy today [place]ockey emailed me that the new recommendation is to try and schedule gen changes for Barostim around the 3 month mark prior to their RRT date.   On his check today estimated battery life is good through 11/22/2024.   So he could be scheduled for gen change anytime after 08/22/2025.      Can we get him on for Barostim gen change and then add an office visit with me to precede it?   I think this is also to try and hedge off the issues with insurance approvals some sites have been having.   Thank you! Jodie

## 2024-07-02 NOTE — Telephone Encounter (Signed)
 LM for pt to call back to schedule Barostim Gen Change with Dr. Inocencio.

## 2024-07-09 ENCOUNTER — Encounter: Payer: Self-pay | Admitting: Student

## 2024-07-13 NOTE — Telephone Encounter (Signed)
 Pt is scheduled on 1/9 at 3:00 pm with Dr. Inocencio for Barostim gen change. He wanted to wait until after the Holidays to have this done. He is scheduled to see Jodie Passey, PA on 12/29 and will get labs, scrub and Instruction letter at that time.

## 2024-07-20 ENCOUNTER — Telehealth: Payer: Self-pay

## 2024-07-20 NOTE — Telephone Encounter (Signed)
-----   Message from [facility] sent at 07/15/2024  8:54 AM EDT ----- Regarding: RE: Aspirin  Should be able to continue his aspirin .  Thanks. ----- Message ----- From: Dreama Earing, CMA Sent: 07/13/2024   4:17 PM EDT To: Soyla Gladis Norton, MD Subject: Aspirin                                         Pt is scheduled for Barostim Gen Change on 1/9.... he takes ASA EC 81 mg daily - how long should he hold it for this procedure?   12/29/13 PCI/DES CFX and LAD- patent 07/05/14 01/31/2019- LAD ISR after he stopped his medications- treated with POBA, CFX DES was patent.    Greer Koeppen

## 2024-07-26 ENCOUNTER — Other Ambulatory Visit: Payer: Self-pay | Admitting: Internal Medicine

## 2024-07-28 DIAGNOSIS — C44629 Squamous cell carcinoma of skin of left upper limb, including shoulder: Secondary | ICD-10-CM | POA: Diagnosis not present

## 2024-07-28 DIAGNOSIS — Z85828 Personal history of other malignant neoplasm of skin: Secondary | ICD-10-CM | POA: Diagnosis not present

## 2024-08-05 ENCOUNTER — Telehealth: Payer: Self-pay | Admitting: Physician Assistant

## 2024-08-05 DIAGNOSIS — E782 Mixed hyperlipidemia: Secondary | ICD-10-CM

## 2024-08-05 DIAGNOSIS — I2510 Atherosclerotic heart disease of native coronary artery without angina pectoris: Secondary | ICD-10-CM

## 2024-08-05 DIAGNOSIS — I255 Ischemic cardiomyopathy: Secondary | ICD-10-CM

## 2024-08-05 MED ORDER — METOPROLOL SUCCINATE ER 25 MG PO TB24: 12.5000 mg | ORAL_TABLET | Freq: Every day | ORAL | 2 refills | Status: AC

## 2024-08-05 NOTE — Telephone Encounter (Signed)
*  STAT* If patient is at the pharmacy, call can be transferred to refill team.   1. Which medications need to be refilled? (please list name of each medication and dose if known) metoprolol  succinate (TOPROL  XL) 25 MG 24 hr tablet    2. Would you like to learn more about the convenience, safety, & potential cost savings by using the [facility]? No    3. Are you open to using the [facility] (Type [facility]. No    4. Which pharmacy/location (including street and city if local pharmacy) is medication to be sent to?[id number] - [place], [place] - [address] [place] [place]    5. Do they need a 30 day or 90 day supply? 90 day

## 2024-08-05 NOTE — Telephone Encounter (Signed)
 [facility]ill [facility]

## 2024-09-08 DIAGNOSIS — L570 Actinic keratosis: Secondary | ICD-10-CM | POA: Diagnosis not present

## 2024-09-08 DIAGNOSIS — Z85828 Personal history of other malignant neoplasm of skin: Secondary | ICD-10-CM | POA: Diagnosis not present

## 2024-09-08 DIAGNOSIS — C44629 Squamous cell carcinoma of skin of left upper limb, including shoulder: Secondary | ICD-10-CM | POA: Diagnosis not present

## 2024-09-17 ENCOUNTER — Encounter (HOSPITAL_COMMUNITY): Payer: Self-pay

## 2024-09-17 ENCOUNTER — Telehealth (HOSPITAL_COMMUNITY): Payer: Self-pay

## 2024-09-17 NOTE — Telephone Encounter (Addendum)
 Spoke with patient to complete pre-procedure call.     Health status review:  Any new medical conditions, recent signs of acute illness or been started on antibiotics? No Any recent hospitalizations or surgeries? No Any new medications started since pre-op visit? No  Follow all medication instructions prior to procedure or the procedure may be rescheduled:     On the morning of your procedure, no eating or drinking after midnight prior to your procedure except for sips of water with your medications. HOLD Aspirin  the morning of your procedure. Advised patient to continue taking Jardiance  prior to procedure, due to he takes medication for CHF.   The night before your procedure and the morning of your procedure, wash thoroughly with the CHG surgical soap from the neck down, paying special attention to the area where your procedure will be performed.  Pre-procedure testing scheduled: lab work on December 29.  Confirmed patient is scheduled for [employer] on Friday, January 9 with Dr. Inocencio. Instructed patient to arrive at the Main Entrance A at [facility]: [address] [place], [place] [address] and check in at Admitting at 11:30 AM.  Plan to go home the same day, you will only stay overnight if medically necessary. You MUST have a responsible adult to drive you home and MUST be with you the first 24 hours after you arrive home or your procedure could be cancelled.  Informed a nurse may call a day before the procedure to confirm arrival time and ensure instructions are followed.  Patient verbalized understanding to information provided and is agreeable to proceed with procedure.   Advised to contact RN Navigator at [phone], to inform of any new medications started after call or concerns prior to procedure.

## 2024-09-27 NOTE — Progress Notes (Unsigned)
" °  Cardiology Office Note:   Date:  09/28/2024  ID:  Alan White, DOB [birth year], MRN [id number]  Primary Cardiologist: Vinie JAYSON Maxcy, MD Electrophysiologist: Soyla Gladis Norton, MD   History of Present Illness:   Alan White is a 63 y.o. male with h/o CAD, CHF, HLD, and OSA seen today for routine electrophysiology followup.   ICD managed at [facility]. Barostim managed here.   Since last being seen in our clinic the patient reports doing well overall. Has more good days than bad days. Has some days where he doesn't want to do much; but was out in the yard last week moving limbs and etc without issues. Overall, he denies chest pain, palpitations, PND, orthopnea, nausea, vomiting, dizziness, syncope, edema, weight gain, or early satiety.  Mild DOE with moderate or more exertion.   Has noted some hypotension when he is taking his jardiance  daily.   Review of systems complete and found to be negative unless listed in HPI.    EP Information / Studies Reviewed:    EKG is ordered today. Personal review as below.  EKG Interpretation Date/Time:  Monday September 28 2024 09:52:07 EST Ventricular Rate:  73 PR Interval:  228 QRS Duration:  92 QT Interval:  372 QTC Calculation: 409 R Axis:   40  Text Interpretation: Sinus rhythm with 1st degree A-V block Confirmed by Lesia Sharper [phone]) on 09/28/2024 9:56:12 AM    Arrhythmia/Device History Abbott Dual Chamber ICD 12/17/2019 for CHF BAT Whitman) 08/2020   Barostim Interrogation-  Previous check reviewed. Pt scheduled for generator change.  ICD/PPM interrogation - Managed at [facility]    Physical Exam:   VS:  BP 112/72   Pulse 74   Ht 6' (1.829 m)   Wt 198 lb (89.8 kg)   SpO2 97%   BMI 26.85 kg/m    Wt Readings from Last 3 Encounters:  09/28/24 198 lb (89.8 kg)  06/29/24 202 lb (91.6 kg)  04/27/24 207 lb (93.9 kg)     GEN: Well nourished, well developed in no acute distress NECK: No JVD; No carotid bruits CARDIAC: Regular rate  and rhythm, no murmurs, rubs, gallops RESPIRATORY:  Clear to auscultation without rales, wheezing or rhonchi  ABDOMEN: Soft, non-tender, non-distended EXTREMITIES:  No edema; No deformity   ASSESSMENT AND PLAN:    Chronic systolic CHF s/p Abbott Defibrillator and Barostim implantation NYHA III symptoms.   EF 25-30% 06/04/2024. Will re-submit referral to HF clinic. Has more good days than bad, and does feel like Barostim has helped overall.  Barostim battery estimated to 11/22/2024, and per new [facility] protocol, Gen Change may occur in the 90 days LEADING UP to that date. Scheduled for gen change 10/09/2024 with Dr. Norton.   Explained risks, benefits, and alternatives to Barostim Gen Change, including but not limited to bleeding, infection, and lead issues.  Pt verbalized understanding and agrees to proceed.      CAD No s/s of ischemia.      Disposition:   Follow up with EP Team in as usual post procedure  Signed, Sharper Prentice Lesia, PA-C  "

## 2024-09-27 NOTE — H&P (View-Only) (Signed)
" °  Cardiology Office Note:   Date:  09/28/2024  ID:  Gurvir Schrom, DOB [birth year], MRN [id number]  Primary Cardiologist: Vinie JAYSON Maxcy, MD Electrophysiologist: Soyla Gladis Norton, MD   History of Present Illness:   Alan White is a 63 y.o. male with h/o CAD, CHF, HLD, and OSA seen today for routine electrophysiology followup.   ICD managed at [facility]. Barostim managed here.   Since last being seen in our clinic the patient reports doing well overall. Has more good days than bad days. Has some days where he doesn't want to do much; but was out in the yard last week moving limbs and etc without issues. Overall, he denies chest pain, palpitations, PND, orthopnea, nausea, vomiting, dizziness, syncope, edema, weight gain, or early satiety.  Mild DOE with moderate or more exertion.   Has noted some hypotension when he is taking his jardiance  daily.   Review of systems complete and found to be negative unless listed in HPI.    EP Information / Studies Reviewed:    EKG is ordered today. Personal review as below.  EKG Interpretation Date/Time:  Monday September 28 2024 09:52:07 EST Ventricular Rate:  73 PR Interval:  228 QRS Duration:  92 QT Interval:  372 QTC Calculation: 409 R Axis:   40  Text Interpretation: Sinus rhythm with 1st degree A-V block Confirmed by Lesia Sharper [phone]) on 09/28/2024 9:56:12 AM    Arrhythmia/Device History Abbott Dual Chamber ICD 12/17/2019 for CHF BAT Whitman) 08/2020   Barostim Interrogation-  Previous check reviewed. Pt scheduled for generator change.  ICD/PPM interrogation - Managed at [facility]    Physical Exam:   VS:  BP 112/72   Pulse 74   Ht 6' (1.829 m)   Wt 198 lb (89.8 kg)   SpO2 97%   BMI 26.85 kg/m    Wt Readings from Last 3 Encounters:  09/28/24 198 lb (89.8 kg)  06/29/24 202 lb (91.6 kg)  04/27/24 207 lb (93.9 kg)     GEN: Well nourished, well developed in no acute distress NECK: No JVD; No carotid bruits CARDIAC: Regular rate  and rhythm, no murmurs, rubs, gallops RESPIRATORY:  Clear to auscultation without rales, wheezing or rhonchi  ABDOMEN: Soft, non-tender, non-distended EXTREMITIES:  No edema; No deformity   ASSESSMENT AND PLAN:    Chronic systolic CHF s/p Abbott Defibrillator and Barostim implantation NYHA III symptoms.   EF 25-30% 06/04/2024. Will re-submit referral to HF clinic. Has more good days than bad, and does feel like Barostim has helped overall.  Barostim battery estimated to 11/22/2024, and per new [facility] protocol, Gen Change may occur in the 90 days LEADING UP to that date. Scheduled for gen change 10/09/2024 with Dr. Norton.   Explained risks, benefits, and alternatives to Barostim Gen Change, including but not limited to bleeding, infection, and lead issues.  Pt verbalized understanding and agrees to proceed.      CAD No s/s of ischemia.      Disposition:   Follow up with EP Team in as usual post procedure  Signed, Sharper Prentice Lesia, PA-C  "

## 2024-09-28 ENCOUNTER — Ambulatory Visit: Attending: Student | Admitting: Student

## 2024-09-28 ENCOUNTER — Encounter: Payer: Self-pay | Admitting: Student

## 2024-09-28 VITALS — BP 112/72 | HR 74 | Ht 72.0 in | Wt 198.0 lb

## 2024-09-28 DIAGNOSIS — I5022 Chronic systolic (congestive) heart failure: Secondary | ICD-10-CM | POA: Diagnosis not present

## 2024-09-28 DIAGNOSIS — I255 Ischemic cardiomyopathy: Secondary | ICD-10-CM | POA: Diagnosis not present

## 2024-09-28 DIAGNOSIS — I2510 Atherosclerotic heart disease of native coronary artery without angina pectoris: Secondary | ICD-10-CM

## 2024-09-28 DIAGNOSIS — Z9861 Coronary angioplasty status: Secondary | ICD-10-CM

## 2024-09-28 LAB — CBC

## 2024-09-28 NOTE — Patient Instructions (Signed)
 Medication Instructions:  No medication changes today. *If you need a refill on your cardiac medications before your next appointment, please call your pharmacy*  Lab Work: BMET and CBC today If you have labs (blood work) drawn today and your tests are completely normal, you will receive your results only by: MyChart Message (if you have MyChart) OR A paper copy in the mail If you have any lab test that is abnormal or we need to change your treatment, we will call you to review the results.  Testing/Procedures: No testing ordered today  Follow-Up: At [facility], you and your health needs are our priority.  As part of our continuing mission to provide you with exceptional heart care, our providers are all part of one team.  This team includes your primary Cardiologist (physician) and Advanced Practice Providers or APPs (Physician Assistants and Nurse Practitioners) who all work together to provide you with the care you need, when you need it.  Your next appointment:   2 weeks after your generator change  Provider:   You may see Will Gladis Norton, MD or one of the following Advanced Practice Providers on your designated Care Team:   Charlies Arthur, PA-C Jeidi Gilles Andy Chayah Mckee, PA-C Suzann Riddle, NP Daphne Barrack, NP    We recommend signing up for the patient portal called MyChart.  Sign up information is provided on this After Visit Summary.  MyChart is used to connect with patients for Virtual Visits (Telemedicine).  Patients are able to view lab/test results, encounter notes, upcoming appointments, etc.  Non-urgent messages can be sent to your provider as well.   To learn more about what you can do with MyChart, go to [email or web address].

## 2024-09-29 ENCOUNTER — Ambulatory Visit: Payer: Self-pay | Admitting: Student

## 2024-09-29 LAB — CBC
Hematocrit: 44.1 % (ref 37.5–51.0)
Hemoglobin: 14.3 g/dL (ref 13.0–17.7)
MCH: 30.4 pg (ref 26.6–33.0)
MCHC: 32.4 g/dL (ref 31.5–35.7)
MCV: 94 fL (ref 79–97)
Platelets: 208 x10E3/uL (ref 150–450)
RBC: 4.7 x10E6/uL (ref 4.14–5.80)
RDW: 12.9 % (ref 11.6–15.4)
WBC: 8.5 x10E3/uL (ref 3.4–10.8)

## 2024-09-29 LAB — BASIC METABOLIC PANEL WITH GFR
BUN/Creatinine Ratio: 12 (ref 10–24)
BUN: 14 mg/dL (ref 8–27)
CO2: 25 mmol/L (ref 20–29)
Calcium: 9.5 mg/dL (ref 8.6–10.2)
Chloride: 104 mmol/L (ref 96–106)
Creatinine, Ser: 1.17 mg/dL (ref 0.76–1.27)
Glucose: 69 mg/dL — ABNORMAL LOW (ref 70–99)
Potassium: 4 mmol/L (ref 3.5–5.2)
Sodium: 142 mmol/L (ref 134–144)
eGFR: 69 mL/min/1.73

## 2024-10-08 NOTE — Pre-Procedure Instructions (Signed)
 Attempted to call patient regarding procedure instructions.  Left voicemail on the following items: Arrival time 1130 Nothing to eat or drink after midnight No meds AM of procedure Responsible person to drive you home and stay with you for 24 hrs Wash with special soap night before and morning of procedure If on anti-coagulant drug instructions ASA- hold morning of procedure.

## 2024-10-09 ENCOUNTER — Other Ambulatory Visit: Payer: Self-pay

## 2024-10-09 ENCOUNTER — Ambulatory Visit (HOSPITAL_COMMUNITY)
Admission: RE | Admit: 2024-10-09 | Discharge: 2024-10-09 | Disposition: A | Discharge status: Home / Self Care | Attending: Cardiology | Admitting: Cardiology

## 2024-10-09 ENCOUNTER — Encounter (HOSPITAL_COMMUNITY)
Admission: RE | Disposition: A | Discharge status: Home / Self Care | Payer: Self-pay | Source: Home / Self Care | Attending: Cardiology

## 2024-10-09 HISTORY — PX: BAROREFLEX GENERATOR CHANGEOUT: EP1256

## 2024-10-09 MED ORDER — FENTANYL CITRATE (PF) 100 MCG/2ML IJ SOLN
INTRAMUSCULAR | Status: DC | PRN
  Administered 2024-10-09: 25 ug via INTRAVENOUS

## 2024-10-09 MED ORDER — FENTANYL CITRATE (PF) 100 MCG/2ML IJ SOLN
INTRAMUSCULAR | Status: AC
  Filled 2024-10-09: qty 2

## 2024-10-09 MED ORDER — ONDANSETRON HCL 4 MG/2ML IJ SOLN: 4.0000 mg | Freq: Four times a day (QID) | INTRAMUSCULAR | Status: DC | PRN

## 2024-10-09 MED ORDER — ACETAMINOPHEN 325 MG PO TABS: 325.0000 mg | ORAL_TABLET | ORAL | Status: DC | PRN

## 2024-10-09 MED ORDER — LIDOCAINE HCL 1 % IJ SOLN
INTRAMUSCULAR | Status: AC
  Filled 2024-10-09: qty 40

## 2024-10-09 MED ORDER — SODIUM CHLORIDE 0.9 % IV SOLN
INTRAVENOUS | Status: AC
  Filled 2024-10-09: qty 2

## 2024-10-09 MED ORDER — MIDAZOLAM HCL (PF) 2 MG/2ML IJ SOLN
INTRAMUSCULAR | Status: DC | PRN
  Administered 2024-10-09: 1 mg via INTRAVENOUS

## 2024-10-09 MED ORDER — CEFAZOLIN SODIUM-DEXTROSE 2-4 GM/100ML-% IV SOLN
INTRAVENOUS | Status: AC
  Filled 2024-10-09: qty 100

## 2024-10-09 MED ORDER — CEFAZOLIN SODIUM-DEXTROSE 2-4 GM/100ML-% IV SOLN
2.0000 g | INTRAVENOUS | Status: AC
  Administered 2024-10-09: 2 g via INTRAVENOUS

## 2024-10-09 MED ORDER — MIDAZOLAM HCL 2 MG/2ML IJ SOLN
INTRAMUSCULAR | Status: AC
  Filled 2024-10-09: qty 2

## 2024-10-09 MED ORDER — SODIUM CHLORIDE 0.9 % IV SOLN: INTRAVENOUS | Status: DC

## 2024-10-09 MED ORDER — LIDOCAINE HCL (PF) 1 % IJ SOLN
INTRAMUSCULAR | Status: DC | PRN
  Administered 2024-10-09: 60 mL

## 2024-10-09 MED ORDER — CHLORHEXIDINE GLUCONATE 4 % EX SOLN: 4.0000 | Freq: Once | CUTANEOUS | Status: DC

## 2024-10-09 MED ORDER — POVIDONE-IODINE 10 % EX SWAB: 2.0000 | Freq: Once | CUTANEOUS | Status: DC

## 2024-10-09 MED ORDER — SODIUM CHLORIDE 0.9 % IV SOLN
80.0000 mg | INTRAVENOUS | Status: AC
  Administered 2024-10-09: 80 mg

## 2024-10-09 NOTE — Progress Notes (Signed)
 Patient and patient wife given discharge instructions, education provided no further questions at this time. Patient able void before discharge. Able to tolerate PO intake. Patient site is clean, dry, intact with no hematoma noted upon discharge.Verified with MD Camitz when to resume Aspirin  written on paperwork and reviewed with pt and wife.

## 2024-10-09 NOTE — Discharge Instructions (Signed)

## 2024-10-09 NOTE — Interval H&P Note (Signed)
 History and Physical Interval Note:  10/09/2024 12:42 PM  Alan White  has presented today for surgery, with the diagnosis of eri.  The various methods of treatment have been discussed with the patient and family. After consideration of risks, benefits and other options for treatment, the patient has consented to  Procedures: BAROREFLEX GENERATOR CHANGEOUT (N/A) as a surgical intervention.  The patient's history has been reviewed, patient examined, no change in status, stable for surgery.  I have reviewed the patient's chart and labs.  Questions were answered to the patient's satisfaction.     Ivyanna Sibert [employer]

## 2024-10-11 ENCOUNTER — Encounter (HOSPITAL_COMMUNITY): Payer: Self-pay | Admitting: Cardiology

## 2024-10-15 ENCOUNTER — Other Ambulatory Visit: Payer: Self-pay | Admitting: Internal Medicine

## 2024-10-15 DIAGNOSIS — E782 Mixed hyperlipidemia: Secondary | ICD-10-CM

## 2024-10-15 DIAGNOSIS — I255 Ischemic cardiomyopathy: Secondary | ICD-10-CM

## 2024-10-15 DIAGNOSIS — I2510 Atherosclerotic heart disease of native coronary artery without angina pectoris: Secondary | ICD-10-CM

## 2024-10-27 ENCOUNTER — Ambulatory Visit: Admitting: *Deleted

## 2024-10-27 DIAGNOSIS — I5022 Chronic systolic (congestive) heart failure: Secondary | ICD-10-CM

## 2024-10-27 NOTE — Patient Instructions (Signed)
Patient will follow up as scheduled.

## 2024-10-27 NOTE — Progress Notes (Signed)
 Patient seen in clinic today for wound check after barostim generator change out   Wound site well healed with good approximation of incision edges  No drainage, redness, or excessive swelling noted  Minimal bruising noted around site with color change indicating active healing   Patient instructed to monitor site for next few weeks for increased swelling, redness, drainage, and fever as these are possible indicators of bleeding and or infection  Patient verbalized understanding of instructions provided by this RN  Patient will follow up as scheduled

## 2025-01-05 ENCOUNTER — Ambulatory Visit: Admitting: Student
